# Patient Record
Sex: Male | Born: 1950 | Race: White | Hispanic: No | Marital: Married | Smoking: Never smoker
Health system: Southern US, Community
[De-identification: ages and names within clinical notes are randomized; demographics above are authoritative.]

## PROBLEM LIST (undated history)

## (undated) ENCOUNTER — Emergency Department (HOSPITAL_COMMUNITY): Admission: EM | Payer: Medicare Other | Source: Home / Self Care

## (undated) DIAGNOSIS — I2510 Atherosclerotic heart disease of native coronary artery without angina pectoris: Secondary | ICD-10-CM

## (undated) DIAGNOSIS — C9000 Multiple myeloma not having achieved remission: Secondary | ICD-10-CM

## (undated) DIAGNOSIS — N400 Enlarged prostate without lower urinary tract symptoms: Secondary | ICD-10-CM

## (undated) DIAGNOSIS — I2699 Other pulmonary embolism without acute cor pulmonale: Secondary | ICD-10-CM

## (undated) DIAGNOSIS — E785 Hyperlipidemia, unspecified: Secondary | ICD-10-CM

## (undated) DIAGNOSIS — C439 Malignant melanoma of skin, unspecified: Secondary | ICD-10-CM

## (undated) HISTORY — PX: CARDIAC CATHETERIZATION: SHX172

## (undated) HISTORY — PX: HAND SURGERY: SHX662

## (undated) HISTORY — DX: Atherosclerotic heart disease of native coronary artery without angina pectoris: I25.10

---

## 2003-04-04 ENCOUNTER — Ambulatory Visit (HOSPITAL_COMMUNITY): Admission: RE | Admit: 2003-04-04 | Discharge: 2003-04-04 | Payer: Self-pay | Admitting: Internal Medicine

## 2003-04-04 ENCOUNTER — Encounter: Payer: Self-pay | Admitting: Internal Medicine

## 2006-08-29 ENCOUNTER — Encounter: Admission: RE | Admit: 2006-08-29 | Discharge: 2006-08-29 | Payer: Self-pay | Admitting: Gastroenterology

## 2008-11-15 ENCOUNTER — Ambulatory Visit: Payer: Self-pay | Admitting: Family Medicine

## 2008-11-15 DIAGNOSIS — Z85828 Personal history of other malignant neoplasm of skin: Secondary | ICD-10-CM

## 2008-11-17 ENCOUNTER — Ambulatory Visit: Payer: Self-pay | Admitting: Internal Medicine

## 2008-11-24 ENCOUNTER — Ambulatory Visit: Payer: Self-pay | Admitting: Internal Medicine

## 2008-11-24 LAB — HM COLONOSCOPY: HM Colonoscopy: NORMAL

## 2010-12-30 LAB — CONVERTED CEMR LAB
AST: 42 units/L — ABNORMAL HIGH (ref 0–37)
Alkaline Phosphatase: 78 units/L (ref 39–117)
Basophils Absolute: 0.1 10*3/uL (ref 0.0–0.1)
Bilirubin, Direct: 0.1 mg/dL (ref 0.0–0.3)
Blood in Urine, dipstick: NEGATIVE
CO2: 31 meq/L (ref 19–32)
Chloride: 105 meq/L (ref 96–112)
Eosinophils Absolute: 0.1 10*3/uL (ref 0.0–0.7)
Eosinophils Relative: 1.3 % (ref 0.0–5.0)
GFR calc non Af Amer: 73 mL/min
Glucose, Urine, Semiquant: NEGATIVE
HDL: 37.3 mg/dL — ABNORMAL LOW (ref >39.0)
LDL Cholesterol: 117 mg/dL — ABNORMAL HIGH (ref 0–99)
Lymphocytes Relative: 27.2 % (ref 12.0–46.0)
MCHC: 34.7 g/dL (ref 30.0–36.0)
MCV: 92.6 fL (ref 78.0–100.0)
Neutrophils Relative %: 61.9 % (ref 43.0–77.0)
Platelets: 234 10*3/uL (ref 150–400)
Potassium: 3.7 meq/L (ref 3.5–5.1)
Protein, U semiquant: NEGATIVE
Total Bilirubin: 1 mg/dL (ref 0.3–1.2)
Urobilinogen, UA: 0.2
VLDL: 38 mg/dL (ref 0–40)
WBC Urine, dipstick: NEGATIVE
WBC: 7.5 10*3/uL (ref 4.5–10.5)
pH: 5.5

## 2011-12-03 DIAGNOSIS — K922 Gastrointestinal hemorrhage, unspecified: Secondary | ICD-10-CM

## 2011-12-03 HISTORY — DX: Gastrointestinal hemorrhage, unspecified: K92.2

## 2012-09-23 ENCOUNTER — Emergency Department (HOSPITAL_COMMUNITY): Payer: BC Managed Care – PPO

## 2012-09-23 ENCOUNTER — Encounter (HOSPITAL_COMMUNITY): Payer: Self-pay | Admitting: Emergency Medicine

## 2012-09-23 ENCOUNTER — Inpatient Hospital Stay (HOSPITAL_COMMUNITY)
Admission: EM | Admit: 2012-09-23 | Discharge: 2012-09-26 | DRG: 175 | Disposition: A | Discharge status: Home / Self Care | Payer: BC Managed Care – PPO | Attending: Internal Medicine | Admitting: Internal Medicine

## 2012-09-23 DIAGNOSIS — Z7982 Long term (current) use of aspirin: Secondary | ICD-10-CM

## 2012-09-23 DIAGNOSIS — K449 Diaphragmatic hernia without obstruction or gangrene: Secondary | ICD-10-CM | POA: Diagnosis present

## 2012-09-23 DIAGNOSIS — Z85828 Personal history of other malignant neoplasm of skin: Secondary | ICD-10-CM

## 2012-09-23 DIAGNOSIS — R42 Dizziness and giddiness: Secondary | ICD-10-CM | POA: Diagnosis present

## 2012-09-23 DIAGNOSIS — A048 Other specified bacterial intestinal infections: Secondary | ICD-10-CM | POA: Diagnosis present

## 2012-09-23 DIAGNOSIS — Z720 Tobacco use: Secondary | ICD-10-CM

## 2012-09-23 DIAGNOSIS — K254 Chronic or unspecified gastric ulcer with hemorrhage: Principal | ICD-10-CM | POA: Diagnosis present

## 2012-09-23 DIAGNOSIS — F17200 Nicotine dependence, unspecified, uncomplicated: Secondary | ICD-10-CM | POA: Diagnosis present

## 2012-09-23 DIAGNOSIS — R7309 Other abnormal glucose: Secondary | ICD-10-CM | POA: Diagnosis present

## 2012-09-23 DIAGNOSIS — Z23 Encounter for immunization: Secondary | ICD-10-CM

## 2012-09-23 DIAGNOSIS — K922 Gastrointestinal hemorrhage, unspecified: Secondary | ICD-10-CM

## 2012-09-23 DIAGNOSIS — D62 Acute posthemorrhagic anemia: Secondary | ICD-10-CM | POA: Diagnosis present

## 2012-09-23 DIAGNOSIS — Z85820 Personal history of malignant melanoma of skin: Secondary | ICD-10-CM

## 2012-09-23 DIAGNOSIS — D649 Anemia, unspecified: Secondary | ICD-10-CM | POA: Diagnosis not present

## 2012-09-23 HISTORY — DX: Malignant melanoma of skin, unspecified: C43.9

## 2012-09-23 LAB — CBC WITH DIFFERENTIAL/PLATELET
Eosinophils Absolute: 0 10*3/uL (ref 0.0–0.7)
Eosinophils Relative: 0 % (ref 0–5)
Hemoglobin: 12.5 g/dL — ABNORMAL LOW (ref 13.0–17.0)
Lymphocytes Relative: 13 % (ref 12–46)
Lymphs Abs: 2.3 10*3/uL (ref 0.7–4.0)
MCH: 30 pg (ref 26.0–34.0)
MCV: 88.7 fL (ref 78.0–100.0)
Monocytes Relative: 7 % (ref 3–12)
Neutrophils Relative %: 80 % — ABNORMAL HIGH (ref 43–77)
RBC: 4.17 MIL/uL — ABNORMAL LOW (ref 4.22–5.81)
WBC: 17.8 10*3/uL — ABNORMAL HIGH (ref 4.0–10.5)

## 2012-09-23 LAB — PROTIME-INR: INR: 1.15 (ref 0.00–1.49)

## 2012-09-23 MED ORDER — SODIUM CHLORIDE 0.9 % IV BOLUS (SEPSIS)
1000.0000 mL | Freq: Once | INTRAVENOUS | Status: AC
  Administered 2012-09-23: 1000 mL via INTRAVENOUS

## 2012-09-23 MED ORDER — SODIUM CHLORIDE 0.9 % IV SOLN
80.0000 mg | Freq: Once | INTRAVENOUS | Status: AC
  Administered 2012-09-24: 80 mg via INTRAVENOUS
  Filled 2012-09-23: qty 80

## 2012-09-23 NOTE — ED Notes (Signed)
PT. REPORTS BLOODY EMESIS YESTERDAY AND DARK STOOLS TODAY / PALE , DENIES ABDOMINAL PAIN .

## 2012-09-23 NOTE — ED Notes (Signed)
Dr. Verl Bangs at bedside assessing patient.

## 2012-09-23 NOTE — ED Notes (Signed)
Waiting on Protonix to be delivered from pharmacy.

## 2012-09-23 NOTE — ED Provider Notes (Addendum)
History     CSN: [id number]  Arrival date & time 09/23/12  2303   First MD Initiated Contact with Patient 09/23/12 2314      Chief Complaint  Patient presents with  . Hematemesis    (Consider location/radiation/quality/duration/timing/severity/associated sxs/prior treatment) Patient is a 61 y.o. male presenting with GI illness and vomiting.  GI Problem  This is a new problem. The current episode started yesterday. The problem occurs 2 to 4 times per day. The problem has not changed since onset.There has been no fever. Associated symptoms include abdominal pain and vomiting. He has tried nothing for the symptoms.  Emesis  This is a new problem. The current episode started yesterday. The problem occurs 2 to 4 times per day. Vomiting appearance: black,  coffee grounds. There has been no fever. Associated symptoms include abdominal pain. Associated symptoms comments: Black tarry stool.    History reviewed. No pertinent past medical history.  History reviewed. No pertinent past surgical history.  No family history on file.  History  Substance Use Topics  . Smoking status: Never Smoker   . Smokeless tobacco: Not on file  . Alcohol Use: No      Review of Systems  Gastrointestinal: Positive for vomiting, abdominal pain and blood in stool.  Neurological: Positive for dizziness and light-headedness.  All other systems reviewed and are negative.    Allergies  Review of patient's allergies indicates no known allergies.  Home Medications  No current outpatient prescriptions on file.  BP 100/60  Pulse 91  Resp 18  SpO2 94%  Physical Exam  Constitutional: He is oriented to person, place, and time. He appears well-developed and well-nourished.  HENT:  Head: Normocephalic and atraumatic.       Coffee grounds noted in mouth  Eyes: Pupils are equal, round, and reactive to light.       Conjunctiva pale  Neck: Normal range of motion. Neck supple.  Cardiovascular: Regular  rhythm, normal heart sounds and intact distal pulses.  Tachycardia present.   Pulmonary/Chest: Effort normal and breath sounds normal.  Abdominal: Soft. Bowel sounds are normal.  Neurological: He is alert and oriented to person, place, and time.  Skin: Skin is warm and dry.  Psychiatric: He has a normal mood and affect. His behavior is normal. Judgment and thought content normal.    ED Course  Procedures (including critical care time)   Labs Reviewed  TYPE AND SCREEN  CBC WITH DIFFERENTIAL  COMPREHENSIVE METABOLIC PANEL  TROPONIN I  APTT  PROTIME-INR   No results found.   No diagnosis found.   Date: 09/24/2012  Rate: 87  Rhythm: normal sinus rhythm  QRS Axis: normal  Intervals: normal  ST/T Wave abnormalities: nonspecific ST changes  Conduction Disutrbances:none  Narrative Interpretation:   Old EKG Reviewed: none available    MDM  Pt tachycardic ,  Hypotensive.  Suspect upper gi bleed.  Will lab,  reassess   bp improved.  Some prerenal azotemia.  No abd pain.  No free air on cxr.  Discussed with hospitalist,  Will admit     Rosanne Ashing, MD 09/23/12 2332  Elric Tirado Lytle Michaels, MD 09/24/12 4098

## 2012-09-24 ENCOUNTER — Encounter (HOSPITAL_COMMUNITY): Payer: Self-pay | Admitting: Internal Medicine

## 2012-09-24 ENCOUNTER — Encounter (HOSPITAL_COMMUNITY)
Admission: EM | Disposition: A | Discharge status: Home / Self Care | Payer: Self-pay | Source: Home / Self Care | Attending: Internal Medicine

## 2012-09-24 DIAGNOSIS — K922 Gastrointestinal hemorrhage, unspecified: Secondary | ICD-10-CM

## 2012-09-24 DIAGNOSIS — D649 Anemia, unspecified: Secondary | ICD-10-CM | POA: Diagnosis not present

## 2012-09-24 DIAGNOSIS — Z720 Tobacco use: Secondary | ICD-10-CM | POA: Diagnosis present

## 2012-09-24 DIAGNOSIS — F17200 Nicotine dependence, unspecified, uncomplicated: Secondary | ICD-10-CM

## 2012-09-24 HISTORY — PX: ESOPHAGOGASTRODUODENOSCOPY: SHX5428

## 2012-09-24 LAB — CBC
HCT: 34 % — ABNORMAL LOW (ref 39.0–52.0)
Hemoglobin: 11.4 g/dL — ABNORMAL LOW (ref 13.0–17.0)
MCH: 30 pg (ref 26.0–34.0)
MCH: 29.9 pg (ref 26.0–34.0)
MCH: 30.7 pg (ref 26.0–34.0)
MCHC: 33.5 g/dL (ref 30.0–36.0)
MCHC: 33.5 g/dL (ref 30.0–36.0)
MCHC: 34 g/dL (ref 30.0–36.0)
MCV: 89.5 fL (ref 78.0–100.0)
MCV: 89.3 fL (ref 78.0–100.0)
Platelets: 206 10*3/uL (ref 150–400)
Platelets: 208 10*3/uL (ref 150–400)
Platelets: 201 10*3/uL (ref 150–400)
RBC: 3.8 MIL/uL — ABNORMAL LOW (ref 4.22–5.81)
RBC: 3.62 MIL/uL — ABNORMAL LOW (ref 4.22–5.81)
RDW: 13.3 % (ref 11.5–15.5)
RDW: 13.3 % (ref 11.5–15.5)
RDW: 13.4 % (ref 11.5–15.5)
WBC: 15.2 10*3/uL — ABNORMAL HIGH (ref 4.0–10.5)
WBC: 18 10*3/uL — ABNORMAL HIGH (ref 4.0–10.5)

## 2012-09-24 LAB — COMPREHENSIVE METABOLIC PANEL
ALT: 19 U/L (ref 0–53)
ALT: 17 U/L (ref 0–53)
AST: 15 U/L (ref 0–37)
Albumin: 2.9 g/dL — ABNORMAL LOW (ref 3.5–5.2)
Alkaline Phosphatase: 76 U/L (ref 39–117)
BUN: 32 mg/dL — ABNORMAL HIGH (ref 6–23)
CO2: 25 mEq/L (ref 19–32)
CO2: 24 mEq/L (ref 19–32)
Chloride: 109 mEq/L (ref 96–112)
GFR calc Af Amer: 82 mL/min — ABNORMAL LOW (ref >90)
GFR calc non Af Amer: 70 mL/min — ABNORMAL LOW (ref >90)
GFR calc non Af Amer: 88 mL/min — ABNORMAL LOW (ref >90)
Glucose, Bld: 269 mg/dL — ABNORMAL HIGH (ref 70–99)
Potassium: 4.1 mEq/L (ref 3.5–5.1)
Sodium: 142 mEq/L (ref 135–145)
Total Bilirubin: 0.4 mg/dL (ref 0.3–1.2)
Total Protein: 5.9 g/dL — ABNORMAL LOW (ref 6.0–8.3)

## 2012-09-24 LAB — TROPONIN I: Troponin I: <0.3 ng/mL (ref <0.30)

## 2012-09-24 LAB — TYPE AND SCREEN: Antibody Screen: NEGATIVE

## 2012-09-24 LAB — HEMOGLOBIN A1C: Hgb A1c MFr Bld: 5.3 % (ref <5.7)

## 2012-09-24 LAB — PROTIME-INR: INR: 1.16 (ref 0.00–1.49)

## 2012-09-24 SURGERY — EGD (ESOPHAGOGASTRODUODENOSCOPY)
Anesthesia: Moderate Sedation

## 2012-09-24 MED ORDER — SODIUM CHLORIDE 0.9 % IJ SOLN
3.0000 mL | Freq: Two times a day (BID) | INTRAMUSCULAR | Status: DC
  Administered 2012-09-24 – 2012-09-25 (×3): 3 mL via INTRAVENOUS

## 2012-09-24 MED ORDER — SODIUM CHLORIDE 0.9 % IJ SOLN
INTRAMUSCULAR | Status: DC | PRN
  Administered 2012-09-24: 13:00:00

## 2012-09-24 MED ORDER — FENTANYL CITRATE 0.05 MG/ML IJ SOLN
INTRAMUSCULAR | Status: DC | PRN
  Administered 2012-09-24 (×3): 25 ug via INTRAVENOUS

## 2012-09-24 MED ORDER — SODIUM CHLORIDE 0.9 % IV SOLN
INTRAVENOUS | Status: DC
  Administered 2012-09-24 – 2012-09-25 (×3): via INTRAVENOUS

## 2012-09-24 MED ORDER — MIDAZOLAM HCL 10 MG/2ML IJ SOLN
INTRAMUSCULAR | Status: DC | PRN
  Administered 2012-09-24 (×2): 2 mg via INTRAVENOUS
  Administered 2012-09-24: 1 mg via INTRAVENOUS

## 2012-09-24 MED ORDER — ONDANSETRON HCL 4 MG/2ML IJ SOLN: 4.0000 mg | Freq: Three times a day (TID) | INTRAMUSCULAR | Status: DC | PRN

## 2012-09-24 MED ORDER — INFLUENZA VIRUS VACC SPLIT PF IM SUSP
0.5000 mL | INTRAMUSCULAR | Status: AC
  Administered 2012-09-25: 0.5 mL via INTRAMUSCULAR
  Filled 2012-09-24: qty 0.5

## 2012-09-24 MED ORDER — ONDANSETRON HCL 4 MG/2ML IJ SOLN: 4.0000 mg | Freq: Four times a day (QID) | INTRAMUSCULAR | Status: DC | PRN

## 2012-09-24 MED ORDER — EPINEPHRINE HCL 0.1 MG/ML IJ SOLN
INTRAMUSCULAR | Status: AC
  Filled 2012-09-24: qty 10

## 2012-09-24 MED ORDER — BUTAMBEN-TETRACAINE-BENZOCAINE 2-2-14 % EX AERO
INHALATION_SPRAY | CUTANEOUS | Status: DC | PRN
  Administered 2012-09-24: 1 via TOPICAL

## 2012-09-24 MED ORDER — DIPHENHYDRAMINE HCL 50 MG/ML IJ SOLN
INTRAMUSCULAR | Status: DC | PRN
  Administered 2012-09-24: 12.5 mg via INTRAVENOUS
  Administered 2012-09-24: 25 mg via INTRAVENOUS

## 2012-09-24 MED ORDER — ACETAMINOPHEN 325 MG PO TABS: 650.0000 mg | ORAL_TABLET | Freq: Four times a day (QID) | ORAL | Status: DC | PRN

## 2012-09-24 MED ORDER — ACETAMINOPHEN 650 MG RE SUPP: 650.0000 mg | Freq: Four times a day (QID) | RECTAL | Status: DC | PRN

## 2012-09-24 MED ORDER — MIDAZOLAM HCL 5 MG/ML IJ SOLN
INTRAMUSCULAR | Status: AC
  Filled 2012-09-24: qty 2

## 2012-09-24 MED ORDER — ONDANSETRON HCL 4 MG PO TABS: 4.0000 mg | ORAL_TABLET | Freq: Four times a day (QID) | ORAL | Status: DC | PRN

## 2012-09-24 MED ORDER — FENTANYL CITRATE 0.05 MG/ML IJ SOLN
INTRAMUSCULAR | Status: AC
  Filled 2012-09-24: qty 2

## 2012-09-24 MED ORDER — DIPHENHYDRAMINE HCL 50 MG/ML IJ SOLN
INTRAMUSCULAR | Status: AC
  Filled 2012-09-24: qty 1

## 2012-09-24 MED ORDER — SODIUM CHLORIDE 0.9 % IV SOLN
8.0000 mg/h | INTRAVENOUS | Status: DC
  Administered 2012-09-24 – 2012-09-26 (×4): 8 mg/h via INTRAVENOUS
  Filled 2012-09-24 (×12): qty 80

## 2012-09-24 MED ORDER — SODIUM CHLORIDE 0.9 % IV SOLN: INTRAVENOUS | Status: DC

## 2012-09-24 NOTE — ED Notes (Signed)
Dr. Toniann Fail at bedside assessing patient.

## 2012-09-24 NOTE — Interval H&P Note (Signed)
History and Physical Interval Note:  09/24/2012 12:37 PM  John Mccarthy  has presented today for surgery, with the diagnosis of gi bleed  The various methods of treatment have been discussed with the patient and family. After consideration of risks, benefits and other options for treatment, the patient has consented to  Procedure(s) (LRB) with comments: ESOPHAGOGASTRODUODENOSCOPY (EGD) (N/A) as a surgical intervention .  The patient's history has been reviewed, patient examined, no change in status, stable for surgery.  I have reviewed the patient's chart and labs.  Questions were answered to the patient's satisfaction.     Genell Thede C.

## 2012-09-24 NOTE — Brief Op Note (Addendum)
Large Antral Ulcer. See endopro for details. NG removed and would not reinsert. Continue Protonix infusion. Clear liquids.

## 2012-09-24 NOTE — H&P (Addendum)
John Mccarthy is an 61 y.o. male.  Patient was seen and examined on September 24, 2012. PCP - Dr. Kelle Darting.  Chief Complaint: Vomiting blood. HPI: 61 year-old male with history of melanoma and tobacco abuse presented to the ER because of vomiting of blood. Patient started developing some epigastric discomfort last evening around 8 PM while at home after which he threw up and it was coffee-ground. He came to the ER and in the ER NG tube suction shows frank blood. Patient also had a melanotic bowel movement in the ER. Denies any abdominal pain at this time. Patient has been admitted for further management of his acute GI bleed. Patient denies using any NSAIDs or over-the-counter medications. Denies having GI bleed previously but has had colonoscopy and EGD 3-4 years ago for a routine checkup. Patient otherwise denies any chest pain or shortness of breath.  Patient initially was mildly hypotensive and blood pressure improved with fluids.  Past Medical History  Diagnosis Date  . Melanoma     Past Surgical History  Procedure Date  . Hand surgery     Family History  Problem Relation Age of Onset  . Heart failure Father    Social History:  reports that he has been smoking.  He does not have any smokeless tobacco history on file. He reports that he drinks alcohol. He reports that he does not use illicit drugs.  Allergies: No Known Allergies   (Not in a hospital admission)  Results for orders placed during the hospital encounter of 09/23/12 (from the past 48 hour(s))  CBC WITH DIFFERENTIAL     Status: Abnormal   Collection Time   09/23/12 11:24 PM      Component Value Range Comment   WBC 17.8 (*) 4.0 - 10.5 K/uL    RBC 4.17 (*) 4.22 - 5.81 MIL/uL    Hemoglobin 12.5 (*) 13.0 - 17.0 g/dL    HCT 40.9 (*) 81.1 - 52.0 %    MCV 88.7  78.0 - 100.0 fL    MCH 30.0  26.0 - 34.0 pg    MCHC 33.8  30.0 - 36.0 g/dL    RDW 91.4  78.2 - 95.6 %    Platelets 242  150 - 400 K/uL    Neutrophils  Relative 80 (*) 43 - 77 %    Neutro Abs 14.2 (*) 1.7 - 7.7 K/uL    Lymphocytes Relative 13  12 - 46 %    Lymphs Abs 2.3  0.7 - 4.0 K/uL    Monocytes Relative 7  3 - 12 %    Monocytes Absolute 1.2 (*) 0.1 - 1.0 K/uL    Eosinophils Relative 0  0 - 5 %    Eosinophils Absolute 0.0  0.0 - 0.7 K/uL    Basophils Relative 0  0 - 1 %    Basophils Absolute 0.0  0.0 - 0.1 K/uL   COMPREHENSIVE METABOLIC PANEL     Status: Abnormal   Collection Time   09/23/12 11:24 PM      Component Value Range Comment   Sodium 141  135 - 145 mEq/L    Potassium 4.1  3.5 - 5.1 mEq/L    Chloride 101  96 - 112 mEq/L    CO2 25  19 - 32 mEq/L    Glucose, Bld 269 (*) 70 - 99 mg/dL    BUN 32 (*) 6 - 23 mg/dL    Creatinine, Ser 2.13  0.50 - 1.35 mg/dL  Calcium 8.5  8.4 - 10.5 mg/dL    Total Protein 5.9 (*) 6.0 - 8.3 g/dL    Albumin 3.1 (*) 3.5 - 5.2 g/dL    AST 19  0 - 37 U/L    ALT 19  0 - 53 U/L    Alkaline Phosphatase 76  39 - 117 U/L    Total Bilirubin 0.3  0.3 - 1.2 mg/dL    GFR calc non Af Amer 70 (*) >90 mL/min    GFR calc Af Amer 82 (*) >90 mL/min   TROPONIN I     Status: Normal   Collection Time   09/23/12 11:25 PM      Component Value Range Comment   Troponin I <0.30  <0.30 ng/mL   APTT     Status: Normal   Collection Time   09/23/12 11:25 PM      Component Value Range Comment   aPTT 24  24 - 37 seconds   PROTIME-INR     Status: Normal   Collection Time   09/23/12 11:25 PM      Component Value Range Comment   Prothrombin Time 14.5  11.6 - 15.2 seconds    INR 1.15  0.00 - 1.49   TYPE AND SCREEN     Status: Normal   Collection Time   09/23/12 11:30 PM      Component Value Range Comment   ABO/RH(D) A POS      Antibody Screen NEG      Sample Expiration 09/26/2012     ABO/RH     Status: Normal   Collection Time   09/23/12 11:30 PM      Component Value Range Comment   ABO/RH(D) A POS     CBC     Status: Abnormal   Collection Time   09/24/12  1:08 AM      Component Value Range Comment    WBC 17.7 (*) 4.0 - 10.5 K/uL    RBC 3.80 (*) 4.22 - 5.81 MIL/uL    Hemoglobin 11.4 (*) 13.0 - 17.0 g/dL    HCT 16.1 (*) 09.6 - 52.0 %    MCV 89.5  78.0 - 100.0 fL    MCH 30.0  26.0 - 34.0 pg    MCHC 33.5  30.0 - 36.0 g/dL    RDW 04.5  40.9 - 81.1 %    Platelets 201  150 - 400 K/uL    Dg Chest Port 1 View  09/24/2012  *RADIOLOGY REPORT*  Clinical Data: Hematemesis.  PORTABLE CHEST - 1 VIEW  Comparison: None.  Findings: Single view of the chest demonstrates elevation of the right hemidiaphragm.  There may be right basilar atelectasis. Upper lungs are clear.  Heart and mediastinum are within normal limits.  Trachea is midline.  IMPRESSION: Elevation of the right hemidiaphragm.  No focal airspace disease.   Original Report Authenticated By: Richarda Overlie, M.D.     Review of Systems  Constitutional: Negative.   HENT: Negative.   Eyes: Negative.   Respiratory: Negative.   Cardiovascular: Negative.   Gastrointestinal: Positive for vomiting and abdominal pain.       Throwing up blood.  Genitourinary: Negative.   Musculoskeletal: Negative.   Skin: Negative.   Neurological: Negative.   Endo/Heme/Allergies: Negative.   Psychiatric/Behavioral: Negative.     Blood pressure 111/65, pulse 97, temperature 98.8 F (37.1 C), temperature source Oral, resp. rate 20, SpO2 98.00%. Physical Exam  Constitutional: He is oriented to person, place, and time. He  appears well-developed and well-nourished. No distress.  HENT:  Head: Normocephalic and atraumatic.  Right Ear: External ear normal.  Left Ear: External ear normal.  Nose: Nose normal.  Mouth/Throat: Oropharynx is clear and moist. No oropharyngeal exudate.  Eyes: Conjunctivae normal are normal. Pupils are equal, round, and reactive to light. Right eye exhibits no discharge. Left eye exhibits no discharge. No scleral icterus.  Neck: Normal range of motion. Neck supple.  Cardiovascular: Normal rate and regular rhythm.   Respiratory: Effort normal  and breath sounds normal. No respiratory distress. He has no wheezes. He has no rales.  GI: Soft. Bowel sounds are normal. He exhibits no distension. There is no tenderness. There is no rebound and no guarding.  Musculoskeletal: Normal range of motion. He exhibits no edema and no tenderness.  Neurological: He is alert and oriented to person, place, and time.       Moves all extremities.  Skin: Skin is warm and dry. He is not diaphoretic.     Assessment/Plan #1. Acute GI bleed - given the symptoms it is mostly upper GI. Patient will be kept n.p.o. and continued on Protonix infusion. Repeat hemoglobin only shows 1 g drop. Closely follow CBC and transfuse as needed. Consult GI. #2. Leukocytosis - patient is afebrile. Closely follow CBC. Leukocytosis probably from stress. #3. Hyperglycemia - check hemoglobin A1c. #4. Tobacco abuse - tobacco cessation counseling requested.  CODE STATUS - full code.  Nasiir Monts N. 09/24/2012, 1:28 AM

## 2012-09-24 NOTE — H&P (View-Only) (Signed)
[facility] PROGRESS NOTE  John Mccarthy EXB:[id number] DOB: [birth year] DOA: 09/23/2012 PCP: Evette Georges, MD  Assessment/Plan: Principal Problem:  *Acute GI bleeding    1. Acute GI bleed: Patient presented with hematemesis/melena, a few hours after feeling epigastric discomfort. Clinically, this is consistent wiith upper GI bleed, with peptic ulcer disease being high on the list of etiologies, although other lesions are possible. Patient is NPO, following serial CBCs, on iv Protonix. Dr Charlott Rakes provided GI consultation, and EGD is planned this AM.  2. Anemia: This is due to acute blood loss, although it may be partly dilutional, due to iv fluids. Patient's hemoglobin was 12.5 at presentation, and is 11.2 this AM. Following CBCs as described above, and will transfuse PRN.  3. Leukocytosis: Likely reactive. Patient is afebrile and has no foci of infection.  4. Hyperglycemia: random glucose was 269 at presentation, but has already normalized at 116 this AM. . Patient has no known history of DM Suspect stress reaction. Will follow CBGs. Hemoglobin A1C is pending.  5. Tobacco abuse: Counseled.    Code Status: Full Code.  Family Communication:  Disposition Plan: To be determined.    Brief narrative: 61 year-old male with history of melanoma and tobacco abuse presenting with hematemesis. Patient developed epigastric discomfort at around 8:00 PM on 09/13/12 at home, after which he threw up and it was coffee-grounds. He came to the ED, where NG tube suction revealed frank blood. Patient also had a melenic bowel movement in the ED. Denied abdominal pain or NSAIDs use. Denies having GI bleed previously but has had colonoscopy and EGD 3-4 years ago for a routine checkup.   Consultants:  Dr Charlott Rakes, GI.   Procedures:  EGD.   Antibiotics:  N/A.   HPI/Subjective: Asymptomatic.   Objective: Vital signs in last 24 hours: Temp:  [98.2 F (36.8 C)-98.8  F (37.1 C)] 98.5 F (36.9 C) (10/24 0736) Pulse Rate:  [89-102] 94  (10/24 0800) Resp:  [17-28] 17  (10/24 0800) BP: (83-133)/(41-68) 127/61 mmHg (10/24 0800) SpO2:  [94 %-99 %] 95 % (10/24 0800) Weight:  [95.3 kg (210 lb 1.6 oz)] 95.3 kg (210 lb 1.6 oz) (10/24 0300) Weight change:     Intake/Output from previous day: 10/23 0701 - 10/24 0700 In: 711.3 [I.V.:711.3] Out: 251 [Urine:1; Emesis/NG output:250] Total I/O In: 150 [I.V.:150] Out: -    Physical Exam: General: Comfortable, alert, communicative, fully oriented, not short of breath at rest.  HEENT:  Mild clinical pallor, no jaundice, no conjunctival injection or discharge. Hydration is fair.  NECK:  Supple, JVP not seen, no carotid bruits, no palpable lymphadenopathy, no palpable goiter. CHEST:  Clinically clear to auscultation, no wheezes, no crackles. HEART:  Sounds 1 and 2 heard, normal, regular, no murmurs. ABDOMEN:  Full, soft, non-tender, no palpable organomegaly, no palpable masses, normal bowel sounds. GENITALIA:  Not examined. LOWER EXTREMITIES:  No pitting edema, palpable peripheral pulses. MUSCULOSKELETAL SYSTEM:  Unremarkable.  CENTRAL NERVOUS SYSTEM:  No focal neurologic deficit on gross examination.  Lab Results:  Basename 09/24/12 0600 09/24/12 0108  WBC 15.2* 17.7*  HGB 11.2* 11.4*  HCT 33.4* 34.0*  PLT 206 201    Basename 09/24/12 0600 09/23/12 2324  NA 142 141  K 4.2 4.1  CL 109 101  CO2 24 25  GLUCOSE 116* 269*  BUN 38* 32*  CREATININE 0.93 1.09  CALCIUM 7.9* 8.5   Recent Results (from the past 240 hour(s))  MRSA PCR SCREENING  Status: Normal   Collection Time   09/24/12  2:08 AM      Component Value Range Status Comment   MRSA by PCR NEGATIVE  NEGATIVE Final      Studies/Results: Dg Chest Port 1 View  09/24/2012  *RADIOLOGY REPORT*  Clinical Data: Hematemesis.  PORTABLE CHEST - 1 VIEW  Comparison: None.  Findings: Single view of the chest demonstrates elevation of the right  hemidiaphragm.  There may be right basilar atelectasis. Upper lungs are clear.  Heart and mediastinum are within normal limits.  Trachea is midline.  IMPRESSION: Elevation of the right hemidiaphragm.  No focal airspace disease.   Original Report Authenticated By: Richarda Overlie, M.D.     Medications: Scheduled Meds:   . pantoprazole (PROTONIX) IV  80 mg Intravenous Once  . sodium chloride  1,000 mL Intravenous Once  . sodium chloride  3 mL Intravenous Q12H  . DISCONTD: sodium chloride   Intravenous STAT   Continuous Infusions:   . sodium chloride 125 mL/hr at 09/24/12 0700  . pantoprozole (PROTONIX) infusion 8 mg/hr (09/24/12 0800)   PRN Meds:.acetaminophen, acetaminophen, ondansetron (ZOFRAN) IV, ondansetron, DISCONTD: ondansetron (ZOFRAN) IV    LOS: 1 day   Alishia Lebo,CHRISTOPHER  [facility] Pager [phone]. If 8PM-8AM, please contact night-coverage at [email or web address], password [email or web address] 09/24/2012, 9:07 AM  LOS: 1 day

## 2012-09-24 NOTE — Consult Note (Signed)
Referring Provider: Dr. Toniann Fail Primary Care Physician:  Evette Georges, MD Primary Gastroenterologist:  Dr. Bosie Clos  Reason for Consultation:  Hematemesis  HPI: John Mccarthy is a 61 y.o. male reports 2 days of an upset stomach followed by 3 episodes of dark coffee grounds emesis yesterday and one episode of melena. Felt dizzy and lightheaded at home. Denies bright red blood emesis. Rare aspirin and otherwise denies NSAIDs. Occasional alcohol during special occasions and denies any recently. No history of ulcers. EGD in 2007 unremarkable (by me). Diverticulosis on colonoscopy by Dr. Juanda Chance in 2009. Denies dizziness walking to bathroom after admit. Hgb 11.2. Approximately 250cc from NG tube since admit to room of black fluid. 200cc-300cc out in ER of blood (?appearance).    Past Medical History  Diagnosis Date  . Melanoma     Past Surgical History  Procedure Date  . Hand surgery     Prior to Admission medications   Medication Sig Start Date End Date Taking? Authorizing Provider  aspirin 325 MG tablet Take 325 mg by mouth daily as needed. For pain   Yes Historical Provider, MD    Scheduled Meds:   . pantoprazole (PROTONIX) IV  80 mg Intravenous Once  . sodium chloride  1,000 mL Intravenous Once  . sodium chloride  3 mL Intravenous Q12H  . DISCONTD: sodium chloride   Intravenous STAT   Continuous Infusions:   . sodium chloride 125 mL/hr at 09/24/12 0700  . pantoprozole (PROTONIX) infusion 8 mg/hr (09/24/12 0800)   PRN Meds:.acetaminophen, acetaminophen, ondansetron (ZOFRAN) IV, ondansetron, DISCONTD: ondansetron (ZOFRAN) IV  Allergies as of 09/23/2012  . (No Known Allergies)    Family History  Problem Relation Age of Onset  . Heart failure Father     History   Social History  . Marital Status: Married    Spouse Name: N/A    Number of Children: N/A  . Years of Education: N/A   Occupational History  . Not on file.   Social History Main Topics  .  Smoking status: Current Some Day Smoker  . Smokeless tobacco: Not on file  . Alcohol Use: Yes  . Drug Use: No  . Sexually Active:    Other Topics Concern  . Not on file   Social History Narrative  . No narrative on file    Review of Systems: All negative except as stated above in HPI.  Physical Exam: Vital signs: Filed Vitals:   09/24/12 0800  BP: 127/61  Pulse: 94  Temp: 98.5  Resp: 17     General:   Alert,  Well-developed, well-nourished, pleasant and cooperative in NAD, NG with wall suction HEENT: no icterus Neck: supple, nontender Lungs:  Clear throughout to auscultation.   No wheezes, crackles, or rhonchi. No acute distress. Heart:  Regular rate and rhythm; no murmurs, clicks, rubs,  or gallops. Abdomen: soft, NT, ND  Rectal:  Deferred Ext: no edema  GI:  Lab Results:  Basename 09/24/12 0600 09/24/12 0108 09/23/12 2324  WBC 15.2* 17.7* 17.8*  HGB 11.2* 11.4* 12.5*  HCT 33.4* 34.0* 37.0*  PLT 206 201 242   BMET  Basename 09/24/12 0600 09/23/12 2324  NA 142 141  K 4.2 4.1  CL 109 101  CO2 24 25  GLUCOSE 116* 269*  BUN 38* 32*  CREATININE 0.93 1.09  CALCIUM 7.9* 8.5   LFT  Basename 09/24/12 0600  PROT 5.5*  ALBUMIN 2.9*  AST 15  ALT 17  ALKPHOS 68  BILITOT 0.4  BILIDIR --  IBILI --   PT/INR  Basename 09/24/12 0600 09/23/12 2325  LABPROT 14.6 14.5  INR 1.16 1.15     Studies/Results: Dg Chest Port 1 View  09/24/2012  *RADIOLOGY REPORT*  Clinical Data: Hematemesis.  PORTABLE CHEST - 1 VIEW  Comparison: None.  Findings: Single view of the chest demonstrates elevation of the right hemidiaphragm.  There may be right basilar atelectasis. Upper lungs are clear.  Heart and mediastinum are within normal limits.  Trachea is midline.  IMPRESSION: Elevation of the right hemidiaphragm.  No focal airspace disease.   Original Report Authenticated By: Richarda Overlie, M.D.     Impression/Plan: 61yo with Upper GI bleed with coffee grounds emesis and melena.  Suspect peptic ulcer. Continue Protonix drip. EGD today. Continue NG suction.    LOS: 1 day   Meisha Salone C.  09/24/2012, 8:29 AM

## 2012-09-24 NOTE — Progress Notes (Signed)
[facility] PROGRESS NOTE  Dreyton J Guile MRN:[id number] DOB: 05/03/1951 DOA: 09/23/2012 PCP: TODD,JEFFREY ALLEN, MD  Assessment/Plan: Principal Problem:  *Acute GI bleeding    1. Acute GI bleed: Patient presented with hematemesis/melena, a few hours after feeling epigastric discomfort. Clinically, this is consistent wiith upper GI bleed, with peptic ulcer disease being high on the list of etiologies, although other lesions are possible. Patient is NPO, following serial CBCs, on iv Protonix. Dr Vincent Schooler provided GI consultation, and EGD is planned this AM.  2. Anemia: This is due to acute blood loss, although it may be partly dilutional, due to iv fluids. Patient's hemoglobin was 12.5 at presentation, and is 11.2 this AM. Following CBCs as described above, and will transfuse PRN.  3. Leukocytosis: Likely reactive. Patient is afebrile and has no foci of infection.  4. Hyperglycemia: random glucose was 269 at presentation, but has already normalized at 116 this AM. . Patient has no known history of DM Suspect stress reaction. Will follow CBGs. Hemoglobin A1C is pending.  5. Tobacco abuse: Counseled.    Code Status: Full Code.  Family Communication:  Disposition Plan: To be determined.    Brief narrative: 61 year-old male with history of melanoma and tobacco abuse presenting with hematemesis. Patient developed epigastric discomfort at around 8:00 PM on 09/13/12 at home, after which he threw up and it was coffee-grounds. He came to the ED, where NG tube suction revealed frank blood. Patient also had a melenic bowel movement in the ED. Denied abdominal pain or NSAIDs use. Denies having GI bleed previously but has had colonoscopy and EGD 3-4 years ago for a routine checkup.   Consultants:  Dr Vincent Schooler, GI.   Procedures:  EGD.   Antibiotics:  N/A.   HPI/Subjective: Asymptomatic.   Objective: Vital signs in last 24 hours: Temp:  [98.2 F (36.8 C)-98.8  F (37.1 C)] 98.5 F (36.9 C) (10/24 0736) Pulse Rate:  [89-102] 94  (10/24 0800) Resp:  [17-28] 17  (10/24 0800) BP: (83-133)/(41-68) 127/61 mmHg (10/24 0800) SpO2:  [94 %-99 %] 95 % (10/24 0800) Weight:  [95.3 kg (210 lb 1.6 oz)] 95.3 kg (210 lb 1.6 oz) (10/24 0300) Weight change:     Intake/Output from previous day: 10/23 0701 - 10/24 0700 In: 711.3 [I.V.:711.3] Out: 251 [Urine:1; Emesis/NG output:250] Total I/O In: 150 [I.V.:150] Out: -    Physical Exam: General: Comfortable, alert, communicative, fully oriented, not short of breath at rest.  HEENT:  Mild clinical pallor, no jaundice, no conjunctival injection or discharge. Hydration is fair.  NECK:  Supple, JVP not seen, no carotid bruits, no palpable lymphadenopathy, no palpable goiter. CHEST:  Clinically clear to auscultation, no wheezes, no crackles. HEART:  Sounds 1 and 2 heard, normal, regular, no murmurs. ABDOMEN:  Full, soft, non-tender, no palpable organomegaly, no palpable masses, normal bowel sounds. GENITALIA:  Not examined. LOWER EXTREMITIES:  No pitting edema, palpable peripheral pulses. MUSCULOSKELETAL SYSTEM:  Unremarkable.  CENTRAL NERVOUS SYSTEM:  No focal neurologic deficit on gross examination.  Lab Results:  Basename 09/24/12 0600 09/24/12 0108  WBC 15.2* 17.7*  HGB 11.2* 11.4*  HCT 33.4* 34.0*  PLT 206 201    Basename 09/24/12 0600 09/23/12 2324  NA 142 141  K 4.2 4.1  CL 109 101  CO2 24 25  GLUCOSE 116* 269*  BUN 38* 32*  CREATININE 0.93 1.09  CALCIUM 7.9* 8.5   Recent Results (from the past 240 hour(s))  MRSA PCR SCREENING       Status: Normal   Collection Time   09/24/12  2:08 AM      Component Value Range Status Comment   MRSA by PCR NEGATIVE  NEGATIVE Final      Studies/Results: Dg Chest Port 1 View  09/24/2012  *RADIOLOGY REPORT*  Clinical Data: Hematemesis.  PORTABLE CHEST - 1 VIEW  Comparison: None.  Findings: Single view of the chest demonstrates elevation of the right  hemidiaphragm.  There may be right basilar atelectasis. Upper lungs are clear.  Heart and mediastinum are within normal limits.  Trachea is midline.  IMPRESSION: Elevation of the right hemidiaphragm.  No focal airspace disease.   Original Report Authenticated By: ADAM HENN, M.D.     Medications: Scheduled Meds:   . pantoprazole (PROTONIX) IV  80 mg Intravenous Once  . sodium chloride  1,000 mL Intravenous Once  . sodium chloride  3 mL Intravenous Q12H  . DISCONTD: sodium chloride   Intravenous STAT   Continuous Infusions:   . sodium chloride 125 mL/hr at 09/24/12 0700  . pantoprozole (PROTONIX) infusion 8 mg/hr (09/24/12 0800)   PRN Meds:.acetaminophen, acetaminophen, ondansetron (ZOFRAN) IV, ondansetron, DISCONTD: ondansetron (ZOFRAN) IV    LOS: 1 day   Sarye Kath,CHRISTOPHER  [facility] Pager [phone]. If 8PM-8AM, please contact night-coverage at [email or web address], password [email or web address] 09/24/2012, 9:07 AM  LOS: 1 day     

## 2012-09-24 NOTE — ED Notes (Signed)
EDP notified of gastric contents from NG tube (coffee ground emesis).

## 2012-09-24 NOTE — ED Notes (Signed)
Called pharmacy about delay in Protonix; pharmacy states that they are sending it down now.

## 2012-09-24 NOTE — ED Notes (Signed)
Patient currently resting quietly in bed; no respiratory or acute distress noted.  Patient updated on plan of care; informed patient that EDP has made consult to hospitalist.  Patient has no other questions or concerns at this time; will continue to monitor. 

## 2012-09-24 NOTE — Op Note (Addendum)
[facility] [address] [place], [address]   ENDOSCOPY PROCEDURE REPORT  PATIENT: John, Mccarthy.  MR#: [id number] BIRTHDATE: 11/20/1951 , 61  yrs. old GENDER: Male  ENDOSCOPIST: Charlott Rakes, MD REFERRED BY:  PROCEDURE DATE:  09/24/2012 PROCEDURE:   EGD w/ control of bleeding ASA CLASS:   Class II INDICATIONS:hematemesis.   melena. MEDICATIONS: Fentanyl 75 mcg IV, Versed 5 mg IV, and Diphenhydramine (Benadryl) 37.5 mg IV  TOPICAL ANESTHETIC: Cetacaine spray X 2  DESCRIPTION OF PROCEDURE:   After the risks benefits and alternatives of the procedure were thoroughly explained, informed consent was obtained.  The Pentax Gastroscope [id number]  endoscope was introduced through the mouth and advanced to the second portion of the duodenum , limited by Without limitations.   The instrument was slowly withdrawn as the mucosa was fully examined.     FINDINGS: The endoscope was inserted into the oropharynx and esophagus was intubated.  The gastroesophageal junction was noted to be 40 cm from the incisors. The esophagus was normal in its entirety. Endoscope was advanced into the stomach, which revealed a large antral ulcer (approximately 1.5cm in diameter) with a crater appearance with surrounding erythema and edema. There is a flat red spot noted in the ulcer without any active bleeding. The nasogastric tube was removed on insertion of the endoscope into the stomach.  The endoscope was advanced to the duodenal bulb and second portion of duodenum which were unremarkable.  The endoscope was withdrawn back into the stomach and 6 cc of epinephrine:saline 1:10,000U mixture was injected around the ulcer with good blanching noted. In the greater curvature of the stomach there were 2 small ulcerations and erythema likely due to nasogastric trauma. Retroflexion revealed a small hiatal hernia.  COMPLICATIONS: None  ENDOSCOPIC IMPRESSION:     Large  Antral ulcer with flat red spot - status post epinephrine as stated above Small hiatal hernia Gastric erosions likely due to NG trauma  RECOMMENDATIONS: Protonix infusion; Do not reinsert NG; Clear liquid diet; Check H. pylori serology and treat if positive; Avoid NSAIDs; Repeat EGD in 8-12 weeks to assess healing of ulcer and if not healed will do biopsies at that time   REPEAT EXAM: N/A  _______________________________ Charlott Rakes, MD eSigned:  Charlott Rakes, MD 09/24/2012 1:30 PM    CC:  PATIENT NAME:  John Mccarthy. MR#: [id number]

## 2012-09-24 NOTE — ED Notes (Signed)
Patient being transported upstairs on portable cardiac monitor with RN. 

## 2012-09-25 ENCOUNTER — Encounter (HOSPITAL_COMMUNITY): Payer: Self-pay | Admitting: *Deleted

## 2012-09-25 ENCOUNTER — Encounter (HOSPITAL_COMMUNITY): Payer: Self-pay

## 2012-09-25 LAB — CBC
HCT: 27.2 % — ABNORMAL LOW (ref 39.0–52.0)
Hemoglobin: 9.2 g/dL — ABNORMAL LOW (ref 13.0–17.0)
WBC: 8.3 10*3/uL (ref 4.0–10.5)

## 2012-09-25 LAB — GLUCOSE, CAPILLARY

## 2012-09-25 MED ORDER — SODIUM CHLORIDE 0.9 % IV SOLN
INTRAVENOUS | Status: DC
  Administered 2012-09-25 – 2012-09-26 (×2): via INTRAVENOUS

## 2012-09-25 NOTE — Progress Notes (Addendum)
[facility] PROGRESS NOTE  John Mccarthy ZOX:[id number] DOB: 03-Dec-1950 DOA: 09/23/2012 PCP: Evette Georges, MD  Assessment/Plan: Principal Problem:  *Acute GI bleeding Active Problems:  Anemia  Tobacco abuse    1. Acute GI bleed: Patient presented with hematemesis/melena, a few hours after feeling epigastric discomfort. Clinically, this is consistent wiith upper GI bleed, with peptic ulcer disease being high on the list of etiologies, although other lesions are possible. Managed with bowel rest, iv fluids, ivi PPI . Dr Charlott Rakes provided GI consultation, and EGD on 09/24/12, demonstrated a large antral ulcer (approximately 1.5cm in diameter) with a crater appearance with surrounding erythema, edema and a flat red spot noted in the ulcer without any active bleeding. The lesion was injected, and biopsied. On detailed questioning, it appears that patient does indeed take 2 regular aspirins "about twice a week" before he plays tennis., so this may be the culprit. Today, patient has remained asymptomatic, HB is stable, and diet is being advanced per GI. Will aim discharge on 09/25/12, if diet tolerated, and Dr Bosie Clos has recommended continue Protonix drip until on solid food and then PPI PO BID for 12 weeks. Will need repeat EGD to check for healing in 8 weeks. 2. Anemia: This is due to acute blood loss, although it may be partly dilutional, due to iv fluids. Patient's hemoglobin was 12.5 at presentation, and is 11.2 on 09/24/12. Thereafter, patient has had no further clinical evidence of continuing bleed. He is hemodynamically stable. HB is reasonable at 9.2 today. Following CBS. IV fluids have been discontinued today.  3. Leukocytosis: Likely reactive. Patient is afebrile and has no foci of infection. Resolved. wcc is 8.3 today.  4. Hyperglycemia: random glucose was 269 at presentation, but has already normalized at 116 as of 09/24/12.  Patient has no known history of DM  Suspect stress reaction. Will follow CBGs. HBA1C is 5.3. 5. Tobacco abuse: Counseled.    Code Status: Full Code.  Family Communication: Fully discussed with patient and family at bedside.  Disposition Plan: Stable for transfer to telemetry floor today. Aiming discharge on 09/26/12.    Brief narrative: 61 year-old male with history of melanoma and tobacco abuse presenting with hematemesis. Patient developed epigastric discomfort at around 8:00 PM on 09/13/12 at home, after which he threw up and it was coffee-grounds. He came to the ED, where NG tube suction revealed frank blood. Patient also had a melenic bowel movement in the ED. Denied abdominal pain or NSAIDs use. Denies having GI bleed previously but has had colonoscopy and EGD 3-4 years ago for a routine checkup.   Consultants:  Dr Charlott Rakes, GI.   Procedures:  EGD.   Antibiotics:  N/A.   HPI/Subjective: No new issues.   Objective: Vital signs in last 24 hours: Temp:  [98.1 F (36.7 C)-98.9 F (37.2 C)] 98.2 F (36.8 C) (10/25 0951) Pulse Rate:  [76-93] 80  (10/25 0800) Resp:  [7-85] 16  (10/25 0800) BP: (82-156)/(46-93) 113/65 mmHg (10/25 0900) SpO2:  [91 %-100 %] 95 % (10/25 0800) Weight:  [95.1 kg (209 lb 10.5 oz)] 95.1 kg (209 lb 10.5 oz) (10/25 0500) Weight change: -0.2 kg (-7.1 oz) Last BM Date: 09/23/12  Intake/Output from previous day: 10/24 0701 - 10/25 0700 In: 3112.5 [P.O.:480; I.V.:2632.5] Out: 2600 [Urine:2600] Total I/O In: 453 [I.V.:453] Out: -    Physical Exam: General: Comfortable, alert, communicative, fully oriented, not short of breath at rest.  HEENT:  Mild clinical pallor, no jaundice, no conjunctival  injection or discharge. Hydration is fair.  NECK:  Supple, JVP not seen, no carotid bruits, no palpable lymphadenopathy, no palpable goiter. CHEST:  Clinically clear to auscultation, no wheezes, no crackles. HEART:  Sounds 1 and 2 heard, normal, regular, no murmurs. ABDOMEN:   Full, soft, non-tender, no palpable organomegaly, no palpable masses, normal bowel sounds. GENITALIA:  Not examined. LOWER EXTREMITIES:  No pitting edema, palpable peripheral pulses. MUSCULOSKELETAL SYSTEM:  Unremarkable.  CENTRAL NERVOUS SYSTEM:  No focal neurologic deficit on gross examination.  Lab Results:  Basename 09/25/12 0901 09/24/12 1713  WBC 8.3 18.0*  HGB 9.2* 11.0*  HCT 27.2* 32.5*  PLT 161 201    Basename 09/24/12 0600 09/23/12 2324  NA 142 141  K 4.2 4.1  CL 109 101  CO2 24 25  GLUCOSE 116* 269*  BUN 38* 32*  CREATININE 0.93 1.09  CALCIUM 7.9* 8.5   Recent Results (from the past 240 hour(s))  MRSA PCR SCREENING     Status: Normal   Collection Time   09/24/12  2:08 AM      Component Value Range Status Comment   MRSA by PCR NEGATIVE  NEGATIVE Final      Studies/Results: Dg Chest Port 1 View  09/24/2012  *RADIOLOGY REPORT*  Clinical Data: Hematemesis.  PORTABLE CHEST - 1 VIEW  Comparison: None.  Findings: Single view of the chest demonstrates elevation of the right hemidiaphragm.  There may be right basilar atelectasis. Upper lungs are clear.  Heart and mediastinum are within normal limits.  Trachea is midline.  IMPRESSION: Elevation of the right hemidiaphragm.  No focal airspace disease.   Original Report Authenticated By: Richarda Overlie, M.D.     Medications: Scheduled Meds:    . influenza  inactive virus vaccine  0.5 mL Intramuscular Tomorrow-1000  . sodium chloride  3 mL Intravenous Q12H   Continuous Infusions:    . sodium chloride 125 mL/hr at 09/25/12 1113  . pantoprozole (PROTONIX) infusion 8 mg/hr (09/24/12 1527)   PRN Meds:.acetaminophen, acetaminophen, ondansetron (ZOFRAN) IV, ondansetron, DISCONTD: butamben-tetracaine-benzocaine, DISCONTD: diphenhydrAMINE, DISCONTD: EPINEPHrine 1:10,000, 10 mL syringe/NS, 10 mL vial for sclerotherapy inj mixture, DISCONTD: fentaNYL, DISCONTD: midazolam    LOS: 2 days   Elton Heid,CHRISTOPHER  [facility] Pager [phone]. If 8PM-8AM, please contact night-coverage at [email or web address], password [email or web address] 09/25/2012, 12:13 PM  LOS: 2 days

## 2012-09-25 NOTE — Discharge Summary (Signed)
Physician Discharge Summary  John Mccarthy JXB:[id number] DOB: 06-Aug-1951 DOA: 09/23/2012  PCP: Evette Georges, MD  Admit date: 09/23/2012 Discharge date: 09/26/2012  Time spent: 40 minutes  Recommendations for Outpatient Follow-up:  1. Follow up with Dr Charlott Rakes, gastroenterologist.   Discharge Diagnoses:  Principal Problem:  *Acute GI bleeding Active Problems:  Anemia  Tobacco abuse   Discharge Condition: Satisfactory.   Diet recommendation: Regular.   Filed Weights   09/24/12 0300 09/25/12 0000 09/25/12 0500  Weight: 95.3 kg (210 lb 1.6 oz) 95.1 kg (209 lb 10.5 oz) 95.1 kg (209 lb 10.5 oz)    History of present illness:  61 year-old male with history of melanoma and tobacco abuse presenting with hematemesis. Patient developed epigastric discomfort at around 8:00 PM on 09/13/12 at home, after which he threw up and it was coffee-grounds. He came to the ED, where NG tube suction revealed frank blood. Patient also had a melenic bowel movement in the ED. Denied abdominal pain or NSAIDs use. Denies having GI bleed previously but has had colonoscopy and EGD 3-4 years ago for a routine checkup.   Hospital Course:  1. Acute GI bleed: Patient presented with hematemesis/melena, a few hours after feeling epigastric discomfort. Clinically, this was consistent wiith upper GI bleed, with peptic ulcer disease being high on the list of etiologies, although other lesions were possible. He was managed with bowel rest, iv fluids, ivi PPI. Dr Charlott Rakes provided GI consultation and EGD on 09/24/12, demonstrated a large antral ulcer (approximately 1.5cm in diameter) with a crater appearance with surrounding erythema, edema and a flat red spot noted in the ulcer without any active bleeding. The lesion was injected, and biopsied. On detailed questioning, it appears that patient does indeed take 2 regular aspirins "about twice a week" before he plays tennis, so this may be the  culprit. As of 09/24/12, patient remained asymptomatic, NG-tube was discontinued on that date. HB remained stable, and clears were started. Diet was advanced from 09/25/12, without deleterious effect. H. Pylori IgG was negative. Dr Bosie Clos has recommended continue Protonix PO BID for 12 weeks. He will need repeat EGD to check for healing, in 8 weeks. Patient will follow up with Dr Bosie Clos, on discharge. He has been instructed to avoid all NSAIDS. 2. Anemia: This was due to acute blood loss, although it may have been partly dilutional, due to iv fluids. Patient's hemoglobin was 12.5 at presentation, and 11.2 on 09/24/12. Thereafter, patient had no further clinical evidence of continuing bleed and remained hemodynamically stable. HB was reasonable at 9.9 on 09/26/12. 3. Leukocytosis: Wcc was 17.8 at presentation. Likely reactive. Patient remained afebrile and had no discernible foci of infection. Now resolved. Wcc was 6.9 on 09/25/12.  4. Hyperglycemia: Random glucose was 269 at presentation, but had already normalized at 116 as of 09/24/12. Patient has no known history of DM. Suspect stress reaction. HBA1C is 5.3.  5. Tobacco abuse: Counseled.    Procedures:  See below.   Consultations:  Dr Charlott Rakes, gastroenterologist.   Discharge Exam: Filed Vitals:   09/25/12 1239 09/25/12 1535 09/25/12 2133 09/26/12 0517  BP:  101/84 114/57 100/66  Pulse:  85 87 80  Temp: 98.6 F (37 C) 98.7 F (37.1 C) 97.9 F (36.6 C) 98.2 F (36.8 C)  TempSrc: Oral Oral Oral Oral  Resp:  18 20 20   Height:      Weight:      SpO2:  98% 100% 97%   General: Comfortable, alert,  communicative, fully oriented, not short of breath at rest.  HEENT: Mild clinical pallor, no jaundice, no conjunctival injection or discharge. Hydration is fair.  NECK: Supple, JVP not seen, no carotid bruits, no palpable lymphadenopathy, no palpable goiter.  CHEST: Clinically clear to auscultation, no wheezes, no crackles.    HEART: Sounds 1 and 2 heard, normal, regular, no murmurs.  ABDOMEN: Full, soft, non-tender, no palpable organomegaly, no palpable masses, normal bowel sounds.  GENITALIA: Not examined.  LOWER EXTREMITIES: No pitting edema, palpable peripheral pulses.  MUSCULOSKELETAL SYSTEM: Unremarkable.  CENTRAL NERVOUS SYSTEM: No focal neurologic deficit on gross examination.  Discharge Instructions      Discharge Orders    Future Orders Please Complete By Expires   Diet general      Increase activity slowly          Medication List     As of 09/26/2012 10:41 AM    STOP taking these medications         aspirin 325 MG tablet      TAKE these medications         pantoprazole 40 MG tablet   Commonly known as: PROTONIX   Take 1 tablet (40 mg total) by mouth 2 (two) times daily.         Follow-up Information    Follow up with TODD,JEFFREY Freida Busman, MD. (As needed)    Contact information:   [address] [place]ena Flake [facility] [place] [address] [phone]       Schedule an appointment as soon as possible for a visit with Shirley Friar., MD.   Contact information:   [address] AND Jaynie Crumble [place] [address] [phone]           The results of significant diagnostics from this hospitalization (including imaging, microbiology, ancillary and laboratory) are listed below for reference.    Significant Diagnostic Studies: Dg Chest Port 1 View  09/24/2012  *RADIOLOGY REPORT*  Clinical Data: Hematemesis.  PORTABLE CHEST - 1 VIEW  Comparison: None.  Findings: Single view of the chest demonstrates elevation of the right hemidiaphragm.  There may be right basilar atelectasis. Upper lungs are clear.  Heart and mediastinum are within normal limits.  Trachea is midline.  IMPRESSION: Elevation of the right hemidiaphragm.  No focal airspace disease.   Original Report Authenticated By: Richarda Overlie, M.D.     Microbiology: Recent Results (from the  past 240 hour(s))  MRSA PCR SCREENING     Status: Normal   Collection Time   09/24/12  2:08 AM      Component Value Range Status Comment   MRSA by PCR NEGATIVE  NEGATIVE Final      Labs: Basic Metabolic Panel:  Lab 09/26/12 4401 09/24/12 0600 09/23/12 2324  NA 140 142 141  K 3.5 4.2 4.1  CL 108 109 101  CO2 27 24 25   GLUCOSE 102* 116* 269*  BUN 9 38* 32*  CREATININE 0.92 0.93 1.09  CALCIUM 8.3* 7.9* 8.5  MG -- -- --  PHOS -- -- --   Liver Function Tests:  Lab 09/24/12 0600 09/23/12 2324  AST 15 19  ALT 17 19  ALKPHOS 68 76  BILITOT 0.4 0.3  PROT 5.5* 5.9*  ALBUMIN 2.9* 3.1*   No results found for this basename: LIPASE:5,AMYLASE:5 in the last 168 hours No results found for this basename: AMMONIA:5 in the last 168 hours CBC:  Lab 09/26/12 0630 09/25/12 0901 09/24/12 1713 09/24/12 0930 09/24/12 0600 09/23/12  2324  WBC 6.9 8.3 18.0* 12.8* 15.2* --  NEUTROABS -- -- -- -- -- 14.2*  HGB 9.9* 9.2* 11.0* 10.9* 11.2* --  HCT 28.6* 27.2* 32.5* 32.1* 33.4* --  MCV 89.4 90.7 89.8 90.4 89.3 --  PLT 172 161 201 208 206 --   Cardiac Enzymes:  Lab 09/23/12 2325  CKTOTAL --  CKMB --  CKMBINDEX --  TROPONINI <0.30   BNP: BNP (last 3 results) No results found for this basename: PROBNP:3 in the last 8760 hours CBG:  Lab 09/25/12 0737  GLUCAP 100*       Signed:  Cutler Sunday,CHRISTOPHER  [facility] 09/26/2012, 10:41 AM

## 2012-09-25 NOTE — Progress Notes (Addendum)
Patient ID: John Mccarthy, male   DOB: 1951-04-21, 61 y.o.   MRN: [id number] [facility] Gastroenterology Progress Note  John Mccarthy 61 y.o. 1951/08/20   Subjective: Feels better. Son at bedside. Denies abdominal pain. Gastric ulcer noted on EGD. He now admits to taking 2 regular aspirins "about twice a week" before he plays tennis, which his wife told me yesterday in endoscopy. No further bleeding. Tolerating clears.  Objective: Vital signs in last 24 hours: Filed Vitals:   09/25/12 0700  BP: 95/55  Pulse: 78  Temp: 98.5  Resp: 16    Physical Exam: Gen: alert, no acute distress Abd: soft, nontender, nondistended, +BS  Lab Results:  Basename 09/24/12 0600 09/23/12 2324  NA 142 141  K 4.2 4.1  CL 109 101  CO2 24 25  GLUCOSE 116* 269*  BUN 38* 32*  CREATININE 0.93 1.09  CALCIUM 7.9* 8.5  MG -- --  PHOS -- --    Basename 09/24/12 0600 09/23/12 2324  AST 15 19  ALT 17 19  ALKPHOS 68 76  BILITOT 0.4 0.3  PROT 5.5* 5.9*  ALBUMIN 2.9* 3.1*    Basename 09/24/12 1713 09/24/12 0930 09/23/12 2324  WBC 18.0* 12.8* --  NEUTROABS -- -- 14.2*  HGB 11.0* 10.9* --  HCT 32.5* 32.1* --  MCV 89.8 90.4 --  PLT 201 208 --    Basename 09/24/12 0600 09/23/12 2325  LABPROT 14.6 14.5  INR 1.16 1.15      Assessment/Plan: 61yo s/p upper GI bleed due to a gastric ulcer probably from NSAIDs. H. Pylori serology pending. Will check CBC. Advance diet. BPs low but no evidence of rebleeding. He reports that his baseline is on the low side. Ok to go to telemetry if medicine agrees. Continue protonix drip until on solid food and then PPI PO BID for 12 weeks. Will need repeat EGD to check for healing in 8 weeks.   Londyn Wotton C. 09/25/2012, 8:38 AM

## 2012-09-26 LAB — CBC
Hemoglobin: 9.9 g/dL — ABNORMAL LOW (ref 13.0–17.0)
MCH: 30.9 pg (ref 26.0–34.0)
MCV: 89.4 fL (ref 78.0–100.0)
RBC: 3.2 MIL/uL — ABNORMAL LOW (ref 4.22–5.81)

## 2012-09-26 LAB — BASIC METABOLIC PANEL
CO2: 27 mEq/L (ref 19–32)
Calcium: 8.3 mg/dL — ABNORMAL LOW (ref 8.4–10.5)
Creatinine, Ser: 0.92 mg/dL (ref 0.50–1.35)
Glucose, Bld: 102 mg/dL — ABNORMAL HIGH (ref 70–99)
Sodium: 140 mEq/L (ref 135–145)

## 2012-09-26 MED ORDER — PANTOPRAZOLE SODIUM 40 MG PO TBEC: 40.0000 mg | DELAYED_RELEASE_TABLET | Freq: Two times a day (BID) | ORAL | Status: DC

## 2012-09-26 NOTE — Progress Notes (Signed)
Patient ID: John Mccarthy, male   DOB: 09/04/1951, 61 y.o.   MRN: [id number]  Doing well. No further bleeding. Hgb 9.9. H. Pylori serology negative. Ulcer likely NSAID-induced. Avoid NSAIDs. Needs to stay on PPI BID until further notice. I will have him f/u with me in November. Will then arrange EGD for late December or early January. Agree with discharge home.

## 2012-09-26 NOTE — Progress Notes (Signed)
09/26/12 Patient to be discharged today home with wife. Discharge instructions reviewed with patient and wife. IV sites removed with problem.

## 2012-09-28 ENCOUNTER — Encounter (HOSPITAL_COMMUNITY): Payer: Self-pay | Admitting: Gastroenterology

## 2013-05-18 HISTORY — PX: ACHILLES TENDON SURGERY: SHX542

## 2013-06-15 ENCOUNTER — Emergency Department (HOSPITAL_COMMUNITY): Payer: BC Managed Care – PPO

## 2013-06-15 ENCOUNTER — Observation Stay (HOSPITAL_COMMUNITY)
Admission: EM | Admit: 2013-06-15 | Discharge: 2013-06-16 | Disposition: A | Discharge status: Home / Self Care | Payer: BC Managed Care – PPO | Attending: Internal Medicine | Admitting: Internal Medicine

## 2013-06-15 ENCOUNTER — Encounter (HOSPITAL_COMMUNITY): Payer: Self-pay | Admitting: Family Medicine

## 2013-06-15 DIAGNOSIS — Z9889 Other specified postprocedural states: Secondary | ICD-10-CM

## 2013-06-15 DIAGNOSIS — K279 Peptic ulcer, site unspecified, unspecified as acute or chronic, without hemorrhage or perforation: Secondary | ICD-10-CM

## 2013-06-15 DIAGNOSIS — Z85828 Personal history of other malignant neoplasm of skin: Secondary | ICD-10-CM

## 2013-06-15 DIAGNOSIS — Z8711 Personal history of peptic ulcer disease: Secondary | ICD-10-CM | POA: Insufficient documentation

## 2013-06-15 DIAGNOSIS — I2699 Other pulmonary embolism without acute cor pulmonale: Principal | ICD-10-CM | POA: Insufficient documentation

## 2013-06-15 DIAGNOSIS — Z8719 Personal history of other diseases of the digestive system: Secondary | ICD-10-CM

## 2013-06-15 DIAGNOSIS — R0602 Shortness of breath: Secondary | ICD-10-CM | POA: Insufficient documentation

## 2013-06-15 DIAGNOSIS — D649 Anemia, unspecified: Secondary | ICD-10-CM

## 2013-06-15 DIAGNOSIS — K922 Gastrointestinal hemorrhage, unspecified: Secondary | ICD-10-CM

## 2013-06-15 DIAGNOSIS — Z720 Tobacco use: Secondary | ICD-10-CM

## 2013-06-15 HISTORY — DX: Malignant melanoma of skin, unspecified: C43.9

## 2013-06-15 HISTORY — DX: Other pulmonary embolism without acute cor pulmonale: I26.99

## 2013-06-15 LAB — PRO B NATRIURETIC PEPTIDE: Pro B Natriuretic peptide (BNP): 58.2 pg/mL (ref 0–125)

## 2013-06-15 LAB — BASIC METABOLIC PANEL WITH GFR
BUN: 15 mg/dL (ref 6–23)
CO2: 24 meq/L (ref 19–32)
Calcium: 9.1 mg/dL (ref 8.4–10.5)
Chloride: 102 meq/L (ref 96–112)
Creatinine, Ser: 1.02 mg/dL (ref 0.50–1.35)
GFR calc Af Amer: 88 mL/min — ABNORMAL LOW
GFR calc non Af Amer: 76 mL/min — ABNORMAL LOW
Glucose, Bld: 95 mg/dL (ref 70–99)
Potassium: 4 meq/L (ref 3.5–5.1)
Sodium: 138 meq/L (ref 135–145)

## 2013-06-15 LAB — CBC
HCT: 45.3 % (ref 39.0–52.0)
Hemoglobin: 15.3 g/dL (ref 13.0–17.0)
MCH: 28.8 pg (ref 26.0–34.0)
MCHC: 33.8 g/dL (ref 30.0–36.0)
MCV: 85.3 fL (ref 78.0–100.0)
Platelets: 212 10*3/uL (ref 150–400)
RBC: 5.31 MIL/uL (ref 4.22–5.81)
RDW: 13.6 % (ref 11.5–15.5)
WBC: 8.3 10*3/uL (ref 4.0–10.5)

## 2013-06-15 LAB — POCT I-STAT TROPONIN I: Troponin i, poc: 0 ng/mL (ref 0.00–0.08)

## 2013-06-15 LAB — TROPONIN I: Troponin I: <0.3 ng/mL (ref <0.30)

## 2013-06-15 MED ORDER — ONDANSETRON HCL 4 MG/2ML IJ SOLN: 4.0000 mg | Freq: Four times a day (QID) | INTRAMUSCULAR | Status: DC | PRN

## 2013-06-15 MED ORDER — IOHEXOL 350 MG/ML SOLN
100.0000 mL | Freq: Once | INTRAVENOUS | Status: AC | PRN
  Administered 2013-06-15: 100 mL via INTRAVENOUS

## 2013-06-15 MED ORDER — ALUM & MAG HYDROXIDE-SIMETH 200-200-20 MG/5ML PO SUSP: 30.0000 mL | Freq: Four times a day (QID) | ORAL | Status: DC | PRN

## 2013-06-15 MED ORDER — SODIUM CHLORIDE 0.9 % IJ SOLN
3.0000 mL | Freq: Two times a day (BID) | INTRAMUSCULAR | Status: DC
  Administered 2013-06-15: 3 mL via INTRAVENOUS

## 2013-06-15 MED ORDER — RIVAROXABAN 15 MG PO TABS
15.0000 mg | ORAL_TABLET | Freq: Two times a day (BID) | ORAL | Status: DC
  Administered 2013-06-16: 15 mg via ORAL
  Filled 2013-06-15 (×3): qty 1

## 2013-06-15 MED ORDER — ENOXAPARIN SODIUM 100 MG/ML ~~LOC~~ SOLN
95.0000 mg | Freq: Once | SUBCUTANEOUS | Status: AC
  Administered 2013-06-15: 95 mg via SUBCUTANEOUS
  Filled 2013-06-15: qty 1

## 2013-06-15 MED ORDER — ONDANSETRON HCL 4 MG PO TABS: 4.0000 mg | ORAL_TABLET | Freq: Four times a day (QID) | ORAL | Status: DC | PRN

## 2013-06-15 MED ORDER — OXYCODONE HCL 5 MG PO TABS: 5.0000 mg | ORAL_TABLET | ORAL | Status: DC | PRN

## 2013-06-15 MED ORDER — PANTOPRAZOLE SODIUM 40 MG PO TBEC
40.0000 mg | DELAYED_RELEASE_TABLET | Freq: Two times a day (BID) | ORAL | Status: DC
  Administered 2013-06-15 – 2013-06-16 (×2): 40 mg via ORAL
  Filled 2013-06-15 (×2): qty 1

## 2013-06-15 MED ORDER — ACETAMINOPHEN 325 MG PO TABS: 650.0000 mg | ORAL_TABLET | Freq: Four times a day (QID) | ORAL | Status: DC | PRN

## 2013-06-15 MED ORDER — ACETAMINOPHEN 650 MG RE SUPP: 650.0000 mg | Freq: Four times a day (QID) | RECTAL | Status: DC | PRN

## 2013-06-15 MED ORDER — TRAZODONE 25 MG HALF TABLET
25.0000 mg | ORAL_TABLET | Freq: Every evening | ORAL | Status: DC | PRN
  Administered 2013-06-15: 25 mg via ORAL
  Filled 2013-06-15: qty 1

## 2013-06-15 NOTE — ED Notes (Signed)
Pt c/o SOB, lightheadedness, nausea and right sided CP since Saturday night. sts it has all gotten worse today. Pt had recent sx on right leg. Pt sts he just feels like he can't catch his breath. Denies swelling/new pain in right leg. Pt wearing boot on right leg and uses crutches. Pt in nad, skin warm and dry, resp e/u.

## 2013-06-15 NOTE — H&P (Signed)
[facility] History and Physical  TALBERT TREMBATH ZOX:[id number] DOB: January 10, 1951 DOA: 06/15/2013  PCP: Evette Georges, MD  Specialists:Dr. Victorino Dike, Orthopedic Surgeon  Dr. Charlott Rakes, Gastroenterology   Chief Complaint: SOB on exertion and right sided chest pain  HPI: John Mccarthy is a 62 y.o. male with a history of recent right achilles tendon repair on 6/17 by Dr. Victorino Dike, who presented to the ED complaining of SOB, nausea x 3 days and right sided chest pain that began yesterday and a near syncopal episode today. He complains of exertional dyspnea if he walks across the parking lot on his crutches.  He denies any pain or swelling in his lower extremities.  He has been less active than usual due to surgery and states his only recent travel was a 3 hour car trip to [place], [place].  His other medical history includes a bleeding gastric ulcer (09/2012) in which resolution was confirmed by endoscopy and he denies any recent hematochezia or melena.  He also has a history of melanoma.    CTA in ED reveals multiple PEs.  He denies hemoptysis.  His labs and vital signs are stable.  Of note, his daughter-in-law is a Teacher, adult education. The patient turns to her for medical guidance.   Review of Systems: The patient denies anorexia,syncope, hemoptysis, abdominal pain, melena, hematochezia, severe indigestion/heartburn,  Incontinence, muscle weakness  Past Medical History  Diagnosis Date  . Melanoma   . Skin cancer (melanoma)    Past Surgical History  Procedure Laterality Date  . Hand surgery    . Esophagogastroduodenoscopy  09/24/2012    Procedure: ESOPHAGOGASTRODUODENOSCOPY (EGD);  Surgeon: Shirley Friar, MD;  Location: [facility] ENDOSCOPY;  Service: Endoscopy;  Laterality: N/A;   Social History: He reports occasionally smoking cigars as well as occasional alcohol use. Lives at home with his wife and is independent with his ADLs.   Allergies  Allergen Reactions  .  Lactose Intolerance (Gi) Diarrhea    Family History  Problem Relation Age of Onset  . Heart failure Father   Mother died at 36 of dementia.  No history of blood clots in the family.  Prior to Admission medications   Medication Sig Start Date End Date Taking? Authorizing Provider  aspirin 81 MG tablet Take 81 mg by mouth daily.   Yes Historical Provider, MD  pantoprazole (PROTONIX) 40 MG tablet Take 1 tablet (40 mg total) by mouth 2 (two) times daily. 09/26/12  Yes Laveda Norman, MD   Physical Exam: Filed Vitals:   06/15/13 1421 06/15/13 1422 06/15/13 1430 06/15/13 1515  BP: 120/75  125/72 135/86  Pulse:  78 73 76  Temp:      Resp:  21 20 16   SpO2:  95% 93% 95%     General:  WDWN.  NAD.  Appears well.  Neck: Supple  Cardiovascular: RRR. No m/g/r.  No JVD. Chest non-tender to palpation.  Respiratory: Clear to auscultation bilaterally.  No accessory muscle movement.   Abdomen: Soft, non-tender.  No masses or organomegaly.  NBS.  Skin: No erythema on lower extremities.  Musculoskeletal: Homans negative on LLE.  Unable to test RLE.  Psychiatric: Positive affect, Cooperative  Neurologic: Alert and oriented x 3, CN 2-12 grossly in tact  Labs on Admission:  Basic Metabolic Panel:  Recent Labs Lab 06/15/13 1140  NA 138  K 4.0  CL 102  CO2 24  GLUCOSE 95  BUN 15  CREATININE 1.02  CALCIUM 9.1   CBC:  Recent Labs  Lab 06/15/13 1140  WBC 8.3  HGB 15.3  HCT 45.3  MCV 85.3  PLT 212    BNP (last 3 results)  Recent Labs  06/15/13 1140  PROBNP 58.2    Radiological Exams on Admission: Dg Chest 2 View  06/15/2013   *RADIOLOGY REPORT*  Clinical Data: Shortness of breath  CHEST - 2 VIEW  Comparison: 09/24/2012  Findings: Cardiomediastinal silhouette is stable.  Elevation of the right hemidiaphragm again noted.  No acute infiltrate or pulmonary edema.  Bony thorax is stable.  IMPRESSION: No active disease.  Elevation of the right hemidiaphragm again noted.    Original Report Authenticated By: Natasha Mead, M.D.   Ct Angio Chest Pe W/cm &/or Wo Cm  06/15/2013   *RADIOLOGY REPORT*  Clinical Data: Shortness of breath and stabbing chest pain.  CT ANGIOGRAPHY CHEST  Technique:  Multidetector CT imaging of the chest using the standard protocol during bolus administration of intravenous contrast. Multiplanar reconstructed images including MIPs were obtained and reviewed to evaluate the vascular anatomy.  Contrast: OMNIPAQUE IOHEXOL 350 MG/ML SOLN  Comparison: No priors.  Findings:  Mediastinum: There are multiple filling defects within the right upper lobe pulmonary arterial tree involving the distal aspect of the right upper lobe pulmonary artery, as well as segmental and subsegmental sized branches.  These appear to be nonocclusive at this time.  Some of these are centrally located within the vessel lumen, while others are more eccentric suggesting some degree of chronicity.  No larger central pulmonary embolus is otherwise noted. Heart size is borderline enlarged. There is no significant pericardial fluid, thickening or pericardial calcification. No pathologically enlarged mediastinal or hilar lymph nodes. Esophagus is unremarkable in appearance.  Lungs/Pleura: Elevation of the right hemidiaphragm with some associated passive atelectasis in the right base.  There is a focal area of air trapping involving the lateral segment of the right middle lobe.  No acute consolidative airspace disease.  No pleural effusions.  No definite suspicious appearing pulmonary nodules or masses are identified at this time.  Upper Abdomen: Unremarkable.  Musculoskeletal: There are no aggressive appearing lytic or blastic lesions noted in the visualized portions of the skeleton.  IMPRESSION: 1.  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  All filling defects  appear to be nonocclusive, and there is no evidence of frank pulmonary infarction at this time. 2.  Additional incidental findings, as above.  Critical Value/emergent results were called by telephone at the time of interpretation on 06/15/2013 at 02:24 p.m. to Dr. Judd Lien, who verbally acknowledged these results.   Original Report Authenticated By: Trudie Reed, M.D.    EKG: Independently reviewed. NSR.  Assessment/Plan Active Problems:   Pulmonary embolus   History of GI bleed   H/O Achilles tendon repair  Pulmonary embolus -Multiple clots seen on CTA -Patient hemodynamically stable -Given lovenox in the ED, plan to start xarelto tomorrow -Cycle troponins -Admit for observation  History of GI bleed -On protonix -No recent bleeding  Recent achilles tendon repair (6/17 by Dr. Victorino Dike) -Follow up outpatient   Code Status: Full Family Communication: Wife at bedside Disposition Plan: Observation Time spent: 60 minutes  Rema Fendt, PA-S Algis Downs, [place] [facility] Pager [phone]  If 7PM-7AM, please contact night-coverage [email or web address] Password [email or web address] 06/15/2013, 4:21 PM

## 2013-06-15 NOTE — ED Notes (Signed)
Per pt sts since Saturday he has been having SOB, nausea, and feels lke he is going to pass out. sts worse this am. Pt recent surgery on June 17th.

## 2013-06-15 NOTE — ED Provider Notes (Signed)
History    CSN: [id number] Arrival date & time 06/15/13  1026  First MD Initiated Contact with Patient 06/15/13 1108     Chief Complaint  Patient presents with  . Shortness of Breath  . Nausea   (Consider location/radiation/quality/duration/timing/severity/associated sxs/prior Treatment) HPI Comments: Pt w/ PMHx of achilles tendon repair 1 month ago now w/ dyspnea x4 days, worse this am, pre syncope, nausea, transient right sided intermittent chest pain - pleuritic. No hx of dvt/pe, no hx of CAD. Hx of gastric ulcer. No hx of melena, diarrhea or vomiting. No unilateral leg swelling. Has been immobilized in cast x 4 wks. No fever or cough. Chest pain is not exertional.  Patient is a 62 y.o. male presenting with general illness. The history is provided by the patient. No language interpreter was used.  Illness Location:  Cardio/pulm Quality:  Dyspnea Severity:  Moderate Timing:  Constant Progression:  Worsening Chronicity:  New Associated symptoms: chest pain, nausea and shortness of breath   Associated symptoms: no abdominal pain, no congestion, no cough, no diarrhea, no fever, no headaches, no rash, no sore throat and no vomiting    Past Medical History  Diagnosis Date  . Melanoma    Past Surgical History  Procedure Laterality Date  . Hand surgery    . Esophagogastroduodenoscopy  09/24/2012    Procedure: ESOPHAGOGASTRODUODENOSCOPY (EGD);  Surgeon: Shirley Friar, MD;  Location: [facility] ENDOSCOPY;  Service: Endoscopy;  Laterality: N/A;   Family History  Problem Relation Age of Onset  . Heart failure Father    History  Substance Use Topics  . Smoking status: Never Smoker   . Smokeless tobacco: Never Used  . Alcohol Use: Yes    Review of Systems  Constitutional: Negative for fever and chills.  HENT: Negative for congestion and sore throat.   Respiratory: Positive for shortness of breath. Negative for cough.   Cardiovascular: Positive for chest pain. Negative for leg  swelling.  Gastrointestinal: Positive for nausea. Negative for vomiting, abdominal pain, diarrhea and constipation.  Genitourinary: Negative for dysuria and frequency.  Skin: Negative for color change and rash.  Neurological: Negative for dizziness and headaches.  Psychiatric/Behavioral: Negative for confusion and agitation.  All other systems reviewed and are negative.    Allergies  Lactose intolerance (gi)  Home Medications   Current Outpatient Rx  Name  Route  Sig  Dispense  Refill  . aspirin 81 MG tablet   Oral   Take 81 mg by mouth daily.         . pantoprazole (PROTONIX) 40 MG tablet   Oral   Take 1 tablet (40 mg total) by mouth 2 (two) times daily.   60 tablet   2    BP 148/77  Pulse 100  Temp(Src) 98.9 F (37.2 C)  Resp 18  SpO2 95% Physical Exam  Constitutional: He is oriented to person, place, and time. He appears well-developed and well-nourished. No distress.  HENT:  Head: Normocephalic and atraumatic.  Eyes: EOM are normal. Pupils are equal, round, and reactive to light.  Neck: Normal range of motion. Neck supple.  Cardiovascular: Normal rate and regular rhythm.   Pulmonary/Chest: Effort normal and breath sounds normal. No respiratory distress.  Abdominal: Soft. He exhibits no distension.  Musculoskeletal: Normal range of motion. He exhibits no edema.       Right lower leg: He exhibits no edema.       Left lower leg: He exhibits no swelling and no edema.  Legs: Neurological: He is alert and oriented to person, place, and time.  Skin: Skin is warm and dry.  Psychiatric: He has a normal mood and affect. His behavior is normal.    ED Course  Procedures (including critical care time) Labs Reviewed  CBC  BASIC METABOLIC PANEL  PRO B NATRIURETIC PEPTIDE  D-DIMER, QUANTITATIVE   Dg Chest 2 View  06/15/2013   *RADIOLOGY REPORT*  Clinical Data: Shortness of breath  CHEST - 2 VIEW  Comparison: 09/24/2012  Findings: Cardiomediastinal silhouette is  stable.  Elevation of the right hemidiaphragm again noted.  No acute infiltrate or pulmonary edema.  Bony thorax is stable.  IMPRESSION: No active disease.  Elevation of the right hemidiaphragm again noted.   Original Report Authenticated By: Natasha Mead, M.D.   Date: 06/15/2013  Rate: 97  Rhythm: normal sinus rhythm  QRS Axis: normal  Intervals: normal  ST/T Wave abnormalities: normal  Conduction Disutrbances:none  Narrative Interpretation:   Old EKG Reviewed: unchanged  Results for orders placed during the hospital encounter of 06/15/13  CBC      Result Value Range   WBC 8.3  4.0 - 10.5 K/uL   RBC 5.31  4.22 - 5.81 MIL/uL   Hemoglobin 15.3  13.0 - 17.0 g/dL   HCT 21.3  08.6 - 57.8 %   MCV 85.3  78.0 - 100.0 fL   MCH 28.8  26.0 - 34.0 pg   MCHC 33.8  30.0 - 36.0 g/dL   RDW 46.9  62.9 - 52.8 %   Platelets 212  150 - 400 K/uL  BASIC METABOLIC PANEL      Result Value Range   Sodium 138  135 - 145 mEq/L   Potassium 4.0  3.5 - 5.1 mEq/L   Chloride 102  96 - 112 mEq/L   CO2 24  19 - 32 mEq/L   Glucose, Bld 95  70 - 99 mg/dL   BUN 15  6 - 23 mg/dL   Creatinine, Ser 4.13  0.50 - 1.35 mg/dL   Calcium 9.1  8.4 - 24.4 mg/dL   GFR calc non Af Amer 76 (*) >90 mL/min   GFR calc Af Amer 88 (*) >90 mL/min  PRO B NATRIURETIC PEPTIDE      Result Value Range   Pro B Natriuretic peptide (BNP) 58.2  0 - 125 pg/mL  D-DIMER, QUANTITATIVE      Result Value Range   D-Dimer, Quant 0.82 (*) 0.00 - 0.48 ug/mL-FEU  POCT I-STAT TROPONIN I      Result Value Range   Troponin i, poc 0.00  0.00 - 0.08 ng/mL   Comment 3                CT Angio Chest PE W/Cm &/Or Wo Cm (Final result)  Result time: 06/15/13 14:27:52    Final result by Rad Results In Interface (06/15/13 14:27:52)    Narrative:   *RADIOLOGY REPORT*  Clinical Data: Shortness of breath and stabbing chest pain.  CT ANGIOGRAPHY CHEST  Technique: Multidetector CT imaging of the chest using the standard protocol during bolus  administration of intravenous contrast. Multiplanar reconstructed images including MIPs were obtained and reviewed to evaluate the vascular anatomy.  Contrast: OMNIPAQUE IOHEXOL 350 MG/ML SOLN  Comparison: No priors.  Findings:  Mediastinum: There are multiple filling defects within the right upper lobe pulmonary arterial tree involving the distal aspect of the right upper lobe pulmonary artery, as well as segmental and subsegmental sized branches. These  appear to be nonocclusive at this time. Some of these are centrally located within the vessel lumen, while others are more eccentric suggesting some degree of chronicity. No larger central pulmonary embolus is otherwise noted. Heart size is borderline enlarged. There is no significant pericardial fluid, thickening or pericardial calcification. No pathologically enlarged mediastinal or hilar lymph nodes. Esophagus is unremarkable in appearance.  Lungs/Pleura: Elevation of the right hemidiaphragm with some associated passive atelectasis in the right base. There is a focal area of air trapping involving the lateral segment of the right middle lobe. No acute consolidative airspace disease. No pleural effusions. No definite suspicious appearing pulmonary nodules or masses are identified at this time.  Upper Abdomen: Unremarkable.  Musculoskeletal: There are no aggressive appearing lytic or blastic lesions noted in the visualized portions of the skeleton.  IMPRESSION: 1. 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 All filling defects appear to be nonocclusive, and there is no evidence of frank pulmonary infarction at this time. 2. Additional incidental findings, as above.  Critical Value/emergent results were called by telephone at the time of interpretation on 06/15/2013 at 02:24 p.m. to Dr.  Judd Lien, who verbally acknowledged these results.   Original Report Authenticated By: Trudie Reed, M.D.             DG Chest 2 View (Final result)  Result time: 06/15/13 11:09:31    Final result by Rad Results In Interface (06/15/13 11:09:31)    Narrative:   *RADIOLOGY REPORT*  Clinical Data: Shortness of breath  CHEST - 2 VIEW  Comparison: 09/24/2012  Findings: Cardiomediastinal silhouette is stable. Elevation of the right hemidiaphragm again noted. No acute infiltrate or pulmonary edema. Bony thorax is stable.  IMPRESSION: No active disease. Elevation of the right hemidiaphragm again noted.   Original Report Authenticated By: Natasha Mead, M.D.             No diagnosis found.  MDM  Exam as above, moderate risk wells for tachycardia and recent surgery/immobilization. D. Dimer +, CTA obtained - reveals PE RUL, no hypoxia, normotensive, Cr normal - given 1mg /kg lovenox. Labs and w/u otherwise unremarkable.  Doubt ACS - low risk, atypical chest pain. ECG w/out acute ischemic changes. D/w medicine and pt admitted in stable condition.   I have personally reviewed labs and imaging and considered in my MDM. Case d/w Dr Judd Lien  1. Pulmonary emboli         Audelia Hives, MD 06/15/13 [phone]

## 2013-06-15 NOTE — H&P (Signed)
Addendum  Patient seen and examined, chart and data base reviewed.  I agree with the above assessment and plan.  For full details please see Mrs. Algis Downs PA note.  Acute PE, provoked started on Lovenox, will be started on Xarelto in a.m.  If no SOB, palpitations or local pressure can be discharged safely in a.m. on Xarelto.   Clint Lipps, MD [facility] Pager: [phone] 06/15/2013, 5:03 PM

## 2013-06-16 MED ORDER — OXYCODONE HCL 5 MG PO TABS: 5.0000 mg | ORAL_TABLET | Freq: Three times a day (TID) | ORAL | Status: DC | PRN

## 2013-06-16 MED ORDER — RIVAROXABAN 15 MG PO TABS: 15.0000 mg | ORAL_TABLET | Freq: Two times a day (BID) | ORAL | Status: DC

## 2013-06-16 NOTE — Discharge Summary (Signed)
[facility]                                                                                   John Mccarthy, is a 62 y.o. male  DOB 09-13-1951  MRN [id number].  Admission date:  06/15/2013  Discharge Date:  06/16/2013  Primary MD  TODD,JEFFREY Freida Busman, MD  Admitting Physician  Clydia Llano, MD  Admission Diagnosis  Pulmonary emboli [415.19]  Discharge Diagnosis     Active Problems:   Pulmonary embolus   History of GI bleed   H/O Achilles tendon repair    Past Medical History  Diagnosis Date  . Melanoma   . Skin cancer (melanoma)   . Pulmonary embolism 06/15/2013    Past Surgical History  Procedure Laterality Date  . Hand surgery    . Esophagogastroduodenoscopy  09/24/2012    Procedure: ESOPHAGOGASTRODUODENOSCOPY (EGD);  Surgeon: Shirley Friar, MD;  Location: [facility] ENDOSCOPY;  Service: Endoscopy;  Laterality: N/A;  . Achilles tendon surgery Right 05/18/2013     Recommendations for primary care physician for things to follow:   Switch xaralto dose after 21 days   Discharge Diagnoses:   Active Problems:   Pulmonary embolus   History of GI bleed   H/O Achilles tendon repair    Discharge Condition: Stable   Diet recommendation: See Discharge Instructions below   Consults none    History of present illness and  Hospital Course:     Kindly see H&P for history of present illness and admission details, please review complete Labs, Consult reports and Test reports for all details in brief John Mccarthy, is a 62 y.o. male, patient with history of recent right at least tendon repair by Dr. Victorino Dike few weeks ago, melanoma in the past, because her disease/GERD, presented to the ER with chief complaints of shortness of breath and his CT angiogram revealed multiple PEs, he most likely had provoked pulmonary embolism secondary to recent right foot surgery with splint placement and immobility, he has been placed on xaralto, he is symptom free with no  chest pain no shortness of breath and no oxygen requirements, he will be discharged home on xaralto along with PPI and he has history of peptic ulcer disease/GERD, will request primary care physician to kindly switch xaralto dose after 21 days, he likely needs to be on anticoagulation for at least 6 months.   Patient is otherwise healthy post discharge he will follow with PCP and his orthopedic surgeon Dr. Victorino Dike.    Today   Subjective:   Greig Castilla today has no headache,no chest abdominal pain,no new weakness tingling or numbness, feels much better wants to go home today.    Objective:   Blood pressure 122/74, pulse 69, temperature 97.9 F (36.6 C), temperature source Oral, resp. rate 18, height 6' (1.829 m), SpO2 96.00%.  No intake or output data in the 24 hours ending 06/16/13 0909  Exam Awake Alert, Oriented *3, No new F.N deficits, Normal affect [place].AT,PERRAL Supple Neck,No JVD, No cervical lymphadenopathy appriciated.  Symmetrical Chest wall movement, Good air movement bilaterally, CTAB RRR,No Gallops,Rubs or new Murmurs, No Parasternal Heave +ve B.Sounds, Abd Soft, Non tender, No  organomegaly appriciated, No rebound -guarding or rigidity. No Cyanosis, Clubbing or edema, No new Rash or bruise, wearing splint in his right foot.  Data Review   Major procedures and Radiology Reports - PLEASE review detailed and final reports for all details in brief -       Dg Chest 2 View  06/15/2013   *RADIOLOGY REPORT*  Clinical Data: Shortness of breath  CHEST - 2 VIEW  Comparison: 09/24/2012  Findings: Cardiomediastinal silhouette is stable.  Elevation of the right hemidiaphragm again noted.  No acute infiltrate or pulmonary edema.  Bony thorax is stable.  IMPRESSION: No active disease.  Elevation of the right hemidiaphragm again noted.   Original Report Authenticated By: Natasha Mead, M.D.   Ct Angio Chest Pe W/cm &/or Wo Cm  06/15/2013   *RADIOLOGY REPORT*  Clinical Data:  Shortness of breath and stabbing chest pain.  CT ANGIOGRAPHY CHEST  Technique:  Multidetector CT imaging of the chest using the standard protocol during bolus administration of intravenous contrast. Multiplanar reconstructed images including MIPs were obtained and reviewed to evaluate the vascular anatomy.  Contrast: OMNIPAQUE IOHEXOL 350 MG/ML SOLN  Comparison: No priors.  Findings:  Mediastinum: There are multiple filling defects within the right upper lobe pulmonary arterial tree involving the distal aspect of the right upper lobe pulmonary artery, as well as segmental and subsegmental sized branches.  These appear to be nonocclusive at this time.  Some of these are centrally located within the vessel lumen, while others are more eccentric suggesting some degree of chronicity.  No larger central pulmonary embolus is otherwise noted. Heart size is borderline enlarged. There is no significant pericardial fluid, thickening or pericardial calcification. No pathologically enlarged mediastinal or hilar lymph nodes. Esophagus is unremarkable in appearance.  Lungs/Pleura: Elevation of the right hemidiaphragm with some associated passive atelectasis in the right base.  There is a focal area of air trapping involving the lateral segment of the right middle lobe.  No acute consolidative airspace disease.  No pleural effusions.  No definite suspicious appearing pulmonary nodules or masses are identified at this time.  Upper Abdomen: Unremarkable.  Musculoskeletal: There are no aggressive appearing lytic or blastic lesions noted in the visualized portions of the skeleton.  IMPRESSION: 1.  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  All filling defects appear to be nonocclusive, and there is no evidence of frank pulmonary infarction at this time. 2.  Additional incidental findings, as  above.  Critical Value/emergent results were called by telephone at the time of interpretation on 06/15/2013 at 02:24 p.m. to Dr. Judd Lien, who verbally acknowledged these results.   Original Report Authenticated By: Trudie Reed, M.D.     CBC w Diff:  Lab Results  Component Value Date   WBC 8.3 06/15/2013   HGB 15.3 06/15/2013   HCT 45.3 06/15/2013   PLT 212 06/15/2013   LYMPHOPCT 13 09/23/2012   MONOPCT 7 09/23/2012   EOSPCT 0 09/23/2012   BASOPCT 0 09/23/2012    CMP:  Lab Results  Component Value Date   NA 138 06/15/2013   K 4.0 06/15/2013   CL 102 06/15/2013   CO2 24 06/15/2013   BUN 15 06/15/2013   CREATININE 1.02 06/15/2013   PROT 5.5* 09/24/2012   ALBUMIN 2.9* 09/24/2012   BILITOT 0.4 09/24/2012   ALKPHOS 68 09/24/2012   AST 15 09/24/2012   ALT  17 09/24/2012  .   Discharge Instructions     Follow with Primary MD TODD,JEFFREY ALLEN, MD in 3 days   Get CBC, CMP, checked 3 days by Primary MD and again as instructed by your Primary MD. Get a 2 view Chest X ray done next visit if you had Pneumonia of Lung problems at the Hospital.  Get Medicines reviewed and adjusted.  Please request your Prim.MD to go over all Hospital Tests and Procedure/Radiological results at the follow up, please get all Hospital records sent to your Prim MD by signing hospital release before you go home.  Activity: As tolerated with Full fall precautions use walker/cane & assistance as needed   Diet:  Heart healthy  For Heart failure patients - Check your Weight same time everyday, if you gain over 2 pounds, or you develop in leg swelling, experience more shortness of breath or chest pain, call your Primary MD immediately. Follow Cardiac Low Salt Diet and 1.8 lit/day fluid restriction.  Disposition Home    If you experience worsening of your admission symptoms, develop shortness of breath, life threatening emergency, suicidal or homicidal thoughts you must seek medical attention immediately by  calling 911 or calling your MD immediately  if symptoms less severe.  You Must read complete instructions/literature along with all the possible adverse reactions/side effects for all the Medicines you take and that have been prescribed to you. Take any new Medicines after you have completely understood and accpet all the possible adverse reactions/side effects.   Do not drive and provide baby sitting services if your were admitted for syncope or siezures until you have seen by Primary MD or a Neurologist and advised to do so again.  Do not drive when taking Pain medications.    Do not take more than prescribed Pain, Sleep and Anxiety Medications  Special Instructions: If you have smoked or chewed Tobacco  in the last 2 yrs please stop smoking, stop any regular Alcohol  and or any Recreational drug use.  Wear Seat belts while driving.   Please note  You were cared for by a hospitalist during your hospital stay. If you have any questions about your discharge medications or the care you received while you were in the hospital after you are discharged, you can call the unit and asked to speak with the hospitalist on call if the hospitalist that took care of you is not available. Once you are discharged, your primary care physician will handle any further medical issues. Please note that NO REFILLS for any discharge medications will be authorized once you are discharged, as it is imperative that you return to your primary care physician (or establish a relationship with a primary care physician if you do not have one) for your aftercare needs so that they can reassess your need for medications and monitor your lab values.    Follow-up Information   Follow up with TODD,JEFFREY ALLEN, MD. Schedule an appointment as soon as possible for a visit in 3 days.   Contact information:   [address] [place] [address] [phone]       Follow up with Toni Arthurs, MD. Schedule an  appointment as soon as possible for a visit in 3 days.   Contact information:   [address] [place] [address] [phone]         Discharge Medications     Medication List    STOP taking these medications  aspirin 81 MG tablet      TAKE these medications       oxyCODONE 5 MG immediate release tablet  Commonly known as:  Oxy IR/ROXICODONE  Take 1 tablet (5 mg total) by mouth every 8 (eight) hours as needed for pain.     pantoprazole 40 MG tablet  Commonly known as:  PROTONIX  Take 1 tablet (40 mg total) by mouth 2 (two) times daily.     Rivaroxaban 15 MG Tabs tablet  Commonly known as:  XARELTO  Take 1 tablet (15 mg total) by mouth 2 (two) times daily with a meal.           Total Time in preparing paper work, data evaluation and todays exam - 35 minutes  Leroy Sea M.D on 06/16/2013 at 9:09 AM  [facility] Office  [phone]

## 2013-06-16 NOTE — ED Provider Notes (Signed)
I saw and evaluated the patient, reviewed the resident's note and I agree with the findings and plan.  The patient presents with complaints of shortness of breath, pain in the back of his chest that started several days ago.  He underwent Achille's tendon repair one month ago and has been in a cam walker since.  He denies calf pain or increased leg swelling.    On exam, the heart rate is borderline tachycardic at 100bpm without murmurs. The vitals are otherwise unremarkable.  The lungs are clear and equal.  The abdomen is benign.  The ble are without edema.  The workup reveals an elevated d-dimer, but cardiac workup is otherwise unremarkable.  This was followed with a ct angio which was positive for pulmonary embolus.  He was given lovenox and will be admitted to the medicine service.    Geoffery Lyons, MD 06/16/13 [phone]

## 2013-06-16 NOTE — Progress Notes (Signed)
Discharge instructions along with medlist and scripts provided. Xarelto co-pay card provided via CM. IVs d/c'd withcatheter intact. Patient transported via wheelchair to lobby for d/c home.John Mccarthy

## 2013-06-16 NOTE — Care Management Note (Signed)
    Page 1 of 1   06/16/2013     2:35:47 PM   CARE MANAGEMENT NOTE 06/16/2013  Patient:  John Mccarthy, John Mccarthy   Account Number:  [id number]  Date Initiated:  06/16/2013  Documentation initiated by:  Kylyn Sookram  Subjective/Objective Assessment:   PT ADM ON 7/15 WITH PE.  PTA, PT INDEPENDENT, LIVES WITH SPOUSE.  AMBULATES WITH CRUTCHES.     Action/Plan:   CM REFERRAL FOR XARELTO.  PT HAS BCBS; ELIGIBLE FOR XARELTO CAREPATH CARD.  THIS WILL REDUCE COPAY TO $10.   Anticipated DC Date:  06/16/2013   Anticipated DC Plan:  HOME/SELF CARE      DC Planning Services  CM consult  Medication Assistance  PCP issues      Choice offered to / List presented to:             Status of service:  Completed, signed off Medicare Important Message given?   (If response is "NO", the following Medicare IM given date fields will be blank) Date Medicare IM given:   Date Additional Medicare IM given:    Discharge Disposition:  HOME/SELF CARE  Per UR Regulation:  Reviewed for med. necessity/level of care/duration of stay  If discussed at Long Length of Stay Meetings, dates discussed:    Comments:  Jilliam Bellmore,RN,BSN [phone] PT STATES HE HAS PCP PRESENTLY.  PT GIVEN PHONE # FOR [facility] REFERRAL SERVICE.  HE STATES HE WILL CALL ASAP FOR FOLLOW UP.

## 2013-06-18 ENCOUNTER — Encounter: Payer: Self-pay | Admitting: Internal Medicine

## 2013-06-18 ENCOUNTER — Other Ambulatory Visit (INDEPENDENT_AMBULATORY_CARE_PROVIDER_SITE_OTHER): Payer: BC Managed Care – PPO

## 2013-06-18 ENCOUNTER — Ambulatory Visit (INDEPENDENT_AMBULATORY_CARE_PROVIDER_SITE_OTHER): Payer: BC Managed Care – PPO | Admitting: Internal Medicine

## 2013-06-18 ENCOUNTER — Ambulatory Visit (INDEPENDENT_AMBULATORY_CARE_PROVIDER_SITE_OTHER)
Admission: RE | Admit: 2013-06-18 | Discharge: 2013-06-18 | Disposition: A | Discharge status: Home / Self Care | Payer: BC Managed Care – PPO | Source: Ambulatory Visit | Attending: Internal Medicine | Admitting: Internal Medicine

## 2013-06-18 VITALS — BP 118/78 | HR 85 | Temp 99.8°F | Resp 16 | Ht 72.0 in | Wt 206.2 lb

## 2013-06-18 DIAGNOSIS — Z0000 Encounter for general adult medical examination without abnormal findings: Secondary | ICD-10-CM

## 2013-06-18 DIAGNOSIS — I2699 Other pulmonary embolism without acute cor pulmonale: Secondary | ICD-10-CM

## 2013-06-18 DIAGNOSIS — R7989 Other specified abnormal findings of blood chemistry: Secondary | ICD-10-CM

## 2013-06-18 DIAGNOSIS — K279 Peptic ulcer, site unspecified, unspecified as acute or chronic, without hemorrhage or perforation: Secondary | ICD-10-CM

## 2013-06-18 LAB — COMPREHENSIVE METABOLIC PANEL
ALT: 36 U/L (ref 0–53)
AST: 28 U/L (ref 0–37)
BUN: 13 mg/dL (ref 6–23)
CO2: 25 mEq/L (ref 19–32)
Calcium: 9.3 mg/dL (ref 8.4–10.5)
Creatinine, Ser: 1 mg/dL (ref 0.4–1.5)
GFR: 77.25 mL/min (ref >60.00)
Total Bilirubin: 0.8 mg/dL (ref 0.3–1.2)

## 2013-06-18 LAB — URINALYSIS, ROUTINE W REFLEX MICROSCOPIC
Specific Gravity, Urine: >=1.03 (ref 1.000–1.030)
Total Protein, Urine: NEGATIVE
Urine Glucose: NEGATIVE
WBC, UA: NONE SEEN (ref 0–?)
pH: 5.5 (ref 5.0–8.0)

## 2013-06-18 LAB — CBC WITH DIFFERENTIAL/PLATELET
Basophils Absolute: 0.1 10*3/uL (ref 0.0–0.1)
Basophils Relative: 1.1 % (ref 0.0–3.0)
Eosinophils Absolute: 0.1 10*3/uL (ref 0.0–0.7)
HCT: 47.8 % (ref 39.0–52.0)
Hemoglobin: 16.3 g/dL (ref 13.0–17.0)
Lymphs Abs: 1.6 10*3/uL (ref 0.7–4.0)
MCHC: 34.1 g/dL (ref 30.0–36.0)
MCV: 87.6 fl (ref 78.0–100.0)
Neutro Abs: 6.9 10*3/uL (ref 1.4–7.7)
RBC: 5.46 Mil/uL (ref 4.22–5.81)
RDW: 14.2 % (ref 11.5–14.6)

## 2013-06-18 LAB — PSA: PSA: 0.68 ng/mL (ref 0.10–4.00)

## 2013-06-18 LAB — LIPID PANEL: Cholesterol: 211 mg/dL — ABNORMAL HIGH (ref 0–200)

## 2013-06-18 MED ORDER — PNEUMOCOCCAL VAC POLYVALENT 25 MCG/0.5ML IJ INJ: 0.5000 mL | INJECTION | INTRAMUSCULAR | Status: AC

## 2013-06-18 NOTE — Assessment & Plan Note (Addendum)
At this point will continue xarelto No work up for hypercoaguable state needed at this time Will recheck his CXR today to see if there is bleeding, effusion, etc

## 2013-06-18 NOTE — Assessment & Plan Note (Signed)
Exam done Pneumovax was given Labs ordered Pt ed material was given

## 2013-06-18 NOTE — Progress Notes (Signed)
Subjective:    Patient ID: John Mccarthy, male    DOB: [birth year], 62 y.o.   MRN: [id number]  HPI  New to me he needs a f/up after recently being diagnosed with PE that occurred after he had right achilles surgery. He feels well today with no bleeding, pain, cough, hemoptysis, or SOB.   Review of Systems  Constitutional: Negative.  Negative for fever, chills, diaphoresis, activity change, appetite change, fatigue and unexpected weight change.  HENT: Negative for nosebleeds.   Eyes: Negative.   Respiratory: Negative.  Negative for apnea, cough, choking, chest tightness, shortness of breath, wheezing and stridor.   Cardiovascular: Negative.  Negative for chest pain, palpitations and leg swelling.  Gastrointestinal: Negative.  Negative for nausea, vomiting, abdominal pain, diarrhea, constipation and anal bleeding.  Endocrine: Negative.   Genitourinary: Negative.  Negative for penile swelling, scrotal swelling, difficulty urinating and testicular pain.  Musculoskeletal: Negative.   Skin: Negative.   Allergic/Immunologic: Negative.   Neurological: Negative.   Hematological: Negative.  Negative for adenopathy. Does not bruise/bleed easily.  Psychiatric/Behavioral: Negative.        Objective:   Physical Exam  Vitals reviewed. Constitutional: He appears well-developed and well-nourished.  Non-toxic appearance. He does not have a sickly appearance. He does not appear ill. No distress.  HENT:  Head: Normocephalic and atraumatic.  Mouth/Throat: Oropharynx is clear and moist. No oropharyngeal exudate.  Eyes: Conjunctivae are normal. Right eye exhibits no discharge. Left eye exhibits no discharge. No scleral icterus.  Neck: Normal range of motion. Neck supple. No JVD present. No tracheal deviation present. No thyromegaly present.  Cardiovascular: Normal rate, regular rhythm, normal heart sounds and intact distal pulses.  Exam reveals no gallop and no friction rub.   No murmur  heard. Pulmonary/Chest: Effort normal and breath sounds normal. No stridor. No respiratory distress. He has no wheezes. He has no rales. He exhibits no tenderness.  Abdominal: Soft. Bowel sounds are normal. He exhibits no distension and no mass. There is no tenderness. There is no rebound and no guarding. Hernia confirmed negative in the right inguinal area and confirmed negative in the left inguinal area.  Genitourinary: Rectum normal, prostate normal, testes normal and penis normal. Rectal exam shows no external hemorrhoid, no internal hemorrhoid, no fissure, no mass, no tenderness and anal tone normal. Guaiac negative stool. Prostate is not enlarged and not tender. Right testis shows no mass, no swelling and no tenderness. Right testis is descended. Left testis shows no mass, no swelling and no tenderness. Left testis is descended. Circumcised. No penile erythema or penile tenderness. No discharge found.  Lymphadenopathy:    He has no cervical adenopathy.       Right: No inguinal adenopathy present.       Left: No inguinal adenopathy present.  Skin: He is not diaphoretic.     Lab Results  Component Value Date   WBC 8.3 06/15/2013   HGB 15.3 06/15/2013   HCT 45.3 06/15/2013   PLT 212 06/15/2013   GLUCOSE 95 06/15/2013   CHOL 192 11/15/2008   TRIG 190* 11/15/2008   HDL 37.3* 11/15/2008   LDLCALC 117* 11/15/2008   ALT 17 09/24/2012   AST 15 09/24/2012   NA 138 06/15/2013   K 4.0 06/15/2013   CL 102 06/15/2013   CREATININE 1.02 06/15/2013   BUN 15 06/15/2013   CO2 24 06/15/2013   TSH 2.85 11/15/2008   PSA 0.57 11/15/2008   INR 1.16 09/24/2012   HGBA1C 5.3 09/24/2012  Assessment & Plan:

## 2013-06-18 NOTE — Assessment & Plan Note (Signed)
He knows to avoid nsaids He will continue PPI therapy

## 2013-06-18 NOTE — Patient Instructions (Signed)
Health Maintenance, Males A healthy lifestyle and preventative care can promote health and wellness.  Maintain regular health, dental, and eye exams.  Eat a healthy diet. Foods like vegetables, fruits, whole grains, low-fat dairy products, and lean protein foods contain the nutrients you need without too many calories. Decrease your intake of foods high in solid fats, added sugars, and salt. Get information about a proper diet from your caregiver, if necessary.  Regular physical exercise is one of the most important things you can do for your health. Most adults should get at least 150 minutes of moderate-intensity exercise (any activity that increases your heart rate and causes you to sweat) each week. In addition, most adults need muscle-strengthening exercises on 2 or more days a week.   Maintain a healthy weight. The body mass index (BMI) is a screening tool to identify possible weight problems. It provides an estimate of body fat based on height and weight. Your caregiver can help determine your BMI, and can help you achieve or maintain a healthy weight. For adults 20 years and older:  A BMI below 18.5 is considered underweight.  A BMI of 18.5 to 24.9 is normal.  A BMI of 25 to 29.9 is considered overweight.  A BMI of 30 and above is considered obese.  Maintain normal blood lipids and cholesterol by exercising and minimizing your intake of saturated fat. Eat a balanced diet with plenty of fruits and vegetables. Blood tests for lipids and cholesterol should begin at age 20 and be repeated every 5 years. If your lipid or cholesterol levels are high, you are over 50, or you are a high risk for heart disease, you may need your cholesterol levels checked more frequently.Ongoing high lipid and cholesterol levels should be treated with medicines, if diet and exercise are not effective.  If you smoke, find out from your caregiver how to quit. If you do not use tobacco, do not start.  If you  choose to drink alcohol, do not exceed 2 drinks per day. One drink is considered to be 12 ounces (355 mL) of beer, 5 ounces (148 mL) of wine, or 1.5 ounces (44 mL) of liquor.  Avoid use of street drugs. Do not share needles with anyone. Ask for help if you need support or instructions about stopping the use of drugs.  High blood pressure causes heart disease and increases the risk of stroke. Blood pressure should be checked at least every 1 to 2 years. Ongoing high blood pressure should be treated with medicines if weight loss and exercise are not effective.  If you are 45 to 64 years old, ask your caregiver if you should take aspirin to prevent heart disease.  Diabetes screening involves taking a blood sample to check your fasting blood sugar level. This should be done once every 3 years, after age 45, if you are within normal weight and without risk factors for diabetes. Testing should be considered at a younger age or be carried out more frequently if you are overweight and have at least 1 risk factor for diabetes.  Colorectal cancer can be detected and often prevented. Most routine colorectal cancer screening begins at the age of 50 and continues through age 75. However, your caregiver may recommend screening at an earlier age if you have risk factors for colon cancer. On a yearly basis, your caregiver may provide home test kits to check for hidden blood in the stool. Use of a small camera at the end of a tube,   to directly examine the colon (sigmoidoscopy or colonoscopy), can detect the earliest forms of colorectal cancer. Talk to your caregiver about this at age 50, when routine screening begins. Direct examination of the colon should be repeated every 5 to 10 years through age 75, unless early forms of pre-cancerous polyps or small growths are found.  Hepatitis C blood testing is recommended for all people born from 1945 through 1965 and any individual with known risks for hepatitis C.  Healthy  men should no longer receive prostate-specific antigen (PSA) blood tests as part of routine cancer screening. Consult with your caregiver about prostate cancer screening.  Testicular cancer screening is not recommended for adolescents or adult males who have no symptoms. Screening includes self-exam, caregiver exam, and other screening tests. Consult with your caregiver about any symptoms you have or any concerns you have about testicular cancer.  Practice safe sex. Use condoms and avoid high-risk sexual practices to reduce the spread of sexually transmitted infections (STIs).  Use sunscreen with a sun protection factor (SPF) of 30 or greater. Apply sunscreen liberally and repeatedly throughout the day. You should seek shade when your shadow is shorter than you. Protect yourself by wearing long sleeves, pants, a wide-brimmed hat, and sunglasses year round, whenever you are outdoors.  Notify your caregiver of new moles or changes in moles, especially if there is a change in shape or color. Also notify your caregiver if a mole is larger than the size of a pencil eraser.  A one-time screening for abdominal aortic aneurysm (AAA) and surgical repair of large AAAs by sound wave imaging (ultrasonography) is recommended for ages 65 to 75 years who are current or former smokers.  Stay current with your immunizations. Document Released: 05/16/2008 Document Revised: 02/10/2012 Document Reviewed: 04/15/2011 [employer] Patient Information 2014 [employer].  

## 2013-07-05 ENCOUNTER — Telehealth: Payer: Self-pay | Admitting: *Deleted

## 2013-07-05 DIAGNOSIS — I2699 Other pulmonary embolism without acute cor pulmonale: Secondary | ICD-10-CM

## 2013-07-05 MED ORDER — XARELTO 20 MG PO TABS
20.0000 mg | ORAL_TABLET | Freq: Every day | ORAL | Status: DC
Start: 2013-07-05 — End: 2015-02-13

## 2013-07-05 NOTE — Telephone Encounter (Signed)
done

## 2013-07-05 NOTE — Telephone Encounter (Signed)
Pt called requesting refill on Xarelto.  please advise

## 2013-07-16 ENCOUNTER — Encounter: Payer: Self-pay | Admitting: Internal Medicine

## 2013-07-16 ENCOUNTER — Ambulatory Visit (INDEPENDENT_AMBULATORY_CARE_PROVIDER_SITE_OTHER): Payer: BC Managed Care – PPO | Admitting: Internal Medicine

## 2013-07-16 VITALS — BP 126/82 | HR 70 | Temp 98.6°F | Resp 16 | Ht 72.0 in | Wt 210.0 lb

## 2013-07-16 DIAGNOSIS — Z23 Encounter for immunization: Secondary | ICD-10-CM

## 2013-07-16 DIAGNOSIS — Z2911 Encounter for prophylactic immunotherapy for respiratory syncytial virus (RSV): Secondary | ICD-10-CM

## 2013-07-16 DIAGNOSIS — E785 Hyperlipidemia, unspecified: Secondary | ICD-10-CM

## 2013-07-16 DIAGNOSIS — I82409 Acute embolism and thrombosis of unspecified deep veins of unspecified lower extremity: Secondary | ICD-10-CM

## 2013-07-16 DIAGNOSIS — I82401 Acute embolism and thrombosis of unspecified deep veins of right lower extremity: Secondary | ICD-10-CM

## 2013-07-16 DIAGNOSIS — Z9889 Other specified postprocedural states: Secondary | ICD-10-CM

## 2013-07-16 MED ORDER — OXYCODONE HCL 5 MG PO TABS: 5.0000 mg | ORAL_TABLET | Freq: Three times a day (TID) | ORAL | Status: DC | PRN

## 2013-07-16 NOTE — Assessment & Plan Note (Signed)
I have ordered an u/s to see how much clot burden he has in his right calf area He is doing well on xarelto There is pain with this so he will continue oxycodone as needed

## 2013-07-16 NOTE — Patient Instructions (Signed)

## 2013-07-16 NOTE — Progress Notes (Signed)
Subjective:    Patient ID: John Mccarthy, male    DOB: May 22, 1951, 62 y.o.   MRN: [id number]  Arthritis Presents for follow-up visit. The disease course has been improving. The condition has lasted for 2 months. He complains of pain, stiffness and joint swelling. He reports no joint warmth. Affected location: right ankle. His pain is at a severity of 4/10. Associated symptoms include pain at night and pain while resting. Pertinent negatives include no diarrhea, fatigue, fever or rash. His past medical history is significant for osteoarthritis. Compliance with total regimen is 76-100%.      Review of Systems  Constitutional: Negative.  Negative for fever, chills, diaphoresis, activity change, appetite change, fatigue and unexpected weight change.  HENT: Negative.  Negative for nosebleeds.   Eyes: Negative.   Respiratory: Negative for cough, chest tightness, shortness of breath and stridor.   Cardiovascular: Positive for leg swelling (in right lower leg and ankle). Negative for chest pain and palpitations.  Gastrointestinal: Negative.  Negative for nausea, vomiting, abdominal pain, diarrhea, constipation and anal bleeding.  Endocrine: Negative.   Genitourinary: Negative.  Negative for hematuria.  Musculoskeletal: Positive for joint swelling, arthralgias, arthritis and stiffness. Negative for myalgias, back pain and gait problem.  Skin: Negative.  Negative for rash.  Allergic/Immunologic: Negative.   Neurological: Negative.  Negative for light-headedness.  Hematological: Negative.  Negative for adenopathy. Does not bruise/bleed easily.  Psychiatric/Behavioral: Negative.        Objective:   Physical Exam  Vitals reviewed. Constitutional: He is oriented to person, place, and time. He appears well-developed and well-nourished. No distress.  HENT:  Head: Normocephalic and atraumatic.  Mouth/Throat: Oropharynx is clear and moist. No oropharyngeal exudate.  Eyes: Conjunctivae are  normal. Right eye exhibits no discharge. Left eye exhibits no discharge. No scleral icterus.  Neck: Normal range of motion. Neck supple. No JVD present. No tracheal deviation present. No thyromegaly present.  Cardiovascular: Normal rate, regular rhythm, normal heart sounds and intact distal pulses.  Exam reveals no gallop and no friction rub.   No murmur heard. Pulmonary/Chest: Effort normal and breath sounds normal. No stridor. No respiratory distress. He has no wheezes. He has no rales. He exhibits no tenderness.  Abdominal: Soft. Bowel sounds are normal. He exhibits no distension and no mass. There is no tenderness. There is no rebound and no guarding.  Musculoskeletal: He exhibits edema (2+ pitting edema in his RLE). He exhibits no tenderness.       Right ankle: He exhibits swelling. He exhibits normal range of motion, no ecchymosis, no deformity and no laceration. No tenderness. Achilles tendon normal. Achilles tendon exhibits no pain, no defect and normal Thompson's test results.  Lymphadenopathy:    He has no cervical adenopathy.  Neurological: He is oriented to person, place, and time.  Skin: Skin is warm and dry. No rash noted. He is not diaphoretic. No erythema. No pallor.  Psychiatric: He has a normal mood and affect. His behavior is normal. Judgment and thought content normal.     Lab Results  Component Value Date   WBC 9.4 06/18/2013   HGB 16.3 06/18/2013   HCT 47.8 06/18/2013   PLT 226.0 06/18/2013   GLUCOSE 97 06/18/2013   CHOL 211* 06/18/2013   TRIG 232.0* 06/18/2013   HDL 41.70 06/18/2013   LDLDIRECT 151.1 06/18/2013   LDLCALC 117* 11/15/2008   ALT 36 06/18/2013   AST 28 06/18/2013   NA 139 06/18/2013   K 4.1 06/18/2013   CL 106  06/18/2013   CREATININE 1.0 06/18/2013   BUN 13 06/18/2013   CO2 25 06/18/2013   TSH 2.93 06/18/2013   PSA 0.68 06/18/2013   INR 1.16 09/24/2012   HGBA1C 5.3 09/24/2012       Assessment & Plan:

## 2013-07-16 NOTE — Assessment & Plan Note (Signed)
Will continue oxycodone as needed for pain

## 2013-07-16 NOTE — Assessment & Plan Note (Signed)
He is not willing to start a statin He will work on his lifestyle modifications

## 2013-07-22 ENCOUNTER — Encounter (INDEPENDENT_AMBULATORY_CARE_PROVIDER_SITE_OTHER): Payer: BC Managed Care – PPO

## 2013-07-22 DIAGNOSIS — I82401 Acute embolism and thrombosis of unspecified deep veins of right lower extremity: Secondary | ICD-10-CM

## 2013-07-22 DIAGNOSIS — M7989 Other specified soft tissue disorders: Secondary | ICD-10-CM

## 2013-07-27 ENCOUNTER — Encounter: Payer: Self-pay | Admitting: Internal Medicine

## 2013-10-07 ENCOUNTER — Other Ambulatory Visit: Payer: Self-pay

## 2013-10-18 ENCOUNTER — Other Ambulatory Visit (INDEPENDENT_AMBULATORY_CARE_PROVIDER_SITE_OTHER): Payer: BC Managed Care – PPO

## 2013-10-18 ENCOUNTER — Encounter: Payer: Self-pay | Admitting: Internal Medicine

## 2013-10-18 ENCOUNTER — Ambulatory Visit (INDEPENDENT_AMBULATORY_CARE_PROVIDER_SITE_OTHER): Payer: BC Managed Care – PPO | Admitting: Internal Medicine

## 2013-10-18 VITALS — BP 128/86 | HR 73 | Temp 98.0°F | Resp 16 | Ht 72.0 in | Wt 213.0 lb

## 2013-10-18 DIAGNOSIS — E785 Hyperlipidemia, unspecified: Secondary | ICD-10-CM

## 2013-10-18 DIAGNOSIS — Z23 Encounter for immunization: Secondary | ICD-10-CM

## 2013-10-18 LAB — LIPID PANEL
HDL: 36.6 mg/dL — ABNORMAL LOW (ref >39.00)
Total CHOL/HDL Ratio: 6
VLDL: 52.4 mg/dL — ABNORMAL HIGH (ref 0.0–40.0)

## 2013-10-18 LAB — LDL CHOLESTEROL, DIRECT: Direct LDL: 146.5 mg/dL

## 2013-10-18 MED ORDER — ATORVASTATIN CALCIUM 40 MG PO TABS: 40.0000 mg | ORAL_TABLET | Freq: Every day | ORAL | Status: DC

## 2013-10-18 NOTE — Patient Instructions (Signed)

## 2013-10-18 NOTE — Progress Notes (Signed)
Pre visit review using our clinic review tool, if applicable. No additional management support is needed unless otherwise documented below in the visit note. 

## 2013-10-18 NOTE — Assessment & Plan Note (Signed)
LDL is still >130 so I have asked him to start atorvastatin

## 2013-10-18 NOTE — Progress Notes (Signed)
Subjective:    Patient ID: John Mccarthy, male    DOB: 07/23/1951, 62 y.o.   MRN: [id number]  Hyperlipidemia This is a chronic problem. The current episode started more than 1 year ago. The problem is uncontrolled. Recent lipid tests were reviewed and are variable. Exacerbating diseases include obesity. He has no history of chronic renal disease, diabetes, hypothyroidism, liver disease or nephrotic syndrome. Factors aggravating his hyperlipidemia include fatty foods. Pertinent negatives include no chest pain, focal sensory loss, focal weakness, leg pain, myalgias or shortness of breath. He is currently on no antihyperlipidemic treatment. The current treatment provides no improvement of lipids. Compliance problems include adherence to diet and adherence to exercise.  Risk factors for coronary artery disease include male sex.      Review of Systems  Constitutional: Negative.  Negative for fever, chills, diaphoresis, appetite change and fatigue.  HENT: Negative.   Eyes: Negative.   Respiratory: Negative.  Negative for apnea, cough, choking, chest tightness, shortness of breath, wheezing and stridor.   Cardiovascular: Negative.  Negative for chest pain, palpitations and leg swelling.  Gastrointestinal: Negative.  Negative for nausea, vomiting, abdominal pain, diarrhea, constipation and blood in stool.  Endocrine: Negative.   Genitourinary: Negative.   Musculoskeletal: Negative.  Negative for arthralgias, back pain, gait problem, joint swelling, myalgias, neck pain and neck stiffness.  Skin: Negative.   Allergic/Immunologic: Negative.   Neurological: Negative.  Negative for dizziness, tremors, focal weakness, weakness, light-headedness and numbness.  Hematological: Negative.  Negative for adenopathy. Does not bruise/bleed easily.  Psychiatric/Behavioral: Negative.        Objective:   Physical Exam  Vitals reviewed. Constitutional: He is oriented to person, place, and time. He appears  well-developed and well-nourished. No distress.  HENT:  Head: Normocephalic and atraumatic.  Mouth/Throat: Oropharynx is clear and moist. No oropharyngeal exudate.  Eyes: Conjunctivae are normal. Right eye exhibits no discharge. Left eye exhibits no discharge. No scleral icterus.  Neck: Normal range of motion. Neck supple. No JVD present. No tracheal deviation present. No thyromegaly present.  Cardiovascular: Normal rate, regular rhythm, normal heart sounds and intact distal pulses.  Exam reveals no gallop and no friction rub.   No murmur heard. Pulmonary/Chest: Effort normal and breath sounds normal. No stridor. No respiratory distress. He has no wheezes. He has no rales. He exhibits no tenderness.  Abdominal: Soft. Bowel sounds are normal. He exhibits no distension and no mass. There is no tenderness. There is no rebound and no guarding.  Musculoskeletal: Normal range of motion. He exhibits edema (trace edema in BLE). He exhibits no tenderness.  Lymphadenopathy:    He has no cervical adenopathy.  Neurological: He is oriented to person, place, and time.  Skin: Skin is warm and dry. No rash noted. He is not diaphoretic. No erythema. No pallor.  Psychiatric: He has a normal mood and affect. His behavior is normal. Judgment and thought content normal.     Lab Results  Component Value Date   WBC 9.4 06/18/2013   HGB 16.3 06/18/2013   HCT 47.8 06/18/2013   PLT 226.0 06/18/2013   GLUCOSE 97 06/18/2013   CHOL 211* 06/18/2013   TRIG 232.0* 06/18/2013   HDL 41.70 06/18/2013   LDLDIRECT 151.1 06/18/2013   LDLCALC 117* 11/15/2008   ALT 36 06/18/2013   AST 28 06/18/2013   NA 139 06/18/2013   K 4.1 06/18/2013   CL 106 06/18/2013   CREATININE 1.0 06/18/2013   BUN 13 06/18/2013   CO2 25 06/18/2013  TSH 2.93 06/18/2013   PSA 0.68 06/18/2013   INR 1.16 09/24/2012   HGBA1C 5.3 09/24/2012       Assessment & Plan:

## 2013-10-21 ENCOUNTER — Ambulatory Visit: Payer: BC Managed Care – PPO | Admitting: Internal Medicine

## 2013-12-30 ENCOUNTER — Telehealth: Payer: Self-pay

## 2013-12-30 NOTE — Telephone Encounter (Signed)
It is time for him to stop taking it

## 2013-12-30 NOTE — Telephone Encounter (Signed)
Either way is fine - this is not that precise

## 2013-12-30 NOTE — Telephone Encounter (Signed)
Phone call from patient stating he has 2 weeks worth left of Xarelto. He is asking will he need to continue after this? Do labs need to be done?

## 2013-12-30 NOTE — Telephone Encounter (Signed)
Patient has been notified

## 2013-12-30 NOTE — Telephone Encounter (Signed)
Just so I am clear when I call him back does he is to stop immediately today or just be done after the 2 weeks.

## 2014-05-03 DIAGNOSIS — C44319 Basal cell carcinoma of skin of other parts of face: Secondary | ICD-10-CM | POA: Diagnosis not present

## 2014-05-03 DIAGNOSIS — L089 Local infection of the skin and subcutaneous tissue, unspecified: Secondary | ICD-10-CM | POA: Diagnosis not present

## 2014-05-03 DIAGNOSIS — Z85828 Personal history of other malignant neoplasm of skin: Secondary | ICD-10-CM | POA: Diagnosis not present

## 2014-08-23 DIAGNOSIS — Z8711 Personal history of peptic ulcer disease: Secondary | ICD-10-CM | POA: Diagnosis not present

## 2014-08-23 DIAGNOSIS — R1013 Epigastric pain: Secondary | ICD-10-CM | POA: Diagnosis not present

## 2014-08-23 DIAGNOSIS — K3189 Other diseases of stomach and duodenum: Secondary | ICD-10-CM | POA: Diagnosis not present

## 2014-08-25 DIAGNOSIS — Z85828 Personal history of other malignant neoplasm of skin: Secondary | ICD-10-CM | POA: Diagnosis not present

## 2014-08-25 DIAGNOSIS — L910 Hypertrophic scar: Secondary | ICD-10-CM | POA: Diagnosis not present

## 2014-08-25 DIAGNOSIS — D235 Other benign neoplasm of skin of trunk: Secondary | ICD-10-CM | POA: Diagnosis not present

## 2014-08-25 DIAGNOSIS — L821 Other seborrheic keratosis: Secondary | ICD-10-CM | POA: Diagnosis not present

## 2014-08-25 DIAGNOSIS — Z85820 Personal history of malignant melanoma of skin: Secondary | ICD-10-CM | POA: Diagnosis not present

## 2014-08-25 DIAGNOSIS — L570 Actinic keratosis: Secondary | ICD-10-CM | POA: Diagnosis not present

## 2014-12-06 DIAGNOSIS — M7662 Achilles tendinitis, left leg: Secondary | ICD-10-CM | POA: Diagnosis not present

## 2014-12-17 ENCOUNTER — Other Ambulatory Visit: Payer: Self-pay | Admitting: Internal Medicine

## 2014-12-27 DIAGNOSIS — M7662 Achilles tendinitis, left leg: Secondary | ICD-10-CM | POA: Diagnosis not present

## 2014-12-27 DIAGNOSIS — S86812D Strain of other muscle(s) and tendon(s) at lower leg level, left leg, subsequent encounter: Secondary | ICD-10-CM | POA: Diagnosis not present

## 2015-01-17 DIAGNOSIS — M7662 Achilles tendinitis, left leg: Secondary | ICD-10-CM | POA: Diagnosis not present

## 2015-01-17 DIAGNOSIS — S86812D Strain of other muscle(s) and tendon(s) at lower leg level, left leg, subsequent encounter: Secondary | ICD-10-CM | POA: Diagnosis not present

## 2015-02-10 ENCOUNTER — Telehealth: Payer: Self-pay | Admitting: Internal Medicine

## 2015-02-10 MED ORDER — ATORVASTATIN CALCIUM 40 MG PO TABS: 40.0000 mg | ORAL_TABLET | Freq: Every day | ORAL | Status: DC

## 2015-02-10 NOTE — Telephone Encounter (Signed)
Patient has scheduled a cpe for Monday.  He is out of atorvastatin.  He is requesting a refill to be sent to [facility] on [address] to get him through.

## 2015-02-10 NOTE — Telephone Encounter (Signed)
Done

## 2015-02-13 ENCOUNTER — Encounter: Payer: Self-pay | Admitting: Internal Medicine

## 2015-02-13 ENCOUNTER — Other Ambulatory Visit (INDEPENDENT_AMBULATORY_CARE_PROVIDER_SITE_OTHER): Payer: Medicare Other

## 2015-02-13 ENCOUNTER — Ambulatory Visit (INDEPENDENT_AMBULATORY_CARE_PROVIDER_SITE_OTHER): Payer: Medicare Other | Admitting: Internal Medicine

## 2015-02-13 VITALS — BP 128/80 | HR 75 | Temp 97.8°F | Resp 16 | Ht 72.0 in | Wt 216.0 lb

## 2015-02-13 DIAGNOSIS — N400 Enlarged prostate without lower urinary tract symptoms: Secondary | ICD-10-CM

## 2015-02-13 DIAGNOSIS — E785 Hyperlipidemia, unspecified: Secondary | ICD-10-CM | POA: Diagnosis not present

## 2015-02-13 DIAGNOSIS — R609 Edema, unspecified: Secondary | ICD-10-CM | POA: Diagnosis not present

## 2015-02-13 DIAGNOSIS — Z0000 Encounter for general adult medical examination without abnormal findings: Secondary | ICD-10-CM

## 2015-02-13 LAB — CBC WITH DIFFERENTIAL/PLATELET
Basophils Absolute: 0 10*3/uL (ref 0.0–0.1)
Basophils Relative: 0.4 % (ref 0.0–3.0)
EOS ABS: 0.1 10*3/uL (ref 0.0–0.7)
Eosinophils Relative: 1.9 % (ref 0.0–5.0)
HCT: 46.5 % (ref 39.0–52.0)
Hemoglobin: 16 g/dL (ref 13.0–17.0)
Lymphocytes Relative: 20.5 % (ref 12.0–46.0)
Lymphs Abs: 1.5 10*3/uL (ref 0.7–4.0)
MCHC: 34.3 g/dL (ref 30.0–36.0)
MCV: 87.6 fl (ref 78.0–100.0)
MONO ABS: 0.5 10*3/uL (ref 0.1–1.0)
Monocytes Relative: 7.2 % (ref 3.0–12.0)
Neutro Abs: 5.2 10*3/uL (ref 1.4–7.7)
Neutrophils Relative %: 70 % (ref 43.0–77.0)
PLATELETS: 220 10*3/uL (ref 150.0–400.0)
RBC: 5.31 Mil/uL (ref 4.22–5.81)
RDW: 14.1 % (ref 11.5–15.5)
WBC: 7.4 10*3/uL (ref 4.0–10.5)

## 2015-02-13 LAB — COMPREHENSIVE METABOLIC PANEL
ALK PHOS: 98 U/L (ref 39–117)
ALT: 28 U/L (ref 0–53)
AST: 24 U/L (ref 0–37)
Albumin: 4.1 g/dL (ref 3.5–5.2)
BUN: 10 mg/dL (ref 6–23)
CALCIUM: 9.2 mg/dL (ref 8.4–10.5)
CO2: 27 mEq/L (ref 19–32)
CREATININE: 0.99 mg/dL (ref 0.40–1.50)
Chloride: 106 mEq/L (ref 96–112)
GFR: 80.45 mL/min (ref >60.00)
Glucose, Bld: 107 mg/dL — ABNORMAL HIGH (ref 70–99)
POTASSIUM: 4 meq/L (ref 3.5–5.1)
Sodium: 139 mEq/L (ref 135–145)
Total Bilirubin: 0.9 mg/dL (ref 0.2–1.2)
Total Protein: 6.7 g/dL (ref 6.0–8.3)

## 2015-02-13 LAB — LIPID PANEL
CHOL/HDL RATIO: 3
Cholesterol: 126 mg/dL (ref 0–200)
HDL: 38.4 mg/dL — AB (ref >39.00)
LDL CALC: 60 mg/dL (ref 0–99)
NONHDL: 87.6
Triglycerides: 136 mg/dL (ref 0.0–149.0)
VLDL: 27.2 mg/dL (ref 0.0–40.0)

## 2015-02-13 LAB — PSA: PSA: 0.63 ng/mL (ref 0.10–4.00)

## 2015-02-13 LAB — FECAL OCCULT BLOOD, GUAIAC: FECAL OCCULT BLD: NEGATIVE

## 2015-02-13 LAB — D-DIMER, QUANTITATIVE: D-Dimer, Quant: 0.28 ug/mL-FEU (ref 0.00–0.48)

## 2015-02-13 NOTE — Assessment & Plan Note (Signed)
Will check his PSA to screen for cancer He has no s.s that need to be treated

## 2015-02-13 NOTE — Assessment & Plan Note (Signed)
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

## 2015-02-13 NOTE — Assessment & Plan Note (Signed)

## 2015-02-13 NOTE — Progress Notes (Signed)
Subjective:    Patient ID: John Mccarthy, male    DOB: 1951-08-25, 64 y.o.   MRN: [id number]  HPI Comments: He comes in for a physical but he also complains that his legs feel a little swollen after he eats a heavy load of sodium (ie. a hamburger and margarita)  Hyperlipidemia This is a chronic problem. The current episode started more than 1 year ago. The problem is controlled. Recent lipid tests were reviewed and are variable. He has no history of chronic renal disease, diabetes, hypothyroidism, liver disease, obesity or nephrotic syndrome. There are no known factors aggravating his hyperlipidemia. Pertinent negatives include no chest pain, focal sensory loss, focal weakness, leg pain, myalgias or shortness of breath. Current antihyperlipidemic treatment includes statins. The current treatment provides significant improvement of lipids. Compliance problems include medication side effects and adherence to diet (he thinks lipitor has caused hair loss).       Review of Systems  Constitutional: Negative.  Negative for fever, chills, diaphoresis, appetite change and fatigue.  HENT: Negative.   Eyes: Negative.   Respiratory: Negative.  Negative for apnea, cough, choking, chest tightness, shortness of breath, wheezing and stridor.   Cardiovascular: Positive for leg swelling. Negative for chest pain and palpitations.  Gastrointestinal: Negative.  Negative for nausea, vomiting, abdominal pain, diarrhea, constipation and blood in stool.  Endocrine: Negative.   Genitourinary: Negative.   Musculoskeletal: Negative.  Negative for myalgias.  Skin: Negative.   Allergic/Immunologic: Negative.   Neurological: Negative.  Negative for dizziness, focal weakness, syncope, speech difficulty, light-headedness, numbness and headaches.  Hematological: Negative.  Negative for adenopathy. Does not bruise/bleed easily.       Objective:   Physical Exam  Constitutional: He is oriented to person, place,  and time. He appears well-developed and well-nourished. No distress.  HENT:  Head: Normocephalic and atraumatic.  Mouth/Throat: Oropharynx is clear and moist. No oropharyngeal exudate.  Eyes: Conjunctivae are normal. Right eye exhibits no discharge. Left eye exhibits no discharge. No scleral icterus.  Neck: Normal range of motion. Neck supple. No JVD present. No tracheal deviation present. No thyromegaly present.  Cardiovascular: Normal rate, regular rhythm, normal heart sounds and intact distal pulses.  Exam reveals no gallop and no friction rub.   No murmur heard. Pulmonary/Chest: Effort normal and breath sounds normal. No stridor. No respiratory distress. He has no wheezes. He has no rales. He exhibits no tenderness.  Abdominal: Soft. Bowel sounds are normal. He exhibits no distension and no mass. There is no tenderness. There is no rebound and no guarding. Hernia confirmed negative in the right inguinal area and confirmed negative in the left inguinal area.  Genitourinary: Rectum normal, testes normal and penis normal. Rectal exam shows no external hemorrhoid, no internal hemorrhoid, no fissure, no mass, no tenderness and anal tone normal. Guaiac negative stool. Prostate is enlarged (1+ smooth symm BPH). Prostate is not tender. Right testis shows no mass, no swelling and no tenderness. Right testis is descended. Left testis shows no mass, no swelling and no tenderness. Left testis is descended. Circumcised. No penile erythema or penile tenderness. No discharge found.  Musculoskeletal: Normal range of motion. He exhibits edema (trace edema in BLE). He exhibits no tenderness.  Lymphadenopathy:    He has no cervical adenopathy.       Right: No inguinal adenopathy present.       Left: No inguinal adenopathy present.  Neurological: He is oriented to person, place, and time.  Skin: Skin is warm and  dry. No rash noted. He is not diaphoretic. No erythema. No pallor.  Psychiatric: He has a normal mood  and affect. His behavior is normal. Judgment and thought content normal.  Vitals reviewed.    Lab Results  Component Value Date   WBC 9.4 06/18/2013   HGB 16.3 06/18/2013   HCT 47.8 06/18/2013   PLT 226.0 06/18/2013   GLUCOSE 97 06/18/2013   CHOL 206* 10/18/2013   TRIG 262.0* 10/18/2013   HDL 36.60* 10/18/2013   LDLDIRECT 146.5 10/18/2013   LDLCALC 117* 11/15/2008   ALT 36 06/18/2013   AST 28 06/18/2013   NA 139 06/18/2013   K 4.1 06/18/2013   CL 106 06/18/2013   CREATININE 1.0 06/18/2013   BUN 13 06/18/2013   CO2 25 06/18/2013   TSH 2.93 06/18/2013   PSA 0.68 06/18/2013   INR 1.16 09/24/2012   HGBA1C 5.3 09/24/2012       Assessment & Plan:

## 2015-02-13 NOTE — Assessment & Plan Note (Signed)
He has decided not to take the statin, will hold for now Will recheck his FLP today

## 2015-02-13 NOTE — Patient Instructions (Signed)
Edema Edema is an abnormal buildup of fluids in your bodytissues. Edema is somewhatdependent on gravity to pull the fluid to the lowest place in your body. That makes the condition more common in the legs and thighs (lower extremities). Painless swelling of the feet and ankles is common and becomes more likely as you get older. It is also common in looser tissues, like around your eyes.  When the affected area is squeezed, the fluid may move out of that spot and leave a dent for a few moments. This dent is called pitting.  CAUSES  There are many possible causes of edema. Eating too much salt and being on your feet or sitting for a long time can cause edema in your legs and ankles. Hot weather may make edema worse. Common medical causes of edema include:  Heart failure.  Liver disease.  Kidney disease.  Weak blood vessels in your legs.  Cancer.  An injury.  Pregnancy.  Some medications.  Obesity. SYMPTOMS  Edema is usually painless.Your skin may look swollen or shiny.  DIAGNOSIS  Your health care provider may be able to diagnose edema by asking about your medical history and doing a physical exam. You may need to have tests such as X-rays, an electrocardiogram, or blood tests to check for medical conditions that may cause edema.  TREATMENT  Edema treatment depends on the cause. If you have heart, liver, or kidney disease, you need the treatment appropriate for these conditions. General treatment may include:  Elevation of the affected body part above the level of your heart.  Compression of the affected body part. Pressure from elastic bandages or support stockings squeezes the tissues and forces fluid back into the blood vessels. This keeps fluid from entering the tissues.  Restriction of fluid and salt intake.  Use of a water pill (diuretic). These medications are appropriate only for some types of edema. They pull fluid out of your body and make you urinate more often. This  gets rid of fluid and reduces swelling, but diuretics can have side effects. Only use diuretics as directed by your health care provider. HOME CARE INSTRUCTIONS   Keep the affected body part above the level of your heart when you are lying down.   Do not sit still or stand for prolonged periods.   Do not put anything directly under your knees when lying down.  Do not wear constricting clothing or garters on your upper legs.   Exercise your legs to work the fluid back into your blood vessels. This may help the swelling go down.   Wear elastic bandages or support stockings to reduce ankle swelling as directed by your health care provider.   Eat a low-salt diet to reduce fluid if your health care provider recommends it.   Only take medicines as directed by your health care provider. SEEK MEDICAL CARE IF:   Your edema is not responding to treatment.  You have heart, liver, or kidney disease and notice symptoms of edema.  You have edema in your legs that does not improve after elevating them.   You have sudden and unexplained weight gain. SEEK IMMEDIATE MEDICAL CARE IF:   You develop shortness of breath or chest pain.   You cannot breathe when you lie down.  You develop pain, redness, or warmth in the swollen areas.   You have heart, liver, or kidney disease and suddenly get edema.  You have a fever and your symptoms suddenly get worse. MAKE SURE YOU:     Understand these instructions.  Will watch your condition.  Will get help right away if you are not doing well or get worse. Document Released: 11/18/2005 Document Revised: 04/04/2014 Document Reviewed: 09/10/2013 [facility] Patient Information 2015 [place], [place]. This information is not intended to replace advice given to you by your health care provider. Make sure you discuss any questions you have with your health care provider. Health Maintenance A healthy lifestyle and preventative care can promote health and  wellness.  Maintain regular health, dental, and eye exams.  Eat a healthy diet. Foods like vegetables, fruits, whole grains, low-fat dairy products, and lean protein foods contain the nutrients you need and are low in calories. Decrease your intake of foods high in solid fats, added sugars, and salt. Get information about a proper diet from your health care provider, if necessary.  Regular physical exercise is one of the most important things you can do for your health. Most adults should get at least 150 minutes of moderate-intensity exercise (any activity that increases your heart rate and causes you to sweat) each week. In addition, most adults need muscle-strengthening exercises on 2 or more days a week.   Maintain a healthy weight. The body mass index (BMI) is a screening tool to identify possible weight problems. It provides an estimate of body fat based on height and weight. Your health care provider can find your BMI and can help you achieve or maintain a healthy weight. For males 20 years and older:  A BMI below 18.5 is considered underweight.  A BMI of 18.5 to 24.9 is normal.  A BMI of 25 to 29.9 is considered overweight.  A BMI of 30 and above is considered obese.  Maintain normal blood lipids and cholesterol by exercising and minimizing your intake of saturated fat. Eat a balanced diet with plenty of fruits and vegetables. Blood tests for lipids and cholesterol should begin at age 5 and be repeated every 5 years. If your lipid or cholesterol levels are high, you are over age 57, or you are at high risk for heart disease, you may need your cholesterol levels checked more frequently.Ongoing high lipid and cholesterol levels should be treated with medicines if diet and exercise are not working.  If you smoke, find out from your health care provider how to quit. If you do not use tobacco, do not start.  Lung cancer screening is recommended for adults aged 71-80 years who are at high  risk for developing lung cancer because of a history of smoking. A yearly low-dose CT scan of the lungs is recommended for people who have at least a 30-pack-year history of smoking and are current smokers or have quit within the past 15 years. A pack year of smoking is smoking an average of 1 pack of cigarettes a day for 1 year (for example, a 30-pack-year history of smoking could mean smoking 1 pack a day for 30 years or 2 packs a day for 15 years). Yearly screening should continue until the smoker has stopped smoking for at least 15 years. Yearly screening should be stopped for people who develop a health problem that would prevent them from having lung cancer treatment.  If you choose to drink alcohol, do not have more than 2 drinks per day. One drink is considered to be 12 oz (360 mL) of beer, 5 oz (150 mL) of wine, or 1.5 oz (45 mL) of liquor.  Avoid the use of street drugs. Do not share needles with anyone.  Ask for help if you need support or instructions about stopping the use of drugs.  High blood pressure causes heart disease and increases the risk of stroke. Blood pressure should be checked at least every 1-2 years. Ongoing high blood pressure should be treated with medicines if weight loss and exercise are not effective.  If you are 53-68 years old, ask your health care provider if you should take aspirin to prevent heart disease.  Diabetes screening involves taking a blood sample to check your fasting blood sugar level. This should be done once every 3 years after age 40 if you are at a normal weight and without risk factors for diabetes. Testing should be considered at a younger age or be carried out more frequently if you are overweight and have at least 1 risk factor for diabetes.  Colorectal cancer can be detected and often prevented. Most routine colorectal cancer screening begins at the age of 64 and continues through age 58. However, your health care provider may recommend screening  at an earlier age if you have risk factors for colon cancer. On a yearly basis, your health care provider may provide home test kits to check for hidden blood in the stool. A small camera at the end of a tube may be used to directly examine the colon (sigmoidoscopy or colonoscopy) to detect the earliest forms of colorectal cancer. Talk to your health care provider about this at age 86 when routine screening begins. A direct exam of the colon should be repeated every 5-10 years through age 82, unless early forms of precancerous polyps or small growths are found.  People who are at an increased risk for hepatitis B should be screened for this virus. You are considered at high risk for hepatitis B if:  You were born in a country where hepatitis B occurs often. Talk with your health care provider about which countries are considered high risk.  Your parents were born in a high-risk country and you have not received a shot to protect against hepatitis B (hepatitis B vaccine).  You have HIV or AIDS.  You use needles to inject street drugs.  You live with, or have sex with, someone who has hepatitis B.  You are a man who has sex with other men (MSM).  You get hemodialysis treatment.  You take certain medicines for conditions like cancer, organ transplantation, and autoimmune conditions.  Hepatitis C blood testing is recommended for all people born from [birth year] through 1967 and any individual with known risk factors for hepatitis C.  Healthy men should no longer receive prostate-specific antigen (PSA) blood tests as part of routine cancer screening. Talk to your health care provider about prostate cancer screening.  Testicular cancer screening is not recommended for adolescents or adult males who have no symptoms. Screening includes self-exam, a health care provider exam, and other screening tests. Consult with your health care provider about any symptoms you have or any concerns you have about  testicular cancer.  Practice safe sex. Use condoms and avoid high-risk sexual practices to reduce the spread of sexually transmitted infections (STIs).  You should be screened for STIs, including gonorrhea and chlamydia if:  You are sexually active and are younger than 24 years.  You are older than 24 years, and your health care provider tells you that you are at risk for this type of infection.  Your sexual activity has changed since you were last screened, and you are at an increased risk for chlamydia  or gonorrhea. Ask your health care provider if you are at risk.  If you are at risk of being infected with HIV, it is recommended that you take a prescription medicine daily to prevent HIV infection. This is called pre-exposure prophylaxis (PrEP). You are considered at risk if:  You are a man who has sex with other men (MSM).  You are a heterosexual man who is sexually active with multiple partners.  You take drugs by injection.  You are sexually active with a partner who has HIV.  Talk with your health care provider about whether you are at high risk of being infected with HIV. If you choose to begin PrEP, you should first be tested for HIV. You should then be tested every 3 months for as long as you are taking PrEP.  Use sunscreen. Apply sunscreen liberally and repeatedly throughout the day. You should seek shade when your shadow is shorter than you. Protect yourself by wearing long sleeves, pants, a wide-brimmed hat, and sunglasses year round whenever you are outdoors.  Tell your health care provider of new moles or changes in moles, especially if there is a change in shape or color. Also, tell your health care provider if a mole is larger than the size of a pencil eraser.  A one-time screening for abdominal aortic aneurysm (AAA) and surgical repair of large AAAs by ultrasound is recommended for men aged 41-75 years who are current or former smokers.  Stay current with your vaccines  (immunizations). Document Released: 05/16/2008 Document Revised: 11/23/2013 Document Reviewed: 04/15/2011 [facility] Patient Information 2015 [place], [place]. This information is not intended to replace advice given to you by your health care provider. Make sure you discuss any questions you have with your health care provider.

## 2015-02-13 NOTE — Progress Notes (Signed)
Pre visit review using our clinic review tool, if applicable. No additional management support is needed unless otherwise documented below in the visit note. 

## 2015-02-14 ENCOUNTER — Encounter: Payer: Self-pay | Admitting: Internal Medicine

## 2015-02-23 DIAGNOSIS — D2272 Melanocytic nevi of left lower limb, including hip: Secondary | ICD-10-CM | POA: Diagnosis not present

## 2015-02-23 DIAGNOSIS — L819 Disorder of pigmentation, unspecified: Secondary | ICD-10-CM | POA: Diagnosis not present

## 2015-02-23 DIAGNOSIS — D2261 Melanocytic nevi of right upper limb, including shoulder: Secondary | ICD-10-CM | POA: Diagnosis not present

## 2015-02-23 DIAGNOSIS — Z85820 Personal history of malignant melanoma of skin: Secondary | ICD-10-CM | POA: Diagnosis not present

## 2015-02-23 DIAGNOSIS — Z85828 Personal history of other malignant neoplasm of skin: Secondary | ICD-10-CM | POA: Diagnosis not present

## 2015-02-23 DIAGNOSIS — C44519 Basal cell carcinoma of skin of other part of trunk: Secondary | ICD-10-CM | POA: Diagnosis not present

## 2015-02-23 DIAGNOSIS — D225 Melanocytic nevi of trunk: Secondary | ICD-10-CM | POA: Diagnosis not present

## 2015-02-23 DIAGNOSIS — D2271 Melanocytic nevi of right lower limb, including hip: Secondary | ICD-10-CM | POA: Diagnosis not present

## 2015-02-23 DIAGNOSIS — L814 Other melanin hyperpigmentation: Secondary | ICD-10-CM | POA: Diagnosis not present

## 2015-02-23 DIAGNOSIS — D1801 Hemangioma of skin and subcutaneous tissue: Secondary | ICD-10-CM | POA: Diagnosis not present

## 2015-02-23 DIAGNOSIS — D2262 Melanocytic nevi of left upper limb, including shoulder: Secondary | ICD-10-CM | POA: Diagnosis not present

## 2015-02-23 DIAGNOSIS — D485 Neoplasm of uncertain behavior of skin: Secondary | ICD-10-CM | POA: Diagnosis not present

## 2015-03-17 DIAGNOSIS — Z85828 Personal history of other malignant neoplasm of skin: Secondary | ICD-10-CM | POA: Diagnosis not present

## 2015-03-17 DIAGNOSIS — C44519 Basal cell carcinoma of skin of other part of trunk: Secondary | ICD-10-CM | POA: Diagnosis not present

## 2015-04-04 ENCOUNTER — Telehealth: Payer: Self-pay | Admitting: Internal Medicine

## 2015-04-04 NOTE — Telephone Encounter (Signed)
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

## 2015-04-04 NOTE — Telephone Encounter (Signed)
He needs to remind me of this about one month prior to the surgery

## 2015-04-05 NOTE — Telephone Encounter (Signed)
Notified pt with md response he stated it not for him its for his wife but will call back 1 month prior to surgery date...John Mccarthy

## 2015-04-15 IMAGING — CR DG CHEST 2V
1 series · 1 of 1 positions shown · non-contrast
Comparison: 09/24/2012

CLINICAL DATA: Shortness of breath

CHEST - 2 VIEW

[view not recorded]
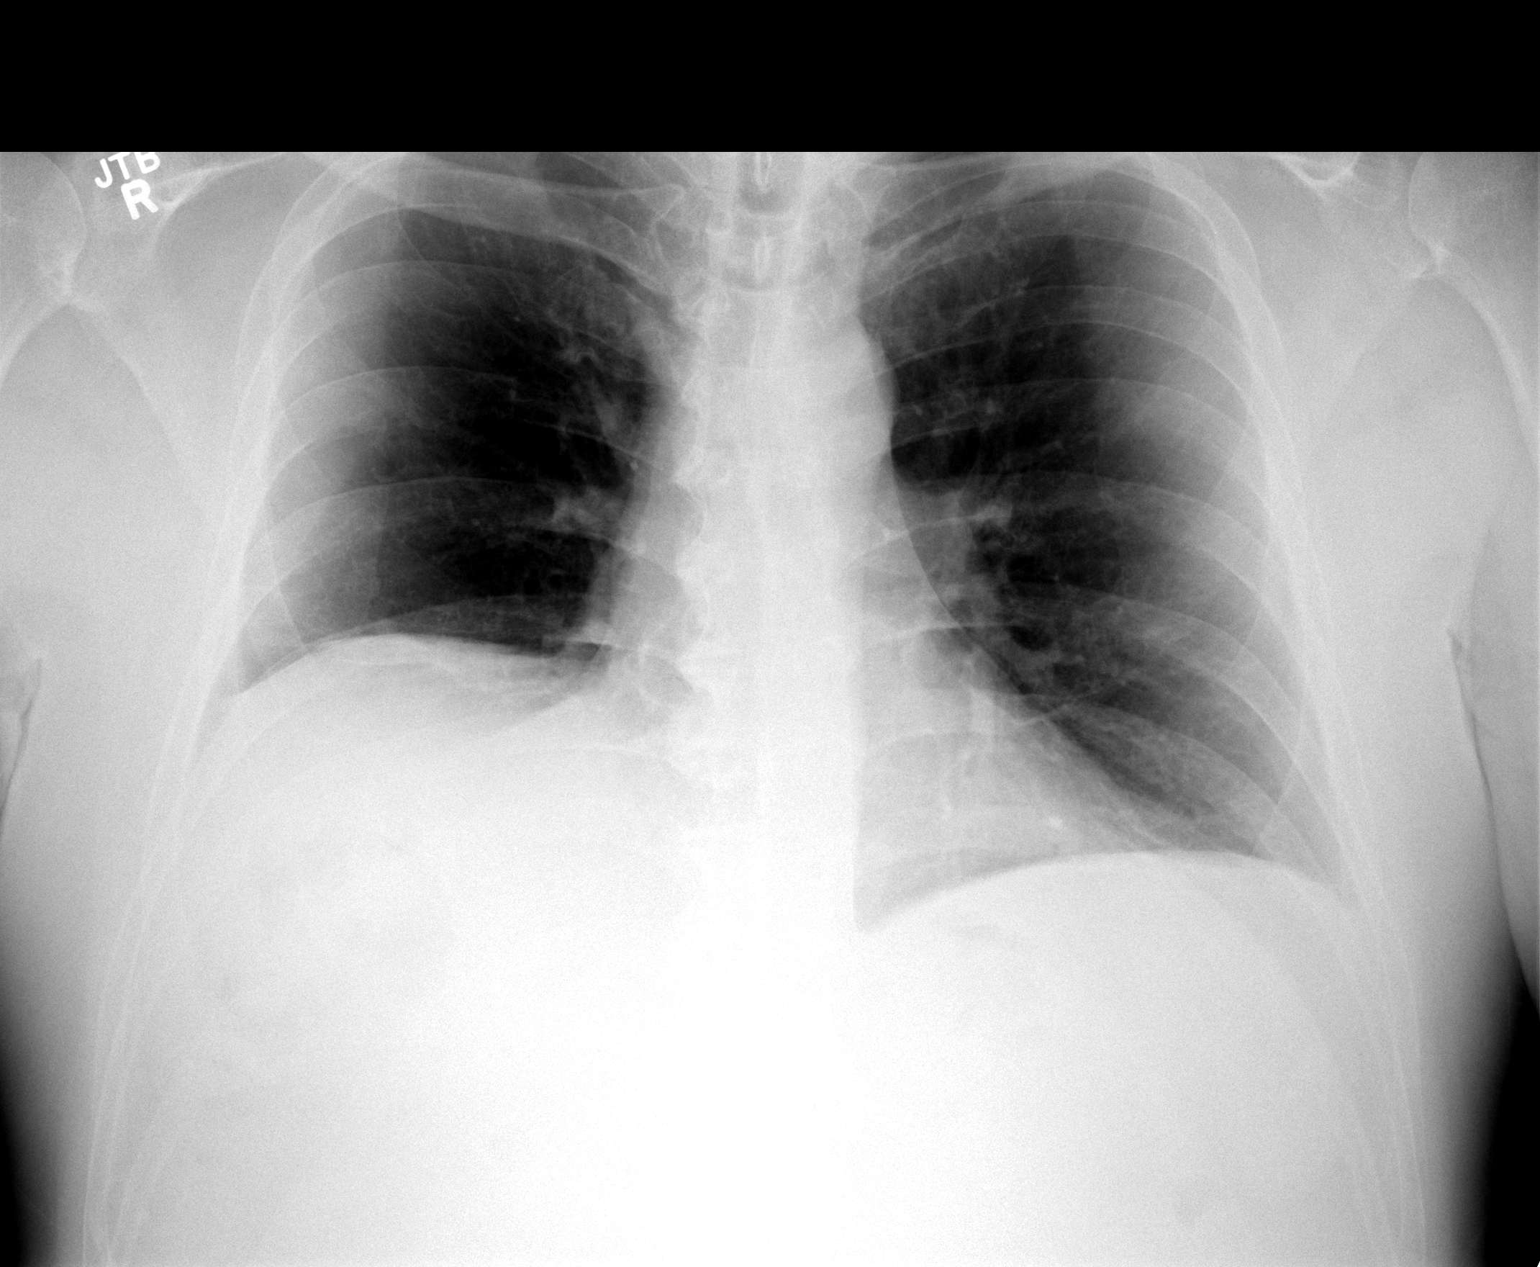

[1 of 1 positions shown; findings below may reference images not displayed]

FINDINGS: Cardiomediastinal silhouette is stable.  Elevation of the
right hemidiaphragm again noted.  No acute infiltrate or pulmonary
edema.  Bony thorax is stable.
IMPRESSION: No active disease.  Elevation of the right hemidiaphragm again
noted.

## 2015-04-15 IMAGING — CT CT ANGIO CHEST
2 of 9 series · 18 of 46 positions shown · IV contrast (APPLIED)
Comparison: No priors.

CLINICAL DATA: Shortness of breath and stabbing chest pain.

CT ANGIOGRAPHY CHEST
TECHNIQUE: Multidetector CT imaging of the chest using the
standard protocol during bolus administration of intravenous
contrast. Multiplanar reconstructed images including MIPs were
obtained and reviewed to evaluate the vascular anatomy.
Contrast: 100mL OMNIPAQUE IOHEXOL 350 MG/ML SOLN

[Series 5: thins · axial · 0.75mm/px · z∈[+1451,+1671]mm · 15 of 484 slices shown]
[im 22/484  lung]
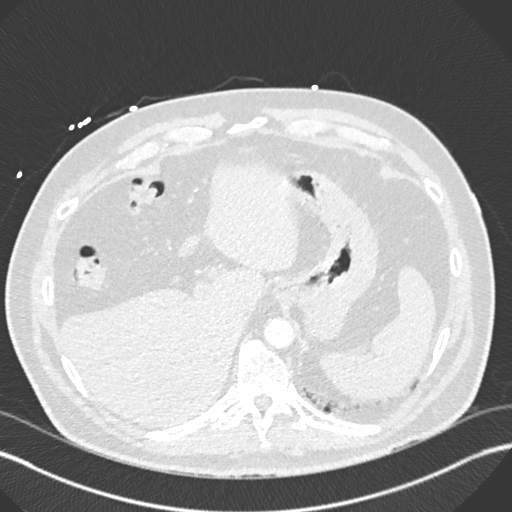
[im 66/484  soft-tissue]
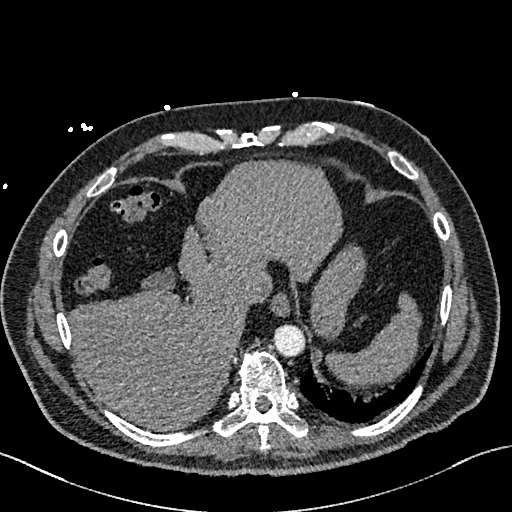
[im 88/484  lung]
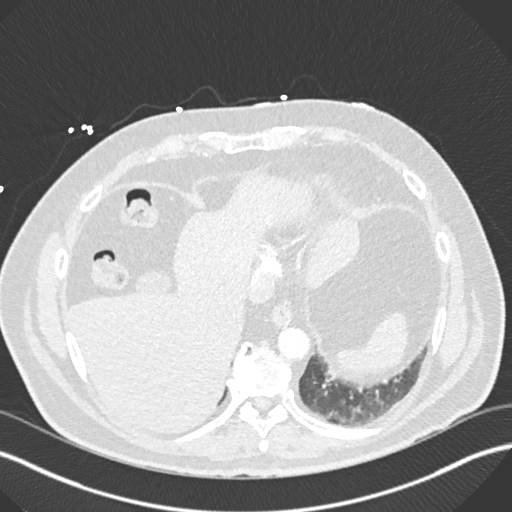
[im 110/484  soft-tissue]
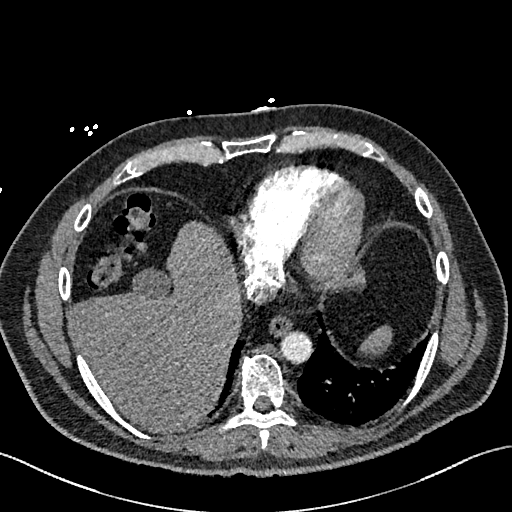
[im 154/484  lung]
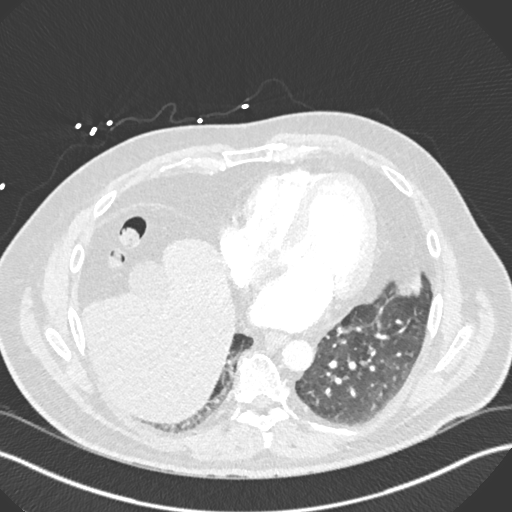
[im 176/484  soft-tissue]
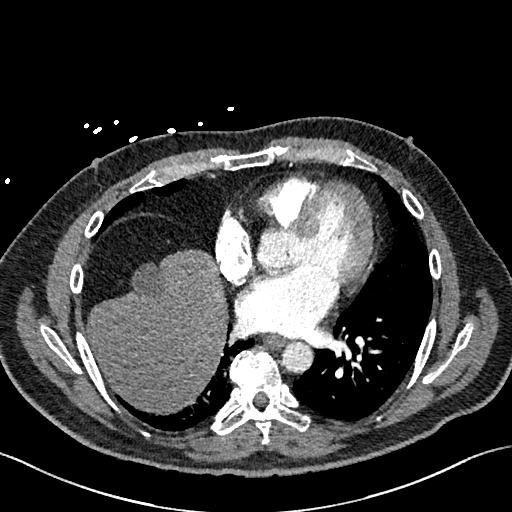
[im 220/484  lung]
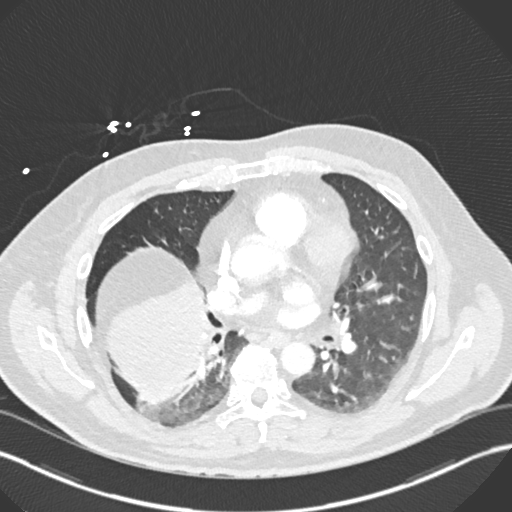
[im 242/484  soft-tissue]
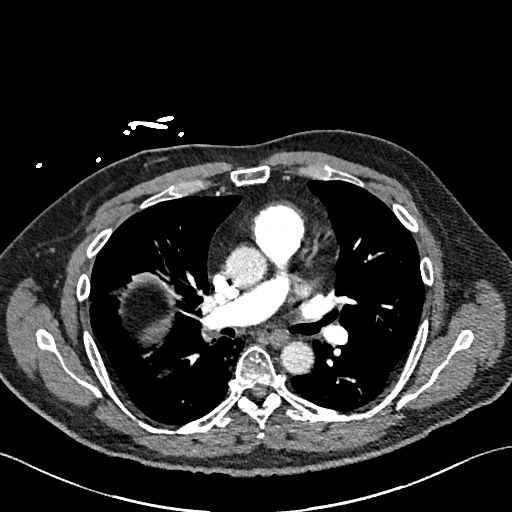
[im 264/484  lung]
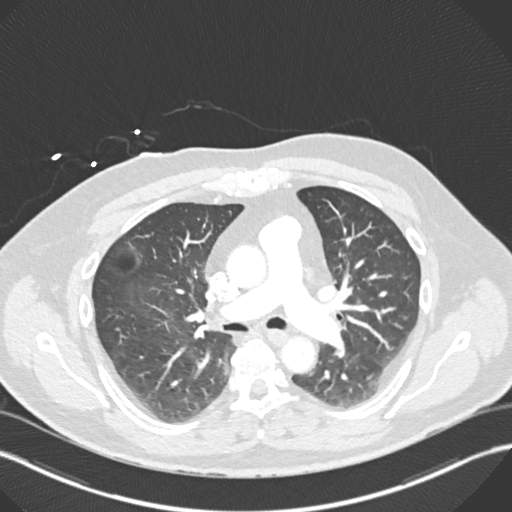
[im 308/484  soft-tissue]
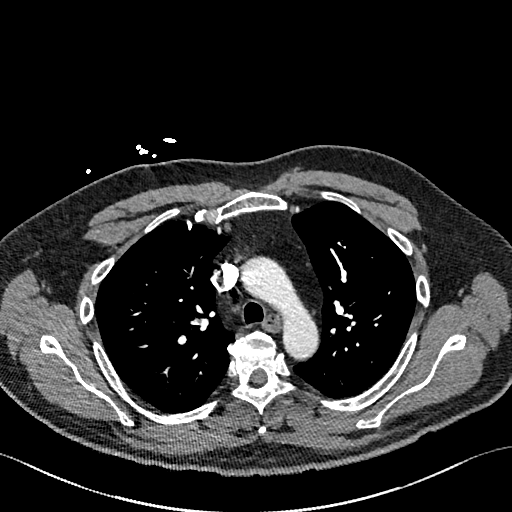
[im 330/484  lung]
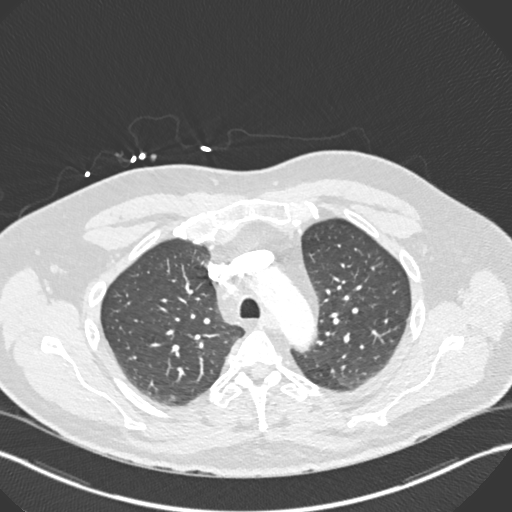
[im 374/484  soft-tissue]
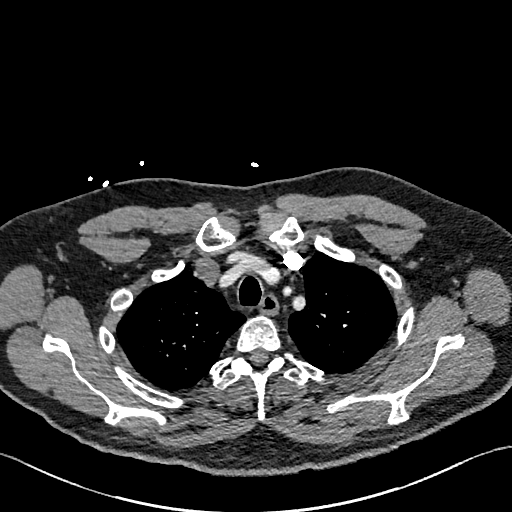
[im 396/484  lung]
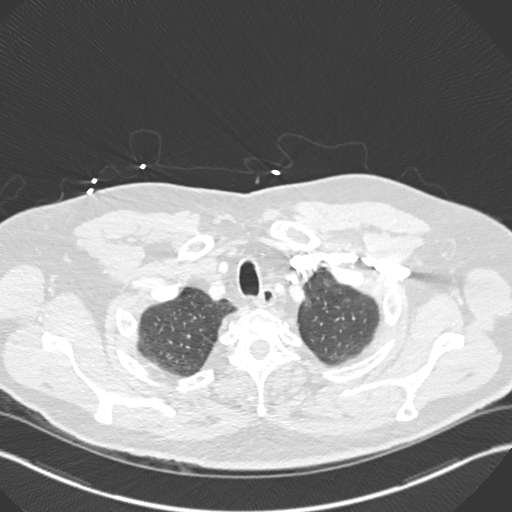
[im 418/484  soft-tissue]
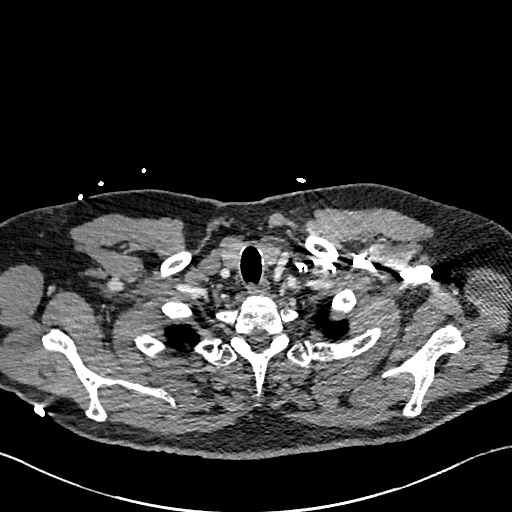
[im 462/484  lung]
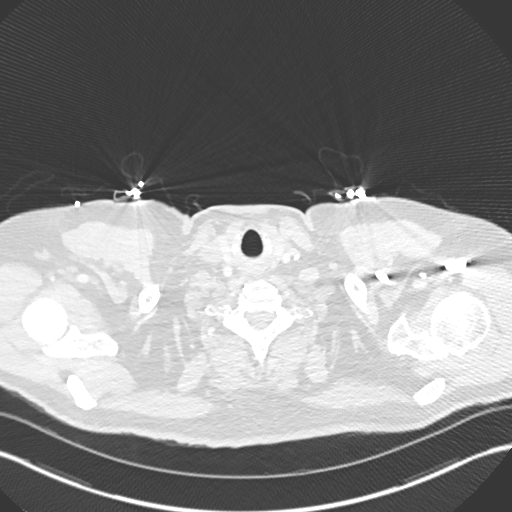

[Series 7: coronal mpr · coronal · 0.59mm/px · 3 of 114 slices shown]
[im 29/114  soft-tissue]
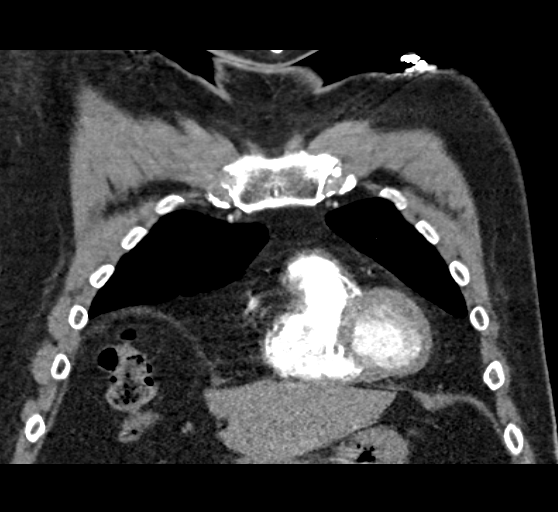
[im 57/114  soft-tissue]
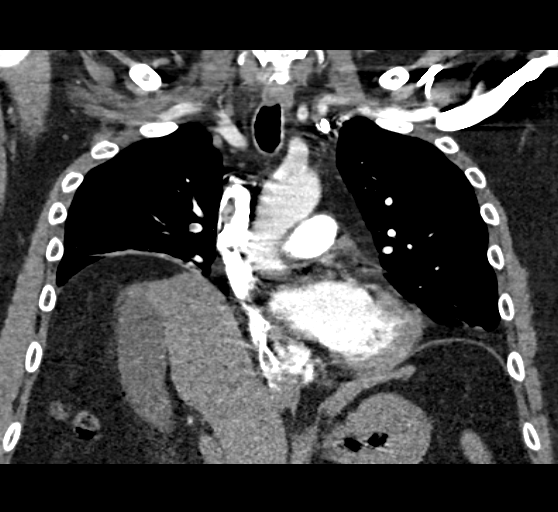
[im 85/114  soft-tissue]
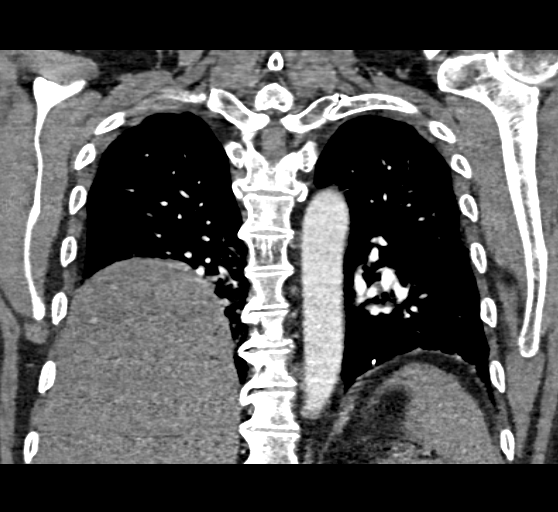

[18 of 46 positions shown; findings below may reference images not displayed]

FINDINGS: Mediastinum: There are multiple filling defects within the right
upper lobe pulmonary arterial tree involving the distal aspect of
the right upper lobe pulmonary artery, as well as segmental and
subsegmental sized branches.  These appear to be nonocclusive at
this time.  Some of these are centrally located within the vessel
lumen, while others are more eccentric suggesting some degree of
chronicity.  No larger central pulmonary embolus is otherwise
noted. Heart size is borderline enlarged. There is no significant
pericardial fluid, thickening or pericardial calcification. No
pathologically enlarged mediastinal or hilar lymph nodes. Esophagus
is unremarkable in appearance.

Lungs/Pleura: Elevation of the right hemidiaphragm with some
associated passive atelectasis in the right base.  There is a focal
area of air trapping involving the lateral segment of the right
middle lobe.  No acute consolidative airspace disease.  No pleural
effusions.  No definite suspicious appearing pulmonary nodules or
masses are identified at this time.

Upper Abdomen: Unremarkable.

Musculoskeletal: There are no aggressive appearing lytic or blastic
lesions noted in the visualized portions of the skeleton.
IMPRESSION: 1.  Study is positive for lobar, segmental and subsegmental sized
pulmonary embolism to the right upper lobe, likely with a
combination of acute and chronic thromboembolic disease.  All
filling defects appear to be nonocclusive, and there is no evidence
of frank pulmonary infarction at this time.
2.  Additional incidental findings, as above.

Critical Value/emergent results were called by telephone at the
time of interpretation on 06/15/2013 at [DATE] p.m. to Dr. Yrn, who
verbally acknowledged these results.

## 2015-05-23 ENCOUNTER — Observation Stay (HOSPITAL_COMMUNITY): Payer: Medicare Other

## 2015-05-23 ENCOUNTER — Emergency Department (HOSPITAL_COMMUNITY): Payer: Medicare Other

## 2015-05-23 ENCOUNTER — Observation Stay (HOSPITAL_COMMUNITY)
Admission: EM | Admit: 2015-05-23 | Discharge: 2015-05-24 | Disposition: A | Discharge status: Home / Self Care | Payer: Medicare Other | Attending: Internal Medicine | Admitting: Internal Medicine

## 2015-05-23 ENCOUNTER — Encounter (HOSPITAL_COMMUNITY): Payer: Self-pay | Admitting: Emergency Medicine

## 2015-05-23 DIAGNOSIS — I2510 Atherosclerotic heart disease of native coronary artery without angina pectoris: Secondary | ICD-10-CM | POA: Insufficient documentation

## 2015-05-23 DIAGNOSIS — H532 Diplopia: Secondary | ICD-10-CM | POA: Diagnosis not present

## 2015-05-23 DIAGNOSIS — G319 Degenerative disease of nervous system, unspecified: Secondary | ICD-10-CM | POA: Diagnosis not present

## 2015-05-23 DIAGNOSIS — R42 Dizziness and giddiness: Secondary | ICD-10-CM | POA: Insufficient documentation

## 2015-05-23 DIAGNOSIS — R0789 Other chest pain: Secondary | ICD-10-CM | POA: Insufficient documentation

## 2015-05-23 DIAGNOSIS — E785 Hyperlipidemia, unspecified: Secondary | ICD-10-CM | POA: Insufficient documentation

## 2015-05-23 DIAGNOSIS — Z85820 Personal history of malignant melanoma of skin: Secondary | ICD-10-CM | POA: Insufficient documentation

## 2015-05-23 DIAGNOSIS — R0602 Shortness of breath: Secondary | ICD-10-CM | POA: Diagnosis not present

## 2015-05-23 DIAGNOSIS — G9389 Other specified disorders of brain: Secondary | ICD-10-CM | POA: Insufficient documentation

## 2015-05-23 DIAGNOSIS — J9811 Atelectasis: Secondary | ICD-10-CM | POA: Diagnosis not present

## 2015-05-23 DIAGNOSIS — G3189 Other specified degenerative diseases of nervous system: Secondary | ICD-10-CM | POA: Insufficient documentation

## 2015-05-23 DIAGNOSIS — R918 Other nonspecific abnormal finding of lung field: Secondary | ICD-10-CM | POA: Insufficient documentation

## 2015-05-23 DIAGNOSIS — F17290 Nicotine dependence, other tobacco product, uncomplicated: Secondary | ICD-10-CM | POA: Diagnosis not present

## 2015-05-23 DIAGNOSIS — I739 Peripheral vascular disease, unspecified: Secondary | ICD-10-CM | POA: Diagnosis not present

## 2015-05-23 DIAGNOSIS — R29818 Other symptoms and signs involving the nervous system: Secondary | ICD-10-CM | POA: Diagnosis not present

## 2015-05-23 DIAGNOSIS — F17200 Nicotine dependence, unspecified, uncomplicated: Secondary | ICD-10-CM | POA: Diagnosis not present

## 2015-05-23 DIAGNOSIS — E042 Nontoxic multinodular goiter: Secondary | ICD-10-CM | POA: Insufficient documentation

## 2015-05-23 DIAGNOSIS — C9000 Multiple myeloma not having achieved remission: Secondary | ICD-10-CM | POA: Insufficient documentation

## 2015-05-23 DIAGNOSIS — H538 Other visual disturbances: Secondary | ICD-10-CM | POA: Diagnosis not present

## 2015-05-23 DIAGNOSIS — J189 Pneumonia, unspecified organism: Secondary | ICD-10-CM | POA: Insufficient documentation

## 2015-05-23 DIAGNOSIS — G459 Transient cerebral ischemic attack, unspecified: Secondary | ICD-10-CM | POA: Diagnosis not present

## 2015-05-23 DIAGNOSIS — H539 Unspecified visual disturbance: Secondary | ICD-10-CM

## 2015-05-23 DIAGNOSIS — E739 Lactose intolerance, unspecified: Secondary | ICD-10-CM | POA: Insufficient documentation

## 2015-05-23 DIAGNOSIS — Z86711 Personal history of pulmonary embolism: Secondary | ICD-10-CM | POA: Insufficient documentation

## 2015-05-23 DIAGNOSIS — G458 Other transient cerebral ischemic attacks and related syndromes: Secondary | ICD-10-CM | POA: Diagnosis not present

## 2015-05-23 DIAGNOSIS — G453 Amaurosis fugax: Secondary | ICD-10-CM

## 2015-05-23 HISTORY — DX: Multiple myeloma not having achieved remission: C90.00

## 2015-05-23 LAB — COMPREHENSIVE METABOLIC PANEL
ALK PHOS: 78 U/L (ref 38–126)
ALT: 24 U/L (ref 17–63)
AST: 25 U/L (ref 15–41)
Albumin: 3.9 g/dL (ref 3.5–5.0)
Anion gap: 10 (ref 5–15)
BUN: 14 mg/dL (ref 6–20)
CO2: 25 mmol/L (ref 22–32)
Calcium: 8.6 mg/dL — ABNORMAL LOW (ref 8.9–10.3)
Chloride: 106 mmol/L (ref 101–111)
Creatinine, Ser: 1.05 mg/dL (ref 0.61–1.24)
GFR calc Af Amer: >60 mL/min (ref >60)
Glucose, Bld: 96 mg/dL (ref 65–99)
Potassium: 4.2 mmol/L (ref 3.5–5.1)
SODIUM: 141 mmol/L (ref 135–145)
Total Bilirubin: 0.6 mg/dL (ref 0.3–1.2)
Total Protein: 6.6 g/dL (ref 6.5–8.1)

## 2015-05-23 LAB — TROPONIN I: Troponin I: <0.03 ng/mL (ref <0.031)

## 2015-05-23 LAB — PROTIME-INR
INR: 1.05 (ref 0.00–1.49)
Prothrombin Time: 13.9 seconds (ref 11.6–15.2)

## 2015-05-23 LAB — CBC
HCT: 45.3 % (ref 39.0–52.0)
Hemoglobin: 15.4 g/dL (ref 13.0–17.0)
MCH: 30.1 pg (ref 26.0–34.0)
MCHC: 34 g/dL (ref 30.0–36.0)
MCV: 88.6 fL (ref 78.0–100.0)
Platelets: 185 10*3/uL (ref 150–400)
RBC: 5.11 MIL/uL (ref 4.22–5.81)
RDW: 13.6 % (ref 11.5–15.5)
WBC: 8.3 10*3/uL (ref 4.0–10.5)

## 2015-05-23 LAB — DIFFERENTIAL
Basophils Absolute: 0.1 10*3/uL (ref 0.0–0.1)
Basophils Relative: 1 % (ref 0–1)
EOS PCT: 5 % (ref 0–5)
Eosinophils Absolute: 0.4 10*3/uL (ref 0.0–0.7)
LYMPHS ABS: 1.8 10*3/uL (ref 0.7–4.0)
Lymphocytes Relative: 21 % (ref 12–46)
MONOS PCT: 11 % (ref 3–12)
Monocytes Absolute: 0.9 10*3/uL (ref 0.1–1.0)
Neutro Abs: 5.1 10*3/uL (ref 1.7–7.7)
Neutrophils Relative %: 62 % (ref 43–77)

## 2015-05-23 LAB — APTT: aPTT: 29 seconds (ref 24–37)

## 2015-05-23 LAB — I-STAT TROPONIN, ED: TROPONIN I, POC: 0 ng/mL (ref 0.00–0.08)

## 2015-05-23 LAB — BRAIN NATRIURETIC PEPTIDE: B Natriuretic Peptide: 42.6 pg/mL (ref 0.0–100.0)

## 2015-05-23 LAB — CBG MONITORING, ED: Glucose-Capillary: 94 mg/dL (ref 65–99)

## 2015-05-23 MED ORDER — IOHEXOL 300 MG/ML  SOLN
80.0000 mL | Freq: Once | INTRAMUSCULAR | Status: AC | PRN
  Administered 2015-05-23: 80 mL via INTRAVENOUS

## 2015-05-23 MED ORDER — ASPIRIN 325 MG PO TABS
325.0000 mg | ORAL_TABLET | Freq: Every day | ORAL | Status: DC
Start: 2015-05-23 — End: 2015-05-24
  Administered 2015-05-23 – 2015-05-24 (×2): 325 mg via ORAL
  Filled 2015-05-23 (×2): qty 1

## 2015-05-23 MED ORDER — STROKE: EARLY STAGES OF RECOVERY BOOK
Freq: Once | Status: AC
Start: 2015-05-23 — End: 2015-05-23
  Administered 2015-05-23: 17:00:00

## 2015-05-23 MED ORDER — ASPIRIN 300 MG RE SUPP: 300.0000 mg | Freq: Every day | RECTAL | Status: DC

## 2015-05-23 MED ORDER — AZITHROMYCIN 250 MG PO TABS: 250.0000 mg | ORAL_TABLET | Freq: Every day | ORAL | Status: DC

## 2015-05-23 MED ORDER — AZITHROMYCIN 250 MG PO TABS
500.0000 mg | ORAL_TABLET | Freq: Every day | ORAL | Status: AC
  Administered 2015-05-23: 500 mg via ORAL
  Filled 2015-05-23: qty 2

## 2015-05-23 MED ORDER — SENNOSIDES-DOCUSATE SODIUM 8.6-50 MG PO TABS: 1.0000 | ORAL_TABLET | Freq: Every evening | ORAL | Status: DC | PRN

## 2015-05-23 MED ORDER — ENOXAPARIN SODIUM 40 MG/0.4ML ~~LOC~~ SOLN
40.0000 mg | SUBCUTANEOUS | Status: DC
  Administered 2015-05-23: 40 mg via SUBCUTANEOUS
  Filled 2015-05-23: qty 0.4

## 2015-05-23 NOTE — ED Provider Notes (Signed)
CSN: [id number]     Arrival date & time 05/23/15  1030 History   First MD Initiated Contact with Patient 05/23/15 1122     Chief Complaint  Patient presents with  . Diplopia  . Shortness of Breath     (Consider location/radiation/quality/duration/timing/severity/associated sxs/prior Treatment) HPI Comments: Pt had sudden onset blurry vision R eye & SOB while playing tennis this morning. Syptoms have since resolved after about 30 mins. Visual change described as painless blurriness. Denies CP, SON, cough, fever, chills, n/v, diaphoresis.   Patient is a 64 y.o. male presenting with neurologic complaint. The history is provided by the patient, a relative and the spouse. No language interpreter was used.  Neurologic Problem This is a new problem. The current episode started 1 to 2 hours ago. The problem occurs rarely. The problem has been resolved. Associated symptoms include shortness of breath. Pertinent negatives include no chest pain, no abdominal pain and no headaches. Associated symptoms comments: Sudden onset blurry vision R eye & SOB. Nothing aggravates the symptoms. Nothing relieves the symptoms. He has tried nothing for the symptoms. The treatment provided significant relief.    Past Medical History  Diagnosis Date  . Melanoma   . Skin cancer (melanoma)   . Pulmonary embolism 06/15/2013  . Multiple myeloma    Past Surgical History  Procedure Laterality Date  . Hand surgery    . Esophagogastroduodenoscopy  09/24/2012    Procedure: ESOPHAGOGASTRODUODENOSCOPY (EGD);  Surgeon: Lear Ng, MD;  Location: [facility] ENDOSCOPY;  Service: Endoscopy;  Laterality: N/A;  . Achilles tendon surgery Right 05/18/2013   Family History  Problem Relation Age of Onset  . Heart failure Father   . Cancer Neg Hx   . COPD Neg Hx   . Stroke Neg Hx   . Kidney disease Neg Hx   . Hypertension Neg Hx   . Hyperlipidemia Neg Hx   . Heart disease Neg Hx   . Hearing loss Neg Hx   . Early death Neg  Hx   . Drug abuse Neg Hx   . Diabetes Neg Hx   . Depression Neg Hx    History  Substance Use Topics  . Smoking status: Current Every Day Smoker    Types: Cigars  . Smokeless tobacco: Never Used  . Alcohol Use: 3.0 oz/week    5 Shots of liquor per week     Comment: occassional    Review of Systems  Constitutional: Negative for fever, activity change, appetite change and fatigue.  HENT: Negative for congestion, facial swelling, rhinorrhea and trouble swallowing.   Eyes: Positive for visual disturbance. Negative for photophobia and pain.  Respiratory: Positive for shortness of breath. Negative for cough and chest tightness.   Cardiovascular: Negative for chest pain and leg swelling.  Gastrointestinal: Negative for nausea, vomiting, abdominal pain, diarrhea and constipation.  Endocrine: Negative for polydipsia and polyuria.  Genitourinary: Negative for dysuria, urgency, decreased urine volume and difficulty urinating.  Musculoskeletal: Negative for back pain and gait problem.  Skin: Negative for color change, rash and wound.  Allergic/Immunologic: Negative for immunocompromised state.  Neurological: Negative for dizziness, facial asymmetry, speech difficulty, weakness, numbness and headaches.  Psychiatric/Behavioral: Negative for confusion, decreased concentration and agitation.      Allergies  Lactose intolerance (gi)  Home Medications   Prior to Admission medications   Medication Sig Start Date End Date Taking? Authorizing Provider  oxycodone (OXY-IR) 5 MG capsule Take 5 mg by mouth every 4 (four) hours as needed for pain.  Yes Historical Provider, MD   BP 112/63 mmHg  Pulse 63  Temp(Src) 97.5 F (36.4 C) (Oral)  Resp 18  Ht 6' (1.829 m)  Wt 210 lb (95.255 kg)  BMI 28.47 kg/m2  SpO2 96% Physical Exam  Constitutional: He is oriented to person, place, and time. He appears well-developed and well-nourished. No distress.  HENT:  Head: Normocephalic and atraumatic.   Mouth/Throat: No oropharyngeal exudate.  Eyes: Pupils are equal, round, and reactive to light.  Neck: Normal range of motion. Neck supple.  Cardiovascular: Normal rate, regular rhythm and normal heart sounds.  Exam reveals no gallop and no friction rub.   No murmur heard. Pulmonary/Chest: Effort normal and breath sounds normal. No respiratory distress. He has no wheezes. He has no rales.  Abdominal: Soft. Bowel sounds are normal. He exhibits no distension and no mass. There is no tenderness. There is no rebound and no guarding.  Musculoskeletal: Normal range of motion. He exhibits edema (1+ LLE, chronic). He exhibits no tenderness.  Neurological: He is alert and oriented to person, place, and time. He has normal strength. He displays no atrophy and no tremor. No cranial nerve deficit or sensory deficit. He exhibits normal muscle tone. Coordination normal. GCS eye subscore is 4. GCS verbal subscore is 5. GCS motor subscore is 6.  Skin: Skin is warm and dry.  Psychiatric: He has a normal mood and affect.    ED Course  Procedures (including critical care time) Labs Review Labs Reviewed  COMPREHENSIVE METABOLIC PANEL - Abnormal; Notable for the following:    Calcium 8.6 (*)    All other components within normal limits  HEMOGLOBIN A1C - Abnormal; Notable for the following:    Hgb A1c MFr Bld 5.8 (*)    All other components within normal limits  COMPREHENSIVE METABOLIC PANEL - Abnormal; Notable for the following:    Calcium 8.7 (*)    All other components within normal limits  LIPID PANEL - Abnormal; Notable for the following:    Triglycerides 207 (*)    HDL 32 (*)    VLDL 41 (*)    LDL Cholesterol 112 (*)    All other components within normal limits  PROTIME-INR  APTT  CBC  DIFFERENTIAL  BRAIN NATRIURETIC PEPTIDE  TROPONIN I  TROPONIN I  CBC  URINE RAPID DRUG SCREEN, HOSP PERFORMED  HEMOGLOBIN A1C  I-STAT TROPOININ, ED  CBG MONITORING, ED    Imaging Review Dg Chest 2  View  05/23/2015   CLINICAL DATA:  Shortness of breath.  EXAM: CHEST  2 VIEW  COMPARISON:  June 18, 2013  FINDINGS: There is subtle airspace opacity in the periphery of the left upper lobe. There is scarring in the lung bases which is stable. The heart size and pulmonary vascularity are normal. No adenopathy. No bone lesions.  IMPRESSION: Subtle area of opacity in the left upper lobe suspicious for pneumonia. Followup PA and lateral chest radiographs recommended in 3-4 weeks following trial of antibiotic therapy to ensure resolution and exclude underlying malignancy. Stable mild scarring in the bases. No change in cardiac silhouette.   Electronically Signed   By: Lowella Grip III M.D.   On: 05/23/2015 12:36   Ct Head (brain) Wo Contrast  05/23/2015   CLINICAL DATA:  Stroke symptoms with shortness of breath and diploplia. Symptoms now resolved.  EXAM: CT HEAD WITHOUT CONTRAST  TECHNIQUE: Contiguous axial images were obtained from the base of the skull through the vertex without intravenous contrast.  COMPARISON:  None.  FINDINGS: Skull and Sinuses:Negative for fracture or destructive process.  Mild inflammatory mucosal thickening in the posterior left maxillary antrum.  Orbits: No acute abnormality.  Brain: No evidence of acute infarction, hemorrhage, hydrocephalus, or mass lesion/mass effect. There is confluent low-density in the deep cerebral white matter consistent with chronic small vessel disease in this patient with history of smoking and hyperlipidemia. Mild for age generalized cerebral volume loss.  IMPRESSION: 1. No acute findings. 2. Mild chronic small vessel disease.   Electronically Signed   By: Monte Fantasia M.D.   On: 05/23/2015 12:40   Ct Chest W Contrast  05/23/2015   CLINICAL DATA:  Possible pneumonia on chest radiograph  EXAM: CT CHEST WITH CONTRAST  TECHNIQUE: Multidetector CT imaging of the chest was performed during intravenous contrast administration.  CONTRAST:  65m OMNIPAQUE  IOHEXOL 300 MG/ML  SOLN  COMPARISON:  Chest radiographs dated 05/23/2015  FINDINGS: Mediastinum/Nodes: The heart is normal in size. No pericardial effusion.  Mild coronary atherosclerosis in the LAD (series 2/image 30).  Mild atherosclerotic calcifications of the aortic arch.  Small mediastinal lymph nodes which do not meet pathologic CT size criteria.  Small bilateral thyroid nodules.  Lungs/Pleura: Visualized lungs are essentially clear. Specifically, no findings suggestive of pneumonia.  Eventration of the right hemidiaphragm with associated compressive atelectasis in the posterior right lung base.  No suspicious pulmonary nodules.  No pleural effusion or pneumothorax.  Upper abdomen: Visualized upper abdomen is notable for a 5.8 cm left renal cyst.  Musculoskeletal: Degenerative changes of the visualized thoracolumbar spine.  IMPRESSION: No evidence of acute cardiopulmonary disease.  Eventration of the right hemidiaphragm with associated mild right basilar atelectasis.   Electronically Signed   By: SJulian HyM.D.   On: 05/23/2015 18:21   Mr Brain Wo Contrast  05/23/2015   CLINICAL DATA:  64y.o. 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 Subsequently drove home and then came to the ER with his wife.He feels normal and back to baseline now.He denies any associated weakness, numbness, slurring of speech etc.He denies any changes in his medications, he denies excessive alcohol use did drink half a beer  last night.  EXAM: MRI HEAD WITHOUT CONTRAST  MRA HEAD WITHOUT CONTRAST  TECHNIQUE: Multiplanar, multiecho pulse sequences of the brain and surrounding structures were obtained without intravenous contrast. Angiographic images of the head were obtained using MRA technique without contrast.  COMPARISON:  CT head earlier today.  FINDINGS: MRI HEAD FINDINGS  No evidence for acute infarction, hemorrhage, mass lesion, hydrocephalus, or extra-axial fluid. Moderate cerebral and cerebellar atrophy. Moderate T2 and FLAIR hyperintensities throughout the periventricular and subcortical white matter representing small vessel disease most likely. Pineal and cerebellar tonsils unremarkable. No upper cervical lesions. Partial empty sella. Prominent perivascular spaces suggesting chronic hypertension. Tiny focus of chronic hemorrhage RIGHT parietal periventricular white matter, nonspecific, but could represent sequelae of hypertension also. Proximal flow voids are maintained. Extracranial soft tissues unremarkable.  MRA HEAD FINDINGS  Dolichoectatic but widely patent internal carotid arteries and basilar artery. Vertebrals codominant. No intracranial stenosis or aneurysm.  IMPRESSION: No acute intracranial findings.  Atrophy and small vessel disease, see discussion above.  Unremarkable MRA of the intracranial vasculature.   Electronically Signed   By: Staci Righter M.D.   On: 05/23/2015 18:18   Mr Mra Head/brain Wo Cm  05/23/2015   CLINICAL DATA:  64 y.o. male with PMH of PE 2 years ago following surgery on his Achilles tendon, history of malignant melanoma of scalp N bridge of nose status post resection.Was in his usual state of health this morning, he was apparently playing tennis when he noticed some blurring of vision in his right eye and was unable to track the tennis ball, this persisted for a little while and then suddenly everything felt a little blurry and he had some transient dizziness.After this sees he also  experienced vague twinge in the middle of his chest which radiated up his neck. Subsequently drove home and then came to the ER with his wife.He feels normal and back to baseline now.He denies any associated weakness, numbness, slurring of speech etc.He denies any changes in his medications, he denies excessive alcohol use did drink half a beer last night.  EXAM: MRI HEAD WITHOUT CONTRAST  MRA HEAD WITHOUT CONTRAST  TECHNIQUE: Multiplanar, multiecho pulse sequences of the brain and surrounding structures were obtained without intravenous contrast. Angiographic images of the head were obtained using MRA technique without contrast.  COMPARISON:  CT head earlier today.  FINDINGS: MRI HEAD FINDINGS  No evidence for acute infarction, hemorrhage, mass lesion, hydrocephalus, or extra-axial fluid. Moderate cerebral and cerebellar atrophy. Moderate T2 and FLAIR hyperintensities throughout the periventricular and subcortical white matter representing small vessel disease most likely. Pineal and cerebellar tonsils unremarkable. No upper cervical lesions. Partial empty sella. Prominent perivascular spaces suggesting chronic hypertension. Tiny focus of chronic hemorrhage RIGHT parietal periventricular white matter, nonspecific, but could represent sequelae of hypertension also. Proximal flow voids are maintained. Extracranial soft tissues unremarkable.  MRA HEAD FINDINGS  Dolichoectatic but widely patent internal carotid arteries and basilar artery. Vertebrals codominant. No intracranial stenosis or aneurysm.  IMPRESSION: No acute intracranial findings.  Atrophy and small vessel disease, see discussion above.  Unremarkable MRA of the intracranial vasculature.   Electronically Signed   By: Staci Righter M.D.   On: 05/23/2015 18:18     EKG Interpretation   Date/Time:  Tuesday May 23 2015 10:36:28 EDT Ventricular Rate:  76 PR Interval:  128 QRS Duration: 92 QT Interval:  368 QTC Calculation: 414 R Axis:   3 Text  Interpretation:  Normal sinus rhythm Cannot rule out Inferior infarct  , age undetermined Abnormal ECG No significant change since last tracing  Confirmed by DOCHERTY  MD, [place] [phone]) on 05/23/2015 11:24:14 AM      MDM   Final diagnoses:  Transient cerebral ischemia, unspecified transient cerebral ischemia type  Community acquired pneumonia    Pt is a 64 y.o. male with Pmhx as above who presents with an episode of sudden onset right-sided blurry vision and shortness of breath while playing tennis that lasted approximately 30 minutes. Neuro exam now nml. Hx of provoked PE, but denies CP, new leg pain/swelling, no recent immobilization, HR, O2 sats nml. CT head nml. CXR with LUL opacity c/w CAP. Spoke w/ neuro who rec TIA w/u for possible retinal TIA. [facility] will admit.     Ernestina Patches, MD 05/24/15 1036

## 2015-05-23 NOTE — Progress Notes (Signed)
Pt received at 16:10pm alert, verbal with no noted distress. Pt stable, neuro intact. Denies pain or discomfort. Able to move all extremities and follow commands. Pt oriented to room. Safety measures in place. Will continue to monitor. Report received prior to arriving to unit.

## 2015-05-23 NOTE — ED Notes (Signed)
Pt transported to xray and CT

## 2015-05-23 NOTE — H&P (Signed)
[facility] History and Physical  John Mccarthy UJW:[id number] DOB: 31-Mar-1951 DOA: 05/23/2015  Referring physician: EDP PCP: Scarlette Calico, MD   Chief Complaint: blurry vision R eye  HPI: John Mccarthy is a 64 y.o. 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 Was in his usual state of health this morning, he was apparently playing tennis when he noticed some blurring of vision in his right eye and was unable to track the tennis ball, this persisted for a little while and then suddenly everything felt a little blurry and he had some transient dizziness. After this sees he also experienced vague twinge in the middle of his chest which radiated up his neck. Subsequently drove home and then came to the ER with his wife. He feels normal and back to baseline now. He denies any associated weakness, numbness, slurring of speech etc. He denies any changes in his medications, he denies excessive alcohol use did drink half a beer last night.  Review of Systems: Positives bolded  Constitutional:  No weight loss, night sweats, Fevers, chills, fatigue.  HEENT:  No headaches, Difficulty swallowing,Tooth/dental problems,Sore throat,  No sneezing, itching, ear ache, nasal congestion, post nasal drip,  Cardio-vascular:  No chest pain, Orthopnea, PND, swelling in lower extremities, anasarca, dizziness, palpitations  GI:  No heartburn, indigestion, abdominal pain, nausea, vomiting, diarrhea, change in bowel habits, loss of appetite  Resp:  No shortness of breath with exertion or at rest. No excess mucus, no productive cough, No non-productive cough, No coughing up of blood.No change in color of mucus.No wheezing.No chest wall deformity  Skin:  no rash or lesions.  GU:  no dysuria, change in color of urine, no urgency or frequency. No flank pain.  Musculoskeletal:  No joint pain  or swelling. No decreased range of motion. No back pain.  Psych:  No change in mood or affect. No depression or anxiety. No memory loss.   Past Medical History  Diagnosis Date  . Melanoma   . Skin cancer (melanoma)   . Pulmonary embolism 06/15/2013  . Multiple myeloma    Past Surgical History  Procedure Laterality Date  . Hand surgery    . Esophagogastroduodenoscopy  09/24/2012    Procedure: ESOPHAGOGASTRODUODENOSCOPY (EGD);  Surgeon: Lear Ng, MD;  Location: [facility] ENDOSCOPY;  Service: Endoscopy;  Laterality: N/A;  . Achilles tendon surgery Right 05/18/2013   Social History:  reports that he has been smoking Cigars.  He has never used smokeless tobacco. He reports that he drinks about 3.0 oz of alcohol per week. He reports that he does not use illicit drugs.  Allergies  Allergen Reactions  . Lactose Intolerance (Gi) Diarrhea    Family History  Problem Relation Age of Onset  . Heart failure Father   . Cancer Neg Hx   . COPD Neg Hx   . Stroke Neg Hx   . Kidney disease Neg Hx   . Hypertension Neg Hx   . Hyperlipidemia Neg Hx   . Heart disease Neg Hx   . Hearing loss Neg Hx   . Early death Neg Hx   . Drug abuse Neg Hx   . Diabetes Neg Hx   . Depression Neg Hx     Prior to Admission medications   Medication Sig Start Date End Date Taking? Authorizing Provider  oxycodone (OXY-IR) 5 MG capsule Take 5 mg by mouth  every 4 (four) hours as needed for pain.   Yes Historical Provider, MD   Physical Exam: Filed Vitals:   05/23/15 1300 05/23/15 1345 05/23/15 1430 05/23/15 1522  BP: 130/70 133/74 123/77 123/77  Pulse: 81 65 66 64  Temp:      TempSrc:      Resp: 17 25 22 20   Height:      Weight:      SpO2: 97% 96% 97% 95%    Wt Readings from Last 3 Encounters:  05/23/15 95.255 kg (210 lb)  02/13/15 97.977 kg (216 lb)  10/18/13 96.616 kg (213 lb)    General:  AAOx 3, no distress Eyes: PERRL, normal lids, irises & conjunctiva ENT: grossly normal hearing, lips &  tongue Neck: no LAD, masses or thyromegaly  Cardiovascular: RRR, no m/r/g. No LE edema. Telemetry: SR, no arrhythmias  Respiratory: CTA bilaterally, no w/r/r. Normal respiratory effort. Abdomen: soft, ntnd Skin: no rash or induration seen on limited exam Musculoskeletal: grossly normal tone BUE/BLE Psychiatric: grossly normal mood and affect, speech fluent and appropriate Neurologic: grossly non-focal.          Labs on Admission:  Basic Metabolic Panel:  Recent Labs Lab 05/23/15 1141  NA 141  K 4.2  CL 106  CO2 25  GLUCOSE 96  BUN 14  CREATININE 1.05  CALCIUM 8.6*   Liver Function Tests:  Recent Labs Lab 05/23/15 1141  AST 25  ALT 24  ALKPHOS 78  BILITOT 0.6  PROT 6.6  ALBUMIN 3.9   No results for input(s): LIPASE, AMYLASE in the last 168 hours. No results for input(s): AMMONIA in the last 168 hours. CBC:  Recent Labs Lab 05/23/15 1141  WBC 8.3  NEUTROABS 5.1  HGB 15.4  HCT 45.3  MCV 88.6  PLT 185   Cardiac Enzymes: No results for input(s): CKTOTAL, CKMB, CKMBINDEX, TROPONINI in the last 168 hours.  BNP (last 3 results)  Recent Labs  05/23/15 1131  BNP 42.6    ProBNP (last 3 results) No results for input(s): PROBNP in the last 8760 hours.  CBG:  Recent Labs Lab 05/23/15 1103  GLUCAP 94    Radiological Exams on Admission: Dg Chest 2 View  05/23/2015   CLINICAL DATA:  Shortness of breath.  EXAM: CHEST  2 VIEW  COMPARISON:  June 18, 2013  FINDINGS: There is subtle airspace opacity in the periphery of the left upper lobe. There is scarring in the lung bases which is stable. The heart size and pulmonary vascularity are normal. No adenopathy. No bone lesions.  IMPRESSION: Subtle area of opacity in the left upper lobe suspicious for pneumonia. Followup PA and lateral chest radiographs recommended in 3-4 weeks following trial of antibiotic therapy to ensure resolution and exclude underlying malignancy. Stable mild scarring in the bases. No change  in cardiac silhouette.   Electronically Signed   By: Lowella Grip III M.D.   On: 05/23/2015 12:36   Ct Head (brain) Wo Contrast  05/23/2015   CLINICAL DATA:  Stroke symptoms with shortness of breath and diploplia. Symptoms now resolved.  EXAM: CT HEAD WITHOUT CONTRAST  TECHNIQUE: Contiguous axial images were obtained from the base of the skull through the vertex without intravenous contrast.  COMPARISON:  None.  FINDINGS: Skull and Sinuses:Negative for fracture or destructive process.  Mild inflammatory mucosal thickening in the posterior left maxillary antrum.  Orbits: No acute abnormality.  Brain: No evidence of acute infarction, hemorrhage, hydrocephalus, or mass lesion/mass effect. There is confluent  low-density in the deep cerebral white matter consistent with chronic small vessel disease in this patient with history of smoking and hyperlipidemia. Mild for age generalized cerebral volume loss.  IMPRESSION: 1. No acute findings. 2. Mild chronic small vessel disease.   Electronically Signed   By: Monte Fantasia M.D.   On: 05/23/2015 12:40    EKG: Independently reviewed. NSR, no acute ST T wave changes  Assessment/Plan  1. Transient painless blurring of vision right eye -Suspect TIA versus CRAO -Will admit, CVA workup -Check MRI/MRA 2-D echo, carotid duplex -Aspirin 325 mg -Neurology consulting - ophthalmology follow-up if above workup unremarkable -  2. Left upper lobe lung opacity -Suspicious for pneumonia on chest x-ray however no symptoms of same, will check CT chest  3. Transient chest pain/twinge -Resolved, EKG completely normal -Will check cardiac enzymes and check 2-D echocardiogram-  Code Status:Full Code DVT Prophylaxis:lovenox Family Communication: none at bedside Disposition Plan: inpatient  Time spent: 11mn  Yamir Carignan [facility] Pager [phone]

## 2015-05-23 NOTE — ED Notes (Addendum)
Pt sts while playing tennis today approx 30 min ago he had double vision in his right eye that is now almost completely resolved; pt sts some SOB that is improved and pain in left shoulder blade area; pt sts "funny feeling" in right side of neck; no obvious neuro deficits noted; EDP FH to see pt for eval

## 2015-05-23 NOTE — ED Notes (Signed)
Transporting patient to new room assignment. 

## 2015-05-23 NOTE — ED Notes (Signed)
Xray called to come get pt

## 2015-05-23 NOTE — ED Notes (Signed)
Phlebotomy at bedside.

## 2015-05-23 NOTE — Consult Note (Addendum)
Referring Physician: Dr Tawnya Crook    Chief Complaint: transient visual loss right eye  HPI:                                                                                                                                         John Mccarthy is an 64 y.o. male with a past medical history significant for melanoma, PE, comes in for evaluation of acute visual loss right eye. He is accompanied by his wife. Patient said that he never had similar symptom before. Today, he was playing tennis when suddenly notice some blurriness in the right eye that did not allow him to keep track of the tennis ball. It progressed to the point that " everything was very blurry" but he stresses the fact that he did not have complete visual loss. Further, he can not provide details about the pattern of impairment but emphasizes the fact that " it was not like a curtain coming down". No associated eye pain, HA, vertigo, double vision, focal weakness or numbness, slurred speech, confusion, unsteadiness, jaw claudication, difficulty chewing, or language impairment.  No neck or head trauma. He tells me that he stopped playing tennis, drove home, and when he arrived home his wife noted that " the right eye was red". Stated that he felt nauseated. The episode lasted for approximately 20 minutes and gradually resolved.  CT head was personally reviewed and showed no acute abnormality.  Date last known well: 05/23/15 Time last known well: unclear tPA Given: no, symptoms resolved   Past Medical History  Diagnosis Date  . Melanoma   . Skin cancer (melanoma)   . Pulmonary embolism 06/15/2013  . Multiple myeloma     Past Surgical History  Procedure Laterality Date  . Hand surgery    . Esophagogastroduodenoscopy  09/24/2012    Procedure: ESOPHAGOGASTRODUODENOSCOPY (EGD);  Surgeon: Lear Ng, MD;  Location: [facility] ENDOSCOPY;  Service: Endoscopy;  Laterality: N/A;  . Achilles tendon surgery Right 05/18/2013    Family  History  Problem Relation Age of Onset  . Heart failure Father   . Cancer Neg Hx   . COPD Neg Hx   . Stroke Neg Hx   . Kidney disease Neg Hx   . Hypertension Neg Hx   . Hyperlipidemia Neg Hx   . Heart disease Neg Hx   . Hearing loss Neg Hx   . Early death Neg Hx   . Drug abuse Neg Hx   . Diabetes Neg Hx   . Depression Neg Hx    Social History:  reports that he has been smoking Cigars.  He has never used smokeless tobacco. He reports that he drinks about 3.0 oz of alcohol per week. He reports that he does not use illicit drugs.  Allergies:  Allergies  Allergen Reactions  . Lactose Intolerance (Gi) Diarrhea    Medications:  I have reviewed the patient's current medications.  ROS:                                                                                                                                       History obtained from chart review and the patient, and wife.  General ROS: negative for - chills, fatigue, fever, night sweats, weight gain or weight loss Psychological ROS: negative for - behavioral disorder, hallucinations, memory difficulties, mood swings or suicidal ideation Ophthalmic ROS: negative for - double vision or eye pain ENT ROS: negative for - epistaxis, nasal discharge, oral lesions, sore throat, tinnitus or vertigo Allergy and Immunology ROS: negative for - hives or itchy/watery eyes Hematological and Lymphatic ROS: negative for - bleeding problems, bruising or swollen lymph nodes Endocrine ROS: negative for - galactorrhea, hair pattern changes, polydipsia/polyuria or temperature intolerance Respiratory ROS: negative for - cough, hemoptysis, shortness of breath or wheezing Cardiovascular ROS: negative for - chest pain, dyspnea on exertion, edema or irregular heartbeat Gastrointestinal ROS: negative for - abdominal pain, diarrhea,  hematemesis, vomiting or stool incontinence Genito-Urinary ROS: negative for - dysuria, hematuria, incontinence or urinary frequency/urgency Musculoskeletal ROS: negative for - joint swelling or muscular weakness Neurological ROS: as noted in HPI Dermatological ROS: negative for rash and skin lesion changes    Physical exam: pleasant male in no apparent distress. Blood pressure 130/70, pulse 81, temperature 98.8 F (37.1 C), temperature source Oral, resp. rate 17, height 6' (1.829 m), weight 95.255 kg (210 lb), SpO2 97 %. Head: normocephalic. Neck: supple, no bruits, no JVD. Cardiac: no murmurs. Lungs: clear. Abdomen: soft, no tender, no mass. Extremities: no edema. Skin: no rash Neurologic Examination:                                                                                                      General: Mental Status: Alert, oriented, thought content appropriate.  Speech fluent without evidence of aphasia.  Able to follow 3 step commands without difficulty. Cranial Nerves: II: Discs flat bilaterally; Visual fields grossly normal, pupils equal, round, reactive to light and accommodation III,IV, VI: ptosis not present, extra-ocular motions intact bilaterally V,VII: smile symmetric, facial light touch sensation normal bilaterally VIII: hearing normal bilaterally IX,X: uvula rises symmetrically XI: bilateral shoulder shrug XII: midline tongue extension without atrophy or fasciculations Motor: Right : Upper extremity   5/5    Left:     Upper extremity   5/5  Lower extremity   5/5  Lower extremity   5/5 Tone and bulk:normal tone throughout; no atrophy noted Sensory: Pinprick and light touch intact throughout, bilaterally Deep Tendon Reflexes:  Right: Upper Extremity   Left: Upper extremity   biceps (C-5 to C-6) 2/4   biceps (C-5 to C-6) 2/4 tricep (C7) 2/4    triceps (C7) 2/4 Brachioradialis (C6) 2/4  Brachioradialis (C6) 2/4  Lower Extremity Lower Extremity  quadriceps  (L-2 to L-4) 2/4   quadriceps (L-2 to L-4) 2/4 Achilles (S1) 2/4   Achilles (S1) 2/4  Plantars: Right: downgoing   Left: downgoing Cerebellar: normal finger-to-nose,  normal heel-to-shin test Gait:  No tested due to multiple leads     Results for orders placed or performed during the hospital encounter of 05/23/15 (from the past 48 hour(s))  CBG monitoring, ED     Status: None   Collection Time: 05/23/15 11:03 AM  Result Value Ref Range   Glucose-Capillary 94 65 - 99 mg/dL  Brain natriuretic peptide     Status: None   Collection Time: 05/23/15 11:31 AM  Result Value Ref Range   B Natriuretic Peptide 42.6 0.0 - 100.0 pg/mL  I-stat troponin, ED (not at [facility], [facility])     Status: None   Collection Time: 05/23/15 11:36 AM  Result Value Ref Range   Troponin i, poc 0.00 0.00 - 0.08 ng/mL   Comment 3            Comment: Due to the release kinetics of cTnI, a negative result within the first hours of the onset of symptoms does not rule out myocardial infarction with certainty. If myocardial infarction is still suspected, repeat the test at appropriate intervals.   Protime-INR     Status: None   Collection Time: 05/23/15 11:41 AM  Result Value Ref Range   Prothrombin Time 13.9 11.6 - 15.2 seconds   INR 1.05 0.00 - 1.49  APTT     Status: None   Collection Time: 05/23/15 11:41 AM  Result Value Ref Range   aPTT 29 24 - 37 seconds  CBC     Status: None   Collection Time: 05/23/15 11:41 AM  Result Value Ref Range   WBC 8.3 4.0 - 10.5 K/uL   RBC 5.11 4.22 - 5.81 MIL/uL   Hemoglobin 15.4 13.0 - 17.0 g/dL   HCT 45.3 39.0 - 52.0 %   MCV 88.6 78.0 - 100.0 fL   MCH 30.1 26.0 - 34.0 pg   MCHC 34.0 30.0 - 36.0 g/dL   RDW 13.6 11.5 - 15.5 %   Platelets 185 150 - 400 K/uL  Differential     Status: None   Collection Time: 05/23/15 11:41 AM  Result Value Ref Range   Neutrophils Relative % 62 43 - 77 %   Neutro Abs 5.1 1.7 - 7.7 K/uL   Lymphocytes Relative 21 12 - 46 %   Lymphs Abs 1.8  0.7 - 4.0 K/uL   Monocytes Relative 11 3 - 12 %   Monocytes Absolute 0.9 0.1 - 1.0 K/uL   Eosinophils Relative 5 0 - 5 %   Eosinophils Absolute 0.4 0.0 - 0.7 K/uL   Basophils Relative 1 0 - 1 %   Basophils Absolute 0.1 0.0 - 0.1 K/uL  Comprehensive metabolic panel     Status: Abnormal   Collection Time: 05/23/15 11:41 AM  Result Value Ref Range   Sodium 141 135 - 145 mmol/L   Potassium 4.2 3.5 - 5.1 mmol/L   Chloride 106 101 -  111 mmol/L   CO2 25 22 - 32 mmol/L   Glucose, Bld 96 65 - 99 mg/dL   BUN 14 6 - 20 mg/dL   Creatinine, Ser 1.05 0.61 - 1.24 mg/dL   Calcium 8.6 (L) 8.9 - 10.3 mg/dL   Total Protein 6.6 6.5 - 8.1 g/dL   Albumin 3.9 3.5 - 5.0 g/dL   AST 25 15 - 41 U/L   ALT 24 17 - 63 U/L   Alkaline Phosphatase 78 38 - 126 U/L   Total Bilirubin 0.6 0.3 - 1.2 mg/dL   GFR calc non Af Amer >60 >60 mL/min   GFR calc Af Amer >60 >60 mL/min    Comment: (NOTE) The eGFR has been calculated using the CKD EPI equation. This calculation has not been validated in all clinical situations. eGFR's persistently <60 mL/min signify possible Chronic Kidney Disease.    Anion gap 10 5 - 15   Dg Chest 2 View  05/23/2015   CLINICAL DATA:  Shortness of breath.  EXAM: CHEST  2 VIEW  COMPARISON:  June 18, 2013  FINDINGS: There is subtle airspace opacity in the periphery of the left upper lobe. There is scarring in the lung bases which is stable. The heart size and pulmonary vascularity are normal. No adenopathy. No bone lesions.  IMPRESSION: Subtle area of opacity in the left upper lobe suspicious for pneumonia. Followup PA and lateral chest radiographs recommended in 3-4 weeks following trial of antibiotic therapy to ensure resolution and exclude underlying malignancy. Stable mild scarring in the bases. No change in cardiac silhouette.   Electronically Signed   By: Lowella Grip III M.D.   On: 05/23/2015 12:36   Ct Head (brain) Wo Contrast  05/23/2015   CLINICAL DATA:  Stroke symptoms with  shortness of breath and diploplia. Symptoms now resolved.  EXAM: CT HEAD WITHOUT CONTRAST  TECHNIQUE: Contiguous axial images were obtained from the base of the skull through the vertex without intravenous contrast.  COMPARISON:  None.  FINDINGS: Skull and Sinuses:Negative for fracture or destructive process.  Mild inflammatory mucosal thickening in the posterior left maxillary antrum.  Orbits: No acute abnormality.  Brain: No evidence of acute infarction, hemorrhage, hydrocephalus, or mass lesion/mass effect. There is confluent low-density in the deep cerebral white matter consistent with chronic small vessel disease in this patient with history of smoking and hyperlipidemia. Mild for age generalized cerebral volume loss.  IMPRESSION: 1. No acute findings. 2. Mild chronic small vessel disease.   Electronically Signed   By: Monte Fantasia M.D.   On: 05/23/2015 12:40    Assessment: 64 y.o. male with transient painless visual impairment right eye that he describes as blurriness of vision for approximately 20 minutes while playing tennis earlier today. Neuroexam in unremarkable and CT brain showed no acute ab normality. It it worth noting that patient is adamant that he did not have visual loss in his right eye, thus it is hard to confidently say that he sustained a retinal or cortical TIA,  CRAO, or a transient event due to intrinsic eye pathology.  In any case, will recommend MRI/MRA brain, CUS, TTE. Aspirin 81 mg daily. Will follow up.  Stroke Risk Factors - none  Plan: 1. HgbA1c, fasting lipid panel 2. MRI, MRA  of the brain without contrast 3. Echocardiogram 4. Carotid dopplers 5. Prophylactic therapy-aspirin 6. Risk factor modification 7. Telemetry monitoring 8. Frequent neuro checks 9. PT/OT SLP (no needed at this time)

## 2015-05-24 ENCOUNTER — Encounter (HOSPITAL_COMMUNITY): Payer: Medicare Other

## 2015-05-24 ENCOUNTER — Observation Stay (HOSPITAL_BASED_OUTPATIENT_CLINIC_OR_DEPARTMENT_OTHER): Payer: Medicare Other

## 2015-05-24 DIAGNOSIS — R918 Other nonspecific abnormal finding of lung field: Secondary | ICD-10-CM | POA: Insufficient documentation

## 2015-05-24 DIAGNOSIS — G459 Transient cerebral ischemic attack, unspecified: Secondary | ICD-10-CM

## 2015-05-24 DIAGNOSIS — G458 Other transient cerebral ischemic attacks and related syndromes: Secondary | ICD-10-CM

## 2015-05-24 DIAGNOSIS — H539 Unspecified visual disturbance: Secondary | ICD-10-CM

## 2015-05-24 DIAGNOSIS — H538 Other visual disturbances: Secondary | ICD-10-CM | POA: Diagnosis not present

## 2015-05-24 LAB — HEMOGLOBIN A1C
Hgb A1c MFr Bld: 5.8 % — ABNORMAL HIGH (ref 4.8–5.6)
Mean Plasma Glucose: 120 mg/dL

## 2015-05-24 LAB — COMPREHENSIVE METABOLIC PANEL
ALT: 23 U/L (ref 17–63)
AST: 21 U/L (ref 15–41)
Albumin: 3.6 g/dL (ref 3.5–5.0)
Alkaline Phosphatase: 78 U/L (ref 38–126)
Anion gap: 8 (ref 5–15)
BUN: 11 mg/dL (ref 6–20)
CALCIUM: 8.7 mg/dL — AB (ref 8.9–10.3)
CO2: 25 mmol/L (ref 22–32)
Chloride: 107 mmol/L (ref 101–111)
Creatinine, Ser: 0.97 mg/dL (ref 0.61–1.24)
Glucose, Bld: 99 mg/dL (ref 65–99)
Potassium: 3.8 mmol/L (ref 3.5–5.1)
Sodium: 140 mmol/L (ref 135–145)
Total Bilirubin: 0.8 mg/dL (ref 0.3–1.2)
Total Protein: 6.6 g/dL (ref 6.5–8.1)

## 2015-05-24 LAB — LIPID PANEL
CHOL/HDL RATIO: 5.8 ratio
Cholesterol: 185 mg/dL (ref 0–200)
HDL: 32 mg/dL — ABNORMAL LOW (ref >40)
LDL Cholesterol: 112 mg/dL — ABNORMAL HIGH (ref 0–99)
Triglycerides: 207 mg/dL — ABNORMAL HIGH (ref <150)
VLDL: 41 mg/dL — ABNORMAL HIGH (ref 0–40)

## 2015-05-24 LAB — CBC
HEMATOCRIT: 44.9 % (ref 39.0–52.0)
HEMOGLOBIN: 15.3 g/dL (ref 13.0–17.0)
MCH: 30.1 pg (ref 26.0–34.0)
MCHC: 34.1 g/dL (ref 30.0–36.0)
MCV: 88.4 fL (ref 78.0–100.0)
Platelets: 194 10*3/uL (ref 150–400)
RBC: 5.08 MIL/uL (ref 4.22–5.81)
RDW: 13.6 % (ref 11.5–15.5)
WBC: 7.2 10*3/uL (ref 4.0–10.5)

## 2015-05-24 NOTE — Progress Notes (Signed)
SLP Cancellation Note  Patient Details Name: John Mccarthy MRN: [id number] DOB: Apr 25, 1951   Cancelled evaluation: No SLP eval warranted - will sign off.        Juan Quam Laurice 05/24/2015, 9:17 AM

## 2015-05-24 NOTE — Progress Notes (Addendum)
NEURO HOSPITALIST PROGRESS NOTE   SUBJECTIVE:                                                                                                                        Sitting in a couch at the bedside, reading the newspaper and saying that he feels great. MRI brain was personally reviewed and did not show acute abnormality or other lesion to explain patient symptoms. MRA brain also reviewed: dolichoectatic but widely patent internal carotid arteries and basilar artery. Vertebrals codominant. No intracranial stenosis or aneurysm. CUS and TTE pending. Lipid profile: total cholesterol 185, triglycerides 207, LDL 112, HDL 32 On aspirin.  OBJECTIVE:                                                                                                                           Vital signs in last 24 hours: Temp:  [97.5 F (36.4 C)-98.8 F (37.1 C)] 98.3 F (36.8 C) (06/22 1015) Pulse Rate:  [57-81] 71 (06/22 1015) Resp:  [13-25] 20 (06/22 1015) BP: (96-182)/(44-97) 127/65 mmHg (06/22 1015) SpO2:  [95 %-100 %] 97 % (06/22 1015) Weight:  [95.255 kg (210 lb)] 95.255 kg (210 lb) (06/21 1044)  Intake/Output from previous day:   Intake/Output this shift:   Nutritional status: Diet regular Room service appropriate?: Yes; Fluid consistency:: Thin  Past Medical History  Diagnosis Date  . Melanoma   . Skin cancer (melanoma)   . Pulmonary embolism 06/15/2013  . Multiple myeloma   Physical exam: pleasant male in no apparent distress. Head: normocephalic. Neck: supple, no bruits, no JVD. Cardiac: no murmurs. Lungs: clear. Abdomen: soft, no tender, no mass. Extremities: no edema. Skin: no rash  Neurologic Exam:  General: Mental Status: Alert, oriented, thought content appropriate. Speech fluent without evidence of aphasia. Able to follow 3 step commands without difficulty. Cranial Nerves: II: Discs flat bilaterally; Visual fields grossly normal, pupils  equal, round, reactive to light and accommodation III,IV, VI: ptosis not present, extra-ocular motions intact bilaterally V,VII: smile symmetric, facial light touch sensation normal bilaterally VIII: hearing normal bilaterally IX,X: uvula rises symmetrically XI: bilateral shoulder shrug XII: midline tongue extension without atrophy or fasciculations Motor: Right :Upper extremity 5/5Left: Upper extremity 5/5 Lower extremity 5/5Lower extremity 5/5  Tone and bulk:normal tone throughout; no atrophy noted Sensory: Pinprick and light touch intact throughout, bilaterally Deep Tendon Reflexes:  Right: Upper Extremity Left: Upper extremity   biceps (C-5 to C-6) 2/4 biceps (C-5 to C-6) 2/4 tricep (C7) 2/4triceps (C7) 2/4 Brachioradialis (C6) 2/4Brachioradialis (C6) 2/4  Lower Extremity Lower Extremity  quadriceps (L-2 to L-4) 2/4 quadriceps (L-2 to L-4) 2/4 Achilles (S1) 2/4Achilles (S1) 2/4  Plantars: Right: downgoingLeft: downgoing Cerebellar: normal finger-to-nose, normal heel-to-shin test Gait:  No tested due to multiple leads  Lab Results: Lab Results  Component Value Date/Time   CHOL 185 05/24/2015 07:00 AM   Lipid Panel  Recent Labs  05/24/15 0700  CHOL 185  TRIG 207*  HDL 32*  CHOLHDL 5.8  VLDL 41*  LDLCALC 112*    Studies/Results: Dg Chest 2 View  05/23/2015   CLINICAL DATA:  Shortness of breath.  EXAM: CHEST  2 VIEW  COMPARISON:  June 18, 2013  FINDINGS: There is subtle airspace opacity in the periphery of the left upper lobe. There is scarring in the lung bases which is stable. The heart size and pulmonary vascularity are normal. No adenopathy. No bone lesions.   IMPRESSION: Subtle area of opacity in the left upper lobe suspicious for pneumonia. Followup PA and lateral chest radiographs recommended in 3-4 weeks following trial of antibiotic therapy to ensure resolution and exclude underlying malignancy. Stable mild scarring in the bases. No change in cardiac silhouette.   Electronically Signed   By: Lowella Grip III M.D.   On: 05/23/2015 12:36   Ct Head (brain) Wo Contrast  05/23/2015   CLINICAL DATA:  Stroke symptoms with shortness of breath and diploplia. Symptoms now resolved.  EXAM: CT HEAD WITHOUT CONTRAST  TECHNIQUE: Contiguous axial images were obtained from the base of the skull through the vertex without intravenous contrast.  COMPARISON:  None.  FINDINGS: Skull and Sinuses:Negative for fracture or destructive process.  Mild inflammatory mucosal thickening in the posterior left maxillary antrum.  Orbits: No acute abnormality.  Brain: No evidence of acute infarction, hemorrhage, hydrocephalus, or mass lesion/mass effect. There is confluent low-density in the deep cerebral white matter consistent with chronic small vessel disease in this patient with history of smoking and hyperlipidemia. Mild for age generalized cerebral volume loss.  IMPRESSION: 1. No acute findings. 2. Mild chronic small vessel disease.   Electronically Signed   By: Monte Fantasia M.D.   On: 05/23/2015 12:40   Ct Chest W Contrast  05/23/2015   CLINICAL DATA:  Possible pneumonia on chest radiograph  EXAM: CT CHEST WITH CONTRAST  TECHNIQUE: Multidetector CT imaging of the chest was performed during intravenous contrast administration.  CONTRAST:  6m OMNIPAQUE IOHEXOL 300 MG/ML  SOLN  COMPARISON:  Chest radiographs dated 05/23/2015  FINDINGS: Mediastinum/Nodes: The heart is normal in size. No pericardial effusion.  Mild coronary atherosclerosis in the LAD (series 2/image 30).  Mild atherosclerotic calcifications of the aortic arch.  Small mediastinal lymph nodes which do not meet  pathologic CT size criteria.  Small bilateral thyroid nodules.  Lungs/Pleura: Visualized lungs are essentially clear. Specifically, no findings suggestive of pneumonia.  Eventration of the right hemidiaphragm with associated compressive atelectasis in the posterior right lung base.  No suspicious pulmonary nodules.  No pleural effusion or pneumothorax.  Upper abdomen: Visualized upper abdomen is notable for a 5.8 cm left renal cyst.  Musculoskeletal: Degenerative changes of the visualized thoracolumbar spine.  IMPRESSION: No evidence of acute cardiopulmonary disease.  Eventration of the right hemidiaphragm  with associated mild right basilar atelectasis.   Electronically Signed   By: Julian Hy M.D.   On: 05/23/2015 18:21   Mr Brain Wo Contrast  05/23/2015   CLINICAL DATA:  64 y.o. 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 Subsequently drove home and then came to the ER with his wife.He feels normal and back to baseline now.He denies any associated weakness, numbness, slurring of speech etc.He denies any changes in his medications, he denies excessive alcohol use did drink half a beer last night.  EXAM: MRI HEAD WITHOUT CONTRAST  MRA HEAD WITHOUT CONTRAST  TECHNIQUE: Multiplanar, multiecho pulse sequences of the brain and surrounding structures were obtained without intravenous contrast. Angiographic images of the head were obtained using MRA technique without contrast.  COMPARISON:  CT head earlier  today.  FINDINGS: MRI HEAD FINDINGS  No evidence for acute infarction, hemorrhage, mass lesion, hydrocephalus, or extra-axial fluid. Moderate cerebral and cerebellar atrophy. Moderate T2 and FLAIR hyperintensities throughout the periventricular and subcortical white matter representing small vessel disease most likely. Pineal and cerebellar tonsils unremarkable. No upper cervical lesions. Partial empty sella. Prominent perivascular spaces suggesting chronic hypertension. Tiny focus of chronic hemorrhage RIGHT parietal periventricular white matter, nonspecific, but could represent sequelae of hypertension also. Proximal flow voids are maintained. Extracranial soft tissues unremarkable.  MRA HEAD FINDINGS  Dolichoectatic but widely patent internal carotid arteries and basilar artery. Vertebrals codominant. No intracranial stenosis or aneurysm.  IMPRESSION: No acute intracranial findings.  Atrophy and small vessel disease, see discussion above.  Unremarkable MRA of the intracranial vasculature.   Electronically Signed   By: Staci Righter M.D.   On: 05/23/2015 18:18   Mr Mra Head/brain Wo Cm  05/23/2015   CLINICAL DATA:  64 y.o. 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 Subsequently drove home and then came to the ER with his wife.He feels normal and back to baseline now.He denies any associated weakness, numbness, slurring of speech etc.He denies any changes in his medications, he denies excessive alcohol use did  drink half a beer last night.  EXAM: MRI HEAD WITHOUT CONTRAST  MRA HEAD WITHOUT CONTRAST  TECHNIQUE: Multiplanar, multiecho pulse sequences of the brain and surrounding structures were obtained without intravenous contrast. Angiographic images of the head were obtained using MRA technique without contrast.  COMPARISON:  CT head earlier today.  FINDINGS: MRI HEAD FINDINGS  No evidence for acute infarction, hemorrhage, mass lesion, hydrocephalus, or extra-axial fluid. Moderate  cerebral and cerebellar atrophy. Moderate T2 and FLAIR hyperintensities throughout the periventricular and subcortical white matter representing small vessel disease most likely. Pineal and cerebellar tonsils unremarkable. No upper cervical lesions. Partial empty sella. Prominent perivascular spaces suggesting chronic hypertension. Tiny focus of chronic hemorrhage RIGHT parietal periventricular white matter, nonspecific, but could represent sequelae of hypertension also. Proximal flow voids are maintained. Extracranial soft tissues unremarkable.  MRA HEAD FINDINGS  Dolichoectatic but widely patent internal carotid arteries and basilar artery. Vertebrals codominant. No intracranial stenosis or aneurysm.  IMPRESSION: No acute intracranial findings.  Atrophy and small vessel disease, see discussion above.  Unremarkable MRA of the intracranial vasculature.   Electronically Signed   By: Staci Righter M.D.   On: 05/23/2015 18:18    MEDICATIONS                                                                                                                        Scheduled: . aspirin  300 mg Rectal Daily   Or  . aspirin  325 mg Oral Daily  . enoxaparin (LOVENOX) injection  40 mg Subcutaneous Q24H    ASSESSMENT/PLAN:                                                                                                           64 y.o. male with transient painless visual impairment right eye that he describes as blurriness of vision for  approximately 20 minutes while playing tennis earlier today. Neuroexam in unremarkable and CT brain showed no acute ab normality. It it worth noting that patient is adamant that he did not have visual loss in his right eye, thus it is hard to confidently say that he sustained a retinal or cortical TIA, CRAO, or a transient event due to intrinsic eye pathology.  MRI/MRA brain unimpressive Awaiting TTE, CUS. Continue aspirin, statins. Will follow up.   Dorian Pod, MD [facility] [phone]  05/24/2015, 10:31 AM    Addendum: CUS showed no evidence of hemodynamically significant carotid disease. TTE: EF: 55% -  60%, mildly dilated left atrium, no definite source of emboli. At this time, no further inpatient neurological intervention needed.  Needs outpatient evaluation by opthalmology. Continue low dose aspirin for now. Will sign off.  Dorian Pod, MD

## 2015-05-24 NOTE — Evaluation (Signed)
Physical Therapy Evaluation Patient Details Name: John Mccarthy MRN: [id number] DOB: 1951/05/05 Today's Date: 05/24/2015   History of Present Illness  64 y.o. male with transient painless visual impairment right eye that he describes as blurriness of vision for approximately 20 minutes. Neuro workup underway  Clinical Impression  Independent with all aspects of balance, mobility. Education regarding symptoms provided. No further acute PT needs, will sign off.    Follow Up Recommendations No PT follow up    Equipment Recommendations  None recommended by PT    Recommendations for Other Services       Precautions / Restrictions Restrictions Weight Bearing Restrictions: No      Mobility  Bed Mobility Overal bed mobility: Independent                Transfers Overall transfer level: Independent                  Ambulation/Gait Ambulation/Gait assistance: Independent Ambulation Distance (Feet): 420 Feet Assistive device: None Gait Pattern/deviations: WFL(Within Functional Limits)   Gait velocity interpretation: at or above normal speed for age/gender General Gait Details: WFL  Stairs Stairs: Yes Stairs assistance: Independent Stair Management: No rails Number of Stairs: 8    Wheelchair Mobility    Modified Rankin (Stroke Patients Only) Modified Rankin (Stroke Patients Only) Pre-Morbid Rankin Score: No symptoms Modified Rankin: No symptoms     Balance Overall balance assessment: No apparent balance deficits (not formally assessed)                                           Pertinent Vitals/Pain Pain Assessment: 0-10 Pain Score: 3  Pain Location: left leg Pain Descriptors / Indicators: Sore    Home Living Family/patient expects to be discharged to:: Private residence Living Arrangements: Spouse/significant other Available Help at Discharge: Family Type of Home: House Home Access: Stairs to enter Entrance  Stairs-Rails: Can reach both Entrance Stairs-Number of Steps: 3 Home Layout: Multi-level;Able to live on main level with bedroom/bathroom Home Equipment: Crutches      Prior Function Level of Independence: Independent               Hand Dominance   Dominant Hand: Right    Extremity/Trunk Assessment   Upper Extremity Assessment: Overall WFL for tasks assessed           Lower Extremity Assessment: Overall WFL for tasks assessed         Communication   Communication: No difficulties  Cognition Arousal/Alertness: Awake/alert Behavior During Therapy: WFL for tasks assessed/performed Overall Cognitive Status: Within Functional Limits for tasks assessed                      General Comments      Exercises        Assessment/Plan    PT Assessment Patent does not need any further PT services  PT Diagnosis Difficulty walking   PT Problem List    PT Treatment Interventions     PT Goals (Current goals can be found in the Care Plan section) Acute Rehab PT Goals PT Goal Formulation: All assessment and education complete, DC therapy    Frequency     Barriers to discharge        Co-evaluation               End of Session   Activity  Tolerance: Patient tolerated treatment well        Functional Assessment Tool Used: clinical judgement Functional Limitation: Mobility: Walking and moving around Mobility: Walking and Moving Around Current Status [phone]): 0 percent impaired, limited or restricted Mobility: Walking and Moving Around Goal Status [phone]): 0 percent impaired, limited or restricted Mobility: Walking and Moving Around Discharge Status [phone]): 0 percent impaired, limited or restricted    Time: 0808-0819 PT Time Calculation (min) (ACUTE ONLY): 11 min   Charges:   PT Evaluation $Initial PT Evaluation Tier I: 1 Procedure     PT G Codes:   PT G-Codes **NOT FOR INPATIENT CLASS** Functional Assessment Tool Used: clinical  judgement Functional Limitation: Mobility: Walking and moving around Mobility: Walking and Moving Around Current Status (H7290): 0 percent impaired, limited or restricted Mobility: Walking and Moving Around Goal Status (S1115): 0 percent impaired, limited or restricted Mobility: Walking and Moving Around Discharge Status [phone]): 0 percent impaired, limited or restricted    Duncan Dull 05/24/2015, 1:37 PM Alben Deeds, [place] DPT  [phone]

## 2015-05-24 NOTE — Discharge Summary (Addendum)
Physician Discharge Summary  John Mccarthy WOE:[id number] DOB: 02/14/1951 DOA: 05/23/2015  PCP: Scarlette Calico, MD  Admit date: 05/23/2015 Discharge date: 05/24/2015  Recommendations for Outpatient Follow-up:  1. Pt will need to follow up with PCP in 2 weeks post discharge   Discharge Diagnoses:  visual disturbance -Patient had transient blurred vision out of his right eye lasting approximately 20 minutes -Has not recurred -MRI brain negative for acute infarct -MRA brain--dolichoectatic but widely patent internal carotid arteries and basilar artery. Vertebrals codominant. No intracranial stenosis or Aneurysm. -Carotid duplex--negative for hemodynamically significant stenosis -Echocardiogram--EF is 55-60 percent, no WMA -05/23/2015 hemoglobin A1c 5.8-->follow up with PCP -LDL 112 -received ASA during the admission -Neurology was consulted to see the patient-And did not recommend any further neurologic testing after his stroke workup returned negative.  -Patient was advised to follow-up with ophthalmology in outpatient setting  Abnormal CXR -Chest x-ray suggested left upper lobe opacity -Patient without any clinical signs or symptoms of pneumonia -CT chest with contrast reveals no acute cardiopulmonary disease with right basilar atelectasis Atypical chest discomfort -EKG without any ischemic changes -Troponins negative 3 Discharge Condition: stable  Disposition: home  Diet:regular Wt Readings from Last 3 Encounters:  05/23/15 95.255 kg (210 lb)  02/13/15 97.977 kg (216 lb)  10/18/13 96.616 kg (213 lb)    History of present illness:  64 y.o. 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 Was in his usual state of health on the morning of admission;  he was apparently playing tennis when he noticed some blurring of vision in his right eye and was unable to track the  tennis ball, this persisted for a little while and then suddenly everything felt a little blurry and he had some transient dizziness. After this sees he also experienced vague twinge in the middle of his chest which radiated up his neck. The patient was admitted and neurology was consulted. The patient underwent a full neurologic stroke workup which was unremarkable. Cardiac enzymes were negative 3. The patient was instructed to follow-up with his ophthalmologist.    Consultants: neurology  Discharge Exam: Filed Vitals:   05/24/15 1349  BP: 101/58  Pulse: 68  Temp: 98.5 F (36.9 C)  Resp: 16   Filed Vitals:   05/24/15 0155 05/24/15 0418 05/24/15 1015 05/24/15 1349  BP: 99/54 112/63 127/65 101/58  Pulse: 57 63 71 68  Temp: 98.2 F (36.8 C) 97.5 F (36.4 C) 98.3 F (36.8 C) 98.5 F (36.9 C)  TempSrc: Oral Oral Oral Oral  Resp: 18 18 20 16   Height:      Weight:      SpO2: 96%  97% 96%   General: A&O x 3, NAD, pleasant, cooperative Cardiovascular: RRR, no rub, no gallop, no S3 Respiratory: CTAB, no wheeze, no rhonchi Abdomen:soft, nontender, nondistended, positive bowel sounds Extremities: No edema, No lymphangitis, no petechiae  Discharge Instructions      Discharge Instructions    Diet - low sodium heart healthy    Complete by:  As directed      Increase activity slowly    Complete by:  As directed             Medication List    TAKE these medications        oxycodone 5 MG capsule  Commonly known as:  OXY-IR  Take 5 mg by mouth every 4 (four) hours  as needed for pain.         The results of significant diagnostics from this hospitalization (including imaging, microbiology, ancillary and laboratory) are listed below for reference.    Significant Diagnostic Studies: Dg Chest 2 View  05/23/2015   CLINICAL DATA:  Shortness of breath.  EXAM: CHEST  2 VIEW  COMPARISON:  June 18, 2013  FINDINGS: There is subtle airspace opacity in the periphery of the left  upper lobe. There is scarring in the lung bases which is stable. The heart size and pulmonary vascularity are normal. No adenopathy. No bone lesions.  IMPRESSION: Subtle area of opacity in the left upper lobe suspicious for pneumonia. Followup PA and lateral chest radiographs recommended in 3-4 weeks following trial of antibiotic therapy to ensure resolution and exclude underlying malignancy. Stable mild scarring in the bases. No change in cardiac silhouette.   Electronically Signed   By: Lowella Grip III M.D.   On: 05/23/2015 12:36   Ct Head (brain) Wo Contrast  05/23/2015   CLINICAL DATA:  Stroke symptoms with shortness of breath and diploplia. Symptoms now resolved.  EXAM: CT HEAD WITHOUT CONTRAST  TECHNIQUE: Contiguous axial images were obtained from the base of the skull through the vertex without intravenous contrast.  COMPARISON:  None.  FINDINGS: Skull and Sinuses:Negative for fracture or destructive process.  Mild inflammatory mucosal thickening in the posterior left maxillary antrum.  Orbits: No acute abnormality.  Brain: No evidence of acute infarction, hemorrhage, hydrocephalus, or mass lesion/mass effect. There is confluent low-density in the deep cerebral white matter consistent with chronic small vessel disease in this patient with history of smoking and hyperlipidemia. Mild for age generalized cerebral volume loss.  IMPRESSION: 1. No acute findings. 2. Mild chronic small vessel disease.   Electronically Signed   By: Monte Fantasia M.D.   On: 05/23/2015 12:40   Ct Chest W Contrast  05/23/2015   CLINICAL DATA:  Possible pneumonia on chest radiograph  EXAM: CT CHEST WITH CONTRAST  TECHNIQUE: Multidetector CT imaging of the chest was performed during intravenous contrast administration.  CONTRAST:  43mL OMNIPAQUE IOHEXOL 300 MG/ML  SOLN  COMPARISON:  Chest radiographs dated 05/23/2015  FINDINGS: Mediastinum/Nodes: The heart is normal in size. No pericardial effusion.  Mild coronary  atherosclerosis in the LAD (series 2/image 30).  Mild atherosclerotic calcifications of the aortic arch.  Small mediastinal lymph nodes which do not meet pathologic CT size criteria.  Small bilateral thyroid nodules.  Lungs/Pleura: Visualized lungs are essentially clear. Specifically, no findings suggestive of pneumonia.  Eventration of the right hemidiaphragm with associated compressive atelectasis in the posterior right lung base.  No suspicious pulmonary nodules.  No pleural effusion or pneumothorax.  Upper abdomen: Visualized upper abdomen is notable for a 5.8 cm left renal cyst.  Musculoskeletal: Degenerative changes of the visualized thoracolumbar spine.  IMPRESSION: No evidence of acute cardiopulmonary disease.  Eventration of the right hemidiaphragm with associated mild right basilar atelectasis.   Electronically Signed   By: Julian Hy M.D.   On: 05/23/2015 18:21   Mr Brain Wo Contrast  05/23/2015   CLINICAL DATA:  64 y.o. 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 Subsequently drove home and then came to the ER with his wife.He feels normal and back to baseline now.He denies any associated weakness, numbness, slurring of speech etc.He denies any changes in his medications, he denies excessive alcohol use did drink half a beer last night.  EXAM: MRI HEAD WITHOUT CONTRAST  MRA HEAD WITHOUT CONTRAST  TECHNIQUE: Multiplanar, multiecho pulse sequences of the brain and surrounding structures were  obtained without intravenous contrast. Angiographic images of the head were obtained using MRA technique without contrast.  COMPARISON:  CT head earlier today.  FINDINGS: MRI HEAD FINDINGS  No evidence for acute infarction, hemorrhage, mass lesion, hydrocephalus, or extra-axial fluid. Moderate cerebral and cerebellar atrophy. Moderate T2 and FLAIR hyperintensities throughout the periventricular and subcortical white matter representing small vessel disease most likely. Pineal and cerebellar tonsils unremarkable. No upper cervical lesions. Partial empty sella. Prominent perivascular spaces suggesting chronic hypertension. Tiny focus of chronic hemorrhage RIGHT parietal periventricular white matter, nonspecific, but could represent sequelae of hypertension also. Proximal flow voids are maintained. Extracranial soft tissues unremarkable.  MRA HEAD FINDINGS  Dolichoectatic but widely patent internal carotid arteries and basilar artery. Vertebrals codominant. No intracranial stenosis or aneurysm.  IMPRESSION: No acute intracranial findings.  Atrophy and small vessel disease, see discussion above.  Unremarkable MRA of the intracranial vasculature.   Electronically Signed   By: Staci Righter M.D.   On: 05/23/2015 18:18   Mr Mra Head/brain Wo Cm  05/23/2015   CLINICAL DATA:  64 y.o. 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 Subsequently drove home and then came to the ER with his wife.He feels normal and back to  baseline now.He denies any associated weakness, numbness, slurring of speech etc.He denies any changes in his medications, he denies excessive alcohol use did drink half a beer last night.  EXAM: MRI HEAD WITHOUT CONTRAST  MRA HEAD WITHOUT CONTRAST  TECHNIQUE: Multiplanar, multiecho pulse sequences of the brain and surrounding structures were obtained without intravenous contrast. Angiographic images of the head were obtained using MRA technique without contrast.  COMPARISON:  CT head earlier today.  FINDINGS: MRI HEAD FINDINGS  No evidence for acute infarction, hemorrhage, mass lesion, hydrocephalus, or extra-axial fluid. Moderate cerebral and cerebellar atrophy. Moderate T2 and FLAIR hyperintensities throughout the periventricular and subcortical white matter representing small vessel disease most likely. Pineal and cerebellar tonsils unremarkable. No upper cervical lesions. Partial empty sella. Prominent perivascular spaces suggesting chronic hypertension. Tiny focus of chronic hemorrhage RIGHT parietal periventricular white matter, nonspecific, but could represent sequelae of hypertension also. Proximal flow voids are maintained. Extracranial soft tissues unremarkable.  MRA HEAD FINDINGS  Dolichoectatic but widely patent internal carotid arteries and basilar artery. Vertebrals codominant. No intracranial stenosis or aneurysm.  IMPRESSION: No acute intracranial findings.  Atrophy and small  vessel disease, see discussion above.  Unremarkable MRA of the intracranial vasculature.   Electronically Signed   By: Staci Righter M.D.   On: 05/23/2015 18:18     Microbiology: No results found for this or any previous visit (from the past 240 hour(s)).   Labs: Basic Metabolic Panel:  Recent Labs Lab 05/23/15 1141 05/24/15 0700  NA 141 140  K 4.2 3.8  CL 106 107  CO2 25 25  GLUCOSE 96 99  BUN 14 11  CREATININE 1.05 0.97  CALCIUM 8.6* 8.7*   Liver Function Tests:  Recent Labs Lab 05/23/15 1141  05/24/15 0700  AST 25 21  ALT 24 23  ALKPHOS 78 78  BILITOT 0.6 0.8  PROT 6.6 6.6  ALBUMIN 3.9 3.6   No results for input(s): LIPASE, AMYLASE in the last 168 hours. No results for input(s): AMMONIA in the last 168 hours. CBC:  Recent Labs Lab 05/23/15 1141 05/24/15 0700  WBC 8.3 7.2  NEUTROABS 5.1  --   HGB 15.4 15.3  HCT 45.3 44.9  MCV 88.6 88.4  PLT 185 194   Cardiac Enzymes:  Recent Labs Lab 05/23/15 1946 05/23/15 2215  TROPONINI <0.03 <0.03   BNP: Invalid input(s): POCBNP CBG:  Recent Labs Lab 05/23/15 1103  GLUCAP 94    Time coordinating discharge:  Greater than 30 minutes  Signed:  Altonio Schwertner, DO [facility] Pager: [phone] 05/24/2015, 2:29 PM

## 2015-05-24 NOTE — Progress Notes (Signed)
VASCULAR LAB PRELIMINARY  PRELIMINARY  PRELIMINARY  PRELIMINARY  Carotid duplex  completed.    Preliminary report:  Bilateral:  1-39% ICA stenosis.  Vertebral artery flow is antegrade.      Horris Speros, RVT 05/24/2015, 11:36 AM

## 2015-05-24 NOTE — Progress Notes (Signed)
  Echocardiogram 2D Echocardiogram has been performed.  Lysle Rubens 05/24/2015, 10:17 AM

## 2015-05-25 ENCOUNTER — Telehealth: Payer: Self-pay | Admitting: *Deleted

## 2015-05-25 DIAGNOSIS — H5310 Unspecified subjective visual disturbances: Secondary | ICD-10-CM | POA: Diagnosis not present

## 2015-05-25 DIAGNOSIS — H02054 Trichiasis without entropian left upper eyelid: Secondary | ICD-10-CM | POA: Diagnosis not present

## 2015-05-25 DIAGNOSIS — H2513 Age-related nuclear cataract, bilateral: Secondary | ICD-10-CM | POA: Diagnosis not present

## 2015-05-25 DIAGNOSIS — H524 Presbyopia: Secondary | ICD-10-CM | POA: Diagnosis not present

## 2015-05-25 LAB — HEMOGLOBIN A1C
HEMOGLOBIN A1C: 5.8 % — AB (ref 4.8–5.6)
MEAN PLASMA GLUCOSE: 120 mg/dL

## 2015-05-25 NOTE — Telephone Encounter (Signed)
Transition Care Management Follow-up Telephone Call D/C 05/24/15  How have you been since you were released from the hospital? Pt states he is alright   Do you understand why you were in the hospital? YES   Do you understand the discharge instrcutions? YES  Items Reviewed:  Medications reviewed: YES  Allergies reviewed: YES  Dietary changes reviewed: YES, low sodium  Referrals reviewed: No referral needed   Functional Questionnaire:   Activities of Daily Living (ADLs):   He states he are independent in the following: ambulation, bathing and hygiene, feeding, continence, grooming, toileting and dressing States he doesn't require assistance    Any transportation issues/concerns?: NO   Any patient concerns? NO   Confirmed importance and date/time of follow-up visits scheduled: YES, appt made 05/31/15 w/Dr. Ronnald Ramp  Confirmed with patient if condition begins to worsen call PCP or go to the ER.

## 2015-05-29 ENCOUNTER — Other Ambulatory Visit: Payer: Self-pay

## 2015-05-31 ENCOUNTER — Encounter: Payer: Self-pay | Admitting: Internal Medicine

## 2015-05-31 ENCOUNTER — Ambulatory Visit (INDEPENDENT_AMBULATORY_CARE_PROVIDER_SITE_OTHER): Payer: Medicare Other | Admitting: Internal Medicine

## 2015-05-31 ENCOUNTER — Other Ambulatory Visit (INDEPENDENT_AMBULATORY_CARE_PROVIDER_SITE_OTHER): Payer: Medicare Other

## 2015-05-31 VITALS — BP 124/28 | HR 69 | Temp 98.6°F | Resp 16 | Ht 73.0 in | Wt 213.5 lb

## 2015-05-31 DIAGNOSIS — M170 Bilateral primary osteoarthritis of knee: Secondary | ICD-10-CM | POA: Insufficient documentation

## 2015-05-31 DIAGNOSIS — E785 Hyperlipidemia, unspecified: Secondary | ICD-10-CM

## 2015-05-31 DIAGNOSIS — G453 Amaurosis fugax: Secondary | ICD-10-CM

## 2015-05-31 DIAGNOSIS — Z23 Encounter for immunization: Secondary | ICD-10-CM | POA: Diagnosis not present

## 2015-05-31 LAB — COMPREHENSIVE METABOLIC PANEL
ALBUMIN: 4 g/dL (ref 3.5–5.2)
ALT: 25 U/L (ref 0–53)
AST: 23 U/L (ref 0–37)
Alkaline Phosphatase: 79 U/L (ref 39–117)
BUN: 15 mg/dL (ref 6–23)
CALCIUM: 9 mg/dL (ref 8.4–10.5)
CHLORIDE: 105 meq/L (ref 96–112)
CO2: 27 mEq/L (ref 19–32)
Creatinine, Ser: 0.93 mg/dL (ref 0.40–1.50)
GFR: 86.39 mL/min (ref >60.00)
Glucose, Bld: 97 mg/dL (ref 70–99)
POTASSIUM: 3.9 meq/L (ref 3.5–5.1)
SODIUM: 139 meq/L (ref 135–145)
Total Bilirubin: 0.6 mg/dL (ref 0.2–1.2)
Total Protein: 6.9 g/dL (ref 6.0–8.3)

## 2015-05-31 LAB — PHOSPHORUS: Phosphorus: 3.4 mg/dL (ref 2.3–4.6)

## 2015-05-31 MED ORDER — ASPIRIN EC 81 MG PO TBEC: 81.0000 mg | DELAYED_RELEASE_TABLET | Freq: Every day | ORAL | Status: DC

## 2015-05-31 MED ORDER — OXYCODONE HCL 5 MG PO CAPS: 5.0000 mg | ORAL_CAPSULE | ORAL | Status: DC | PRN

## 2015-05-31 MED ORDER — ROSUVASTATIN CALCIUM 5 MG PO TABS: 5.0000 mg | ORAL_TABLET | Freq: Every day | ORAL | Status: DC

## 2015-05-31 NOTE — Progress Notes (Signed)
Pre visit review using our clinic review tool, if applicable. No additional management support is needed unless otherwise documented below in the visit note. 

## 2015-05-31 NOTE — Progress Notes (Signed)
Subjective:  Patient ID: John Mccarthy, male    DOB: 07/08/1951  Age: 64 y.o. MRN: [id number]  CC: Hyperlipidemia   HPI John Mccarthy presents for follow up from recent inpt admission for right eye vision loss that was attributed to a TIA - his work up was negative and he tells me that he has seen his ophth and the eye appears to be okay. He has felt well since discharge with no recurrent or new symptoms.  Outpatient Prescriptions Prior to Visit  Medication Sig Dispense Refill  . oxycodone (OXY-IR) 5 MG capsule Take 5 mg by mouth every 4 (four) hours as needed for pain.     No facility-administered medications prior to visit.    ROS Review of Systems  Constitutional: Negative.  Negative for fever, chills, diaphoresis, appetite change and fatigue.  HENT: Negative.   Eyes: Negative.  Negative for photophobia, discharge and visual disturbance.  Respiratory: Negative.  Negative for cough, choking, chest tightness, shortness of breath and stridor.   Cardiovascular: Negative.  Negative for chest pain and leg swelling.  Gastrointestinal: Negative.  Negative for nausea, vomiting, abdominal pain, diarrhea, constipation and blood in stool.  Endocrine: Negative.   Genitourinary: Negative.   Musculoskeletal: Positive for arthralgias (pain in both knees). Negative for myalgias, back pain, joint swelling and neck pain.  Skin: Negative.  Negative for rash.  Allergic/Immunologic: Negative.   Neurological: Negative.  Negative for dizziness, tremors, syncope, speech difficulty, light-headedness, numbness and headaches.  Hematological: Negative.  Negative for adenopathy. Does not bruise/bleed easily.  Psychiatric/Behavioral: Negative.     Objective:  BP 124/28 mmHg  Pulse 69  Temp(Src) 98.6 F (37 C) (Oral)  Resp 16  Ht 6\' 1"  (1.854 m)  Wt 213 lb 8 oz (96.843 kg)  BMI 28.17 kg/m2  SpO2 94%  BP Readings from Last 3 Encounters:  05/31/15 124/28  05/24/15 101/58  02/13/15  128/80    Wt Readings from Last 3 Encounters:  05/31/15 213 lb 8 oz (96.843 kg)  05/23/15 210 lb (95.255 kg)  02/13/15 216 lb (97.977 kg)    Physical Exam  Constitutional: He is oriented to person, place, and time. He appears well-developed and well-nourished. No distress.  HENT:  Head: Normocephalic and atraumatic.  Mouth/Throat: Oropharynx is clear and moist. No oropharyngeal exudate.  Eyes: Conjunctivae are normal. Right eye exhibits no discharge. Left eye exhibits no discharge. No scleral icterus.  Neck: Normal range of motion. Neck supple. No JVD present. No tracheal deviation present. No thyromegaly present.  Cardiovascular: Normal rate, regular rhythm, normal heart sounds and intact distal pulses.  Exam reveals no gallop and no friction rub.   No murmur heard. Pulmonary/Chest: Effort normal and breath sounds normal. No stridor. No respiratory distress. He has no wheezes. He has no rales. He exhibits no tenderness.  Abdominal: Soft. Bowel sounds are normal. He exhibits no distension and no mass. There is no tenderness. There is no rebound and no guarding.  Musculoskeletal: Normal range of motion. He exhibits no edema or tenderness.       Right knee: He exhibits deformity (DJD). He exhibits normal range of motion, no swelling, no effusion, no ecchymosis (DJD), no laceration, no erythema, normal alignment, no LCL laxity and normal patellar mobility. No tenderness found.       Left knee: He exhibits deformity (DJD). He exhibits normal range of motion, no swelling, no effusion, no ecchymosis, no laceration, no erythema, normal alignment, no LCL laxity, normal patellar mobility and no  bony tenderness. No tenderness found.  Lymphadenopathy:    He has no cervical adenopathy.  Neurological: He is oriented to person, place, and time.  Skin: Skin is warm and dry. No rash noted. He is not diaphoretic. No erythema. No pallor.  Vitals reviewed.   Lab Results  Component Value Date   WBC 7.2  05/24/2015   HGB 15.3 05/24/2015   HCT 44.9 05/24/2015   PLT 194 05/24/2015   GLUCOSE 97 05/31/2015   CHOL 185 05/24/2015   TRIG 207* 05/24/2015   HDL 32* 05/24/2015   LDLDIRECT 146.5 10/18/2013   LDLCALC 112* 05/24/2015   ALT 25 05/31/2015   AST 23 05/31/2015   NA 139 05/31/2015   K 3.9 05/31/2015   CL 105 05/31/2015   CREATININE 0.93 05/31/2015   BUN 15 05/31/2015   CO2 27 05/31/2015   TSH 2.93 06/18/2013   PSA 0.63 02/13/2015   INR 1.05 05/23/2015   HGBA1C 5.8* 05/24/2015    Dg Chest 2 View  05/23/2015   CLINICAL DATA:  Shortness of breath.  EXAM: CHEST  2 VIEW  COMPARISON:  June 18, 2013  FINDINGS: There is subtle airspace opacity in the periphery of the left upper lobe. There is scarring in the lung bases which is stable. The heart size and pulmonary vascularity are normal. No adenopathy. No bone lesions.  IMPRESSION: Subtle area of opacity in the left upper lobe suspicious for pneumonia. Followup PA and lateral chest radiographs recommended in 3-4 weeks following trial of antibiotic therapy to ensure resolution and exclude underlying malignancy. Stable mild scarring in the bases. No change in cardiac silhouette.   Electronically Signed   By: Lowella Grip III M.D.   On: 05/23/2015 12:36   Ct Head (brain) Wo Contrast  05/23/2015   CLINICAL DATA:  Stroke symptoms with shortness of breath and diploplia. Symptoms now resolved.  EXAM: CT HEAD WITHOUT CONTRAST  TECHNIQUE: Contiguous axial images were obtained from the base of the skull through the vertex without intravenous contrast.  COMPARISON:  None.  FINDINGS: Skull and Sinuses:Negative for fracture or destructive process.  Mild inflammatory mucosal thickening in the posterior left maxillary antrum.  Orbits: No acute abnormality.  Brain: No evidence of acute infarction, hemorrhage, hydrocephalus, or mass lesion/mass effect. There is confluent low-density in the deep cerebral white matter consistent with chronic small vessel  disease in this patient with history of smoking and hyperlipidemia. Mild for age generalized cerebral volume loss.  IMPRESSION: 1. No acute findings. 2. Mild chronic small vessel disease.   Electronically Signed   By: Monte Fantasia M.D.   On: 05/23/2015 12:40   Ct Chest W Contrast  05/23/2015   CLINICAL DATA:  Possible pneumonia on chest radiograph  EXAM: CT CHEST WITH CONTRAST  TECHNIQUE: Multidetector CT imaging of the chest was performed during intravenous contrast administration.  CONTRAST:  52mL OMNIPAQUE IOHEXOL 300 MG/ML  SOLN  COMPARISON:  Chest radiographs dated 05/23/2015  FINDINGS: Mediastinum/Nodes: The heart is normal in size. No pericardial effusion.  Mild coronary atherosclerosis in the LAD (series 2/image 30).  Mild atherosclerotic calcifications of the aortic arch.  Small mediastinal lymph nodes which do not meet pathologic CT size criteria.  Small bilateral thyroid nodules.  Lungs/Pleura: Visualized lungs are essentially clear. Specifically, no findings suggestive of pneumonia.  Eventration of the right hemidiaphragm with associated compressive atelectasis in the posterior right lung base.  No suspicious pulmonary nodules.  No pleural effusion or pneumothorax.  Upper abdomen: Visualized upper abdomen is  notable for a 5.8 cm left renal cyst.  Musculoskeletal: Degenerative changes of the visualized thoracolumbar spine.  IMPRESSION: No evidence of acute cardiopulmonary disease.  Eventration of the right hemidiaphragm with associated mild right basilar atelectasis.   Electronically Signed   By: Julian Hy M.D.   On: 05/23/2015 18:21   Mr Brain Wo Contrast  05/23/2015   CLINICAL DATA:  63 y.o. 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 Subsequently drove home and then came to the ER with his wife.He feels normal and back to baseline now.He denies any associated weakness, numbness, slurring of speech etc.He denies any changes in his medications, he denies excessive alcohol use did drink half a beer last night.  EXAM: MRI HEAD WITHOUT CONTRAST  MRA HEAD WITHOUT CONTRAST  TECHNIQUE: Multiplanar, multiecho pulse sequences of the brain and surrounding structures were obtained without intravenous contrast. Angiographic images of the head were obtained using MRA technique without contrast.  COMPARISON:  CT head earlier today.  FINDINGS: MRI HEAD FINDINGS  No evidence for acute infarction, hemorrhage, mass lesion, hydrocephalus, or extra-axial fluid. Moderate cerebral and cerebellar atrophy. Moderate T2 and FLAIR hyperintensities throughout the periventricular and subcortical white matter representing small vessel disease most likely. Pineal and cerebellar tonsils unremarkable. No upper cervical lesions. Partial empty sella. Prominent perivascular spaces suggesting chronic hypertension. Tiny focus of chronic hemorrhage RIGHT parietal periventricular white matter, nonspecific, but could represent sequelae of hypertension also. Proximal flow voids are maintained. Extracranial soft tissues unremarkable.  MRA HEAD FINDINGS  Dolichoectatic but widely patent internal carotid arteries and basilar artery. Vertebrals codominant. No intracranial stenosis or aneurysm.  IMPRESSION: No acute intracranial findings.  Atrophy and small vessel disease, see discussion above.  Unremarkable MRA of the intracranial vasculature.   Electronically Signed   By: Staci Righter M.D.   On: 05/23/2015 18:18   Mr Mra Head/brain Wo  Cm  05/23/2015   CLINICAL DATA:  64 y.o. 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 Subsequently drove home and then came to the ER with his wife.He feels normal and back to baseline now.He denies any associated weakness, numbness, slurring of speech etc.He denies any changes in his medications, he denies excessive alcohol use did drink half a beer last night.  EXAM: MRI HEAD WITHOUT CONTRAST  MRA HEAD WITHOUT CONTRAST  TECHNIQUE: Multiplanar, multiecho pulse sequences of the brain and surrounding structures were obtained without intravenous contrast. Angiographic images of the head were obtained  using MRA technique without contrast.  COMPARISON:  CT head earlier today.  FINDINGS: MRI HEAD FINDINGS  No evidence for acute infarction, hemorrhage, mass lesion, hydrocephalus, or extra-axial fluid. Moderate cerebral and cerebellar atrophy. Moderate T2 and FLAIR hyperintensities throughout the periventricular and subcortical white matter representing small vessel disease most likely. Pineal and cerebellar tonsils unremarkable. No upper cervical lesions. Partial empty sella. Prominent perivascular spaces suggesting chronic hypertension. Tiny focus of chronic hemorrhage RIGHT parietal periventricular white matter, nonspecific, but could represent sequelae of hypertension also. Proximal flow voids are maintained. Extracranial soft tissues unremarkable.  MRA HEAD FINDINGS  Dolichoectatic but widely patent internal carotid  arteries and basilar artery. Vertebrals codominant. No intracranial stenosis or aneurysm.  IMPRESSION: No acute intracranial findings.  Atrophy and small vessel disease, see discussion above.  Unremarkable MRA of the intracranial vasculature.   Electronically Signed   By: Staci Righter M.D.   On: 05/23/2015 18:18    Assessment & Plan:   John Mccarthy was seen today for hyperlipidemia.  Diagnoses and all orders for this visit:  Amaurosis fugax - will start risk factor modification, he was asked to quit smoking, will start low dose asa, will restart a statin Orders: -     rosuvastatin (CRESTOR) 5 MG tablet; Take 1 tablet (5 mg total) by mouth daily. -     aspirin EC 81 MG tablet; Take 1 tablet (81 mg total) by mouth daily.  Need for prophylactic vaccination against Streptococcus pneumoniae (pneumococcus) Orders: -     Pneumococcal conjugate vaccine 13-valent IM  Hyperlipidemia with target LDL less than 130 - start crestor, his new LDL goal is <70 Orders: -     rosuvastatin (CRESTOR) 5 MG tablet; Take 1 tablet (5 mg total) by mouth daily. -     Comprehensive metabolic panel; Future  Hypocalcemia - recheck today shows that this has resolved Orders: -     Comprehensive metabolic panel; Future -     Phosphorus; Future -     PTH, intact and calcium; Future  Primary osteoarthritis of both knees - will cont oxycodone as needed, he has a hx of PUD so will not take an nsaid Orders: -     oxycodone (OXY-IR) 5 MG capsule; Take 1 capsule (5 mg total) by mouth every 4 (four) hours as needed for pain.   I have changed John Mccarthy oxycodone. I am also having him start on rosuvastatin and aspirin EC.  Meds ordered this encounter  Medications  . rosuvastatin (CRESTOR) 5 MG tablet    Sig: Take 1 tablet (5 mg total) by mouth daily.    Dispense:  90 tablet    Refill:  3  . aspirin EC 81 MG tablet    Sig: Take 1 tablet (81 mg total) by mouth daily.    Dispense:  90 tablet    Refill:  3  .  oxycodone (OXY-IR) 5 MG capsule    Sig: Take 1 capsule (5 mg total) by mouth every 4 (four) hours as needed for pain.    Dispense:  60 capsule    Refill:  0     Follow-up: Return in about 4 months (around 09/30/2015).  Scarlette Calico, MD

## 2015-05-31 NOTE — Patient Instructions (Signed)

## 2015-06-01 LAB — PTH, INTACT AND CALCIUM
Calcium: 9 mg/dL (ref 8.4–10.5)
PTH: 38 pg/mL (ref 14–64)

## 2015-07-24 ENCOUNTER — Encounter: Payer: Self-pay | Admitting: Internal Medicine

## 2015-08-10 DIAGNOSIS — M79605 Pain in left leg: Secondary | ICD-10-CM | POA: Diagnosis not present

## 2015-08-10 DIAGNOSIS — M25562 Pain in left knee: Secondary | ICD-10-CM | POA: Diagnosis not present

## 2015-08-18 DIAGNOSIS — M25562 Pain in left knee: Secondary | ICD-10-CM | POA: Diagnosis not present

## 2015-08-24 DIAGNOSIS — D2271 Melanocytic nevi of right lower limb, including hip: Secondary | ICD-10-CM | POA: Diagnosis not present

## 2015-08-24 DIAGNOSIS — L821 Other seborrheic keratosis: Secondary | ICD-10-CM | POA: Diagnosis not present

## 2015-08-24 DIAGNOSIS — D225 Melanocytic nevi of trunk: Secondary | ICD-10-CM | POA: Diagnosis not present

## 2015-08-24 DIAGNOSIS — D2262 Melanocytic nevi of left upper limb, including shoulder: Secondary | ICD-10-CM | POA: Diagnosis not present

## 2015-08-24 DIAGNOSIS — Z85828 Personal history of other malignant neoplasm of skin: Secondary | ICD-10-CM | POA: Diagnosis not present

## 2015-08-24 DIAGNOSIS — L819 Disorder of pigmentation, unspecified: Secondary | ICD-10-CM | POA: Diagnosis not present

## 2015-08-24 DIAGNOSIS — D2261 Melanocytic nevi of right upper limb, including shoulder: Secondary | ICD-10-CM | POA: Diagnosis not present

## 2015-08-24 DIAGNOSIS — D2272 Melanocytic nevi of left lower limb, including hip: Secondary | ICD-10-CM | POA: Diagnosis not present

## 2015-08-24 DIAGNOSIS — Z85820 Personal history of malignant melanoma of skin: Secondary | ICD-10-CM | POA: Diagnosis not present

## 2015-08-29 DIAGNOSIS — M79605 Pain in left leg: Secondary | ICD-10-CM | POA: Diagnosis not present

## 2015-08-29 DIAGNOSIS — M23321 Other meniscus derangements, posterior horn of medial meniscus, right knee: Secondary | ICD-10-CM | POA: Diagnosis not present

## 2015-08-29 DIAGNOSIS — M25562 Pain in left knee: Secondary | ICD-10-CM | POA: Diagnosis not present

## 2016-02-15 DIAGNOSIS — M23321 Other meniscus derangements, posterior horn of medial meniscus, right knee: Secondary | ICD-10-CM | POA: Diagnosis not present

## 2016-02-28 DIAGNOSIS — M79605 Pain in left leg: Secondary | ICD-10-CM | POA: Diagnosis not present

## 2016-03-06 DIAGNOSIS — M79605 Pain in left leg: Secondary | ICD-10-CM | POA: Diagnosis not present

## 2016-03-13 DIAGNOSIS — M79605 Pain in left leg: Secondary | ICD-10-CM | POA: Diagnosis not present

## 2016-03-20 DIAGNOSIS — M79605 Pain in left leg: Secondary | ICD-10-CM | POA: Diagnosis not present

## 2016-03-27 DIAGNOSIS — M79605 Pain in left leg: Secondary | ICD-10-CM | POA: Diagnosis not present

## 2016-03-28 DIAGNOSIS — M79605 Pain in left leg: Secondary | ICD-10-CM | POA: Diagnosis not present

## 2016-05-22 DIAGNOSIS — H524 Presbyopia: Secondary | ICD-10-CM | POA: Diagnosis not present

## 2016-05-22 DIAGNOSIS — H5213 Myopia, bilateral: Secondary | ICD-10-CM | POA: Diagnosis not present

## 2016-05-22 DIAGNOSIS — H2513 Age-related nuclear cataract, bilateral: Secondary | ICD-10-CM | POA: Diagnosis not present

## 2016-05-22 DIAGNOSIS — H35363 Drusen (degenerative) of macula, bilateral: Secondary | ICD-10-CM | POA: Diagnosis not present

## 2016-07-16 NOTE — Progress Notes (Deleted)
Subjective:   John Mccarthy is a 65 y.o. male who presents for an Initial Medicare Annual Wellness Visit.  Here for AWV   Lifestyle; (family heart failure)  Hx skin cancer;  PE Multiple myeloma Melanoma  Tobacco current every day smoker; cigars ETOH   BMI  Diet   Exercise  Education provided;   SAFETY Generalized Safety in the home reviewed/  Removing clutter and tripping hazzards, railings on stairs and grab bars in the bathroom; good lighting; annual eye exam; review of medications  Falls assessed; mobility changes; balance changes; up at hs to the bathroom; medications;   Urinary or fecal incontinence reviewed Assess fear of falling ___  Educated on prevention falls; Exercise, toning and strengthening; Balance exercises  Wear Comfortable shoes;   Home Safety Tips for Older Adults given  Review Firearm safety as appropriate;  Smoke detectors; Assessed for community safety; Emergency Plan for illness or other Driving: seatbelt Sun protection  Lifeline: http://www.lifelinesys.com/content/home; [phone])802-3136 [phone]    Hearing   Vision  Overdue screens/ hep c     Objective:    There were no vitals filed for this visit. There is no height or weight on file to calculate BMI.  Current Medications (verified) Outpatient Encounter Prescriptions as of 07/17/2016  Medication Sig  . aspirin EC 81 MG tablet Take 1 tablet (81 mg total) by mouth daily.  Marland Kitchen oxycodone (OXY-IR) 5 MG capsule Take 1 capsule (5 mg total) by mouth every 4 (four) hours as needed for pain.  . rosuvastatin (CRESTOR) 5 MG tablet Take 1 tablet (5 mg total) by mouth daily.   No facility-administered encounter medications on file as of 07/17/2016.     Allergies (verified) Lactose intolerance (gi)   History: Past Medical History:  Diagnosis Date  . Melanoma   . Multiple myeloma   . Pulmonary embolism 06/15/2013  . Skin cancer (melanoma)    Past Surgical History:  Procedure  Laterality Date  . ACHILLES TENDON SURGERY Right 05/18/2013  . ESOPHAGOGASTRODUODENOSCOPY  09/24/2012   Procedure: ESOPHAGOGASTRODUODENOSCOPY (EGD);  Surgeon: Lear Ng, MD;  Location: [facility] ENDOSCOPY;  Service: Endoscopy;  Laterality: N/A;  . HAND SURGERY     Family History  Problem Relation Age of Onset  . Heart failure Father   . Cancer Neg Hx   . COPD Neg Hx   . Stroke Neg Hx   . Kidney disease Neg Hx   . Hypertension Neg Hx   . Hyperlipidemia Neg Hx   . Heart disease Neg Hx   . Hearing loss Neg Hx   . Early death Neg Hx   . Drug abuse Neg Hx   . Diabetes Neg Hx   . Depression Neg Hx    Social History   Occupational History  . Not on file.   Social History Main Topics  . Smoking status: Current Every Day Smoker    Types: Cigars  . Smokeless tobacco: Never Used  . Alcohol use 3.0 oz/week    5 Shots of liquor per week     Comment: occassional  . Drug use: No  . Sexual activity: Yes    Birth control/ protection: None   Tobacco Counseling Ready to quit: Not Answered Counseling given: Not Answered   Activities of Daily Living No flowsheet data found.  Immunizations and Health Maintenance Immunization History  Administered Date(s) Administered  . Influenza Split 09/25/2012  . Influenza,inj,Quad PF,36+ Mos 10/18/2013  . Pneumococcal Conjugate-13 05/31/2015  . Pneumococcal Polysaccharide-23 06/18/2013  .  Td 11/15/2008  . Zoster 07/16/2013   Health Maintenance Due  Topic Date Due  . Hepatitis C Screening  [birth year]  . INFLUENZA VACCINE  07/02/2016    Patient Care Team: Janith Lima, MD as PCP - General (Internal Medicine)  Indicate any recent Medical Services you may have received from other than [facility] providers in the past year (date may be approximate).    Assessment:   This is a routine wellness examination for John Mccarthy. ***  Hearing/Vision screen No exam data present  Dietary issues and exercise activities discussed:    Goals    None       Depression Screen PHQ 2/9 Scores 02/13/2015 10/18/2013  PHQ - 2 Score 0 0    Fall Risk Fall Risk  02/13/2015 10/18/2013  Falls in the past year? No No    Cognitive Function: No flowsheet data found.  Screening Tests Health Maintenance  Topic Date Due  . Hepatitis C Screening  [birth year]  . INFLUENZA VACCINE  07/02/2016  . PNA vac Low Risk Adult (2 of 2 - PPSV23) 06/18/2018  . TETANUS/TDAP  11/15/2018  . COLONOSCOPY  11/24/2018  . ZOSTAVAX  Completed        Plan:   ***  During the course of the visit John Mccarthy was educated and counseled about the following appropriate screening and preventive services:   Vaccines to include Pneumoccal, Influenza, Hepatitis B, Td, Zostavax, HCV  Electrocardiogram  Colorectal cancer screening  Cardiovascular disease screening  Diabetes screening  Glaucoma screening  Nutrition counseling  Prostate cancer screening  Smoking cessation counseling  Patient Instructions (the written plan) were given to the patient.   Wynetta Fines, RN   07/16/2016

## 2016-07-17 ENCOUNTER — Ambulatory Visit: Payer: Medicare Other

## 2016-08-02 ENCOUNTER — Other Ambulatory Visit: Payer: Self-pay

## 2016-10-15 ENCOUNTER — Ambulatory Visit (INDEPENDENT_AMBULATORY_CARE_PROVIDER_SITE_OTHER): Payer: Medicare Other | Admitting: Internal Medicine

## 2016-10-15 ENCOUNTER — Other Ambulatory Visit (INDEPENDENT_AMBULATORY_CARE_PROVIDER_SITE_OTHER): Payer: Medicare Other

## 2016-10-15 ENCOUNTER — Encounter: Payer: Self-pay | Admitting: Internal Medicine

## 2016-10-15 VITALS — BP 120/84 | HR 63 | Temp 98.2°F

## 2016-10-15 DIAGNOSIS — R7989 Other specified abnormal findings of blood chemistry: Secondary | ICD-10-CM | POA: Diagnosis not present

## 2016-10-15 DIAGNOSIS — R945 Abnormal results of liver function studies: Secondary | ICD-10-CM

## 2016-10-15 DIAGNOSIS — Z0000 Encounter for general adult medical examination without abnormal findings: Secondary | ICD-10-CM

## 2016-10-15 DIAGNOSIS — Z23 Encounter for immunization: Secondary | ICD-10-CM

## 2016-10-15 DIAGNOSIS — N400 Enlarged prostate without lower urinary tract symptoms: Secondary | ICD-10-CM

## 2016-10-15 DIAGNOSIS — E785 Hyperlipidemia, unspecified: Secondary | ICD-10-CM

## 2016-10-15 DIAGNOSIS — R7303 Prediabetes: Secondary | ICD-10-CM

## 2016-10-15 LAB — COMPREHENSIVE METABOLIC PANEL
ALT: 57 U/L — ABNORMAL HIGH (ref 0–53)
AST: 37 U/L (ref 0–37)
Albumin: 4.3 g/dL (ref 3.5–5.2)
Alkaline Phosphatase: 79 U/L (ref 39–117)
BILIRUBIN TOTAL: 0.7 mg/dL (ref 0.2–1.2)
BUN: 13 mg/dL (ref 6–23)
CHLORIDE: 103 meq/L (ref 96–112)
CO2: 26 meq/L (ref 19–32)
CREATININE: 0.98 mg/dL (ref 0.40–1.50)
Calcium: 9.4 mg/dL (ref 8.4–10.5)
GFR: 80.98 mL/min (ref >60.00)
GLUCOSE: 84 mg/dL (ref 70–99)
Potassium: 4 mEq/L (ref 3.5–5.1)
SODIUM: 140 meq/L (ref 135–145)
Total Protein: 7.2 g/dL (ref 6.0–8.3)

## 2016-10-15 LAB — CBC WITH DIFFERENTIAL/PLATELET
BASOS PCT: 0.4 % (ref 0.0–3.0)
Basophils Absolute: 0 10*3/uL (ref 0.0–0.1)
EOS PCT: 1.5 % (ref 0.0–5.0)
Eosinophils Absolute: 0.2 10*3/uL (ref 0.0–0.7)
HCT: 47.8 % (ref 39.0–52.0)
HEMOGLOBIN: 16.2 g/dL (ref 13.0–17.0)
LYMPHS PCT: 19.9 % (ref 12.0–46.0)
Lymphs Abs: 2.2 10*3/uL (ref 0.7–4.0)
MCHC: 33.9 g/dL (ref 30.0–36.0)
MCV: 89.4 fl (ref 78.0–100.0)
MONOS PCT: 5.9 % (ref 3.0–12.0)
Monocytes Absolute: 0.7 10*3/uL (ref 0.1–1.0)
NEUTROS ABS: 8.1 10*3/uL — AB (ref 1.4–7.7)
Neutrophils Relative %: 72.3 % (ref 43.0–77.0)
PLATELETS: 232 10*3/uL (ref 150.0–400.0)
RBC: 5.34 Mil/uL (ref 4.22–5.81)
RDW: 13.6 % (ref 11.5–15.5)
WBC: 11.2 10*3/uL — AB (ref 4.0–10.5)

## 2016-10-15 LAB — URINALYSIS, ROUTINE W REFLEX MICROSCOPIC
Bilirubin Urine: NEGATIVE
Hgb urine dipstick: NEGATIVE
Ketones, ur: NEGATIVE
LEUKOCYTES UA: NEGATIVE
NITRITE: NEGATIVE
PH: 5.5 (ref 5.0–8.0)
SPECIFIC GRAVITY, URINE: 1.01 (ref 1.000–1.030)
Total Protein, Urine: NEGATIVE
Urine Glucose: NEGATIVE
Urobilinogen, UA: 0.2 (ref 0.0–1.0)
WBC UA: NONE SEEN — AB (ref 0–?)

## 2016-10-15 LAB — LDL CHOLESTEROL, DIRECT: Direct LDL: 146 mg/dL

## 2016-10-15 LAB — PSA: PSA: 0.61 ng/mL (ref 0.10–4.00)

## 2016-10-15 LAB — HEMOGLOBIN A1C: Hgb A1c MFr Bld: 5.5 % (ref 4.6–6.5)

## 2016-10-15 LAB — LIPID PANEL
CHOL/HDL RATIO: 5
CHOLESTEROL: 210 mg/dL — AB (ref 0–200)
HDL: 41 mg/dL (ref >39.00)
NonHDL: 169.39
TRIGLYCERIDES: 219 mg/dL — AB (ref 0.0–149.0)
VLDL: 43.8 mg/dL — AB (ref 0.0–40.0)

## 2016-10-15 LAB — TSH: TSH: 3.06 u[IU]/mL (ref 0.35–4.50)

## 2016-10-15 NOTE — Patient Instructions (Signed)

## 2016-10-15 NOTE — Progress Notes (Signed)
Pre visit review using our clinic review tool, if applicable. No additional management support is needed unless otherwise documented below in the visit note. 

## 2016-10-15 NOTE — Progress Notes (Signed)
Subjective:  Patient ID: John Mccarthy, male    DOB: 1950/12/10  Age: 65 y.o. MRN: [id number]  CC: Annual Exam and Hyperlipidemia   HPI John Mccarthy presents for an AWV/CPX.  He has a history of hypercholesterolemia and mildly elevated Framingham risk score but he tried to take Crestor about a year ago and it caused muscle aches so he stopped taking it. He doesn't want to try another statin at this time. He feels well today and offers no complaints. He exercises and does not experience DOE, CP, shortness of breath, edema, palpitations, or fatigue.   Past Medical History:  Diagnosis Date  . Melanoma ([place])   . Multiple myeloma ([place])   . Pulmonary embolism ([place]) 06/15/2013  . Skin cancer (melanoma) [facility])    Past Surgical History:  Procedure Laterality Date  . ACHILLES TENDON SURGERY Right 05/18/2013  . ESOPHAGOGASTRODUODENOSCOPY  09/24/2012   Procedure: ESOPHAGOGASTRODUODENOSCOPY (EGD);  Surgeon: Lear Ng, MD;  Location: [facility] ENDOSCOPY;  Service: Endoscopy;  Laterality: N/A;  . HAND SURGERY      reports that he has never smoked. He has never used smokeless tobacco. He reports that he drinks about 3.0 oz of alcohol per week . He reports that he does not use drugs. family history includes Heart failure in his father. Allergies  Allergen Reactions  . Crestor [Rosuvastatin Calcium] Other (See Comments)    Muscle aches  . Lactose Intolerance (Gi) Diarrhea    Outpatient Medications Prior to Visit  Medication Sig Dispense Refill  . aspirin EC 81 MG tablet Take 1 tablet (81 mg total) by mouth daily. 90 tablet 3  . oxycodone (OXY-IR) 5 MG capsule Take 1 capsule (5 mg total) by mouth every 4 (four) hours as needed for pain. 60 capsule 0  . rosuvastatin (CRESTOR) 5 MG tablet Take 1 tablet (5 mg total) by mouth daily. (Patient not taking: Reported on 10/15/2016) 90 tablet 3   No facility-administered medications prior to visit.     ROS Review of Systems    Constitutional: Negative for activity change, diaphoresis, fatigue and unexpected weight change.  HENT: Negative.  Negative for sinus pressure and trouble swallowing.   Eyes: Negative.  Negative for visual disturbance.  Respiratory: Negative for cough, choking, chest tightness, shortness of breath and stridor.   Cardiovascular: Negative for chest pain, palpitations and leg swelling.  Gastrointestinal: Negative.  Negative for abdominal pain, constipation, diarrhea, nausea and vomiting.  Endocrine: Negative.   Genitourinary: Negative.  Negative for difficulty urinating, penile pain, penile swelling, scrotal swelling, testicular pain and urgency.  Musculoskeletal: Negative.  Negative for arthralgias, back pain, myalgias and neck pain.  Skin: Negative.  Negative for color change and rash.  Allergic/Immunologic: Negative.   Neurological: Negative.   Hematological: Negative.  Negative for adenopathy. Does not bruise/bleed easily.  Psychiatric/Behavioral: Negative.  Negative for decreased concentration. The patient is not nervous/anxious.     Objective:  BP 120/84 (BP Location: Left Arm, Patient Position: Sitting, Cuff Size: Normal)   Pulse 63   Temp 98.2 F (36.8 C) (Oral)   SpO2 94%   BP Readings from Last 3 Encounters:  10/15/16 120/84  05/31/15 (!) 124/28  05/24/15 (!) 101/58    Wt Readings from Last 3 Encounters:  05/31/15 213 lb 8 oz (96.8 kg)  05/23/15 210 lb (95.3 kg)  02/13/15 216 lb (98 kg)    Physical Exam  Constitutional: No distress.  HENT:  Mouth/Throat: Oropharynx is clear and moist. No oropharyngeal exudate.  Eyes: Conjunctivae are normal. Right eye exhibits no discharge. Left eye exhibits no discharge. No scleral icterus.  Neck: Normal range of motion. Neck supple. No JVD present. No tracheal deviation present. No thyromegaly present.  Cardiovascular: Normal rate, regular rhythm, normal heart sounds and intact distal pulses.  Exam reveals no gallop and no friction  rub.   No murmur heard. Pulmonary/Chest: Effort normal and breath sounds normal. No stridor. No respiratory distress. He has no wheezes. He has no rales. He exhibits no tenderness.  Abdominal: Soft. Bowel sounds are normal. He exhibits no distension and no mass. There is no tenderness. There is no rebound and no guarding. Hernia confirmed negative in the right inguinal area and confirmed negative in the left inguinal area.  Genitourinary: Rectum normal, testes normal and penis normal. Rectal exam shows no external hemorrhoid, no internal hemorrhoid, no fissure, no mass, no tenderness, anal tone normal and guaiac negative stool. Prostate is enlarged (1+ smooth symm BPH). Prostate is not tender. Right testis shows no mass, no swelling and no tenderness. Right testis is descended. Left testis shows no mass, no swelling and no tenderness. Left testis is descended. Circumcised. No penile erythema or penile tenderness. No discharge found.  Lymphadenopathy:    He has no cervical adenopathy.       Right: No inguinal adenopathy present.       Left: No inguinal adenopathy present.  Skin: He is not diaphoretic.  Vitals reviewed.   Lab Results  Component Value Date   WBC 11.2 (H) 10/15/2016   HGB 16.2 10/15/2016   HCT 47.8 10/15/2016   PLT 232.0 10/15/2016   GLUCOSE 84 10/15/2016   CHOL 210 (H) 10/15/2016   TRIG 219.0 (H) 10/15/2016   HDL 41.00 10/15/2016   LDLDIRECT 146.0 10/15/2016   LDLCALC 112 (H) 05/24/2015   ALT 57 (H) 10/15/2016   AST 37 10/15/2016   NA 140 10/15/2016   K 4.0 10/15/2016   CL 103 10/15/2016   CREATININE 0.98 10/15/2016   BUN 13 10/15/2016   CO2 26 10/15/2016   TSH 3.06 10/15/2016   PSA 0.61 10/15/2016   INR 1.05 05/23/2015   HGBA1C 5.5 10/15/2016    Dg Chest 2 View  Result Date: 05/23/2015 CLINICAL DATA:  Shortness of breath. EXAM: CHEST  2 VIEW COMPARISON:  June 18, 2013 FINDINGS: There is subtle airspace opacity in the periphery of the left upper lobe. There is  scarring in the lung bases which is stable. The heart size and pulmonary vascularity are normal. No adenopathy. No bone lesions. IMPRESSION: Subtle area of opacity in the left upper lobe suspicious for pneumonia. Followup PA and lateral chest radiographs recommended in 3-4 weeks following trial of antibiotic therapy to ensure resolution and exclude underlying malignancy. Stable mild scarring in the bases. No change in cardiac silhouette. Electronically Signed   By: Lowella Grip III M.D.   On: 05/23/2015 12:36   Ct Head (brain) Wo Contrast  Result Date: 05/23/2015 CLINICAL DATA:  Stroke symptoms with shortness of breath and diploplia. Symptoms now resolved. EXAM: CT HEAD WITHOUT CONTRAST TECHNIQUE: Contiguous axial images were obtained from the base of the skull through the vertex without intravenous contrast. COMPARISON:  None. FINDINGS: Skull and Sinuses:Negative for fracture or destructive process. Mild inflammatory mucosal thickening in the posterior left maxillary antrum. Orbits: No acute abnormality. Brain: No evidence of acute infarction, hemorrhage, hydrocephalus, or mass lesion/mass effect. There is confluent low-density in the deep cerebral white matter consistent with chronic small vessel disease  in this patient with history of smoking and hyperlipidemia. Mild for age generalized cerebral volume loss. IMPRESSION: 1. No acute findings. 2. Mild chronic small vessel disease. Electronically Signed   By: Monte Fantasia M.D.   On: 05/23/2015 12:40   Ct Chest W Contrast  Result Date: 05/23/2015 CLINICAL DATA:  Possible pneumonia on chest radiograph EXAM: CT CHEST WITH CONTRAST TECHNIQUE: Multidetector CT imaging of the chest was performed during intravenous contrast administration. CONTRAST:  84m OMNIPAQUE IOHEXOL 300 MG/ML  SOLN COMPARISON:  Chest radiographs dated 05/23/2015 FINDINGS: Mediastinum/Nodes: The heart is normal in size. No pericardial effusion. Mild coronary atherosclerosis in the LAD  (series 2/image 30). Mild atherosclerotic calcifications of the aortic arch. Small mediastinal lymph nodes which do not meet pathologic CT size criteria. Small bilateral thyroid nodules. Lungs/Pleura: Visualized lungs are essentially clear. Specifically, no findings suggestive of pneumonia. Eventration of the right hemidiaphragm with associated compressive atelectasis in the posterior right lung base. No suspicious pulmonary nodules. No pleural effusion or pneumothorax. Upper abdomen: Visualized upper abdomen is notable for a 5.8 cm left renal cyst. Musculoskeletal: Degenerative changes of the visualized thoracolumbar spine. IMPRESSION: No evidence of acute cardiopulmonary disease. Eventration of the right hemidiaphragm with associated mild right basilar atelectasis. Electronically Signed   By: SJulian HyM.D.   On: 05/23/2015 18:21   Mr Brain Wo Contrast  Result Date: 05/23/2015 CLINICAL DATA:  65y.o. 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 Subsequently drove home and then came to the ER with his wife.He feels normal and back to baseline now.He denies any associated weakness, numbness, slurring of speech etc.He denies any changes in his medications, he denies excessive alcohol use did drink half a beer last night. EXAM: MRI HEAD WITHOUT CONTRAST MRA HEAD WITHOUT CONTRAST TECHNIQUE: Multiplanar, multiecho pulse sequences of the brain and surrounding structures were obtained without intravenous  contrast. Angiographic images of the head were obtained using MRA technique without contrast. COMPARISON:  CT head earlier today. FINDINGS: MRI HEAD FINDINGS No evidence for acute infarction, hemorrhage, mass lesion, hydrocephalus, or extra-axial fluid. Moderate cerebral and cerebellar atrophy. Moderate T2 and FLAIR hyperintensities throughout the periventricular and subcortical white matter representing small vessel disease most likely. Pineal and cerebellar tonsils unremarkable. No upper cervical lesions. Partial empty sella. Prominent perivascular spaces suggesting chronic hypertension. Tiny focus of chronic hemorrhage RIGHT parietal periventricular white matter, nonspecific, but could represent sequelae of hypertension also. Proximal flow voids are maintained. Extracranial soft tissues unremarkable. MRA HEAD FINDINGS Dolichoectatic but widely patent internal carotid arteries and basilar artery. Vertebrals codominant. No intracranial stenosis or aneurysm. IMPRESSION: No acute intracranial findings. Atrophy and small vessel disease, see discussion above. Unremarkable MRA of the intracranial vasculature. Electronically Signed   By: JStaci RighterM.D.   On: 05/23/2015 18:18   Mr Mra Head/brain WXLCm  Result Date: 05/23/2015 CLINICAL DATA:  65y.o. male with PMH of PE 2 years ago following surgery on his Achilles tendon, history of malignant melanoma of  scalp N bridge of nose status post resection.Was in his usual state of health this morning, he was apparently playing tennis when he noticed some blurring of vision in his right eye and was unable to track the tennis ball, this persisted for a little while and then suddenly everything felt a little blurry and he had some transient dizziness.After this sees he also experienced vague twinge in the middle of his chest which radiated up his neck. Subsequently drove home and then came to the ER with his wife.He feels normal and back to baseline now.He denies any  associated weakness, numbness, slurring of speech etc.He denies any changes in his medications, he denies excessive alcohol use did drink half a beer last night. EXAM: MRI HEAD WITHOUT CONTRAST MRA HEAD WITHOUT CONTRAST TECHNIQUE: Multiplanar, multiecho pulse sequences of the brain and surrounding structures were obtained without intravenous contrast. Angiographic images of the head were obtained using MRA technique without contrast. COMPARISON:  CT head earlier today. FINDINGS: MRI HEAD FINDINGS No evidence for acute infarction, hemorrhage, mass lesion, hydrocephalus, or extra-axial fluid. Moderate cerebral and cerebellar atrophy. Moderate T2 and FLAIR hyperintensities throughout the periventricular and subcortical white matter representing small vessel disease most likely. Pineal and cerebellar tonsils unremarkable. No upper cervical lesions. Partial empty sella. Prominent perivascular spaces suggesting chronic hypertension. Tiny focus of chronic hemorrhage RIGHT parietal periventricular white matter, nonspecific, but could represent sequelae of hypertension also. Proximal flow voids are maintained. Extracranial soft tissues unremarkable. MRA HEAD FINDINGS Dolichoectatic but widely patent internal carotid arteries and basilar artery. Vertebrals codominant. No intracranial stenosis or aneurysm. IMPRESSION: No acute intracranial findings. Atrophy and small vessel disease, see discussion above. Unremarkable MRA of the intracranial vasculature. Electronically Signed   By: Staci Righter M.D.   On: 05/23/2015 18:18    Assessment & Plan:   Krue was seen today for annual exam and hyperlipidemia.  Diagnoses and all orders for this visit:  Benign prostatic hyperplasia without lower urinary tract symptoms- His PSA remains low so I'm not concerned about prostate cancer, he has no symptoms that need to be treated. -     Urinalysis, Routine w reflex microscopic (not at [facility]); Future -     CBC with  Differential/Platelet; Future -     PSA; Future  Hyperlipidemia with target LDL less than 130- his Framingham risk score is elevated at 12% but at this time he is not willing to take a statin for cardiovascular risk reduction -     TSH; Future -     CBC with Differential/Platelet; Future -     Lipid panel; Future  Hypocalcemia- his calcium level is normal now, I will check a PTH level to screen for hypoparathyroidism -     Comprehensive metabolic panel; Future -     PTH, intact and calcium; Future -     CBC with Differential/Platelet; Future  LFT elevation- this is most likely a mild case of fatty liver disease, he is negative for hepatitis C infection. -     Comprehensive metabolic panel; Future -     Hepatitis C antibody; Future -     CBC with Differential/Platelet; Future  Prediabetes- his A1c is down to 5.5%, improvement noted. -     Hemoglobin A1c; Future -     CBC with Differential/Platelet; Future  Need for prophylactic vaccination and inoculation against influenza -     Flu vaccine HIGH DOSE PF (Fluzone High dose)   I have discontinued Mr. [place] rosuvastatin, oxycodone, and NON  FORMULARY. I am also having him maintain his aspirin EC and Methylcobalamin (B12-ACTIVE PO).  Meds ordered this encounter  Medications  . DISCONTD: NON FORMULARY    Sig: [employer]  . Methylcobalamin (B12-ACTIVE PO)    Sig: Take 2 tablets by mouth.   See AVS for instructions about healthy living and anticipatory guidance.  Follow-up: Return if symptoms worsen or fail to improve.  Scarlette Calico, MD

## 2016-10-16 ENCOUNTER — Encounter: Payer: Self-pay | Admitting: Internal Medicine

## 2016-10-16 LAB — HEPATITIS C ANTIBODY: HCV Ab: NEGATIVE

## 2016-10-16 LAB — PTH, INTACT AND CALCIUM
Calcium: 9.5 mg/dL (ref 8.6–10.3)
PTH: 27 pg/mL (ref 14–64)

## 2016-10-16 NOTE — Assessment & Plan Note (Signed)

## 2016-11-07 DIAGNOSIS — L819 Disorder of pigmentation, unspecified: Secondary | ICD-10-CM | POA: Diagnosis not present

## 2016-11-07 DIAGNOSIS — L821 Other seborrheic keratosis: Secondary | ICD-10-CM | POA: Diagnosis not present

## 2016-11-07 DIAGNOSIS — Z85828 Personal history of other malignant neoplasm of skin: Secondary | ICD-10-CM | POA: Diagnosis not present

## 2016-11-07 DIAGNOSIS — L918 Other hypertrophic disorders of the skin: Secondary | ICD-10-CM | POA: Diagnosis not present

## 2016-11-07 DIAGNOSIS — D2271 Melanocytic nevi of right lower limb, including hip: Secondary | ICD-10-CM | POA: Diagnosis not present

## 2016-11-07 DIAGNOSIS — Z85820 Personal history of malignant melanoma of skin: Secondary | ICD-10-CM | POA: Diagnosis not present

## 2016-11-07 DIAGNOSIS — D2272 Melanocytic nevi of left lower limb, including hip: Secondary | ICD-10-CM | POA: Diagnosis not present

## 2016-11-07 DIAGNOSIS — L570 Actinic keratosis: Secondary | ICD-10-CM | POA: Diagnosis not present

## 2016-11-07 DIAGNOSIS — D2261 Melanocytic nevi of right upper limb, including shoulder: Secondary | ICD-10-CM | POA: Diagnosis not present

## 2016-11-07 DIAGNOSIS — D225 Melanocytic nevi of trunk: Secondary | ICD-10-CM | POA: Diagnosis not present

## 2017-02-11 ENCOUNTER — Encounter (HOSPITAL_COMMUNITY): Payer: Self-pay

## 2017-02-11 ENCOUNTER — Inpatient Hospital Stay (HOSPITAL_COMMUNITY)
Admission: EM | Disposition: A | Discharge status: Home / Self Care | Payer: Self-pay | Source: Home / Self Care | Attending: Cardiovascular Disease

## 2017-02-11 ENCOUNTER — Inpatient Hospital Stay (HOSPITAL_COMMUNITY): Payer: Medicare Other

## 2017-02-11 ENCOUNTER — Inpatient Hospital Stay (HOSPITAL_COMMUNITY)
Admission: EM | Admit: 2017-02-11 | Discharge: 2017-02-13 | DRG: 247 | Disposition: A | Discharge status: Home / Self Care | Payer: Medicare Other | Attending: Cardiovascular Disease | Admitting: Cardiovascular Disease

## 2017-02-11 DIAGNOSIS — R079 Chest pain, unspecified: Secondary | ICD-10-CM | POA: Diagnosis not present

## 2017-02-11 DIAGNOSIS — E785 Hyperlipidemia, unspecified: Secondary | ICD-10-CM | POA: Diagnosis present

## 2017-02-11 DIAGNOSIS — Z7982 Long term (current) use of aspirin: Secondary | ICD-10-CM | POA: Diagnosis not present

## 2017-02-11 DIAGNOSIS — R0789 Other chest pain: Secondary | ICD-10-CM | POA: Diagnosis not present

## 2017-02-11 DIAGNOSIS — K219 Gastro-esophageal reflux disease without esophagitis: Secondary | ICD-10-CM | POA: Diagnosis not present

## 2017-02-11 DIAGNOSIS — I2102 ST elevation (STEMI) myocardial infarction involving left anterior descending coronary artery: Principal | ICD-10-CM | POA: Diagnosis present

## 2017-02-11 DIAGNOSIS — Z8711 Personal history of peptic ulcer disease: Secondary | ICD-10-CM | POA: Diagnosis not present

## 2017-02-11 DIAGNOSIS — E7800 Pure hypercholesterolemia, unspecified: Secondary | ICD-10-CM | POA: Diagnosis present

## 2017-02-11 DIAGNOSIS — Z86711 Personal history of pulmonary embolism: Secondary | ICD-10-CM | POA: Diagnosis not present

## 2017-02-11 DIAGNOSIS — N400 Enlarged prostate without lower urinary tract symptoms: Secondary | ICD-10-CM | POA: Diagnosis present

## 2017-02-11 DIAGNOSIS — C9000 Multiple myeloma not having achieved remission: Secondary | ICD-10-CM | POA: Diagnosis not present

## 2017-02-11 DIAGNOSIS — I2510 Atherosclerotic heart disease of native coronary artery without angina pectoris: Secondary | ICD-10-CM

## 2017-02-11 DIAGNOSIS — Z8249 Family history of ischemic heart disease and other diseases of the circulatory system: Secondary | ICD-10-CM

## 2017-02-11 DIAGNOSIS — Z955 Presence of coronary angioplasty implant and graft: Secondary | ICD-10-CM

## 2017-02-11 DIAGNOSIS — Z85820 Personal history of malignant melanoma of skin: Secondary | ICD-10-CM

## 2017-02-11 DIAGNOSIS — I2121 ST elevation (STEMI) myocardial infarction involving left circumflex coronary artery: Secondary | ICD-10-CM

## 2017-02-11 DIAGNOSIS — F329 Major depressive disorder, single episode, unspecified: Secondary | ICD-10-CM | POA: Diagnosis present

## 2017-02-11 HISTORY — DX: Benign prostatic hyperplasia without lower urinary tract symptoms: N40.0

## 2017-02-11 HISTORY — DX: Hyperlipidemia, unspecified: E78.5

## 2017-02-11 HISTORY — PX: CORONARY STENT INTERVENTION: CATH118234

## 2017-02-11 HISTORY — PX: LEFT HEART CATH AND CORONARY ANGIOGRAPHY: CATH118249

## 2017-02-11 LAB — I-STAT CHEM 8, ED
BUN: 22 mg/dL — ABNORMAL HIGH (ref 6–20)
Calcium, Ion: 1.12 mmol/L — ABNORMAL LOW (ref 1.15–1.40)
Chloride: 104 mmol/L (ref 101–111)
Creatinine, Ser: 0.9 mg/dL (ref 0.61–1.24)
Glucose, Bld: 109 mg/dL — ABNORMAL HIGH (ref 65–99)
HEMATOCRIT: 48 % (ref 39.0–52.0)
Hemoglobin: 16.3 g/dL (ref 13.0–17.0)
POTASSIUM: 3.4 mmol/L — AB (ref 3.5–5.1)
Sodium: 143 mmol/L (ref 135–145)
TCO2: 25 mmol/L (ref 0–100)

## 2017-02-11 LAB — ECHOCARDIOGRAM COMPLETE
HEIGHTINCHES: 73 in
Weight: 3408 oz

## 2017-02-11 LAB — BASIC METABOLIC PANEL
Anion gap: 12 (ref 5–15)
BUN: 17 mg/dL (ref 6–20)
CO2: 25 mmol/L (ref 22–32)
Calcium: 9 mg/dL (ref 8.9–10.3)
Chloride: 105 mmol/L (ref 101–111)
Creatinine, Ser: 1.02 mg/dL (ref 0.61–1.24)
GFR calc Af Amer: >60 mL/min (ref >60)
GFR calc non Af Amer: >60 mL/min (ref >60)
Glucose, Bld: 112 mg/dL — ABNORMAL HIGH (ref 65–99)
Potassium: 3.4 mmol/L — ABNORMAL LOW (ref 3.5–5.1)
Sodium: 142 mmol/L (ref 135–145)

## 2017-02-11 LAB — CBC
HCT: 47.6 % (ref 39.0–52.0)
Hemoglobin: 15.9 g/dL (ref 13.0–17.0)
MCH: 30.4 pg (ref 26.0–34.0)
MCHC: 33.4 g/dL (ref 30.0–36.0)
MCV: 91 fL (ref 78.0–100.0)
Platelets: 218 10*3/uL (ref 150–400)
RBC: 5.23 MIL/uL (ref 4.22–5.81)
RDW: 13.9 % (ref 11.5–15.5)
WBC: 10.4 10*3/uL (ref 4.0–10.5)

## 2017-02-11 LAB — PROTIME-INR
INR: 1
Prothrombin Time: 13.2 seconds (ref 11.4–15.2)

## 2017-02-11 LAB — TROPONIN I
Troponin I: 10.85 ng/mL (ref <0.03)
Troponin I: >65 ng/mL (ref <0.03)

## 2017-02-11 LAB — POCT ACTIVATED CLOTTING TIME: ACTIVATED CLOTTING TIME: 549 s

## 2017-02-11 LAB — MRSA PCR SCREENING: MRSA by PCR: NEGATIVE

## 2017-02-11 LAB — POCT I-STAT TROPONIN I: TROPONIN I, POC: 0 ng/mL (ref 0.00–0.08)

## 2017-02-11 SURGERY — LEFT HEART CATH AND CORONARY ANGIOGRAPHY

## 2017-02-11 MED ORDER — LIDOCAINE HCL (PF) 1 % IJ SOLN
INTRAMUSCULAR | Status: DC | PRN
  Administered 2017-02-11: 2 mL via INTRADERMAL

## 2017-02-11 MED ORDER — NITROGLYCERIN 1 MG/10 ML FOR IR/CATH LAB
INTRA_ARTERIAL | Status: AC
  Filled 2017-02-11: qty 10

## 2017-02-11 MED ORDER — LABETALOL HCL 5 MG/ML IV SOLN: 10.0000 mg | INTRAVENOUS | Status: AC | PRN

## 2017-02-11 MED ORDER — FENTANYL CITRATE (PF) 100 MCG/2ML IJ SOLN
INTRAMUSCULAR | Status: AC
  Filled 2017-02-11: qty 2

## 2017-02-11 MED ORDER — IOPAMIDOL (ISOVUE-370) INJECTION 76%
INTRAVENOUS | Status: DC | PRN
  Administered 2017-02-11: 195 mL via INTRA_ARTERIAL

## 2017-02-11 MED ORDER — SODIUM CHLORIDE 0.9% FLUSH
3.0000 mL | Freq: Two times a day (BID) | INTRAVENOUS | Status: DC
  Administered 2017-02-12 (×2): 3 mL via INTRAVENOUS

## 2017-02-11 MED ORDER — ALPRAZOLAM 0.25 MG PO TABS: 0.2500 mg | ORAL_TABLET | Freq: Three times a day (TID) | ORAL | Status: DC | PRN

## 2017-02-11 MED ORDER — ALUM & MAG HYDROXIDE-SIMETH 200-200-20 MG/5ML PO SUSP
30.0000 mL | ORAL | Status: DC | PRN
  Administered 2017-02-11: 30 mL via ORAL
  Filled 2017-02-11: qty 30

## 2017-02-11 MED ORDER — ONDANSETRON HCL 4 MG/2ML IJ SOLN
4.0000 mg | Freq: Four times a day (QID) | INTRAMUSCULAR | Status: DC | PRN
  Administered 2017-02-12: 4 mg via INTRAVENOUS
  Filled 2017-02-11: qty 2

## 2017-02-11 MED ORDER — TICAGRELOR 90 MG PO TABS
ORAL_TABLET | ORAL | Status: AC
  Filled 2017-02-11: qty 2

## 2017-02-11 MED ORDER — GI COCKTAIL ~~LOC~~
30.0000 mL | Freq: Once | ORAL | Status: AC
  Administered 2017-02-11: 30 mL via ORAL
  Filled 2017-02-11: qty 30

## 2017-02-11 MED ORDER — BIVALIRUDIN 250 MG IV SOLR
INTRAVENOUS | Status: AC
  Filled 2017-02-11: qty 250

## 2017-02-11 MED ORDER — VERAPAMIL HCL 2.5 MG/ML IV SOLN
INTRAVENOUS | Status: AC
  Filled 2017-02-11: qty 2

## 2017-02-11 MED ORDER — HYDROCODONE-ACETAMINOPHEN 5-325 MG PO TABS
1.0000 | ORAL_TABLET | ORAL | Status: DC | PRN
  Administered 2017-02-11: 2 via ORAL
  Administered 2017-02-12 (×3): 1 via ORAL
  Filled 2017-02-11 (×2): qty 1
  Filled 2017-02-11: qty 2
  Filled 2017-02-11: qty 1

## 2017-02-11 MED ORDER — PANTOPRAZOLE SODIUM 40 MG PO TBEC
40.0000 mg | DELAYED_RELEASE_TABLET | Freq: Every day | ORAL | Status: DC
  Administered 2017-02-11 – 2017-02-13 (×3): 40 mg via ORAL
  Filled 2017-02-11 (×3): qty 1

## 2017-02-11 MED ORDER — SODIUM CHLORIDE 0.9 % IV SOLN
INTRAVENOUS | Status: DC | PRN
  Administered 2017-02-11 (×2): 1.75 mg/kg/h via INTRAVENOUS

## 2017-02-11 MED ORDER — HYDRALAZINE HCL 20 MG/ML IJ SOLN: 5.0000 mg | INTRAMUSCULAR | Status: AC | PRN

## 2017-02-11 MED ORDER — HEPARIN (PORCINE) IN NACL 2-0.9 UNIT/ML-% IJ SOLN
INTRAMUSCULAR | Status: DC | PRN
  Administered 2017-02-11: 1000 mL

## 2017-02-11 MED ORDER — HEPARIN SODIUM (PORCINE) 5000 UNIT/ML IJ SOLN
4000.0000 [IU] | Freq: Once | INTRAMUSCULAR | Status: AC
  Administered 2017-02-11: 4000 [IU] via SUBCUTANEOUS

## 2017-02-11 MED ORDER — ATORVASTATIN CALCIUM 80 MG PO TABS
80.0000 mg | ORAL_TABLET | Freq: Every day | ORAL | Status: DC
  Administered 2017-02-11 – 2017-02-12 (×2): 80 mg via ORAL
  Filled 2017-02-11 (×2): qty 1

## 2017-02-11 MED ORDER — BIVALIRUDIN BOLUS VIA INFUSION - CUPID
INTRAVENOUS | Status: DC | PRN
  Administered 2017-02-11: 72.45 mg via INTRAVENOUS

## 2017-02-11 MED ORDER — LIDOCAINE HCL (PF) 1 % IJ SOLN
INTRAMUSCULAR | Status: DC | PRN
  Administered 2017-02-11: 5 mL via INTRA_ARTERIAL

## 2017-02-11 MED ORDER — SODIUM CHLORIDE 0.9% FLUSH: 3.0000 mL | INTRAVENOUS | Status: DC | PRN

## 2017-02-11 MED ORDER — TICAGRELOR 90 MG PO TABS
90.0000 mg | ORAL_TABLET | Freq: Two times a day (BID) | ORAL | Status: DC
  Administered 2017-02-11 – 2017-02-13 (×4): 90 mg via ORAL
  Filled 2017-02-11 (×4): qty 1

## 2017-02-11 MED ORDER — TICAGRELOR 90 MG PO TABS
ORAL_TABLET | ORAL | Status: DC | PRN
  Administered 2017-02-11: 180 mg via ORAL

## 2017-02-11 MED ORDER — LIDOCAINE HCL (PF) 1 % IJ SOLN
INTRAMUSCULAR | Status: AC
  Filled 2017-02-11: qty 30

## 2017-02-11 MED ORDER — POTASSIUM CHLORIDE CRYS ER 20 MEQ PO TBCR
40.0000 meq | EXTENDED_RELEASE_TABLET | Freq: Once | ORAL | Status: AC
  Administered 2017-02-11: 40 meq via ORAL
  Filled 2017-02-11: qty 2

## 2017-02-11 MED ORDER — SODIUM CHLORIDE 0.9 % IV SOLN
INTRAVENOUS | Status: DC | PRN
  Administered 2017-02-11: 250 mL via INTRAVENOUS

## 2017-02-11 MED ORDER — ACETAMINOPHEN 325 MG PO TABS: 650.0000 mg | ORAL_TABLET | ORAL | Status: DC | PRN

## 2017-02-11 MED ORDER — ASPIRIN 81 MG PO CHEW
81.0000 mg | CHEWABLE_TABLET | Freq: Every day | ORAL | Status: DC
  Administered 2017-02-12 – 2017-02-13 (×2): 81 mg via ORAL
  Filled 2017-02-11 (×2): qty 1

## 2017-02-11 MED ORDER — FENTANYL CITRATE (PF) 100 MCG/2ML IJ SOLN
INTRAMUSCULAR | Status: DC | PRN
  Administered 2017-02-11: 25 ug via INTRAVENOUS

## 2017-02-11 MED ORDER — ASPIRIN 81 MG PO CHEW
CHEWABLE_TABLET | ORAL | Status: AC
  Administered 2017-02-11: 324 mg
  Filled 2017-02-11: qty 4

## 2017-02-11 MED ORDER — MORPHINE SULFATE (PF) 4 MG/ML IV SOLN: 2.0000 mg | INTRAVENOUS | Status: DC | PRN

## 2017-02-11 MED ORDER — SODIUM CHLORIDE 0.9 % IV SOLN
1.7500 mg/kg/h | INTRAVENOUS | Status: AC
  Administered 2017-02-11: 1.75 mg/kg/h via INTRAVENOUS
  Filled 2017-02-11 (×3): qty 250

## 2017-02-11 MED ORDER — HEPARIN SODIUM (PORCINE) 5000 UNIT/ML IJ SOLN: 60.0000 [IU]/kg | Freq: Once | INTRAMUSCULAR | Status: DC

## 2017-02-11 MED ORDER — SODIUM CHLORIDE 0.9 % IV SOLN: 250.0000 mL | INTRAVENOUS | Status: DC | PRN

## 2017-02-11 MED ORDER — HEPARIN SODIUM (PORCINE) 5000 UNIT/ML IJ SOLN
INTRAMUSCULAR | Status: AC
  Filled 2017-02-11: qty 1

## 2017-02-11 MED ORDER — HEPARIN (PORCINE) IN NACL 2-0.9 UNIT/ML-% IJ SOLN
INTRAMUSCULAR | Status: AC
  Filled 2017-02-11: qty 1000

## 2017-02-11 MED ORDER — IOPAMIDOL (ISOVUE-370) INJECTION 76%
INTRAVENOUS | Status: AC
  Filled 2017-02-11: qty 125

## 2017-02-11 MED ORDER — SODIUM CHLORIDE 0.9 % IV SOLN
INTRAVENOUS | Status: AC
  Administered 2017-02-11: 13:00:00 via INTRAVENOUS

## 2017-02-11 SURGICAL SUPPLY — 20 items
BALLN EUPHORA RX 2.0X12 (BALLOONS) ×3
BALLN ~~LOC~~ EMERGE MR 3.25X8 (BALLOONS) ×3
BALLOON EUPHORA RX 2.0X12 (BALLOONS) IMPLANT
BALLOON ~~LOC~~ EMERGE MR 3.25X8 (BALLOONS) IMPLANT
CATH EXPO 5FR ANG PIGTAIL 145 (CATHETERS) ×2 IMPLANT
CATH OPTITORQUE TIG 4.0 5F (CATHETERS) ×2 IMPLANT
CATH VISTA GUIDE 6FR XBLAD3.5 (CATHETERS) ×2 IMPLANT
DEVICE RAD COMP TR BAND LRG (VASCULAR PRODUCTS) ×2 IMPLANT
GLIDESHEATH SLEND A-KIT 6F 22G (SHEATH) ×2 IMPLANT
GUIDEWIRE INQWIRE 1.5J.035X260 (WIRE) IMPLANT
INQWIRE 1.5J .035X260CM (WIRE) ×3
KIT ENCORE 26 ADVANTAGE (KITS) ×2 IMPLANT
KIT HEART LEFT (KITS) ×3 IMPLANT
PACK CARDIAC CATHETERIZATION (CUSTOM PROCEDURE TRAY) ×3 IMPLANT
STENT SYNERGY DES 3X12 (Permanent Stent) ×2 IMPLANT
SYR MEDRAD MARK V 150ML (SYRINGE) ×3 IMPLANT
TRANSDUCER W/STOPCOCK (MISCELLANEOUS) ×3 IMPLANT
TUBING CIL FLEX 10 FLL-RA (TUBING) ×3 IMPLANT
WIRE ASAHI PROWATER 180CM (WIRE) ×2 IMPLANT
WIRE HI TORQ VERSACORE-J 145CM (WIRE) ×2 IMPLANT

## 2017-02-11 NOTE — ED Triage Notes (Signed)
Pt presents for evaluation of central CP starting around 0930 today. Pt AxO x4. Reports diaphoresis and weakness. Denies SOB. No meds PTA. Pt ambulatory.

## 2017-02-11 NOTE — ED Provider Notes (Signed)
[facility] DEPT Provider Note   CSN: [id number] Arrival date & time: 02/11/17  1051     History   Chief Complaint Chief Complaint  Patient presents with  . Chest Pain    HPI John Mccarthy is a 66 y.o. male.  The history is provided by the patient.  Chest Pain   This is a new problem. The current episode started 1 to 2 hours ago. The problem occurs constantly. The problem has not changed since onset.The pain is associated with exertion (was playing tennis when pain started). Pain location: over the chest. The pain is at a severity of 7/10. The pain is severe. The quality of the pain is described as heavy and pressure-like. The pain does not radiate. Duration of episode(s) is 1 hour. Associated symptoms include diaphoresis and shortness of breath. Pertinent negatives include no abdominal pain, no cough, no lower extremity edema, no nausea, no vomiting and no weakness. He has tried nothing for the symptoms. The treatment provided no relief. There are no known risk factors.  His past medical history is significant for PE.  Pertinent negatives for past medical history include no CAD, no CHF, no diabetes, no hyperlipidemia, no hypertension and no MI.    Past Medical History:  Diagnosis Date  . Melanoma ([place])   . Multiple myeloma ([place])   . Pulmonary embolism ([place]) 06/15/2013  . Skin cancer (melanoma) [facility])     Patient Active Problem List   Diagnosis Date Noted  . LFT elevation 10/15/2016  . Hypocalcemia 05/31/2015  . Primary osteoarthritis of both knees 05/31/2015  . TIA (transient ischemic attack) 05/23/2015  . BPH (benign prostatic hyperplasia) 02/13/2015  . Hyperlipidemia with target LDL less than 130 07/16/2013  . Routine general medical examination at a health care facility 06/18/2013  . PUD (peptic ulcer disease) 06/15/2013    Past Surgical History:  Procedure Laterality Date  . ACHILLES TENDON SURGERY Right 05/18/2013  . ESOPHAGOGASTRODUODENOSCOPY  09/24/2012     Procedure: ESOPHAGOGASTRODUODENOSCOPY (EGD);  Surgeon: Lear Ng, MD;  Location: [facility] ENDOSCOPY;  Service: Endoscopy;  Laterality: N/A;  . HAND SURGERY         Home Medications    Prior to Admission medications   Medication Sig Start Date End Date Taking? Authorizing Provider  aspirin EC 81 MG tablet Take 1 tablet (81 mg total) by mouth daily. 05/31/15   Janith Lima, MD  Methylcobalamin (B12-ACTIVE PO) Take 2 tablets by mouth.    Historical Provider, MD    Family History Family History  Problem Relation Age of Onset  . Heart failure Father   . Cancer Neg Hx   . COPD Neg Hx   . Stroke Neg Hx   . Kidney disease Neg Hx   . Hypertension Neg Hx   . Hyperlipidemia Neg Hx   . Heart disease Neg Hx   . Hearing loss Neg Hx   . Early death Neg Hx   . Drug abuse Neg Hx   . Diabetes Neg Hx   . Depression Neg Hx     Social History Social History  Substance Use Topics  . Smoking status: Never Smoker  . Smokeless tobacco: Never Used  . Alcohol use 3.0 oz/week    5 Shots of liquor per week     Comment: occassional     Allergies   Crestor [rosuvastatin calcium] and Lactose intolerance (gi)   Review of Systems Review of Systems  Constitutional: Positive for diaphoresis.  Respiratory: Positive for shortness  of breath. Negative for cough.   Cardiovascular: Positive for chest pain.  Gastrointestinal: Negative for abdominal pain, nausea and vomiting.  Neurological: Negative for weakness.  All other systems reviewed and are negative.    Physical Exam Updated Vital Signs BP 130/74 (BP Location: Right Arm)   Pulse 83   Temp 98.4 F (36.9 C) (Oral)   Resp 18   SpO2 95%   Physical Exam  Constitutional: He is oriented to person, place, and time. He appears well-developed and well-nourished. No distress.  HENT:  Head: Normocephalic and atraumatic.  Mouth/Throat: Oropharynx is clear and moist.  Eyes: Conjunctivae and EOM are normal. Pupils are equal, round, and  reactive to light.  Neck: Normal range of motion. Neck supple.  Cardiovascular: Normal rate, regular rhythm and intact distal pulses.   No murmur heard. Pulmonary/Chest: Effort normal and breath sounds normal. No respiratory distress. He has no wheezes. He has no rales.  Abdominal: Soft. He exhibits no distension. There is no tenderness. There is no rebound and no guarding.  Musculoskeletal: Normal range of motion. He exhibits no edema or tenderness.  Neurological: He is alert and oriented to person, place, and time.  Skin: Skin is warm. No rash noted. He is diaphoretic. No erythema.  Psychiatric: He has a normal mood and affect. His behavior is normal.  Nursing note and vitals reviewed.    ED Treatments / Results  Labs (all labs ordered are listed, but only abnormal results are displayed) Labs Reviewed  I-STAT CHEM 8, ED - Abnormal; Notable for the following:       Result Value   Potassium 3.4 (*)    BUN 22 (*)    Glucose, Bld 109 (*)    Calcium, Ion 1.12 (*)    All other components within normal limits  BASIC METABOLIC PANEL  CBC  PROTIME-INR  I-STAT TROPOININ, ED    EKG  EKG Interpretation  Date/Time:  Tuesday February 11 2017 10:56:28 EDT Ventricular Rate:  81 PR Interval:  200 QRS Duration: 86 QT Interval:  368 QTC Calculation: 427 R Axis:   -17 Text Interpretation:  STEMI Normal sinus rhythm Minimal voltage criteria for LVH, may be normal variant ST elevation consider anterolateral injury or acute infarct STEMI Reconfirmed by [facility]  MD, Loree Fee ([id number]) on 02/11/2017 11:21:07 AM       Radiology No results found.  Procedures Procedures (including critical care time)  Medications Ordered in ED Medications  aspirin 81 MG chewable tablet (not administered)     Initial Impression / Assessment and Plan / ED Course  I have reviewed the triage vital signs and the nursing notes.  Pertinent labs & imaging results that were available during my care of the patient  were reviewed by me and considered in my medical decision making (see chart for details).     Patient presenting today with by private vehicle with complaints consistent with ACS. It started while he was playing tennis for the pain has not resolved. He is diaphoretic and mildly short of breath. He has no prior cardiac history. He only takes an aspirin intermittently has no other medical issues. EKG is consistent with a lateral MI with reciprocal changes inferiorly. Code STEMI was called. Patient was given heparin, aspirin and labs were drawn. Dr. Gwenlyn Found with cardiology came and evaluated the patient will be taking him to the Cath Lab. CRITICAL CARE Performed by: Blanchie Dessert Total critical care time: 30 minutes Critical care time was exclusive of separately billable procedures and treating  other patients. Critical care was necessary to treat or prevent imminent or life-threatening deterioration. Critical care was time spent personally by me on the following activities: development of treatment plan with patient and/or surrogate as well as nursing, discussions with consultants, evaluation of patient's response to treatment, examination of patient, obtaining history from patient or surrogate, ordering and performing treatments and interventions, ordering and review of laboratory studies, ordering and review of radiographic studies, pulse oximetry and re-evaluation of patient's condition.   Final Clinical Impressions(s) / ED Diagnoses   Final diagnoses:  ST elevation myocardial infarction involving left anterior descending (LAD) coronary artery [facility])    New Prescriptions New Prescriptions   No medications on file     Blanchie Dessert, MD 02/11/17 1121

## 2017-02-11 NOTE — Progress Notes (Signed)
  Echocardiogram 2D Echocardiogram has been performed.  John Mccarthy L Androw 02/11/2017, 3:00 PM

## 2017-02-11 NOTE — ED Notes (Signed)
Pt car keys given to security. Notified security that pt wife is on the way.

## 2017-02-11 NOTE — H&P (Signed)
Patient ID: KERI TAVELLA MRN: [id number], DOB/AGE: [birth year]   Admit date: 02/11/2017   Primary Physician: Scarlette Calico, MD Primary Cardiologist: New to Dr. Gwenlyn Found   Pt. Profile:  John Mccarthy is a 66 y.o. male with a history of HLD (not on statin due to myalgias), PE, multiple myeloma who presented to [facility] ED for evaluation of chest pressure and found STEMI.   HPI: As above. He has a history of hypercholesterolemia.  He has tried to take Crestor in past and it caused muscle aches so he stopped taking it.  He plays tennis 2 times/week. Noted chest discomfort recently while playing. Resolved with rest. Felt indigestion. This morning he started playing tennis around 9 am. After play for 30 minus he developed chest pressure. Somewhat improved with rest. He continued to played until 10:30 am with 8/10 chest pressure. Associated with diaphoresis. His chest pressure persisted event with rest. This is new and worst episode. He came to ER.  EKG showed anterior lateral ST elevation with depression in inferior lead. Code STEMI called and taken to cath lab.   Sister had stent placement in her 69s. Father died of CHF in 55s. Non smoker. Social drinker.   Problem List  Past Medical History:  Diagnosis Date  . BPH (benign prostatic hyperplasia)   . Hyperlipidemia   . Melanoma ([place])   . Multiple myeloma ([place])   . Pulmonary embolism ([place]) 06/15/2013  . Skin cancer (melanoma) [facility])     Past Surgical History:  Procedure Laterality Date  . ACHILLES TENDON SURGERY Right 05/18/2013  . ESOPHAGOGASTRODUODENOSCOPY  09/24/2012   Procedure: ESOPHAGOGASTRODUODENOSCOPY (EGD);  Surgeon: Lear Ng, MD;  Location: [facility] ENDOSCOPY;  Service: Endoscopy;  Laterality: N/A;  . HAND SURGERY       Allergies  Allergies  Allergen Reactions  . Crestor [Rosuvastatin Calcium] Other (See Comments)    Muscle aches  . Lactose Intolerance (Gi) Diarrhea     Home Medications  Prior to  Admission medications   Medication Sig Start Date End Date Taking? Authorizing Provider  aspirin EC 81 MG tablet Take 1 tablet (81 mg total) by mouth daily. 05/31/15  Yes Janith Lima, MD  Methylcobalamin (B12-ACTIVE PO) Take 2 tablets by mouth.    Historical Provider, MD    Family History  Family History  Problem Relation Age of Onset  . Heart failure Father   . Cancer Neg Hx   . COPD Neg Hx   . Stroke Neg Hx   . Kidney disease Neg Hx   . Hypertension Neg Hx   . Hyperlipidemia Neg Hx   . Heart disease Neg Hx   . Hearing loss Neg Hx   . Early death Neg Hx   . Drug abuse Neg Hx   . Diabetes Neg Hx   . Depression Neg Hx    Family Status  Relation Status  . Father Deceased  . Mother Deceased  . Sister Alive  . Sister Alive  . Neg Hx     Social History  Social History   Social History  . Marital status: Married    Spouse name: N/A  . Number of children: N/A  . Years of education: N/A   Occupational History  . Not on file.   Social History Main Topics  . Smoking status: Never Smoker  . Smokeless tobacco: Never Used  . Alcohol use 3.0 oz/week    5 Shots of liquor per week     Comment:  occassional  . Drug use: No  . Sexual activity: Yes    Birth control/ protection: None   Other Topics Concern  . Not on file   Social History Narrative  . No narrative on file     All other systems reviewed and are otherwise negative except as noted above.  Physical Exam  Blood pressure 119/75, pulse (!) 0, temperature 98.4 F (36.9 C), temperature source Oral, resp. rate (!) 4, height 6' 1"  (1.854 m), weight 213 lb (96.6 kg), SpO2 (!) 0 %.  General: Pleasant, NAD Psych: Normal affect. Neuro: Alert and oriented X 3. Moves all extremities spontaneously. HEENT: Normal  Neck: Supple without bruits or JVD. Lungs:  Resp regular and unlabored, CTA. Heart: RRR no s3, s4, or murmurs. R wrist site stable.  Abdomen: Soft, non-tender, non-distended, BS + x 4.  Extremities: No  clubbing, cyanosis or edema. DP/PT/Radials 2+ and equal bilaterally.  Labs  No results for input(s): CKTOTAL, CKMB, TROPONINI in the last 72 hours. Lab Results  Component Value Date   WBC 10.4 Feb 24, 2017   HGB 16.3 02-24-2017   HCT 48.0 2017-02-24   MCV 91.0 2017/02/24   PLT 218 02-24-2017     Recent Labs Lab 02/24/17 1100 2017/02/24 1116  NA 142 143  K 3.4* 3.4*  CL 105 104  CO2 25  --   BUN 17 22*  CREATININE 1.02 0.90  CALCIUM 9.0  --   GLUCOSE 112* 109*   Lab Results  Component Value Date   CHOL 210 (H) 10/15/2016   HDL 41.00 10/15/2016   LDLCALC 112 (H) 05/24/2015   TRIG 219.0 (H) 10/15/2016   Lab Results  Component Value Date   DDIMER 0.28 02/13/2015     Radiology/Studies  No results found.  Cath Feb 24, 2017 Coronary Stent Intervention  Left Heart Cath and Coronary Angiography  Conclusion     Prox RCA to Mid RCA lesion, 60 %stenosed.  There is moderate left ventricular systolic dysfunction.  LV end diastolic pressure is mildly elevated.  The left ventricular ejection fraction is 45-50% by visual estimate.  Ost LAD to Prox LAD lesion, 100 %stenosed.  Post intervention, there is a 0% residual stenosis.  A stent was successfully placed.   IMPRESSION: Successful proximal LAD PCI and drug-eluting stenting in the setting of an anterior STEMI with a door to balloon time of 21 minutes. His EF was approximately 45% with moderate anteroapical hypokinesia. He does have a moderate proximal dominant RCA stenosis that does not appear to be physiologically significant and will be treated medically at this time. He will be treated with fiber therapy for at least 1 year as well as beta blocker and high-dose statin therapy.   Echo 05/2015  EF: 55% -   60%  ------------------------------------------------------------------- Indications:      TIA 435.9.  ------------------------------------------------------------------- History:   PMH:  Pulmonary embolism.  Community-Acquired Pneumonia. Risk factors:  Dyslipidemia.  ------------------------------------------------------------------- Study Conclusions  - Left ventricle: The cavity size was normal. Systolic function was   normal. The estimated ejection fraction was in the range of 55%   to 60%. Wall motion was normal; there were no regional wall   motion abnormalities. - Left atrium: The atrium was mildly dilated. - Atrial septum: No defect or patent foramen ovale was identified.  ASSESSMENT AND PLAN  1. Acute ST elevation myocardial infarction (STEMI) involving left anterior descending (LAD) coronary artery (HCC)  - Emergent cath showed 100% stenosed prox LAD s/p DES. 60% pro RCA -> treat  medically.  - Continue ASA, Brilinta and statin. Still having chest pressure, improving. Post PCI EKG showed improved segment. Hold adding BB today given soft BP.   - pt reports hx of indigestion and gastric ulcer. Currently feel heart burn. Will give GI cocktail x 1. Start protonix 26m qd.   2. HLD  - 10/15/2016: Cholesterol 210; HDL 41.00; Triglycerides 219.0; VLDL 43.8  - Intolerance to Crestor. Will try Lipitor 818m if unable to tolerate. Refer to lipid clinic as outpatient. Repeat lipid panel in AM.   Signed, Noelle Hoogland, PA-C 02/11/2017, 2:04 PM Pager [phone]

## 2017-02-12 LAB — BASIC METABOLIC PANEL
Anion gap: 10 (ref 5–15)
BUN: 9 mg/dL (ref 6–20)
CHLORIDE: 102 mmol/L (ref 101–111)
CO2: 25 mmol/L (ref 22–32)
CREATININE: 0.84 mg/dL (ref 0.61–1.24)
Calcium: 8.6 mg/dL — ABNORMAL LOW (ref 8.9–10.3)
Glucose, Bld: 121 mg/dL — ABNORMAL HIGH (ref 65–99)
POTASSIUM: 3.8 mmol/L (ref 3.5–5.1)
SODIUM: 137 mmol/L (ref 135–145)

## 2017-02-12 LAB — CBC
HEMATOCRIT: 45.8 % (ref 39.0–52.0)
HEMOGLOBIN: 15.2 g/dL (ref 13.0–17.0)
MCH: 30.5 pg (ref 26.0–34.0)
MCHC: 33.2 g/dL (ref 30.0–36.0)
MCV: 91.8 fL (ref 78.0–100.0)
Platelets: 212 10*3/uL (ref 150–400)
RBC: 4.99 MIL/uL (ref 4.22–5.81)
RDW: 13.9 % (ref 11.5–15.5)
WBC: 14.8 10*3/uL — AB (ref 4.0–10.5)

## 2017-02-12 LAB — LIPID PANEL
CHOLESTEROL: 197 mg/dL (ref 0–200)
HDL: 40 mg/dL — AB (ref >40)
LDL CALC: 122 mg/dL — AB (ref 0–99)
TRIGLYCERIDES: 175 mg/dL — AB (ref <150)
Total CHOL/HDL Ratio: 4.9 RATIO
VLDL: 35 mg/dL (ref 0–40)

## 2017-02-12 LAB — TROPONIN I
TROPONIN I: 58.47 ng/mL — AB (ref <0.03)
TROPONIN I: 28.41 ng/mL — AB (ref <0.03)

## 2017-02-12 MED ORDER — METOPROLOL TARTRATE 12.5 MG HALF TABLET
12.5000 mg | ORAL_TABLET | Freq: Two times a day (BID) | ORAL | Status: DC
  Administered 2017-02-12 – 2017-02-13 (×3): 12.5 mg via ORAL
  Filled 2017-02-12 (×3): qty 1

## 2017-02-12 NOTE — Progress Notes (Signed)
Progress Note  Patient Name: John Mccarthy Date of Encounter: 02/12/2017  Primary Cardiologist: Dr. Quay Burow  Subjective   John Mccarthy is postop day 1 anterior STEMI with PCI and drug-eluting stenting of the proximal LAD using a synergy drug-eluting stent. This was performed radially. He denies chest pain or shortness of breath.  Inpatient Medications    Scheduled Meds: . aspirin  81 mg Oral Daily  . atorvastatin  80 mg Oral q1800  . metoprolol tartrate  12.5 mg Oral BID  . pantoprazole  40 mg Oral Daily  . sodium chloride flush  3 mL Intravenous Q12H  . ticagrelor  90 mg Oral BID   Continuous Infusions:  PRN Meds: sodium chloride, acetaminophen, ALPRAZolam, alum & mag hydroxide-simeth, HYDROcodone-acetaminophen, morphine injection, ondansetron (ZOFRAN) IV, sodium chloride flush   Vital Signs    Vitals:   02/12/17 0300 02/12/17 0400 02/12/17 0500 02/12/17 0815  BP: 124/73 109/80 119/76 132/82  Pulse: 75 76 75 82  Resp: (!) 24 (!) 22 18 (!) 29  Temp:  98.9 F (37.2 C)  98.7 F (37.1 C)  TempSrc:  Oral  Oral  SpO2: 96% 95% 94% 97%  Weight:      Height:        Intake/Output Summary (Last 24 hours) at 02/12/17 0934 Last data filed at 02/12/17 0800  Gross per 24 hour  Intake           986.25 ml  Output             1075 ml  Net           -88.75 ml   Filed Weights   02/11/17 1134  Weight: 213 lb (96.6 kg)    Telemetry    Normal sinus rhythm in the 80s without significant arrhythmias. There were occasional PVCs noted. - Personally Reviewed  ECG    Normal sinus rhythm with septal Q waves and nonspecific ST and T-wave changes. - Personally Reviewed  Physical Exam   GEN: No acute distress.   Neck: No JVD Cardiac: RRR, no murmurs, rubs, or gallops.  Respiratory: Clear to auscultation bilaterally. GI: Soft, nontender, non-distended  MS: No edema; No deformity. Neuro:  Nonfocal  Psych: Normal affect  Right radial access site appears  well-healed. Labs    Chemistry Recent Labs Lab 02/11/17 1100 02/11/17 1116 02/12/17 0212  NA 142 143 137  K 3.4* 3.4* 3.8  CL 105 104 102  CO2 25  --  25  GLUCOSE 112* 109* 121*  BUN 17 22* 9  CREATININE 1.02 0.90 0.84  CALCIUM 9.0  --  8.6*  GFRNONAA >60  --  >60  GFRAA >60  --  >60  ANIONGAP 12  --  10     Hematology Recent Labs Lab 02/11/17 1100 02/11/17 1116  WBC 10.4  --   RBC 5.23  --   HGB 15.9 16.3  HCT 47.6 48.0  MCV 91.0  --   MCH 30.4  --   MCHC 33.4  --   RDW 13.9  --   PLT 218  --     Cardiac Enzymes Recent Labs Lab 02/11/17 1335 02/11/17 1931 02/12/17 0228  TROPONINI 10.85* >65.00* 58.47*    Recent Labs Lab 02/11/17 1114  TROPIPOC 0.00     BNPNo results for input(s): BNP, PROBNP in the last 168 hours.   DDimer No results for input(s): DDIMER in the last 168 hours.   Radiology    Dg Chest [place] 1  View  Result Date: 02/11/2017 CLINICAL DATA:  LEFT-sided chest pain EXAM: PORTABLE CHEST 1 VIEW COMPARISON:  Radiograph 05/23/2015 FINDINGS: Chronic elevation the RIGHT hemidiaphragm. Normal cardiac silhouette. There is poor definition of the LEFT hemidiaphragm suggesting pleural effusion. IMPRESSION: 1. Chronic elevation of the RIGHT hemidiaphragm. 2. Suspect LEFT pleural effusion. Electronically Signed   By: Suzy Bouchard M.D.   On: 02/11/2017 15:51    Cardiac Studies   2-D echocardiogram performed 02/11/17  Impressions:  - Hypokinesis of the septum and apical akinesis; overall mildly   reduced LV systolic function; grade 2 diastolic dysfunction;   cannot R/O apical thrombus; suggest limited repeat study with   definity or cardiac MRI to further assess; mild John; mild LAE.  Patient Profile    John Mccarthy is postop day 1 anterior STEMI with PCI and drug-eluting stenting of the proximal LAD using a synergy drug-eluting stent. This was performed radially. He denies chest pain or shortness of breath. He does have residual moderate  proximal dominant RCA stenosis which will be treated medically. He has mild to moderate LV dysfunction with anteroapical wall motion abnormality.    Assessment & Plan    1: Acute anterior STEMI-postop day one anterior STEMI treated radially with a synergy drug-eluting stent. His EF is in the 45-50% range with anteroapical wall motion abnormality. He does have residual moderate proximal dominant RCA stenosis which will be treated medically at this time. We will add low-dose beta blocker in addition to his dual antiplatelet therapy.   2: Hyperlipidemia-on high-dose statin therapy  The patient is hemodynamically and electrically stable. He is asymptomatic and up to chair. We will transfer him to telemetry. Cardiac rehabilitation will assist. Plan on discharge on Friday.  Angelina Sheriff, MD  02/12/2017, 9:34 AM

## 2017-02-12 NOTE — Progress Notes (Addendum)
Benefit check sent for:  icagrelor [facility]) tablet 90 mg Dose: 90 mg Freq: 2 times daily Route: PO  Will post results when available  Pt copay will be $44- prior Josem Kaufmann is not required

## 2017-02-12 NOTE — Progress Notes (Signed)
CARDIAC REHAB PHASE I   PRE:  Rate/Rhythm: 72 SR    BP: sitting 100/64    SaO2:   MODE:  Ambulation: 470 ft   POST:  Rate/Rhythm: 94 SR    BP: sitting 119/71     SaO2: 97 RA  Tolerated well, no c/o. Pt eager to get moving. Asking if he can go to car show Friday and play poker at his house Saturday. Began ed with family present for most. Will refer to [place]. Will f/u tomorrow for more ed. Pt understands Brilinta importance. He was not on any meds PTA. 1127-1210   [place], ACSM 02/12/2017 12:08 PM

## 2017-02-13 MED ORDER — TICAGRELOR 90 MG PO TABS: 90.0000 mg | ORAL_TABLET | Freq: Two times a day (BID) | ORAL | 3 refills | Status: DC

## 2017-02-13 MED ORDER — ATORVASTATIN CALCIUM 80 MG PO TABS: 80.0000 mg | ORAL_TABLET | Freq: Every day | ORAL | 6 refills | Status: DC

## 2017-02-13 MED ORDER — PANTOPRAZOLE SODIUM 40 MG PO TBEC: 40.0000 mg | DELAYED_RELEASE_TABLET | Freq: Every day | ORAL | 6 refills | Status: DC

## 2017-02-13 MED ORDER — METOPROLOL TARTRATE 25 MG PO TABS: 12.5000 mg | ORAL_TABLET | Freq: Two times a day (BID) | ORAL | 6 refills | Status: DC

## 2017-02-13 MED ORDER — MAGNESIUM HYDROXIDE 400 MG/5ML PO SUSP
30.0000 mL | Freq: Once | ORAL | Status: AC
  Administered 2017-02-13: 30 mL via ORAL
  Filled 2017-02-13: qty 30

## 2017-02-13 MED FILL — Nitroglycerin IV Soln 100 MCG/ML in D5W: INTRA_ARTERIAL | Qty: 10 | Status: AC

## 2017-02-13 NOTE — Discharge Summary (Signed)
Discharge Summary    Patient ID: John Mccarthy,  MRN: [id number], DOB/AGE: 12-08-1950 66 y.o.  Admit date: 02/11/2017 Discharge date: 02/13/2017  Primary Care Provider: Scarlette Calico Primary Cardiologist: Dr. Gwenlyn Found   Discharge Diagnoses    Active Problems:   STEMI (ST elevation myocardial infarction) [facility])   Acute ST elevation myocardial infarction (STEMI) involving left anterior descending (LAD) coronary artery (HCC)   Hyperlipidemia   Possible GERD Allergies Allergies  Allergen Reactions  . Crestor [Rosuvastatin Calcium] Other (See Comments)    Muscle aches  . Lactose Intolerance (Gi) Diarrhea    Diagnostic Studies/Procedures    Coronary Stent Intervention    02/11/17  Left Heart Cath and Coronary Angiography  Conclusion     Prox RCA to Mid RCA lesion, 60 %stenosed.  There is moderate left ventricular systolic dysfunction.  LV end diastolic pressure is mildly elevated.  The left ventricular ejection fraction is 45-50% by visual estimate.  Ost LAD to Prox LAD lesion, 100 %stenosed.  Post intervention, there is a 0% residual stenosis.  A stent was successfully placed.  IMPRESSION:Successful proximal LAD PCI and drug-eluting stenting in the setting of an anterior STEMI with a door to balloon time of 21 minutes.His EF was approximately 45% with moderate anteroapical hypokinesia. He does have a moderate proximal dominant RCA stenosis that does not appear to be physiologically significant and will be treated medically at this time. He will be treated with fiber therapy for at least 1 year as well as beta blocker and high-dose statin therapy.   2-D echocardiogram performed 02/11/17  Impressions:  - Hypokinesis of the septum and apical akinesis; overall mildly reduced LV systolic function; grade 2 diastolic dysfunction; cannot R/O apical thrombus; suggest limited repeat study with definity or cardiac MRI to further assess; mild MR; mild  LAE.  History of Present Illness     John Mccarthy is a 66 y.o. male with a history of HLD (not on statin due to myalgias), PE, multiple myeloma who presented to [facility] ED for evaluation of chest pressure and found STEMI.   He has a history of hypercholesterolemia. He has tried to take Crestor in past and it caused muscle aches so he stopped taking it.  He plays tennis 2 times/week. Noted chest discomfort recently while playing. Resolved with rest. Felt indigestion. This morning he started playing tennis around 9 am. After play for 30 minus he developed chest pressure. Somewhat improved with rest. He continued to played until 10:30 am with 8/10 chest pressure. Associated with diaphoresis. His chest pressure persisted event with rest. This is new and worst episode. He came to ER.  EKG showed anterior lateral ST elevation with depression in inferior lead. Code STEMI called and taken to cath lab.   Sister had stent placement in her 3s. Father died of CHF in 42s. Non smoker. Social drinker.   Hospital Course     Consultants: None  1. Acute ST elevation myocardial infarction (STEMI) involving left anterior descending (LAD) coronary artery (HCC)  - Emergent cath showed 100% stenosed prox LAD s/p DES. 60% pro RCA -> treat medically.  - Continue ASA, Brilinta and statin. Post PCI EKG showed improved segment. Ambulated without angina or dyspnea. referral made for CRP II. On metoprolol 12.59m BID, unable to add ACE or ARB due to soft BP. Consider changing metoprolol to coreg as blood pressure allows and/or addition of ACE/ARB.   2. HLD  - 02/12/2017: Cholesterol 197; HDL 40; LDL Cholesterol 122;  Triglycerides 175; VLDL 35  - Intolerance to Crestor. Tolerated Lipitor 25m in hospital. Refer to lipid clinic as outpatient as needed.  - Consider OP f/u labs 6-8 weeks given statin initiation this admission.   3. Possible GERD - Pt reports hx of indigestion and gastric ulcer. Currently feel heart  burn. Symptoms resolvedon GI cocktail x 1 and  protonix 480mqd.    The patient has been seen by Dr. BeGwenlyn Foundoday and deemed ready for discharge home. All follow-up appointments have been scheduled. Discharge medications are listed below.  _____________   Discharge Vitals Blood pressure (!) 97/57, pulse 77, temperature 99.6 F (37.6 C), temperature source Oral, resp. rate 20, height 6' 1" (1.854 m), weight 210 lb 4.8 oz (95.4 kg), SpO2 99 %.  Filed Weights   02/11/17 1134 02/13/17 0320  Weight: 213 lb (96.6 kg) 210 lb 4.8 oz (95.4 kg)    Labs & Radiologic Studies     CBC  Recent Labs  02/11/17 1100 02/11/17 1116 02/12/17 1008  WBC 10.4  --  14.8*  HGB 15.9 16.3 15.2  HCT 47.6 48.0 45.8  MCV 91.0  --  91.8  PLT 218  --  21767 Basic Metabolic Panel  Recent Labs  02/11/17 1100 02/11/17 1116 02/12/17 0212  NA 142 143 137  K 3.4* 3.4* 3.8  CL 105 104 102  CO2 25  --  25  GLUCOSE 112* 109* 121*  BUN 17 22* 9  CREATININE 1.02 0.90 0.84  CALCIUM 9.0  --  8.6*   Liver Function Tests No results for input(s): AST, ALT, ALKPHOS, BILITOT, PROT, ALBUMIN in the last 72 hours. No results for input(s): LIPASE, AMYLASE in the last 72 hours. Cardiac Enzymes  Recent Labs  02/11/17 1931 02/12/17 0228 02/12/17 1008  TROPONINI >65.00* 58.47* 28.41*   BNP Invalid input(s): POCBNP D-Dimer No results for input(s): DDIMER in the last 72 hours. Hemoglobin A1C No results for input(s): HGBA1C in the last 72 hours. Fasting Lipid Panel  Recent Labs  02/12/17 0212  CHOL 197  HDL 40*  LDLCALC 122*  TRIG 175*  CHOLHDL 4.9   Thyroid Function Tests No results for input(s): TSH, T4TOTAL, T3FREE, THYROIDAB in the last 72 hours.  Invalid input(s): FREET3  Dg Chest [place] 1 View  Result Date: 02/11/2017 CLINICAL DATA:  LEFT-sided chest pain EXAM: PORTABLE CHEST 1 VIEW COMPARISON:  Radiograph 05/23/2015 FINDINGS: Chronic elevation the RIGHT hemidiaphragm. Normal cardiac  silhouette. There is poor definition of the LEFT hemidiaphragm suggesting pleural effusion. IMPRESSION: 1. Chronic elevation of the RIGHT hemidiaphragm. 2. Suspect LEFT pleural effusion. Electronically Signed   By: StSuzy Bouchard.D.   On: 02/11/2017 15:51    Disposition   Pt is being discharged home today in good condition.  Follow-up Plans & Appointments    Follow-up Information    [facility] on 02/25/2017.   Specialties:  Cardiology, Radiology Why:  8:30 am for post hospital  Contact information: [phone]        BeQuay BurowMD. Go on 04/18/2017.   Specialties:  Cardiology, Radiology Why:  _0 :20pm for follow up Contact information: [address] [phone]        Discharge Instructions    AMB Referral to Cardiac Rehabilitation - Phase II    Complete by:  As directed    Diagnosis:  STEMI   Amb Referral to Cardiac Rehabilitation  Complete by:  As directed    Diagnosis:   Coronary Stents STEMI PTCA     Diet - low sodium heart healthy    Complete by:  As directed    Discharge instructions    Complete by:  As directed    No driving or return to work until seen in clinic for follow up. No lifting over 10 lbs for 4 weeks. No sexual activity for 4 weeks. Keep procedure site clean & dry. If you notice increased pain, swelling, bleeding or pus, call/return!  You may shower, but no soaking baths/hot tubs/pools for 1 week.   Increase activity slowly    Complete by:  As directed       Discharge Medications   Current Discharge Medication List    START taking these medications   Details  atorvastatin (LIPITOR) 80 MG tablet Take 1 tablet (80 mg total) by mouth daily at 6 PM. Qty: 30 tablet, Refills: 6    metoprolol tartrate (LOPRESSOR) 25 MG tablet Take 0.5 tablets (12.5 mg total) by mouth 2 (two) times daily. Qty: 30 tablet, Refills: 6    pantoprazole  (PROTONIX) 40 MG tablet Take 1 tablet (40 mg total) by mouth daily. Qty: 30 tablet, Refills: 6    ticagrelor (BRILINTA) 90 MG TABS tablet Take 1 tablet (90 mg total) by mouth 2 (two) times daily. Qty: 180 tablet, Refills: 3      CONTINUE these medications which have NOT CHANGED   Details  aspirin EC 81 MG tablet Take 1 tablet (81 mg total) by mouth daily. Qty: 90 tablet, Refills: 3   Associated Diagnoses: Amaurosis fugax    Methylcobalamin (B12-ACTIVE PO) Take 2 tablets by mouth.         Aspirin prescribed at discharge?  Yes High Intensity Statin Prescribed? (Lipitor 40-58m or Crestor 20-434m: Yes Beta Blocker Prescribed? Yes For EF 45% or less, Was ACEI/ARB Prescribed? Unable due to soft BP ADP Receptor Inhibitor Prescribed? (i.e. Plavix etc.-Includes Medically Managed Patients): Yes For EF <40%, Aldosterone Inhibitor Prescribed?  N/A Was EF assessed during THIS hospitalization? Yes Was Cardiac Rehab II ordered? (Included Medically managed Patients): Yes   Outstanding Labs/Studies    Consider OP f/u labs 6-8 weeks given statin initiation this admission.  Duration of Discharge Encounter   Greater than 30 minutes including physician time.  Signed, Bhagat,Bhavinkumar PA-C 02/13/2017, 11:58 AM

## 2017-02-13 NOTE — Plan of Care (Signed)
Problem: Activity: Goal: Ability to tolerate increased activity will improve Outcome: Completed/Met Date Met: 02/13/17 Patient has been able to ambulate throughout the hallway and in his room without chest pain, shortness of breath, or fatigue. Patient has also been working with cardiac rehab.

## 2017-02-13 NOTE — Plan of Care (Signed)
Problem: Cardiac: Goal: Vascular access site(s) Level 0-1 will be maintained Outcome: Completed/Met Date Met: 02/13/17 Patient's right radial vascular site remains a level 0.

## 2017-02-13 NOTE — Progress Notes (Signed)
Walking independently. Ed completed with pt and daughter. Voiced understanding. Will refer to [place]. Understands importance of Brilinta. Gave ex gl and explained NTG.  [phone] Yves Dill CES, ACSM 11:38 AM 02/13/2017

## 2017-02-13 NOTE — Progress Notes (Signed)
Progress Note  Patient Name: John Mccarthy Date of Encounter: 02/13/2017  Primary Cardiologist: Dr. Gwenlyn Mccarthy  Postop day 2  anterior STEMI   Subjective   Feeling well. No chest pain, sob or palpitations. Walking in hallway multiple times without any issue. Wants to go home.   Inpatient Medications    Scheduled Meds: . aspirin  81 mg Oral Daily  . atorvastatin  80 mg Oral q1800  . metoprolol tartrate  12.5 mg Oral BID  . pantoprazole  40 mg Oral Daily  . sodium chloride flush  3 mL Intravenous Q12H  . ticagrelor  90 mg Oral BID   Continuous Infusions:  PRN Meds: sodium chloride, acetaminophen, ALPRAZolam, alum & mag hydroxide-simeth, HYDROcodone-acetaminophen, morphine injection, ondansetron (ZOFRAN) IV, sodium chloride flush   Vital Signs    Vitals:   02/12/17 2049 02/13/17 0036 02/13/17 0320 02/13/17 0700  BP: 97/63 98/62 107/68 (!) 97/57  Pulse: 82 78  77  Resp:  20 18 20   Temp:  98.8 F (37.1 C) 98.8 F (37.1 C) 99.6 F (37.6 C)  TempSrc:  Oral Oral Oral  SpO2:  99% 96% 99%  Weight:   210 lb 4.8 oz (95.4 kg)   Height:        Intake/Output Summary (Last 24 hours) at 02/13/17 1013 Last data filed at 02/13/17 0400  Gross per 24 hour  Intake              920 ml  Output                0 ml  Net              920 ml   Filed Weights   02/11/17 1134 02/13/17 0320  Weight: 213 lb (96.6 kg) 210 lb 4.8 oz (95.4 kg)    Telemetry    NSR at rate of 80s - Personally Reviewed  ECG    None today  Physical Exam   GEN: No acute distress.   Neck: No JVD Cardiac: RRR, no murmurs, rubs, or gallops. Right radial cath site without hematoma Respiratory: Clear to auscultation bilaterally. GI: Soft, nontender, non-distended  MS: No edema; No deformity. Neuro:  Nonfocal  Psych: Normal affect   Labs    Chemistry Recent Labs Lab 02/11/17 1100 02/11/17 1116 02/12/17 0212  NA 142 143 137  K 3.4* 3.4* 3.8  CL 105 104 102  CO2 25  --  25  GLUCOSE 112*  109* 121*  BUN 17 22* 9  CREATININE 1.02 0.90 0.84  CALCIUM 9.0  --  8.6*  GFRNONAA >60  --  >60  GFRAA >60  --  >60  ANIONGAP 12  --  10     Hematology Recent Labs Lab 02/11/17 1100 02/11/17 1116 02/12/17 1008  WBC 10.4  --  14.8*  RBC 5.23  --  4.99  HGB 15.9 16.3 15.2  HCT 47.6 48.0 45.8  MCV 91.0  --  91.8  MCH 30.4  --  30.5  MCHC 33.4  --  33.2  RDW 13.9  --  13.9  PLT 218  --  212    Cardiac Enzymes Recent Labs Lab 02/11/17 1335 02/11/17 1931 02/12/17 0228 02/12/17 1008  TROPONINI 10.85* >65.00* 58.47* 28.41*    Recent Labs Lab 02/11/17 1114  TROPIPOC 0.00     BNPNo results for input(s): BNP, PROBNP in the last 168 hours.   DDimer No results for input(s): DDIMER in the last 168 hours.  Radiology    Dg Chest Port 1 View  Result Date: 02/11/2017 CLINICAL DATA:  LEFT-sided chest pain EXAM: PORTABLE CHEST 1 VIEW COMPARISON:  Radiograph 05/23/2015 FINDINGS: Chronic elevation the RIGHT hemidiaphragm. Normal cardiac silhouette. There is poor definition of the LEFT hemidiaphragm suggesting pleural effusion. IMPRESSION: 1. Chronic elevation of the RIGHT hemidiaphragm. 2. Suspect LEFT pleural effusion. Electronically Signed   By: John Mccarthy M.D.   On: 02/11/2017 15:51    Cardiac Studies   Coronary Stent Intervention    02/11/17  Left Heart Cath and Coronary Angiography  Conclusion     Prox RCA to Mid RCA lesion, 60 %stenosed.  There is moderate left ventricular systolic dysfunction.  LV end diastolic pressure is mildly elevated.  The left ventricular ejection fraction is 45-50% by visual estimate.  Ost LAD to Prox LAD lesion, 100 %stenosed.  Post intervention, there is a 0% residual stenosis.  A stent was successfully placed.  IMPRESSION: Successful proximal LAD PCI and drug-eluting stenting in the setting of an anterior STEMI with a door to balloon time of 21 minutes. His EF was approximately 45% with moderate anteroapical hypokinesia. He  does have a moderate proximal dominant RCA stenosis that does not appear to be physiologically significant and will be treated medically at this time. He will be treated with fiber therapy for at least 1 year as well as beta blocker and high-dose statin therapy.   2-D echocardiogram performed 02/11/17  Impressions:  - Hypokinesis of the septum and apical akinesis; overall mildly reduced LV systolic function; grade 2 diastolic dysfunction; cannot R/O apical thrombus; suggest limited repeat study with definity or cardiac MRI to further assess; mild John; mild LAE.  Patient Profile     John Mccarthy is postop day 2 anterior STEMI with PCI and drug-eluting stenting of the proximal LAD using a synergy drug-eluting stent. This was performed radially. He denies chest pain or shortness of breath. He does have residual moderate proximal dominant RCA stenosis which will be treated medically. He has mild to moderate LV dysfunction with anteroapical wall motion abnormality.  Assessment & Plan    1: Acute anterior STEMI s/p DES to pro LAD -  His EF is in the 45-50% range with anteroapical wall motion abnormality. He does have residual moderate proximal dominant RCA stenosis which will be treated medically at this time. On metoprolol 12.5mg  BID, unable to add ACE or ARB due to soft BP. Consider changing metoprolol to coreg as blood pressure allows.    2: Hyperlipidemia - 02/12/2017: Cholesterol 197; HDL 40; LDL Cholesterol 122; Triglycerides 175; VLDL 35 on high-dose statin therapy. Consider OP f/u labs 6-8 weeks given statin initiation this admission.  Ambulating well. Discharge later today.   Signed, John Kail, PA  02/13/2017, 10:13 AM

## 2017-02-13 NOTE — Care Management (Signed)
1100 02-13-17 Brilinta 30 day free card provided to patient. No further needs from CM at this time. Bethena Roys, RN,BSN [phone]

## 2017-02-14 NOTE — Consult Note (Signed)
           [facility] CM Primary Care Navigator  02/14/2017  John Mccarthy 05/30/1951 [id number]   [place]ent[place] see patient at the bedside earlier today to identify possible discharge needs but staff reports that patient was already discharged.  Patient was discharged home yesterday. Primary care provider's office called ([name]) to notify of patient's discharge and need for post hospital follow-up and transition of care.  Made aware to refer patient to [facility] care management if deemed appropriate for services.  For questions, please contact:  Dannielle Huh, BSN, RN- [facility] Primary Care Navigator  Telephone: [phone] [place]

## 2017-02-17 ENCOUNTER — Telehealth: Payer: Self-pay | Admitting: *Deleted

## 2017-02-17 ENCOUNTER — Telehealth (HOSPITAL_COMMUNITY): Payer: Self-pay | Admitting: Internal Medicine

## 2017-02-17 NOTE — Telephone Encounter (Signed)
Pt was on the TCM list admitted 02/11/17 for  STEMI (ST elevation myocardial infarction) ([place]. Had a Coronary Stent Intervention 02/11/17  Left Heart Cath and Coronary Angiography    Pt was D/C 3/15, and will have hosp f/u w/cardiologist Dr. Rosalyn Gess on 3/27...Johny Chess

## 2017-02-17 NOTE — Telephone Encounter (Signed)
Verified Medicare A/B & BCBS benefits through Passport Reference [phone] [phone].... KJ

## 2017-02-25 ENCOUNTER — Encounter: Payer: Self-pay | Admitting: Cardiology

## 2017-02-25 ENCOUNTER — Ambulatory Visit (INDEPENDENT_AMBULATORY_CARE_PROVIDER_SITE_OTHER): Payer: Medicare Other | Admitting: Cardiology

## 2017-02-25 VITALS — BP 121/78 | HR 66 | Ht 72.0 in | Wt 209.0 lb

## 2017-02-25 DIAGNOSIS — E785 Hyperlipidemia, unspecified: Secondary | ICD-10-CM | POA: Diagnosis not present

## 2017-02-25 DIAGNOSIS — I2102 ST elevation (STEMI) myocardial infarction involving left anterior descending coronary artery: Secondary | ICD-10-CM | POA: Diagnosis not present

## 2017-02-25 DIAGNOSIS — Z9861 Coronary angioplasty status: Secondary | ICD-10-CM | POA: Diagnosis not present

## 2017-02-25 DIAGNOSIS — I2510 Atherosclerotic heart disease of native coronary artery without angina pectoris: Secondary | ICD-10-CM

## 2017-02-25 DIAGNOSIS — I255 Ischemic cardiomyopathy: Secondary | ICD-10-CM | POA: Insufficient documentation

## 2017-02-25 DIAGNOSIS — I2109 ST elevation (STEMI) myocardial infarction involving other coronary artery of anterior wall: Secondary | ICD-10-CM

## 2017-02-25 NOTE — Assessment & Plan Note (Signed)
Acute anterior STEMI after playing tennis- 02/11/17

## 2017-02-25 NOTE — Assessment & Plan Note (Signed)
EF 45-50% at cath and by echo post MI

## 2017-02-25 NOTE — Progress Notes (Signed)
02/25/2017 [name]   [birth year]  [id number]  Primary Physician Scarlette Calico, MD Primary Cardiologist: Dr Gwenlyn Found  HPI:  66 y.o.malewith a FM Hx of CAD, and a history of HLD (not on statin due to myalgias), who presented to [facility] ED 02/11/17 for evaluation of chest pressure after playing tennis and was found to have an anterior STEMI. He was taken to the cath lab by Dr Gwenlyn Found. Cath revealed a total LAD and 60% RCA. His EF was 45-50%. He underwent successful PCI with DES to the LAD. He is in the office today for follow up. He denies chest pain. He has been tolerating statin Rx.    Current Outpatient Prescriptions  Medication Sig Dispense Refill  . aspirin EC 81 MG tablet Take 1 tablet (81 mg total) by mouth daily. 90 tablet 3  . atorvastatin (LIPITOR) 80 MG tablet Take 1 tablet (80 mg total) by mouth daily at 6 PM. 30 tablet 6  . metoprolol tartrate (LOPRESSOR) 25 MG tablet Take 0.5 tablets (12.5 mg total) by mouth 2 (two) times daily. 30 tablet 6  . pantoprazole (PROTONIX) 40 MG tablet Take 1 tablet (40 mg total) by mouth daily. 30 tablet 6  . ticagrelor (BRILINTA) 90 MG TABS tablet Take 1 tablet (90 mg total) by mouth 2 (two) times daily. 180 tablet 3   No current facility-administered medications for this visit.     Allergies  Allergen Reactions  . Crestor [Rosuvastatin Calcium] Other (See Comments)    Muscle aches  . Lactose Intolerance (Gi) Diarrhea    Past Medical History:  Diagnosis Date  . BPH (benign prostatic hyperplasia)   . Hyperlipidemia   . Melanoma ([place])   . Multiple myeloma ([place])   . Pulmonary embolism ([place]) 06/15/2013  . Skin cancer (melanoma) [facility])     Social History   Social History  . Marital status: Married    Spouse name: N/A  . Number of children: N/A  . Years of education: N/A   Occupational History  . Not on file.   Social History Main Topics  . Smoking status: Never Smoker  . Smokeless tobacco: Never Used  . Alcohol use 3.0  oz/week    5 Shots of liquor per week     Comment: occassional  . Drug use: No  . Sexual activity: Yes    Birth control/ protection: None   Other Topics Concern  . Not on file   Social History Narrative  . No narrative on file     Family History  Problem Relation Age of Onset  . Heart failure Father   . Cancer Neg Hx   . COPD Neg Hx   . Stroke Neg Hx   . Kidney disease Neg Hx   . Hypertension Neg Hx   . Hyperlipidemia Neg Hx   . Heart disease Neg Hx   . Hearing loss Neg Hx   . Early death Neg Hx   . Drug abuse Neg Hx   . Diabetes Neg Hx   . Depression Neg Hx      Review of Systems: General: negative for chills, fever, night sweats or weight changes.  Cardiovascular: negative for chest pain, dyspnea on exertion, edema, orthopnea, palpitations, paroxysmal nocturnal dyspnea or shortness of breath Dermatological: negative for rash Respiratory: negative for cough or wheezing Urologic: negative for hematuria Abdominal: negative for nausea, vomiting, diarrhea, bright red blood per rectum, melena, or hematemesis Neurologic: negative for visual changes, syncope, or dizziness All other systems  reviewed and are otherwise negative except as noted above.    Blood pressure 121/78, pulse 66, height 6' (1.829 m), weight 209 lb (94.8 kg).  General appearance: alert, cooperative and no distress Neck: no carotid bruit and no JVD Lungs: clear to auscultation bilaterally Heart: regular rate and rhythm Extremities: no edema Pulses: 2+ and symmetric Skin: Skin color, texture, turgor normal. No rashes or lesions Neurologic: Grossly normal   ASSESSMENT AND PLAN:   Acute ST elevation myocardial infarction (STEMI) involving left anterior descending (LAD) coronary artery (HCC) Acute anterior STEMI after playing tennis- 02/11/17  CAD S/P percutaneous coronary angioplasty S/P urgent LAD PCI with DES 02/11/17. Residual 60% RCA  Ischemic cardiomyopathy EF 45-50% at cath and by echo post  MI  Hyperlipidemia with target LDL less than 130 Previous myalgia with Lipitor and Crestor but tolerating current dose   PLAN  Lipids and CMET in 6-8 weeks then f/u with Dr Gwenlyn Found. I asked him to avoid any strenuous activity for the next 2-3 weeks.  Kerin Ransom PA-C 02/25/2017 8:35 AM

## 2017-02-25 NOTE — Assessment & Plan Note (Signed)
Previous myalgia with Lipitor and Crestor but tolerating current dose

## 2017-02-25 NOTE — Patient Instructions (Signed)
Medication Instructions:  Your physician recommends that you continue on your current medications as directed. Please refer to the Current Medication list given to you today.  Labwork: Your physician recommends that you return for lab work in: [place] 3 MONTHS   Testing/Procedures: NONE   Follow-Up: Your physician recommends that you schedule a follow-up appointment in: 3 MONTHS WITH DR Gwenlyn Found   Any Other Special Instructions Will Be Listed Below (If Applicable).  If you need a refill on your cardiac medications before your next appointment, please call your pharmacy.

## 2017-02-25 NOTE — Assessment & Plan Note (Signed)
S/P urgent LAD PCI with DES 02/11/17. Residual 60% RCA

## 2017-03-12 ENCOUNTER — Telehealth (HOSPITAL_COMMUNITY): Payer: Self-pay | Admitting: Internal Medicine

## 2017-03-12 ENCOUNTER — Other Ambulatory Visit: Payer: Self-pay | Admitting: Physician Assistant

## 2017-03-12 NOTE — Telephone Encounter (Signed)
Rx(s) sent to pharmacy electronically.  

## 2017-03-12 NOTE — Telephone Encounter (Signed)
I called and spoke to patient, patient scheduled for orientation and cardiac rehab classes.

## 2017-04-07 ENCOUNTER — Telehealth (HOSPITAL_COMMUNITY): Payer: Self-pay | Admitting: Internal Medicine

## 2017-04-07 NOTE — Telephone Encounter (Signed)
*  Updated insurance verification* Patient has Medicare A/B - primary and BCBS medicare supplement - secondary. Medicare A/B - no co-payment, deductible $183.00/$183.00 has been met, no out of pocket, 20% co-insurance, no pre-authorization and no limit on visit. Passport/reference [phone]. BCBS medicare supplement - no co-payment, no deductible, no out of pocket, no co-insurance, no pre-authorization and no limit on visit. Passport/reference [phone].

## 2017-04-08 ENCOUNTER — Encounter (HOSPITAL_COMMUNITY): Payer: Self-pay

## 2017-04-08 ENCOUNTER — Telehealth: Payer: Self-pay

## 2017-04-08 ENCOUNTER — Encounter (HOSPITAL_COMMUNITY)
Admission: RE | Admit: 2017-04-08 | Discharge: 2017-04-08 | Disposition: A | Discharge status: Home / Self Care | Payer: Medicare Other | Source: Ambulatory Visit | Attending: Cardiovascular Disease | Admitting: Cardiovascular Disease

## 2017-04-08 VITALS — BP 118/62 | HR 71 | Ht 71.0 in | Wt 208.6 lb

## 2017-04-08 DIAGNOSIS — I2121 ST elevation (STEMI) myocardial infarction involving left circumflex coronary artery: Secondary | ICD-10-CM | POA: Insufficient documentation

## 2017-04-08 DIAGNOSIS — Z955 Presence of coronary angioplasty implant and graft: Secondary | ICD-10-CM | POA: Diagnosis not present

## 2017-04-08 DIAGNOSIS — I2102 ST elevation (STEMI) myocardial infarction involving left anterior descending coronary artery: Secondary | ICD-10-CM

## 2017-04-08 NOTE — Progress Notes (Signed)
Cardiac Individual Treatment Plan  Patient Details  Name: John Mccarthy MRN: [id number] Date of Birth: November 09, 1951 Referring Provider:     [place] from 04/08/2017 in [facility]  Referring Provider  Quay Burow MD      Initial Encounter Date:    CARDIAC REHAB PHASE II ORIENTATION from 04/08/2017 in [place]  Date  04/08/17  Referring Provider  Quay Burow MD      Visit Diagnosis: 02/11/17 ST elevation myocardial infarction involving left anterior descending (LAD) coronary artery ([place])  Patient's Home Medications on Admission:  Current Outpatient Prescriptions:  .  aspirin EC 81 MG tablet, Take 1 tablet (81 mg total) by mouth daily., Disp: 90 tablet, Rfl: 3 .  atorvastatin (LIPITOR) 80 MG tablet, Take 1 tablet (80 mg total) by mouth daily at 6 PM., Disp: 30 tablet, Rfl: 6 .  metoprolol tartrate (LOPRESSOR) 25 MG tablet, Take 0.5 tablets (12.5 mg total) by mouth 2 (two) times daily., Disp: 30 tablet, Rfl: 6 .  pantoprazole (PROTONIX) 40 MG tablet, Take 1 tablet (40 mg total) by mouth daily., Disp: 30 tablet, Rfl: 6 .  ticagrelor (BRILINTA) 90 MG TABS tablet, Take 1 tablet (90 mg total) by mouth 2 (two) times daily., Disp: 60 tablet, Rfl: 11  Past Medical History: Past Medical History:  Diagnosis Date  . BPH (benign prostatic hyperplasia)   . Hyperlipidemia   . Melanoma ([place])   . Multiple myeloma ([place])   . Pulmonary embolism ([place]) 06/15/2013  . Skin cancer (melanoma) ([facility])     Tobacco Use: History  Smoking Status  . Never Smoker  Smokeless Tobacco  . Never Used    Labs: Recent Review Flowsheet Data    Labs for ITP Cardiac and Pulmonary Rehab Latest Ref Rng & Units 05/23/2015 05/24/2015 10/15/2016 02/11/2017 02/12/2017   Cholestrol 0 - 200 mg/dL - 185 210(H) - 197   LDLCALC 0 - 99 mg/dL - 112(H) - - 122(H)   LDLDIRECT mg/dL - - 146.0 - -   HDL >40 mg/dL - 32(L) 41.00 -  40(L)   Trlycerides <150 mg/dL - 207(H) 219.0(H) - 175(H)   Hemoglobin A1c 4.6 - 6.5 % 5.8(H) 5.8(H) 5.5 - -   TCO2 0 - 100 mmol/L - - - 25 -      Capillary Blood Glucose: Lab Results  Component Value Date   GLUCAP 94 05/23/2015   GLUCAP 100 (H) 09/25/2012     Exercise Target Goals: Date: 04/08/17  Exercise Program Goal: Individual exercise prescription set with THRR, safety & activity barriers. Participant demonstrates ability to understand and report RPE using BORG scale, to self-measure pulse accurately, and to acknowledge the importance of the exercise prescription.  Exercise Prescription Goal: Starting with aerobic activity 30 plus minutes a day, 3 days per week for initial exercise prescription. Provide home exercise prescription and guidelines that participant acknowledges understanding prior to discharge.  Activity Barriers & Risk Stratification:     Activity Barriers & Cardiac Risk Stratification - 04/08/17 1346      Activity Barriers & Cardiac Risk Stratification   Activity Barriers Other (comment)   Comments R achilles tendon repair   Cardiac Risk Stratification High      6 Minute Walk:     6 Minute Walk    Row Name 04/08/17 1431         6 Minute Walk   Phase Initial     Distance 1708 feet  Walk Time 6 minutes     # of Rest Breaks 0     MPH 3.23     METS 3.7     RPE 7     VO2 Peak 12.96     Symptoms No     Resting HR 71 bpm     Resting BP 118/62     Max Ex. HR 102 bpm     Max Ex. BP 124/70     2 Minute Post BP 118/72        Oxygen Initial Assessment:   Oxygen Re-Evaluation:   Oxygen Discharge (Final Oxygen Re-Evaluation):   Initial Exercise Prescription:     Initial Exercise Prescription - 04/08/17 1400      Date of Initial Exercise RX and Referring Provider   Date 04/08/17   Referring Provider Quay Burow MD     Treadmill   MPH 2.7   Grade 1   Minutes 10   METs 3.44     Bike   Level 1   Minutes 10   METs 2.99      NuStep   Level 3   SPM 80   Minutes 10   METs 2     Prescription Details   Frequency (times per week) 3   Duration Progress to 30 minutes of continuous aerobic without signs/symptoms of physical distress     Intensity   THRR 40-80% of Max Heartrate 61-122   Ratings of Perceived Exertion 11-13   Perceived Dyspnea 0-4     Progression   Progression Continue to progress workloads to maintain intensity without signs/symptoms of physical distress.     Resistance Training   Training Prescription Yes   Weight 3lbs   Reps 10-15      Perform Capillary Blood Glucose checks as needed.  Exercise Prescription Changes:   Exercise Comments:   Exercise Goals and Review:     Exercise Goals    Row Name 04/08/17 1350 04/08/17 1435 04/08/17 1520         Exercise Goals   Increase Physical Activity Yes  -  -     Intervention Develop an individualized exercise prescription for aerobic and resistive training based on initial evaluation findings, risk stratification, comorbidities and participant's personal goals.;Provide advice, education, support and counseling about physical activity/exercise needs.  -  -     Expected Outcomes Achievement of increased cardiorespiratory fitness and enhanced flexibility, muscular endurance and strength shown through measurements of functional capacity and personal statement of participant.  -  -     Increase Strength and Stamina Yes -  Learn how to lift weights safely Yes     Intervention Provide advice, education, support and counseling about physical activity/exercise needs.;Develop an individualized exercise prescription for aerobic and resistive training based on initial evaluation findings, risk stratification, comorbidities and participant's personal goals.  Learn exercise and activity limitations and be able to lift weights safely  -  -     Expected Outcomes Achievement of increased cardiorespiratory fitness and enhanced flexibility, muscular  endurance and strength shown through measurements of functional capacity and personal statement of participant.  -  -        Exercise Goals Re-Evaluation :    Discharge Exercise Prescription (Final Exercise Prescription Changes):   Nutrition:  Target Goals: Understanding of nutrition guidelines, daily intake of sodium '1500mg'$ , cholesterol '200mg'$ , calories 30% from fat and 7% or less from saturated fats, daily to have 5 or more servings of fruits and vegetables.  Biometrics:  Pre Biometrics - 04/08/17 1436      Pre Biometrics   Waist Circumference 41.5 inches   Hip Circumference 41 inches   Waist to Hip Ratio 1.01 %   Triceps Skinfold 23 mm   % Body Fat 30.2 %   Grip Strength 39 kg   Flexibility 7.75 in   Single Leg Stand 30 seconds       Nutrition Therapy Plan and Nutrition Goals:   Nutrition Discharge: Nutrition Scores:   Nutrition Goals Re-Evaluation:   Nutrition Goals Re-Evaluation:   Nutrition Goals Discharge (Final Nutrition Goals Re-Evaluation):   Psychosocial: Target Goals: Acknowledge presence or absence of significant depression and/or stress, maximize coping skills, provide positive support system. Participant is able to verbalize types and ability to use techniques and skills needed for reducing stress and depression.  Initial Review & Psychosocial Screening:     Initial Psych Review & Screening - 04/08/17 [place]? Yes     Barriers   Psychosocial barriers to participate in program There are no identifiable barriers or psychosocial needs.     Screening Interventions   Interventions Encouraged to exercise      Quality of Life Scores:     Quality of Life - 04/08/17 1510      Quality of Life Scores   Health/Function Pre 27.6 %   Socioeconomic Pre 27.21 %   Psych/Spiritual Pre 27 %   Family Pre 28.8 %   GLOBAL Pre 27.59 %      PHQ-9: Recent Review Flowsheet Data    Depression screen [facility]  2/9 02/13/2015 10/18/2013   Decreased Interest 0 0   Down, Depressed, Hopeless 0 0   PHQ - 2 Score 0 0     Interpretation of Total Score  Total Score Depression Severity:  1-4 = Minimal depression, 5-9 = Mild depression, 10-14 = Moderate depression, 15-19 = Moderately severe depression, 20-27 = Severe depression   Psychosocial Evaluation and Intervention:   Psychosocial Re-Evaluation:   Psychosocial Discharge (Final Psychosocial Re-Evaluation):   Vocational Rehabilitation: Provide vocational rehab assistance to qualifying candidates.   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      Education: Education Goals: Education classes will be provided on a weekly basis, covering required topics. Participant will state understanding/return demonstration of topics presented.  Learning Barriers/Preferences:     Learning Barriers/Preferences - 04/08/17 1346      Learning Barriers/Preferences   Learning Barriers Sight   Learning Preferences Skilled Demonstration      Education Topics: Count Your Pulse:  -Group instruction provided by verbal instruction, demonstration, patient participation and written materials to support subject.  Instructors address importance of being able to find your pulse and how to count your pulse when at home without a heart monitor.  Patients get hands on experience counting their pulse with staff help and individually.   Heart Attack, Angina, and Risk Factor Modification:  -Group instruction provided by verbal instruction, video, and written materials to support subject.  Instructors address signs and symptoms of angina and heart attacks.    Also discuss risk factors for heart disease and how to make changes to  improve heart health risk factors.   Functional Fitness:  -  Group instruction provided by verbal instruction, demonstration, patient participation, and written materials to support subject.  Instructors address safety measures for doing things around the house.  Discuss how to get up and down off the floor, how to pick things up properly, how to safely get out of a chair without assistance, and balance training.   Meditation and Mindfulness:  -Group instruction provided by verbal instruction, patient participation, and written materials to support subject.  Instructor addresses importance of mindfulness and meditation practice to help reduce stress and improve awareness.  Instructor also leads participants through a meditation exercise.    Stretching for Flexibility and Mobility:  -Group instruction provided by verbal instruction, patient participation, and written materials to support subject.  Instructors lead participants through series of stretches that are designed to increase flexibility thus improving mobility.  These stretches are additional exercise for major muscle groups that are typically performed during regular warm up and cool down.   Hands Only CPR Anytime:  -Group instruction provided by verbal instruction, video, patient participation and written materials to support subject.  Instructors co-teach with AHA video for hands only CPR.  Participants get hands on experience with mannequins.   Nutrition I class: Heart Healthy Eating:  -Group instruction provided by PowerPoint slides, verbal discussion, and written materials to support subject matter. The instructor gives an explanation and review of the Therapeutic Lifestyle Changes diet recommendations, which includes a discussion on lipid goals, dietary fat, sodium, fiber, plant stanol/sterol esters, sugar, and the components of a well-balanced, healthy diet.   Nutrition II class: Lifestyle Skills:  -Group instruction provided by  PowerPoint slides, verbal discussion, and written materials to support subject matter. The instructor gives an explanation and review of label reading, grocery shopping for heart health, heart healthy recipe modifications, and ways to make healthier choices when eating out.   Diabetes Question & Answer:  -Group instruction provided by PowerPoint slides, verbal discussion, and written materials to support subject matter. The instructor gives an explanation and review of diabetes co-morbidities, pre- and post-prandial blood glucose goals, pre-exercise blood glucose goals, signs, symptoms, and treatment of hypoglycemia and hyperglycemia, and foot care basics.   Diabetes Blitz:  -Group instruction provided by PowerPoint slides, verbal discussion, and written materials to support subject matter. The instructor gives an explanation and review of the physiology behind type 1 and type 2 diabetes, diabetes medications and rational behind using different medications, pre- and post-prandial blood glucose recommendations and Hemoglobin A1c goals, diabetes diet, and exercise including blood glucose guidelines for exercising safely.    Portion Distortion:  -Group instruction provided by PowerPoint slides, verbal discussion, written materials, and food models to support subject matter. The instructor gives an explanation of serving size versus portion size, changes in portions sizes over the last 20 years, and what consists of a serving from each food group.   Stress Management:  -Group instruction provided by verbal instruction, video, and written materials to support subject matter.  Instructors review role of stress in heart disease and how to cope with stress positively.     Exercising on Your Own:  -Group instruction provided by verbal instruction, power point, and written materials to support subject.  Instructors discuss benefits of exercise, components of exercise, frequency and intensity of exercise,  and end points for exercise.  Also discuss use of nitroglycerin and activating EMS.  Review options of places to exercise outside of rehab.  Review guidelines for sex with heart disease.   Cardiac Drugs I:  -Group  instruction provided by verbal instruction and written materials to support subject.  Instructor reviews cardiac drug classes: antiplatelets, anticoagulants, beta blockers, and statins.  Instructor discusses reasons, side effects, and lifestyle considerations for each drug class.   Cardiac Drugs II:  -Group instruction provided by verbal instruction and written materials to support subject.  Instructor reviews cardiac drug classes: angiotensin converting enzyme inhibitors (ACE-I), angiotensin II receptor blockers (ARBs), nitrates, and calcium channel blockers.  Instructor discusses reasons, side effects, and lifestyle considerations for each drug class.   Anatomy and Physiology of the Circulatory System:  -Group instruction provided by verbal instruction, video, and written materials to support subject.  Reviews functional anatomy of heart, how it relates to various diagnoses, and what role the heart plays in the overall system.   Knowledge Questionnaire Score:     Knowledge Questionnaire Score - 04/08/17 1341      Knowledge Questionnaire Score   Pre Score 21/24      Core Components/Risk Factors/Patient Goals at Admission:     Personal Goals and Risk Factors at Admission - 04/08/17 1436      Core Components/Risk Factors/Patient Goals on Admission   Lipids Yes   Intervention Provide education and support for participant on nutrition & aerobic/resistive exercise along with prescribed medications to achieve LDL <45m, HDL >452m   Expected Outcomes Short Term: Participant states understanding of desired cholesterol values and is compliant with medications prescribed. Participant is following exercise prescription and nutrition guidelines.;Long Term: Cholesterol controlled with  medications as prescribed, with individualized exercise RX and with personalized nutrition plan. Value goals: LDL < 7071mHDL > 40 mg.      Core Components/Risk Factors/Patient Goals Review:    Core Components/Risk Factors/Patient Goals at Discharge (Final Review):    ITP Comments:     ITP Comments    Row Name 04/08/17 1341           ITP Comments Medical Director, Dr. TraFransico Him        Comments: Zakariyya attended orientation from 1330 to 1445 to review rules and guidelines for program. Completed 6 minute walk test, Intitial ITP, and exercise prescription.  VSS. Telemetry-Sinus Rhythm.  Asymptomatic.MarBarnet PallN,BSN 04/08/2017 4:51 PM

## 2017-04-08 NOTE — Telephone Encounter (Signed)
John Mccarthy from [place] called and states that pt saw luke, PA_-C 02-25-2017 pt had STEMI and was discharged 02-13-2017 without SL-nitro she states that they usually are on the Discharge medication and states that she thinks that pt need Nitro, she states that pt is not currently having chest pai but would like pt to have nitro just in case he needs it. Ok to rx SL-nitro? Please advise

## 2017-04-14 ENCOUNTER — Encounter (HOSPITAL_COMMUNITY)
Admission: RE | Admit: 2017-04-14 | Discharge: 2017-04-14 | Disposition: A | Discharge status: Home / Self Care | Payer: Medicare Other | Source: Ambulatory Visit | Attending: Cardiovascular Disease | Admitting: Cardiovascular Disease

## 2017-04-14 DIAGNOSIS — I2121 ST elevation (STEMI) myocardial infarction involving left circumflex coronary artery: Secondary | ICD-10-CM | POA: Diagnosis not present

## 2017-04-14 DIAGNOSIS — Z955 Presence of coronary angioplasty implant and graft: Secondary | ICD-10-CM | POA: Diagnosis not present

## 2017-04-14 DIAGNOSIS — I2102 ST elevation (STEMI) myocardial infarction involving left anterior descending coronary artery: Secondary | ICD-10-CM

## 2017-04-14 MED ORDER — NITROGLYCERIN 0.4 MG SL SUBL: 0.4000 mg | SUBLINGUAL_TABLET | SUBLINGUAL | 3 refills | Status: DC | PRN

## 2017-04-14 NOTE — Progress Notes (Signed)
Daily Session Note  Patient Details  Name: John Mccarthy MRN: [id number] Date of Birth: 07-06-1951 Referring Provider:     CARDIAC REHAB PHASE II ORIENTATION from 04/08/2017 in [place]  Referring Provider  Quay Burow MD      Encounter Date: 04/14/2017  Check In:     Session Check In - 04/14/17 1038      Check-In   Location [facility]   Staff Present Cleda Mccreedy, MS, Exercise Physiologist;Joan Leonia Reeves, RN, Marga Melnick, RN, BSN;Joann Rion, RN, BSN   Supervising physician immediately available to respond to emergencies [facility] immediately available   Physician(s) Dr. Wendee Beavers    Medication changes reported     No   Fall or balance concerns reported    No   Tobacco Cessation No Change   Warm-up and Cool-down Performed as group-led instruction   Resistance Training Performed Yes   VAD Patient? No     Pain Assessment   Currently in Pain? No/denies      Capillary Blood Glucose: No results found for this or any previous visit (from the past 24 hour(s)).    History  Smoking Status  . Never Smoker  Smokeless Tobacco  . Never Used    Goals Met:  Exercise tolerated well  Goals Unmet:  Not Applicable  Comments: [name] started cardiac rehab today.  Pt tolerated light exercise without difficulty. VSS, telemetry-Sinus Rhythm, asymptomatic.  Medication list reconciled. Pt denies barriers to medicaiton compliance.  PSYCHOSOCIAL ASSESSMENT:  PHQ-0. Pt exhibits positive coping skills, hopeful outlook with supportive family. No psychosocial needs identified at this time, no psychosocial interventions necessary.    Pt enjoys playing tennis, car shows and working on cars.   Pt oriented to exercise equipment and routine.    Understanding verbalized. [name], RN,BSN 04/14/2017 11:07 AM   Dr. Fransico Him is Medical Director for Cardiac Rehab at [facility].

## 2017-04-14 NOTE — Telephone Encounter (Signed)
Yes. Should have a script for SL NTG

## 2017-04-14 NOTE — Addendum Note (Signed)
Addended by: Waylan Rocher on: 04/14/2017 03:28 PM   Modules accepted: Orders

## 2017-04-16 ENCOUNTER — Telehealth: Payer: Self-pay | Admitting: *Deleted

## 2017-04-16 ENCOUNTER — Encounter (HOSPITAL_COMMUNITY)
Admission: RE | Admit: 2017-04-16 | Discharge: 2017-04-16 | Disposition: A | Discharge status: Home / Self Care | Payer: Medicare Other | Source: Ambulatory Visit | Attending: Cardiovascular Disease | Admitting: Cardiovascular Disease

## 2017-04-16 DIAGNOSIS — I2102 ST elevation (STEMI) myocardial infarction involving left anterior descending coronary artery: Secondary | ICD-10-CM

## 2017-04-16 DIAGNOSIS — R748 Abnormal levels of other serum enzymes: Secondary | ICD-10-CM

## 2017-04-16 DIAGNOSIS — E785 Hyperlipidemia, unspecified: Secondary | ICD-10-CM | POA: Diagnosis not present

## 2017-04-16 DIAGNOSIS — Z955 Presence of coronary angioplasty implant and graft: Secondary | ICD-10-CM | POA: Diagnosis not present

## 2017-04-16 DIAGNOSIS — I2121 ST elevation (STEMI) myocardial infarction involving left circumflex coronary artery: Secondary | ICD-10-CM | POA: Diagnosis not present

## 2017-04-16 LAB — COMPLETE METABOLIC PANEL WITH GFR
ALT: 117 U/L — ABNORMAL HIGH (ref 9–46)
AST: 75 U/L — ABNORMAL HIGH (ref 10–35)
Albumin: 3.9 g/dL (ref 3.6–5.1)
Alkaline Phosphatase: 124 U/L — ABNORMAL HIGH (ref 40–115)
BUN: 10 mg/dL (ref 7–25)
CO2: 27 mmol/L (ref 20–31)
Calcium: 9 mg/dL (ref 8.6–10.3)
Chloride: 105 mmol/L (ref 98–110)
Creat: 0.95 mg/dL (ref 0.70–1.25)
GFR, Est African American: >89 mL/min (ref >=60)
GFR, Est Non African American: 82 mL/min (ref >=60)
Glucose, Bld: 101 mg/dL — ABNORMAL HIGH (ref 65–99)
Potassium: 4.5 mmol/L (ref 3.5–5.3)
Sodium: 140 mmol/L (ref 135–146)
Total Bilirubin: 1.3 mg/dL — ABNORMAL HIGH (ref 0.2–1.2)
Total Protein: 6.8 g/dL (ref 6.1–8.1)

## 2017-04-16 LAB — LIPID PANEL
Cholesterol: 119 mg/dL (ref <200)
HDL: 33 mg/dL — ABNORMAL LOW (ref >40)
LDL Cholesterol: 59 mg/dL (ref <100)
Total CHOL/HDL Ratio: 3.6 Ratio (ref <5.0)
Triglycerides: 137 mg/dL (ref <150)
VLDL: 27 mg/dL (ref <30)

## 2017-04-16 NOTE — Telephone Encounter (Signed)
Left msg to call.

## 2017-04-16 NOTE — Telephone Encounter (Signed)
-----   Message from Erlene Quan, [place] sent at 04/16/2017  4:10 PM EDT ----- Lipids looked good. LFTS slightly elevated- repeat LFTs in 6 weeks, if still elevated decrease statin by half and check LFTS and lipids again in 6 weeks.  Kerin Ransom PA-C 04/16/2017 4:10 PM

## 2017-04-17 NOTE — Telephone Encounter (Signed)
I've spoken to patient and communicated recommendations. Liver function test orders entered. Pt aware to go for repeat labwork in 6 weeks. He'll call should he have any further questions.  He has appt w Dr. Gwenlyn Found tomorrow and I confirmed this on the phone with him.

## 2017-04-17 NOTE — Telephone Encounter (Signed)
New message     Pt is returning Ovid Curd call for results

## 2017-04-18 ENCOUNTER — Encounter: Payer: Self-pay | Admitting: Cardiovascular Disease

## 2017-04-18 ENCOUNTER — Encounter (HOSPITAL_COMMUNITY)
Admission: RE | Admit: 2017-04-18 | Discharge: 2017-04-18 | Disposition: A | Discharge status: Home / Self Care | Payer: Medicare Other | Source: Ambulatory Visit | Attending: Cardiovascular Disease | Admitting: Cardiovascular Disease

## 2017-04-18 ENCOUNTER — Ambulatory Visit (INDEPENDENT_AMBULATORY_CARE_PROVIDER_SITE_OTHER): Payer: Medicare Other | Admitting: Cardiovascular Disease

## 2017-04-18 VITALS — BP 122/87 | HR 65 | Ht 72.0 in | Wt 206.6 lb

## 2017-04-18 DIAGNOSIS — I255 Ischemic cardiomyopathy: Secondary | ICD-10-CM

## 2017-04-18 DIAGNOSIS — I2510 Atherosclerotic heart disease of native coronary artery without angina pectoris: Secondary | ICD-10-CM | POA: Diagnosis not present

## 2017-04-18 DIAGNOSIS — I2121 ST elevation (STEMI) myocardial infarction involving left circumflex coronary artery: Secondary | ICD-10-CM | POA: Diagnosis not present

## 2017-04-18 DIAGNOSIS — I2102 ST elevation (STEMI) myocardial infarction involving left anterior descending coronary artery: Secondary | ICD-10-CM

## 2017-04-18 DIAGNOSIS — I2109 ST elevation (STEMI) myocardial infarction involving other coronary artery of anterior wall: Secondary | ICD-10-CM | POA: Diagnosis not present

## 2017-04-18 DIAGNOSIS — Z955 Presence of coronary angioplasty implant and graft: Secondary | ICD-10-CM | POA: Diagnosis not present

## 2017-04-18 DIAGNOSIS — E785 Hyperlipidemia, unspecified: Secondary | ICD-10-CM | POA: Diagnosis not present

## 2017-04-18 DIAGNOSIS — Z9861 Coronary angioplasty status: Secondary | ICD-10-CM | POA: Diagnosis not present

## 2017-04-18 MED ORDER — ATORVASTATIN CALCIUM 40 MG PO TABS: 40.0000 mg | ORAL_TABLET | Freq: Every day | ORAL | 6 refills | Status: DC

## 2017-04-18 NOTE — Patient Instructions (Addendum)
Medication Instructions: Your physician recommends that you continue on your current medications as directed. Please refer to the Current Medication list given to you today.  Decrease Atorvastatin to 40 mg daily.  Labwork: Your physician recommends that you return for a FASTING lipid profile and hepatic function panel in 2 months.   Testing/Procedures: Your physician has requested that you have an echocardiogram in 2 months. Echocardiography is a painless test that uses sound waves to create images of your heart. It provides your doctor with information about the size and shape of your heart and how well your heart's chambers and valves are working. This procedure takes approximately one hour. There are no restrictions for this procedure.  Follow-Up: We request that you follow-up in: 6 months with Kerin Ransom, PA-C and in 12 months with Dr Andria Rhein will receive a reminder letter in the mail two months in advance. If you don't receive a letter, please call our office to schedule the follow-up appointment.  If you need a refill on your cardiac medications before your next appointment, please call your pharmacy.

## 2017-04-18 NOTE — Assessment & Plan Note (Signed)
History of CAD status post anterior STEMI 02/11/17 secondary to occluded ostial LAD which I stented with a 3 mm x 12 mm long synergy drug-eluting stent postdilated with a 3.25 mm balloon (3.3 mm) with a door to balloon time 21 minutes. His EF was 45% by 2-D echo the same day. He remains on dual antibiotic therapy with aspirin and Brilenta. He is back playing tennis without limitation. He will remain on dual antibiotic therapy for 12 months and I will transition him to Plavix after that. We'll repeat a 2-D echo in several months to assess improvement in LV function.

## 2017-04-18 NOTE — Assessment & Plan Note (Signed)
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 Appropriate medications. We'll we will recheck a 2-D echo in 2 months.

## 2017-04-18 NOTE — Assessment & Plan Note (Signed)
History of hyperlipidemia high-dose statin therapy with recent lipid profile performed 04/16/17 revealing total cholesterol 119, LDL 59 HDL 33. Of note, his ALTs was 117. Based on this I'm going to decrease his Lipitor from 80-40 mg a day and will recheck a lipid liver profile in 2 months. If he has continued elevated LFTs we may consider PC SK9 monoclonal

## 2017-04-18 NOTE — Progress Notes (Signed)
04/18/2017 [name]   May 27, 1951  [id number]  Primary Physician Janith Lima, MD Primary Cardiologist: Lorretta Harp MD Renae Gloss  HPI:  Mr Vonbehren is a delightful 66 year old mildly overweight married Caucasian male (revised (today) father of 2 grandfather for grandchildren is retired as a Conservator, museum/gallery for [employer]. He is here for post hospital follow-up after being seen immediately postop by Kerin Ransom PAC. He has a history of hyperlipidemia, statin intolerant in the past. He had anterior STEMI while think the best tenderness on 02/11/17. He is brought to the cath lab emergently and radial cath by myself revealing an ostial LAD occlusion which I stented with a synergy drug-eluting stent. He did have a 60% proximal to mid dominant RCA stenosis which I elected to treat medically. EF was 50% with anteroapical wall motion abnormality. He is on dual therapy including aspirin Brilenta. He is asymptomatic back playing tennis.   Current Outpatient Prescriptions  Medication Sig Dispense Refill  . aspirin EC 81 MG tablet Take 1 tablet (81 mg total) by mouth daily. 90 tablet 3  . atorvastatin (LIPITOR) 40 MG tablet Take 1 tablet (40 mg total) by mouth daily at 6 PM. 30 tablet 6  . metoprolol tartrate (LOPRESSOR) 25 MG tablet Take 0.5 tablets (12.5 mg total) by mouth 2 (two) times daily. 30 tablet 6  . nitroGLYCERIN (NITROSTAT) 0.4 MG SL tablet Place 1 tablet (0.4 mg total) under the tongue every 5 (five) minutes as needed for chest pain. 25 tablet 3  . pantoprazole (PROTONIX) 40 MG tablet Take 1 tablet (40 mg total) by mouth daily. 30 tablet 6  . ticagrelor (BRILINTA) 90 MG TABS tablet Take 1 tablet (90 mg total) by mouth 2 (two) times daily. 60 tablet 11   No current facility-administered medications for this visit.     Allergies  Allergen Reactions  . Crestor [Rosuvastatin Calcium] Other (See Comments)    Muscle aches  . Lactose Intolerance  (Gi) Diarrhea    Social History   Social History  . Marital status: Married    Spouse name: N/A  . Number of children: N/A  . Years of education: N/A   Occupational History  . Not on file.   Social History Main Topics  . Smoking status: Never Smoker  . Smokeless tobacco: Never Used  . Alcohol use 3.0 oz/week    5 Shots of liquor per week     Comment: occassional  . Drug use: No  . Sexual activity: Yes    Birth control/ protection: None   Other Topics Concern  . Not on file   Social History Narrative  . No narrative on file     Review of Systems: General: negative for chills, fever, night sweats or weight changes.  Cardiovascular: negative for chest pain, dyspnea on exertion, edema, orthopnea, palpitations, paroxysmal nocturnal dyspnea or shortness of breath Dermatological: negative for rash Respiratory: negative for cough or wheezing Urologic: negative for hematuria Abdominal: negative for nausea, vomiting, diarrhea, bright red blood per rectum, melena, or hematemesis Neurologic: negative for visual changes, syncope, or dizziness All other systems reviewed and are otherwise negative except as noted above.    Blood pressure 122/87, pulse 65, height 6' (1.829 m), weight 206 lb 9.6 oz (93.7 kg), SpO2 97 %.  General appearance: alert and no distress Neck: no adenopathy, no carotid bruit, no JVD, supple, symmetrical, trachea midline and thyroid not enlarged, symmetric, no tenderness/mass/nodules Lungs: clear to auscultation  bilaterally Heart: regular rate and rhythm, S1, S2 normal, no murmur, click, rub or gallop Extremities: extremities normal, atraumatic, no cyanosis or edema  EKG not performed today  ASSESSMENT AND PLAN:   Hyperlipidemia with target LDL less than 130 History of hyperlipidemia high-dose statin therapy with recent lipid profile performed 04/16/17 revealing total cholesterol 119, LDL 59 HDL 33. Of note, his ALTs was 117. Based on this I'm going to  decrease his Lipitor from 80-40 mg a day and will recheck a lipid liver profile in 2 months. If he has continued elevated LFTs we may consider PC SK9 monoclonal  Acute ST elevation myocardial infarction (STEMI) involving left anterior descending (LAD) coronary artery (HCC) History of CAD status post anterior STEMI 02/11/17 secondary to occluded ostial LAD which I stented with a 3 mm x 12 mm long synergy drug-eluting stent postdilated with a 3.25 mm balloon (3.3 mm) with a door to balloon time 21 minutes. His EF was 45% by 2-D echo the same day. He remains on dual antibiotic therapy with aspirin and Brilenta. He is back playing tennis without limitation. He will remain on dual antibiotic therapy for 12 months and I will transition him to Plavix after that. We'll repeat a 2-D echo in several months to assess improvement in LV function.  Ischemic cardiomyopathy Ischemic cardiomyopathy secondary to anterior STEMI with an EF of 45-50% with an anteroapical wall motion of the malady. Appropriate medications. We'll we will recheck a 2-D echo in 2 months.      Lorretta Harp MD FACP,FACC,FAHA, [facility] 04/18/2017 3:14 PM

## 2017-04-21 ENCOUNTER — Encounter (HOSPITAL_COMMUNITY)
Admission: RE | Admit: 2017-04-21 | Discharge: 2017-04-21 | Disposition: A | Discharge status: Home / Self Care | Payer: Medicare Other | Source: Ambulatory Visit | Attending: Cardiovascular Disease | Admitting: Cardiovascular Disease

## 2017-04-21 DIAGNOSIS — Z955 Presence of coronary angioplasty implant and graft: Secondary | ICD-10-CM | POA: Diagnosis not present

## 2017-04-21 DIAGNOSIS — I2102 ST elevation (STEMI) myocardial infarction involving left anterior descending coronary artery: Secondary | ICD-10-CM

## 2017-04-21 DIAGNOSIS — I2121 ST elevation (STEMI) myocardial infarction involving left circumflex coronary artery: Secondary | ICD-10-CM | POA: Diagnosis not present

## 2017-04-21 NOTE — Progress Notes (Signed)
Reviewed home exercise program with pt.  Discussed mode/frequency of exercise, target heart rate range, rate of perceived exertion scale and weather conditions for exercising outdoors.  Also, discussed signs and symptoms and when to call Dr./911.  Pt verbalized understanding.  Dessiree Sze, MS ACSM RCEP 04/21/2017 17:08 

## 2017-04-22 NOTE — Progress Notes (Signed)
Cardiac Individual Treatment Plan  Patient Details  Name: John Mccarthy MRN: [id number] Date of Birth: 03/09/1951 Referring Provider:     CARDIAC REHAB PHASE II ORIENTATION from 04/08/2017 in [facility]  Referring Provider  Berry, Jonathan MD      Initial Encounter Date:    CARDIAC REHAB PHASE II ORIENTATION from 04/08/2017 in [facility]  Date  04/08/17  Referring Provider  Berry, Jonathan MD      Visit Diagnosis: 02/11/17 ST elevation myocardial infarction involving left anterior descending (LAD) coronary artery (HCC)  Patient's Home Medications on Admission:  Current Outpatient Prescriptions:  .  aspirin EC 81 MG tablet, Take 1 tablet (81 mg total) by mouth daily., Disp: 90 tablet, Rfl: 3 .  atorvastatin (LIPITOR) 40 MG tablet, Take 1 tablet (40 mg total) by mouth daily at 6 PM., Disp: 30 tablet, Rfl: 6 .  metoprolol tartrate (LOPRESSOR) 25 MG tablet, Take 0.5 tablets (12.5 mg total) by mouth 2 (two) times daily., Disp: 30 tablet, Rfl: 6 .  nitroGLYCERIN (NITROSTAT) 0.4 MG SL tablet, Place 1 tablet (0.4 mg total) under the tongue every 5 (five) minutes as needed for chest pain., Disp: 25 tablet, Rfl: 3 .  pantoprazole (PROTONIX) 40 MG tablet, Take 1 tablet (40 mg total) by mouth daily., Disp: 30 tablet, Rfl: 6 .  ticagrelor (BRILINTA) 90 MG TABS tablet, Take 1 tablet (90 mg total) by mouth 2 (two) times daily., Disp: 60 tablet, Rfl: 11  Past Medical History: Past Medical History:  Diagnosis Date  . BPH (benign prostatic hyperplasia)   . Hyperlipidemia   . Melanoma (HCC)   . Multiple myeloma (HCC)   . Pulmonary embolism (HCC) 06/15/2013  . Skin cancer (melanoma) (HCC)     Tobacco Use: History  Smoking Status  . Never Smoker  Smokeless Tobacco  . Never Used    Labs: Recent Review Flowsheet Data    Labs for ITP Cardiac and Pulmonary Rehab Latest Ref Rng & Units 05/24/2015 10/15/2016 02/11/2017 02/12/2017  04/16/2017   Cholestrol <200 mg/dL 185 210(H) - 197 119   LDLCALC <100 mg/dL 112(H) - - 122(H) 59   LDLDIRECT mg/dL - 146.0 - - -   HDL >40 mg/dL 32(L) 41.00 - 40(L) 33(L)   Trlycerides <150 mg/dL 207(H) 219.0(H) - 175(H) 137   Hemoglobin A1c 4.6 - 6.5 % 5.8(H) 5.5 - - -   TCO2 0 - 100 mmol/L - - 25 - -      Capillary Blood Glucose: Lab Results  Component Value Date   GLUCAP 94 05/23/2015   GLUCAP 100 (H) 09/25/2012     Exercise Target Goals:    Exercise Program Goal: Individual exercise prescription set with THRR, safety & activity barriers. Participant demonstrates ability to understand and report RPE using BORG scale, to self-measure pulse accurately, and to acknowledge the importance of the exercise prescription.  Exercise Prescription Goal: Starting with aerobic activity 30 plus minutes a day, 3 days per week for initial exercise prescription. Provide home exercise prescription and guidelines that participant acknowledges understanding prior to discharge.  Activity Barriers & Risk Stratification:     Activity Barriers & Cardiac Risk Stratification - 04/08/17 1346      Activity Barriers & Cardiac Risk Stratification   Activity Barriers Other (comment)   Comments R achilles tendon repair   Cardiac Risk Stratification High      6 Minute Walk:     6 Minute Walk      Row Name 04/08/17 1431         6 Minute Walk   Phase Initial     Distance 1708 feet     Walk Time 6 minutes     # of Rest Breaks 0     MPH 3.23     METS 3.7     RPE 7     VO2 Peak 12.96     Symptoms No     Resting HR 71 bpm     Resting BP 118/62     Max Ex. HR 102 bpm     Max Ex. BP 124/70     2 Minute Post BP 118/72        Oxygen Initial Assessment:   Oxygen Re-Evaluation:   Oxygen Discharge (Final Oxygen Re-Evaluation):   Initial Exercise Prescription:     Initial Exercise Prescription - 04/08/17 1400      Date of Initial Exercise RX and Referring Provider   Date 04/08/17    Referring Provider Nanetta Batty MD     Treadmill   MPH 2.7   Grade 1   Minutes 10   METs 3.44     Bike   Level 1   Minutes 10   METs 2.99     NuStep   Level 3   SPM 80   Minutes 10   METs 2     Prescription Details   Frequency (times per week) 3   Duration Progress to 30 minutes of continuous aerobic without signs/symptoms of physical distress     Intensity   THRR 40-80% of Max Heartrate 61-122   Ratings of Perceived Exertion 11-13   Perceived Dyspnea 0-4     Progression   Progression Continue to progress workloads to maintain intensity without signs/symptoms of physical distress.     Resistance Training   Training Prescription Yes   Weight 3lbs   Reps 10-15      Perform Capillary Blood Glucose checks as needed.  Exercise Prescription Changes:   Exercise Comments:     Exercise Comments    Row Name 04/16/17 1650           Exercise Comments Pt is off to a great start with exercise.           Exercise Goals and Review:     Exercise Goals    Row Name 04/08/17 1350 04/08/17 1435 04/08/17 1520         Exercise Goals   Increase Physical Activity Yes  -  -     Intervention Develop an individualized exercise prescription for aerobic and resistive training based on initial evaluation findings, risk stratification, comorbidities and participant's personal goals.;Provide advice, education, support and counseling about physical activity/exercise needs.  -  -     Expected Outcomes Achievement of increased cardiorespiratory fitness and enhanced flexibility, muscular endurance and strength shown through measurements of functional capacity and personal statement of participant.  -  -     Increase Strength and Stamina Yes -  Learn how to lift weights safely Yes     Intervention Provide advice, education, support and counseling about physical activity/exercise needs.;Develop an individualized exercise prescription for aerobic and resistive training based on  initial evaluation findings, risk stratification, comorbidities and participant's personal goals.  Learn exercise and activity limitations and be able to lift weights safely  -  -     Expected Outcomes Achievement of increased cardiorespiratory fitness and enhanced flexibility, muscular endurance and strength shown through measurements of functional  capacity and personal statement of participant.  -  -        Exercise Goals Re-Evaluation :     Exercise Goals Re-Evaluation    Row Name 04/16/17 1649             Exercise Goal Re-Evaluation   Exercise Goals Review Increase Physical Activity;Increase Strenth and Stamina       Comments Pt is doing well with exercise.  He states that he plays tennis 2-3 days/week and he walks at home everyday for 20mn/day.       Expected Outcomes continue with exercicse Rx and increase workloads as tolerated in order to increase cardiorespiratory fitness           Discharge Exercise Prescription (Final Exercise Prescription Changes):   Nutrition:  Target Goals: Understanding of nutrition guidelines, daily intake of sodium <15024m cholesterol <20060mcalories 30% from fat and 7% or less from saturated fats, daily to have 5 or more servings of fruits and vegetables.  Biometrics:     Pre Biometrics - 04/08/17 1436      Pre Biometrics   Waist Circumference 41.5 inches   Hip Circumference 41 inches   Waist to Hip Ratio 1.01 %   Triceps Skinfold 23 mm   % Body Fat 30.2 %   Grip Strength 39 kg   Flexibility 7.75 in   Single Leg Stand 30 seconds       Nutrition Therapy Plan and Nutrition Goals:   Nutrition Discharge: Nutrition Scores:   Nutrition Goals Re-Evaluation:   Nutrition Goals Re-Evaluation:   Nutrition Goals Discharge (Final Nutrition Goals Re-Evaluation):   Psychosocial: Target Goals: Acknowledge presence or absence of significant depression and/or stress, maximize coping skills, provide positive support system.  Participant is able to verbalize types and ability to use techniques and skills needed for reducing stress and depression.  Initial Review & Psychosocial Screening:     Initial Psych Review & Screening - 04/08/17 [place]     Barriers   Psychosocial barriers to participate in program There are no identifiable barriers or psychosocial needs.     Screening Interventions   Interventions Encouraged to exercise      Quality of Life Scores:     Quality of Life - 04/08/17 1510      Quality of Life Scores   Health/Function Pre 27.6 %   Socioeconomic Pre 27.21 %   Psych/Spiritual Pre 27 %   Family Pre 28.8 %   GLOBAL Pre 27.59 %      PHQ-9: Recent Review Flowsheet Data    [facility]2017 02/13/2015 10/18/2013   Decreased Interest 0 0 0   Down, Depressed, Hopeless 0 0 0   PHQ - 2 Score 0 0 0     Interpretation of Total Score  Total Score Depression Severity:  1-4 = Minimal depression, 5-9 = Mild depression, 10-14 = Moderate depression, 15-19 = Moderately severe depression, 20-27 = Severe depression   Psychosocial Evaluation and Intervention:   Psychosocial Re-Evaluation:     Psychosocial Re-Evaluation    [place] 04/22/17 0905             Psychosocial Re-Evaluation   Current issues with None Identified       Interventions Encouraged to attend Cardiac Rehabilitation for the exercise       Continue Psychosocial Services  No Follow up required  Psychosocial Discharge (Final Psychosocial Re-Evaluation):     Psychosocial Re-Evaluation - 04/22/17 0905      Psychosocial Re-Evaluation   Current issues with None Identified   Interventions Encouraged to attend Cardiac Rehabilitation for the exercise   Continue Psychosocial Services  No Follow up required      Vocational Rehabilitation: Provide vocational rehab assistance to qualifying candidates.   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      Education: Education Goals: Education classes will be provided on a weekly basis, covering required topics. Participant will state understanding/return demonstration of topics presented.  Learning Barriers/Preferences:     Learning Barriers/Preferences - 04/08/17 1346      Learning Barriers/Preferences   Learning Barriers Sight   Learning Preferences Skilled Demonstration      Education Topics: Count Your Pulse:  -Group instruction provided by verbal instruction, demonstration, patient participation and written materials to support subject.  Instructors address importance of being able to find your pulse and how to count your pulse when at home without a heart monitor.  Patients get hands on experience counting their pulse with staff help and individually.   Heart Attack, Angina, and Risk Factor Modification:  -Group instruction provided by verbal instruction, video, and written materials to support subject.  Instructors address signs and symptoms of angina and heart attacks.    Also discuss risk factors for heart disease and how to make changes to improve heart health risk factors.   Functional Fitness:  -Group instruction provided by verbal instruction, demonstration, patient participation, and written materials to support subject.  Instructors address safety measures for doing things around the house.  Discuss how to get up and down off the floor, how to pick things up properly, how to safely get out of a chair without assistance, and balance training.   Meditation and Mindfulness:  -Group instruction provided by verbal instruction, patient participation, and written materials to support subject.  Instructor addresses  importance of mindfulness and meditation practice to help reduce stress and improve awareness.  Instructor also leads participants through a meditation exercise.    Stretching for Flexibility and Mobility:  -Group instruction provided by verbal instruction, patient participation, and written materials to support subject.  Instructors lead participants through series of stretches that are designed to increase flexibility thus improving mobility.  These stretches are additional exercise for major muscle groups that are typically performed during regular warm up and cool down.   Hands Only CPR:  -Group verbal, video, and participation provides a basic overview of AHA guidelines for community CPR. Role-play of emergencies allow participants the opportunity to practice calling for help and chest compression technique with discussion of AED use.   Hypertension: -Group verbal and written instruction that provides a basic overview of hypertension including the most recent diagnostic guidelines, risk factor reduction with self-care instructions and medication management.    Nutrition I class: Heart Healthy Eating:  -Group instruction provided by PowerPoint slides, verbal discussion, and written materials to support subject matter. The instructor gives an explanation and review of the Therapeutic Lifestyle Changes diet recommendations, which includes a discussion on lipid goals, dietary fat, sodium, fiber, plant stanol/sterol esters, sugar, and the components of a well-balanced, healthy diet.   Nutrition II class:  Lifestyle Skills:  -Group instruction provided by PowerPoint slides, verbal discussion, and written materials to support subject matter. The instructor gives an explanation and review of label reading, grocery shopping for heart health, heart healthy recipe modifications, and ways to make healthier choices when eating out.   Diabetes Question & Answer:  -Group instruction provided by  PowerPoint slides, verbal discussion, and written materials to support subject matter. The instructor gives an explanation and review of diabetes co-morbidities, pre- and post-prandial blood glucose goals, pre-exercise blood glucose goals, signs, symptoms, and treatment of hypoglycemia and hyperglycemia, and foot care basics.   Diabetes Blitz:  -Group instruction provided by PowerPoint slides, verbal discussion, and written materials to support subject matter. The instructor gives an explanation and review of the physiology behind type 1 and type 2 diabetes, diabetes medications and rational behind using different medications, pre- and post-prandial blood glucose recommendations and Hemoglobin A1c goals, diabetes diet, and exercise including blood glucose guidelines for exercising safely.    Portion Distortion:  -Group instruction provided by PowerPoint slides, verbal discussion, written materials, and food models to support subject matter. The instructor gives an explanation of serving size versus portion size, changes in portions sizes over the last 20 years, and what consists of a serving from each food group.   Stress Management:  -Group instruction provided by verbal instruction, video, and written materials to support subject matter.  Instructors review role of stress in heart disease and how to cope with stress positively.     Exercising on Your Own:  -Group instruction provided by verbal instruction, power point, and written materials to support subject.  Instructors discuss benefits of exercise, components of exercise, frequency and intensity of exercise, and end points for exercise.  Also discuss use of nitroglycerin and activating EMS.  Review options of places to exercise outside of rehab.  Review guidelines for sex with heart disease.   Cardiac Drugs I:  -Group instruction provided by verbal instruction and written materials to support subject.  Instructor reviews cardiac drug  classes: antiplatelets, anticoagulants, beta blockers, and statins.  Instructor discusses reasons, side effects, and lifestyle considerations for each drug class.   Cardiac Drugs II:  -Group instruction provided by verbal instruction and written materials to support subject.  Instructor reviews cardiac drug classes: angiotensin converting enzyme inhibitors (ACE-I), angiotensin II receptor blockers (ARBs), nitrates, and calcium channel blockers.  Instructor discusses reasons, side effects, and lifestyle considerations for each drug class.   Anatomy and Physiology of the Circulatory System:  Group verbal and written instruction and models provide basic cardiac anatomy and physiology, with the coronary electrical and arterial systems. Review of: AMI, Angina, Valve disease, Heart Failure, Peripheral Artery Disease, Cardiac Arrhythmia, Pacemakers, and the ICD.   Other Education:  -Group or individual verbal, written, or video instructions that support the educational goals of the cardiac rehab program.   Knowledge Questionnaire Score:     Knowledge Questionnaire Score - 04/08/17 1341      Knowledge Questionnaire Score   Pre Score 21/24      Core Components/Risk Factors/Patient Goals at Admission:     Personal Goals and Risk Factors at Admission - 04/08/17 1436      Core Components/Risk Factors/Patient Goals on Admission   Lipids Yes   Intervention Provide education and support for participant on nutrition & aerobic/resistive exercise along with prescribed medications to achieve LDL '70mg'$ , HDL >'40mg'$ .   Expected Outcomes Short Term: Participant states understanding of desired cholesterol values and is compliant with medications  prescribed. Participant is following exercise prescription and nutrition guidelines.;Long Term: Cholesterol controlled with medications as prescribed, with individualized exercise RX and with personalized nutrition plan. Value goals: LDL < 44m, HDL > 40 mg.       Core Components/Risk Factors/Patient Goals Review:    Core Components/Risk Factors/Patient Goals at Discharge (Final Review):    ITP Comments:     ITP Comments    Row Name 04/08/17 1341           ITP Comments Medical Director, Dr. TFransico Him          Comments: RBenenis making expected progress toward personal goals after completing 5 sessions. Recommend continued exercise and life style modification education including  stress management and relaxation techniques to decrease cardiac risk profile. Mareo is off to a good start with exercise.MBarnet Pall RN,BSN 04/22/2017 9:13 AM

## 2017-04-23 ENCOUNTER — Encounter (HOSPITAL_COMMUNITY)
Admission: RE | Admit: 2017-04-23 | Discharge: 2017-04-23 | Disposition: A | Discharge status: Home / Self Care | Payer: Medicare Other | Source: Ambulatory Visit | Attending: Cardiovascular Disease | Admitting: Cardiovascular Disease

## 2017-04-23 DIAGNOSIS — I2121 ST elevation (STEMI) myocardial infarction involving left circumflex coronary artery: Secondary | ICD-10-CM | POA: Diagnosis not present

## 2017-04-23 DIAGNOSIS — I2102 ST elevation (STEMI) myocardial infarction involving left anterior descending coronary artery: Secondary | ICD-10-CM

## 2017-04-23 DIAGNOSIS — Z955 Presence of coronary angioplasty implant and graft: Secondary | ICD-10-CM | POA: Diagnosis not present

## 2017-04-25 ENCOUNTER — Encounter (HOSPITAL_COMMUNITY)
Admission: RE | Admit: 2017-04-25 | Discharge: 2017-04-25 | Disposition: A | Discharge status: Home / Self Care | Payer: Medicare Other | Source: Ambulatory Visit | Attending: Cardiovascular Disease | Admitting: Cardiovascular Disease

## 2017-04-25 DIAGNOSIS — I2102 ST elevation (STEMI) myocardial infarction involving left anterior descending coronary artery: Secondary | ICD-10-CM

## 2017-04-25 DIAGNOSIS — Z955 Presence of coronary angioplasty implant and graft: Secondary | ICD-10-CM | POA: Diagnosis not present

## 2017-04-25 DIAGNOSIS — I2121 ST elevation (STEMI) myocardial infarction involving left circumflex coronary artery: Secondary | ICD-10-CM | POA: Diagnosis not present

## 2017-04-30 ENCOUNTER — Encounter (HOSPITAL_COMMUNITY)
Admission: RE | Admit: 2017-04-30 | Discharge: 2017-04-30 | Disposition: A | Discharge status: Home / Self Care | Payer: Medicare Other | Source: Ambulatory Visit | Attending: Cardiovascular Disease | Admitting: Cardiovascular Disease

## 2017-04-30 DIAGNOSIS — Z955 Presence of coronary angioplasty implant and graft: Secondary | ICD-10-CM | POA: Diagnosis not present

## 2017-04-30 DIAGNOSIS — I2102 ST elevation (STEMI) myocardial infarction involving left anterior descending coronary artery: Secondary | ICD-10-CM

## 2017-04-30 DIAGNOSIS — I2121 ST elevation (STEMI) myocardial infarction involving left circumflex coronary artery: Secondary | ICD-10-CM | POA: Diagnosis not present

## 2017-05-02 ENCOUNTER — Encounter (HOSPITAL_COMMUNITY)
Admission: RE | Admit: 2017-05-02 | Discharge: 2017-05-02 | Disposition: A | Discharge status: Home / Self Care | Payer: Medicare Other | Source: Ambulatory Visit | Attending: Cardiovascular Disease | Admitting: Cardiovascular Disease

## 2017-05-02 DIAGNOSIS — I2102 ST elevation (STEMI) myocardial infarction involving left anterior descending coronary artery: Secondary | ICD-10-CM

## 2017-05-02 DIAGNOSIS — Z955 Presence of coronary angioplasty implant and graft: Secondary | ICD-10-CM | POA: Diagnosis not present

## 2017-05-02 DIAGNOSIS — I2121 ST elevation (STEMI) myocardial infarction involving left circumflex coronary artery: Secondary | ICD-10-CM | POA: Insufficient documentation

## 2017-05-05 ENCOUNTER — Encounter (HOSPITAL_COMMUNITY)
Admission: RE | Admit: 2017-05-05 | Discharge: 2017-05-05 | Disposition: A | Discharge status: Home / Self Care | Payer: Medicare Other | Source: Ambulatory Visit | Attending: Cardiovascular Disease | Admitting: Cardiovascular Disease

## 2017-05-05 DIAGNOSIS — I2121 ST elevation (STEMI) myocardial infarction involving left circumflex coronary artery: Secondary | ICD-10-CM | POA: Diagnosis not present

## 2017-05-05 DIAGNOSIS — I2102 ST elevation (STEMI) myocardial infarction involving left anterior descending coronary artery: Secondary | ICD-10-CM

## 2017-05-05 DIAGNOSIS — Z955 Presence of coronary angioplasty implant and graft: Secondary | ICD-10-CM | POA: Diagnosis not present

## 2017-05-07 ENCOUNTER — Encounter (HOSPITAL_COMMUNITY)
Admission: RE | Admit: 2017-05-07 | Discharge: 2017-05-07 | Disposition: A | Discharge status: Home / Self Care | Payer: Medicare Other | Source: Ambulatory Visit | Attending: Cardiovascular Disease | Admitting: Cardiovascular Disease

## 2017-05-07 DIAGNOSIS — Z955 Presence of coronary angioplasty implant and graft: Secondary | ICD-10-CM | POA: Diagnosis not present

## 2017-05-07 DIAGNOSIS — I2121 ST elevation (STEMI) myocardial infarction involving left circumflex coronary artery: Secondary | ICD-10-CM | POA: Diagnosis not present

## 2017-05-07 DIAGNOSIS — I2102 ST elevation (STEMI) myocardial infarction involving left anterior descending coronary artery: Secondary | ICD-10-CM

## 2017-05-09 ENCOUNTER — Encounter (HOSPITAL_COMMUNITY)
Admission: RE | Admit: 2017-05-09 | Discharge: 2017-05-09 | Disposition: A | Discharge status: Home / Self Care | Payer: Medicare Other | Source: Ambulatory Visit | Attending: Cardiovascular Disease | Admitting: Cardiovascular Disease

## 2017-05-09 DIAGNOSIS — I2121 ST elevation (STEMI) myocardial infarction involving left circumflex coronary artery: Secondary | ICD-10-CM | POA: Diagnosis not present

## 2017-05-09 DIAGNOSIS — Z955 Presence of coronary angioplasty implant and graft: Secondary | ICD-10-CM | POA: Diagnosis not present

## 2017-05-09 DIAGNOSIS — I2102 ST elevation (STEMI) myocardial infarction involving left anterior descending coronary artery: Secondary | ICD-10-CM

## 2017-05-12 ENCOUNTER — Encounter (HOSPITAL_COMMUNITY)
Admission: RE | Admit: 2017-05-12 | Discharge: 2017-05-12 | Disposition: A | Discharge status: Home / Self Care | Payer: Medicare Other | Source: Ambulatory Visit | Attending: Cardiovascular Disease | Admitting: Cardiovascular Disease

## 2017-05-12 DIAGNOSIS — Z955 Presence of coronary angioplasty implant and graft: Secondary | ICD-10-CM | POA: Diagnosis not present

## 2017-05-12 DIAGNOSIS — I2121 ST elevation (STEMI) myocardial infarction involving left circumflex coronary artery: Secondary | ICD-10-CM | POA: Diagnosis not present

## 2017-05-12 DIAGNOSIS — I2102 ST elevation (STEMI) myocardial infarction involving left anterior descending coronary artery: Secondary | ICD-10-CM

## 2017-05-14 ENCOUNTER — Encounter (HOSPITAL_COMMUNITY)
Admission: RE | Admit: 2017-05-14 | Discharge: 2017-05-14 | Disposition: A | Discharge status: Home / Self Care | Payer: Medicare Other | Source: Ambulatory Visit | Attending: Cardiovascular Disease | Admitting: Cardiovascular Disease

## 2017-05-14 DIAGNOSIS — I2102 ST elevation (STEMI) myocardial infarction involving left anterior descending coronary artery: Secondary | ICD-10-CM

## 2017-05-14 DIAGNOSIS — I2121 ST elevation (STEMI) myocardial infarction involving left circumflex coronary artery: Secondary | ICD-10-CM | POA: Diagnosis not present

## 2017-05-14 DIAGNOSIS — Z955 Presence of coronary angioplasty implant and graft: Secondary | ICD-10-CM | POA: Diagnosis not present

## 2017-05-16 ENCOUNTER — Encounter (HOSPITAL_COMMUNITY): Payer: Medicare Other

## 2017-05-19 ENCOUNTER — Encounter (HOSPITAL_COMMUNITY)
Admission: RE | Admit: 2017-05-19 | Discharge: 2017-05-19 | Disposition: A | Discharge status: Home / Self Care | Payer: Medicare Other | Source: Ambulatory Visit | Attending: Cardiovascular Disease | Admitting: Cardiovascular Disease

## 2017-05-19 DIAGNOSIS — I2121 ST elevation (STEMI) myocardial infarction involving left circumflex coronary artery: Secondary | ICD-10-CM | POA: Diagnosis not present

## 2017-05-19 DIAGNOSIS — I2102 ST elevation (STEMI) myocardial infarction involving left anterior descending coronary artery: Secondary | ICD-10-CM

## 2017-05-19 DIAGNOSIS — Z955 Presence of coronary angioplasty implant and graft: Secondary | ICD-10-CM | POA: Diagnosis not present

## 2017-05-19 NOTE — Progress Notes (Signed)
Cardiac Individual Treatment Plan  Patient Details  Name: John Mccarthy MRN: [id number] Date of Birth: 29-Jul-1951 Referring Provider:     [facility] from 04/08/2017 in [place]  Referring Provider  Quay Burow MD      Initial Encounter Date:    CARDIAC REHAB PHASE II ORIENTATION from 04/08/2017 in [place]  Date  04/08/17  Referring Provider  Quay Burow MD      Visit Diagnosis: 02/11/17 ST elevation myocardial infarction involving left anterior descending (LAD) coronary artery ([place])  Patient's Home Medications on Admission:  Current Outpatient Prescriptions:  .  aspirin EC 81 MG tablet, Take 1 tablet (81 mg total) by mouth daily., Disp: 90 tablet, Rfl: 3 .  atorvastatin (LIPITOR) 40 MG tablet, Take 1 tablet (40 mg total) by mouth daily at 6 PM., Disp: 30 tablet, Rfl: 6 .  metoprolol tartrate (LOPRESSOR) 25 MG tablet, Take 0.5 tablets (12.5 mg total) by mouth 2 (two) times daily., Disp: 30 tablet, Rfl: 6 .  nitroGLYCERIN (NITROSTAT) 0.4 MG SL tablet, Place 1 tablet (0.4 mg total) under the tongue every 5 (five) minutes as needed for chest pain., Disp: 25 tablet, Rfl: 3 .  pantoprazole (PROTONIX) 40 MG tablet, Take 1 tablet (40 mg total) by mouth daily., Disp: 30 tablet, Rfl: 6 .  ticagrelor (BRILINTA) 90 MG TABS tablet, Take 1 tablet (90 mg total) by mouth 2 (two) times daily., Disp: 60 tablet, Rfl: 11  Past Medical History: Past Medical History:  Diagnosis Date  . BPH (benign prostatic hyperplasia)   . Hyperlipidemia   . Melanoma ([place])   . Multiple myeloma ([place])   . Pulmonary embolism ([place]) 06/15/2013  . Skin cancer (melanoma) ([facility])     Tobacco Use: History  Smoking Status  . Never Smoker  Smokeless Tobacco  . Never Used    Labs: Recent Review Flowsheet Data    Labs for ITP Cardiac and Pulmonary Rehab Latest Ref Rng & Units 05/24/2015 10/15/2016 02/11/2017 02/12/2017  04/16/2017   Cholestrol <200 mg/dL 185 210(H) - 197 119   LDLCALC <100 mg/dL 112(H) - - 122(H) 59   LDLDIRECT mg/dL - 146.0 - - -   HDL >40 mg/dL 32(L) 41.00 - 40(L) 33(L)   Trlycerides <150 mg/dL 207(H) 219.0(H) - 175(H) 137   Hemoglobin A1c 4.6 - 6.5 % 5.8(H) 5.5 - - -   TCO2 0 - 100 mmol/L - - 25 - -      Capillary Blood Glucose: Lab Results  Component Value Date   GLUCAP 94 05/23/2015   GLUCAP 100 (H) 09/25/2012     Exercise Target Goals:    Exercise Program Goal: Individual exercise prescription set with THRR, safety & activity barriers. Participant demonstrates ability to understand and report RPE using BORG scale, to self-measure pulse accurately, and to acknowledge the importance of the exercise prescription.  Exercise Prescription Goal: Starting with aerobic activity 30 plus minutes a day, 3 days per week for initial exercise prescription. Provide home exercise prescription and guidelines that participant acknowledges understanding prior to discharge.  Activity Barriers & Risk Stratification:     Activity Barriers & Cardiac Risk Stratification - 04/08/17 1346      Activity Barriers & Cardiac Risk Stratification   Activity Barriers Other (comment)   Comments R achilles tendon repair   Cardiac Risk Stratification High      6 Minute Walk:     6 Minute Walk  Row Name 04/08/17 1431         6 Minute Walk   Phase Initial     Distance 1708 feet     Walk Time 6 minutes     # of Rest Breaks 0     MPH 3.23     METS 3.7     RPE 7     VO2 Peak 12.96     Symptoms No     Resting HR 71 bpm     Resting BP 118/62     Max Ex. HR 102 bpm     Max Ex. BP 124/70     2 Minute Post BP 118/72        Oxygen Initial Assessment:   Oxygen Re-Evaluation:   Oxygen Discharge (Final Oxygen Re-Evaluation):   Initial Exercise Prescription:     Initial Exercise Prescription - 04/08/17 1400      Date of Initial Exercise RX and Referring Provider   Date 04/08/17    Referring Provider Berry, Jonathan MD     Treadmill   MPH 2.7   Grade 1   Minutes 10   METs 3.44     Bike   Level 1   Minutes 10   METs 2.99     NuStep   Level 3   SPM 80   Minutes 10   METs 2     Prescription Details   Frequency (times per week) 3   Duration Progress to 30 minutes of continuous aerobic without signs/symptoms of physical distress     Intensity   THRR 40-80% of Max Heartrate 61-122   Ratings of Perceived Exertion 11-13   Perceived Dyspnea 0-4     Progression   Progression Continue to progress workloads to maintain intensity without signs/symptoms of physical distress.     Resistance Training   Training Prescription Yes   Weight 3lbs   Reps 10-15      Perform Capillary Blood Glucose checks as needed.  Exercise Prescription Changes:      Exercise Prescription Changes    Row Name 05/21/17 1200             Response to Exercise   Blood Pressure (Admit) 128/74       Blood Pressure (Exercise) 134/82       Blood Pressure (Exit) 116/60       Heart Rate (Admit) 70 bpm       Heart Rate (Exercise) 104 bpm       Heart Rate (Exit) 58 bpm       Rating of Perceived Exertion (Exercise) 11       Duration Progress to 45 minutes of aerobic exercise without signs/symptoms of physical distress       Intensity THRR unchanged         Progression   Progression Continue to progress workloads to maintain intensity without signs/symptoms of physical distress.       Average METs 4.5         Resistance Training   Training Prescription Yes       Weight 8lb       Reps 10-15         Bike   Level 2       Minutes 10       METs 4.96         NuStep   Level 6       SPM 80       Minutes 10         METs 3.8         Rower   Level 7       Watts 37       Minutes 10       METs 4.8         Home Exercise Plan   Plans to continue exercise at Home (comment)       Frequency Add 3 additional days to program exercise sessions.          Exercise Comments:       Exercise Comments    Row Name 04/16/17 1650 05/21/17 1121         Exercise Comments Pt is off to a great start with exercise.  Reviewed METs and goals wiht pt.          Exercise Goals and Review:      Exercise Goals    Row Name 04/08/17 1350 04/08/17 1435 04/08/17 1520         Exercise Goals   Increase Physical Activity Yes  -  -     Intervention Develop an individualized exercise prescription for aerobic and resistive training based on initial evaluation findings, risk stratification, comorbidities and participant's personal goals.;Provide advice, education, support and counseling about physical activity/exercise needs.  -  -     Expected Outcomes Achievement of increased cardiorespiratory fitness and enhanced flexibility, muscular endurance and strength shown through measurements of functional capacity and personal statement of participant.  -  -     Increase Strength and Stamina Yes -  Learn how to lift weights safely Yes     Intervention Provide advice, education, support and counseling about physical activity/exercise needs.;Develop an individualized exercise prescription for aerobic and resistive training based on initial evaluation findings, risk stratification, comorbidities and participant's personal goals.  Learn exercise and activity limitations and be able to lift weights safely  -  -     Expected Outcomes Achievement of increased cardiorespiratory fitness and enhanced flexibility, muscular endurance and strength shown through measurements of functional capacity and personal statement of participant.  -  -        Exercise Goals Re-Evaluation :     Exercise Goals Re-Evaluation    Row Name 04/16/17 1649 05/21/17 1120           Exercise Goal Re-Evaluation   Exercise Goals Review Increase Physical Activity;Increase Strenth and Stamina Increase Physical Activity;Increase Strenth and Stamina      Comments Pt is doing well with exercise.  He states that he plays  tennis 2-3 days/week and he walks at home everyday for 59mn/day. Pt is playing some tennis, when it's not too hot outside and he's walking and doing strength training on his off days from [facility]      Expected Outcomes continue with exercicse Rx and increase workloads as tolerated in order to increase cardiorespiratory fitness continue with exercicse Rx and increase workloads as tolerated in order to increase cardiorespiratory fitness          Discharge Exercise Prescription (Final Exercise Prescription Changes):     Exercise Prescription Changes - 05/21/17 1200      Response to Exercise   Blood Pressure (Admit) 128/74   Blood Pressure (Exercise) 134/82   Blood Pressure (Exit) 116/60   Heart Rate (Admit) 70 bpm   Heart Rate (Exercise) 104 bpm   Heart Rate (Exit) 58 bpm   Rating of Perceived Exertion (Exercise) 11   Duration Progress to 45 minutes of aerobic exercise without signs/symptoms of physical distress  Intensity THRR unchanged     Progression   Progression Continue to progress workloads to maintain intensity without signs/symptoms of physical distress.   Average METs 4.5     Resistance Training   Training Prescription Yes   Weight 8lb   Reps 10-15     Bike   Level 2   Minutes 10   METs 4.96     NuStep   Level 6   SPM 80   Minutes 10   METs 3.8     Rower   Level 7   Watts 37   Minutes 10   METs 4.8     Home Exercise Plan   Plans to continue exercise at Home (comment)   Frequency Add 3 additional days to program exercise sessions.      Nutrition:  Target Goals: Understanding of nutrition guidelines, daily intake of sodium <1500mg, cholesterol <200mg, calories 30% from fat and 7% or less from saturated fats, daily to have 5 or more servings of fruits and vegetables.  Biometrics:     Pre Biometrics - 04/08/17 1436      Pre Biometrics   Waist Circumference 41.5 inches   Hip Circumference 41 inches   Waist to Hip Ratio 1.01 %   Triceps Skinfold 23 mm    % Body Fat 30.2 %   Grip Strength 39 kg   Flexibility 7.75 in   Single Leg Stand 30 seconds       Nutrition Therapy Plan and Nutrition Goals:   Nutrition Discharge: Nutrition Scores:   Nutrition Goals Re-Evaluation:   Nutrition Goals Re-Evaluation:   Nutrition Goals Discharge (Final Nutrition Goals Re-Evaluation):   Psychosocial: Target Goals: Acknowledge presence or absence of significant depression and/or stress, maximize coping skills, provide positive support system. Participant is able to verbalize types and ability to use techniques and skills needed for reducing stress and depression.  Initial Review & Psychosocial Screening:     Initial Psych Review & Screening - 04/08/17 1645      Family Dynamics   Good Support System? Yes     Barriers   Psychosocial barriers to participate in program There are no identifiable barriers or psychosocial needs.     Screening Interventions   Interventions Encouraged to exercise      Quality of Life Scores:     Quality of Life - 04/08/17 1510      Quality of Life Scores   Health/Function Pre 27.6 %   Socioeconomic Pre 27.21 %   Psych/Spiritual Pre 27 %   Family Pre 28.8 %   GLOBAL Pre 27.59 %      PHQ-9: Recent Review Flowsheet Data    Depression screen PHQ 2/9 04/14/2017 02/13/2015 10/18/2013   Decreased Interest 0 0 0   Down, Depressed, Hopeless 0 0 0   PHQ - 2 Score 0 0 0     Interpretation of Total Score  Total Score Depression Severity:  1-4 = Minimal depression, 5-9 = Mild depression, 10-14 = Moderate depression, 15-19 = Moderately severe depression, 20-27 = Severe depression   Psychosocial Evaluation and Intervention:   Psychosocial Re-Evaluation:     Psychosocial Re-Evaluation    Row Name 04/22/17 0905 05/19/17 1652           Psychosocial Re-Evaluation   Current issues with None Identified None Identified      Interventions Encouraged to attend Cardiac Rehabilitation for the exercise  Encouraged to attend Cardiac Rehabilitation for the exercise      Continue Psychosocial Services    No Follow up required No Follow up required         Psychosocial Discharge (Final Psychosocial Re-Evaluation):     Psychosocial Re-Evaluation - 05/19/17 1652      Psychosocial Re-Evaluation   Current issues with None Identified   Interventions Encouraged to attend Cardiac Rehabilitation for the exercise   Continue Psychosocial Services  No Follow up required      Vocational Rehabilitation: Provide vocational rehab assistance to qualifying candidates.   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      Education: Education Goals: Education classes will be provided on a weekly basis, covering required topics. Participant will state understanding/return demonstration of topics presented.  Learning Barriers/Preferences:     Learning Barriers/Preferences - 04/08/17 1346      Learning Barriers/Preferences   Learning Barriers Sight   Learning Preferences Skilled Demonstration      Education Topics: Count Your Pulse:  -Group instruction provided by verbal instruction, demonstration, patient participation and written materials to support subject.  Instructors address importance of being able to find your pulse and how to count your pulse when at home without a heart monitor.  Patients get hands on experience counting their pulse with staff help and individually.   CARDIAC REHAB PHASE II EXERCISE from 05/12/2017 in  [facility]  Date  05/02/17  Instruction Review Code  2- meets goals/outcomes      Heart Attack, Angina, and Risk Factor Modification:  -Group instruction provided by verbal instruction, video, and  written materials to support subject.  Instructors address signs and symptoms of angina and heart attacks.    Also discuss risk factors for heart disease and how to make changes to improve heart health risk factors.   Functional Fitness:  -Group instruction provided by verbal instruction, demonstration, patient participation, and written materials to support subject.  Instructors address safety measures for doing things around the house.  Discuss how to get up and down off the floor, how to pick things up properly, how to safely get out of a chair without assistance, and balance training.   Meditation and Mindfulness:  -Group instruction provided by verbal instruction, patient participation, and written materials to support subject.  Instructor addresses importance of mindfulness and meditation practice to help reduce stress and improve awareness.  Instructor also leads participants through a meditation exercise.    Stretching for Flexibility and Mobility:  -Group instruction provided by verbal instruction, patient participation, and written materials to support subject.  Instructors lead participants through series of stretches that are designed to increase flexibility thus improving mobility.  These stretches are additional exercise for major muscle groups that are typically performed during regular warm up and cool down.   Hands Only CPR:  -Group verbal, video, and participation provides a basic overview of AHA guidelines for community CPR. Role-play of emergencies allow participants the opportunity to practice calling for help and chest compression technique with discussion of AED use.   Hypertension: -Group verbal and written instruction that provides a basic overview of hypertension including the most recent diagnostic guidelines, risk factor reduction with self-care instructions and medication management.    Nutrition I class: Heart Healthy Eating:  -Group instruction provided by  PowerPoint slides, verbal discussion, and written   materials to support subject matter. The instructor gives an explanation and review of the Therapeutic Lifestyle Changes diet recommendations, which includes a discussion on lipid goals, dietary fat, sodium, fiber, plant stanol/sterol esters, sugar, and the components of a well-balanced, healthy diet.   CARDIAC REHAB PHASE II EXERCISE from 05/12/2017 in [facility]  Date  05/06/17  Educator  RD  Instruction Review Code  2- meets goals/outcomes      Nutrition II class: Lifestyle Skills:  -Group instruction provided by PowerPoint slides, verbal discussion, and written materials to support subject matter. The instructor gives an explanation and review of label reading, grocery shopping for heart health, heart healthy recipe modifications, and ways to make healthier choices when eating out.   CARDIAC REHAB PHASE II EXERCISE from 05/12/2017 in [facility]  Date  05/13/17  Educator  RD  Instruction Review Code  2- meets goals/outcomes      Diabetes Question & Answer:  -Group instruction provided by PowerPoint slides, verbal discussion, and written materials to support subject matter. The instructor gives an explanation and review of diabetes co-morbidities, pre- and post-prandial blood glucose goals, pre-exercise blood glucose goals, signs, symptoms, and treatment of hypoglycemia and hyperglycemia, and foot care basics.   Diabetes Blitz:  -Group instruction provided by PowerPoint slides, verbal discussion, and written materials to support subject matter. The instructor gives an explanation and review of the physiology behind type 1 and type 2 diabetes, diabetes medications and rational behind using different medications, pre- and post-prandial blood glucose recommendations and Hemoglobin A1c goals, diabetes diet, and exercise including blood glucose guidelines for exercising safely.     Portion Distortion:  -Group instruction provided by PowerPoint slides, verbal discussion, written materials, and food models to support subject matter. The instructor gives an explanation of serving size versus portion size, changes in portions sizes over the last 20 years, and what consists of a serving from each food group.   Stress Management:  -Group instruction provided by verbal instruction, video, and written materials to support subject matter.  Instructors review role of stress in heart disease and how to cope with stress positively.     Exercising on Your Own:  -Group instruction provided by verbal instruction, power point, and written materials to support subject.  Instructors discuss benefits of exercise, components of exercise, frequency and intensity of exercise, and end points for exercise.  Also discuss use of nitroglycerin and activating EMS.  Review options of places to exercise outside of rehab.  Review guidelines for sex with heart disease.   Cardiac Drugs I:  -Group instruction provided by verbal instruction and written materials to support subject.  Instructor reviews cardiac drug classes: antiplatelets, anticoagulants, beta blockers, and statins.  Instructor discusses reasons, side effects, and lifestyle considerations for each drug class.   Cardiac Drugs II:  -Group instruction provided by verbal instruction and written materials to support subject.  Instructor reviews cardiac drug classes: angiotensin converting enzyme inhibitors (ACE-I), angiotensin II receptor blockers (ARBs), nitrates, and calcium channel blockers.  Instructor discusses reasons, side effects, and lifestyle considerations for each drug class.   Anatomy and Physiology of the Circulatory System:  Group verbal and written instruction and models provide basic cardiac anatomy and physiology, with the coronary electrical and arterial systems. Review of: AMI, Angina, Valve disease, Heart Failure,  Peripheral Artery Disease, Cardiac Arrhythmia, Pacemakers, and the ICD.   CARDIAC REHAB PHASE II EXERCISE from 05/12/2017 in  [facility]    Date  04/30/17  Instruction Review Code  2- meets goals/outcomes      Other Education:  -Group or individual verbal, written, or video instructions that support the educational goals of the cardiac rehab program.   Knowledge Questionnaire Score:     Knowledge Questionnaire Score - 04/08/17 1341      Knowledge Questionnaire Score   Pre Score 21/24      Core Components/Risk Factors/Patient Goals at Admission:     Personal Goals and Risk Factors at Admission - 04/08/17 1436      Core Components/Risk Factors/Patient Goals on Admission   Lipids Yes   Intervention Provide education and support for participant on nutrition & aerobic/resistive exercise along with prescribed medications to achieve LDL <70mg, HDL >40mg.   Expected Outcomes Short Term: Participant states understanding of desired cholesterol values and is compliant with medications prescribed. Participant is following exercise prescription and nutrition guidelines.;Long Term: Cholesterol controlled with medications as prescribed, with individualized exercise RX and with personalized nutrition plan. Value goals: LDL < 70mg, HDL > 40 mg.      Core Components/Risk Factors/Patient Goals Review:    Core Components/Risk Factors/Patient Goals at Discharge (Final Review):    ITP Comments:     ITP Comments    Row Name 04/08/17 1341           ITP Comments Medical Director, Dr. Traci Turner           Comments: Collyn is making expected progress toward personal goals after completing 15 sessions. Recommend continued exercise and life style modification education including  stress management and relaxation techniques to decrease cardiac risk profile. Rawlins continues to do well with exercise at cardiac rehab.Maria Whitaker, RN,BSN 05/21/2017 4:36 PM 

## 2017-05-21 ENCOUNTER — Encounter (HOSPITAL_COMMUNITY)
Admission: RE | Admit: 2017-05-21 | Discharge: 2017-05-21 | Disposition: A | Discharge status: Home / Self Care | Payer: Medicare Other | Source: Ambulatory Visit | Attending: Cardiovascular Disease | Admitting: Cardiovascular Disease

## 2017-05-21 DIAGNOSIS — I2121 ST elevation (STEMI) myocardial infarction involving left circumflex coronary artery: Secondary | ICD-10-CM | POA: Diagnosis not present

## 2017-05-21 DIAGNOSIS — I2102 ST elevation (STEMI) myocardial infarction involving left anterior descending coronary artery: Secondary | ICD-10-CM

## 2017-05-21 DIAGNOSIS — Z955 Presence of coronary angioplasty implant and graft: Secondary | ICD-10-CM | POA: Diagnosis not present

## 2017-05-23 ENCOUNTER — Encounter (HOSPITAL_COMMUNITY)
Admission: RE | Admit: 2017-05-23 | Discharge: 2017-05-23 | Disposition: A | Discharge status: Home / Self Care | Payer: Medicare Other | Source: Ambulatory Visit | Attending: Cardiovascular Disease | Admitting: Cardiovascular Disease

## 2017-05-23 DIAGNOSIS — I2102 ST elevation (STEMI) myocardial infarction involving left anterior descending coronary artery: Secondary | ICD-10-CM

## 2017-05-23 DIAGNOSIS — I2121 ST elevation (STEMI) myocardial infarction involving left circumflex coronary artery: Secondary | ICD-10-CM | POA: Diagnosis not present

## 2017-05-23 DIAGNOSIS — Z955 Presence of coronary angioplasty implant and graft: Secondary | ICD-10-CM | POA: Diagnosis not present

## 2017-05-26 ENCOUNTER — Encounter (HOSPITAL_COMMUNITY)
Admission: RE | Admit: 2017-05-26 | Discharge: 2017-05-26 | Disposition: A | Discharge status: Home / Self Care | Payer: Medicare Other | Source: Ambulatory Visit | Attending: Cardiovascular Disease | Admitting: Cardiovascular Disease

## 2017-05-26 DIAGNOSIS — Z955 Presence of coronary angioplasty implant and graft: Secondary | ICD-10-CM | POA: Diagnosis not present

## 2017-05-26 DIAGNOSIS — I2121 ST elevation (STEMI) myocardial infarction involving left circumflex coronary artery: Secondary | ICD-10-CM | POA: Diagnosis not present

## 2017-05-26 DIAGNOSIS — I2102 ST elevation (STEMI) myocardial infarction involving left anterior descending coronary artery: Secondary | ICD-10-CM

## 2017-05-28 ENCOUNTER — Encounter (HOSPITAL_COMMUNITY)
Admission: RE | Admit: 2017-05-28 | Discharge: 2017-05-28 | Disposition: A | Discharge status: Home / Self Care | Payer: Medicare Other | Source: Ambulatory Visit | Attending: Cardiovascular Disease | Admitting: Cardiovascular Disease

## 2017-05-28 DIAGNOSIS — I2121 ST elevation (STEMI) myocardial infarction involving left circumflex coronary artery: Secondary | ICD-10-CM | POA: Diagnosis not present

## 2017-05-28 DIAGNOSIS — H5213 Myopia, bilateral: Secondary | ICD-10-CM | POA: Diagnosis not present

## 2017-05-28 DIAGNOSIS — Z955 Presence of coronary angioplasty implant and graft: Secondary | ICD-10-CM | POA: Diagnosis not present

## 2017-05-28 DIAGNOSIS — H524 Presbyopia: Secondary | ICD-10-CM | POA: Diagnosis not present

## 2017-05-28 DIAGNOSIS — H52203 Unspecified astigmatism, bilateral: Secondary | ICD-10-CM | POA: Diagnosis not present

## 2017-05-28 DIAGNOSIS — H2513 Age-related nuclear cataract, bilateral: Secondary | ICD-10-CM | POA: Diagnosis not present

## 2017-05-28 DIAGNOSIS — I2102 ST elevation (STEMI) myocardial infarction involving left anterior descending coronary artery: Secondary | ICD-10-CM

## 2017-05-30 ENCOUNTER — Encounter (HOSPITAL_COMMUNITY): Payer: Medicare Other

## 2017-06-02 ENCOUNTER — Encounter (HOSPITAL_COMMUNITY)
Admission: RE | Admit: 2017-06-02 | Discharge: 2017-06-02 | Disposition: A | Discharge status: Home / Self Care | Payer: Medicare Other | Source: Ambulatory Visit | Attending: Cardiovascular Disease | Admitting: Cardiovascular Disease

## 2017-06-02 DIAGNOSIS — Z955 Presence of coronary angioplasty implant and graft: Secondary | ICD-10-CM | POA: Diagnosis not present

## 2017-06-02 DIAGNOSIS — I2121 ST elevation (STEMI) myocardial infarction involving left circumflex coronary artery: Secondary | ICD-10-CM | POA: Insufficient documentation

## 2017-06-02 DIAGNOSIS — I2102 ST elevation (STEMI) myocardial infarction involving left anterior descending coronary artery: Secondary | ICD-10-CM

## 2017-06-02 NOTE — Progress Notes (Signed)
John Mccarthy 66 y.o. male Nutrition Note Spoke with pt.  Nutrition Survey reviewed with pt. Pt is following Step 2 of the Therapeutic Lifestyle Changes diet. Pt expressed understanding of the information reviewed. Pt aware of nutrition education classes offered. Lab Results  Component Value Date   HGBA1C 5.5 10/15/2016   Wt Readings from Last 3 Encounters:  04/18/17 206 lb 9.6 oz (93.7 kg)  04/08/17 208 lb 8.9 oz (94.6 kg)  02/25/17 209 lb (94.8 kg)   Nutrition Diagnosis ? Food-and nutrition-related knowledge deficit related to lack of exposure to information as related to diagnosis of: ? CVD ? Overweight related to excessive energy intake as evidenced by a BMI of 29.1 Nutrition Intervention ? Benefits of adopting Therapeutic Lifestyle Changes discussed when Medficts reviewed. ? Pt to attend the Portion Distortion class - met 05/21/17 ? Pt to attend the  ? Nutrition I class - met 05/06/17                       ? Nutrition II class - met 05/13/17 ? Continue client-centered nutrition education by RD, as part of interdisciplinary care.  Goal(s) ? Pt to identify food quantities necessary to achieve weight loss of 6-24 lb (2.7-10.9 kg) at graduation from cardiac rehab.   Monitor and Evaluate progress toward nutrition goal with team.  Derek Mound, M.Ed, RD, LDN, CDE 06/02/2017 11:21 AM

## 2017-06-06 ENCOUNTER — Encounter (HOSPITAL_COMMUNITY)
Admission: RE | Admit: 2017-06-06 | Discharge: 2017-06-06 | Disposition: A | Discharge status: Home / Self Care | Payer: Medicare Other | Source: Ambulatory Visit | Attending: Cardiovascular Disease | Admitting: Cardiovascular Disease

## 2017-06-06 DIAGNOSIS — Z955 Presence of coronary angioplasty implant and graft: Secondary | ICD-10-CM | POA: Diagnosis not present

## 2017-06-06 DIAGNOSIS — I2121 ST elevation (STEMI) myocardial infarction involving left circumflex coronary artery: Secondary | ICD-10-CM | POA: Diagnosis not present

## 2017-06-06 DIAGNOSIS — I2102 ST elevation (STEMI) myocardial infarction involving left anterior descending coronary artery: Secondary | ICD-10-CM

## 2017-06-09 ENCOUNTER — Encounter (HOSPITAL_COMMUNITY)
Admission: RE | Admit: 2017-06-09 | Discharge: 2017-06-09 | Disposition: A | Discharge status: Home / Self Care | Payer: Medicare Other | Source: Ambulatory Visit | Attending: Cardiovascular Disease | Admitting: Cardiovascular Disease

## 2017-06-09 DIAGNOSIS — Z955 Presence of coronary angioplasty implant and graft: Secondary | ICD-10-CM | POA: Diagnosis not present

## 2017-06-09 DIAGNOSIS — I2121 ST elevation (STEMI) myocardial infarction involving left circumflex coronary artery: Secondary | ICD-10-CM | POA: Diagnosis not present

## 2017-06-09 DIAGNOSIS — I2102 ST elevation (STEMI) myocardial infarction involving left anterior descending coronary artery: Secondary | ICD-10-CM

## 2017-06-11 ENCOUNTER — Encounter (HOSPITAL_COMMUNITY)
Admission: RE | Admit: 2017-06-11 | Discharge: 2017-06-11 | Disposition: A | Discharge status: Home / Self Care | Payer: Medicare Other | Source: Ambulatory Visit | Attending: Cardiovascular Disease | Admitting: Cardiovascular Disease

## 2017-06-11 DIAGNOSIS — Z955 Presence of coronary angioplasty implant and graft: Secondary | ICD-10-CM | POA: Diagnosis not present

## 2017-06-11 DIAGNOSIS — I2102 ST elevation (STEMI) myocardial infarction involving left anterior descending coronary artery: Secondary | ICD-10-CM

## 2017-06-11 DIAGNOSIS — I2121 ST elevation (STEMI) myocardial infarction involving left circumflex coronary artery: Secondary | ICD-10-CM | POA: Diagnosis not present

## 2017-06-13 ENCOUNTER — Encounter (HOSPITAL_COMMUNITY)
Admission: RE | Admit: 2017-06-13 | Discharge: 2017-06-13 | Disposition: A | Discharge status: Home / Self Care | Payer: Medicare Other | Source: Ambulatory Visit | Attending: Cardiovascular Disease | Admitting: Cardiovascular Disease

## 2017-06-13 DIAGNOSIS — Z955 Presence of coronary angioplasty implant and graft: Secondary | ICD-10-CM | POA: Diagnosis not present

## 2017-06-13 DIAGNOSIS — I2121 ST elevation (STEMI) myocardial infarction involving left circumflex coronary artery: Secondary | ICD-10-CM | POA: Diagnosis not present

## 2017-06-13 DIAGNOSIS — I2102 ST elevation (STEMI) myocardial infarction involving left anterior descending coronary artery: Secondary | ICD-10-CM

## 2017-06-16 ENCOUNTER — Encounter (HOSPITAL_COMMUNITY)
Admission: RE | Admit: 2017-06-16 | Discharge: 2017-06-16 | Disposition: A | Discharge status: Home / Self Care | Payer: Medicare Other | Source: Ambulatory Visit | Attending: Cardiovascular Disease | Admitting: Cardiovascular Disease

## 2017-06-16 DIAGNOSIS — Z955 Presence of coronary angioplasty implant and graft: Secondary | ICD-10-CM | POA: Diagnosis not present

## 2017-06-16 DIAGNOSIS — I2102 ST elevation (STEMI) myocardial infarction involving left anterior descending coronary artery: Secondary | ICD-10-CM

## 2017-06-16 DIAGNOSIS — I2121 ST elevation (STEMI) myocardial infarction involving left circumflex coronary artery: Secondary | ICD-10-CM | POA: Diagnosis not present

## 2017-06-16 DIAGNOSIS — E785 Hyperlipidemia, unspecified: Secondary | ICD-10-CM | POA: Diagnosis not present

## 2017-06-16 LAB — HEPATIC FUNCTION PANEL
ALBUMIN: 4.2 g/dL (ref 3.6–5.1)
ALT: 38 U/L (ref 9–46)
AST: 33 U/L (ref 10–35)
Alkaline Phosphatase: 105 U/L (ref 40–115)
BILIRUBIN DIRECT: 0.2 mg/dL (ref <=0.2)
BILIRUBIN TOTAL: 0.9 mg/dL (ref 0.2–1.2)
Indirect Bilirubin: 0.7 mg/dL (ref 0.2–1.2)
Total Protein: 6.9 g/dL (ref 6.1–8.1)

## 2017-06-16 LAB — LIPID PANEL
CHOL/HDL RATIO: 2.9 ratio (ref <5.0)
Cholesterol: 114 mg/dL (ref <200)
HDL: 40 mg/dL — AB (ref >40)
LDL CALC: 52 mg/dL (ref <100)
Triglycerides: 111 mg/dL (ref <150)
VLDL: 22 mg/dL (ref <30)

## 2017-06-16 NOTE — Progress Notes (Signed)
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  aspirin EC 81 MG tablet, Take 1 tablet (81 mg total) by mouth daily., Disp: 90 tablet, Rfl: 3 .  atorvastatin (LIPITOR) 40 MG tablet, Take 1 tablet (40 mg total) by mouth daily at 6 PM., Disp: 30 tablet, Rfl: 6 .  metoprolol tartrate (LOPRESSOR) 25 MG tablet, Take 0.5 tablets (12.5 mg total) by mouth 2 (two) times daily., Disp: 30 tablet, Rfl: 6 .  nitroGLYCERIN (NITROSTAT) 0.4 MG SL tablet, Place 1 tablet (0.4 mg total) under the tongue every 5 (five) minutes as needed for chest pain., Disp: 25 tablet, Rfl: 3 .  pantoprazole (PROTONIX) 40 MG tablet, Take 1 tablet (40 mg total) by mouth daily., Disp: 30 tablet, Rfl: 6 .  ticagrelor (BRILINTA) 90 MG TABS tablet, Take 1 tablet (90 mg total) by mouth 2 (two) times daily., Disp: 60 tablet, Rfl: 11  Past Medical History: Past Medical History:  Diagnosis Date  . BPH (benign prostatic hyperplasia)   . Hyperlipidemia   . Melanoma ([place])   . Multiple myeloma ([facility])   . Pulmonary embolism ([place]) 06/15/2013  . Skin cancer (melanoma) ([facility])     Tobacco Use: History  Smoking Status  . Never Smoker  Smokeless Tobacco  . Never Used    Labs: Recent Review Flowsheet Data    Labs for ITP Cardiac and Pulmonary Rehab Latest Ref Rng & Units 10/15/2016 02/11/2017 02/12/2017 04/16/2017  06/16/2017   Cholestrol <200 mg/dL 210(H) - 197 119 114   LDLCALC <100 mg/dL - - 122(H) 59 52   LDLDIRECT mg/dL 146.0 - - - -   HDL >40 mg/dL 41.00 - 40(L) 33(L) 40(L)   Trlycerides <150 mg/dL 219.0(H) - 175(H) 137 111   Hemoglobin A1c 4.6 - 6.5 % 5.5 - - - -   TCO2 0 - 100 mmol/L - 25 - - -      Capillary Blood Glucose: Lab Results  Component Value Date   GLUCAP 94 05/23/2015   GLUCAP 100 (H) 09/25/2012     Exercise Target Goals:    Exercise Program Goal: Individual exercise prescription set with THRR, safety & activity barriers. Participant demonstrates ability to understand and report RPE using BORG scale, to self-measure pulse accurately, and to acknowledge the importance of the exercise prescription.  Exercise Prescription Goal: Starting with aerobic activity 30 plus minutes a day, 3 days per week for initial exercise prescription. Provide home exercise prescription and guidelines that participant acknowledges understanding prior to discharge.  Activity Barriers & Risk Stratification:     Activity Barriers & Cardiac Risk Stratification - 04/08/17 1346      Activity Barriers & Cardiac Risk Stratification   Activity Barriers Other (comment)   Comments R achilles tendon repair   Cardiac Risk Stratification High      6 Minute Walk:     6 Minute Walk  [facility] Mccarthy Name 04/08/17 1431         6 Minute Walk   Phase Initial     Distance 1708 feet     Walk Time 6 minutes     # of Rest Breaks 0     MPH 3.23     METS 3.7     RPE 7     VO2 Peak 12.96     Symptoms No     Resting HR 71 bpm     Resting BP 118/62     Max Ex. HR 102 bpm     Max Ex. BP 124/70     2 Minute Post BP 118/72        Oxygen Initial Assessment:   Oxygen Re-Evaluation:   Oxygen Discharge (Final Oxygen Re-Evaluation):   Initial Exercise Prescription:     Initial Exercise Prescription - 04/08/17 1400      Date of Initial Exercise RX and Referring Provider   Date 04/08/17   Referring  Provider Quay Burow MD     Treadmill   MPH 2.7   Grade 1   Minutes 10   METs 3.44     Bike   Level 1   Minutes 10   METs 2.99     NuStep   Level 3   SPM 80   Minutes 10   METs 2     Prescription Details   Frequency (times per week) 3   Duration Progress to 30 minutes of continuous aerobic without signs/symptoms of physical distress     Intensity   THRR 40-80% of Max Heartrate 61-122   Ratings of Perceived Exertion 11-13   Perceived Dyspnea 0-4     Progression   Progression Continue to progress workloads to maintain intensity without signs/symptoms of physical distress.     Resistance Training   Training Prescription Yes   Weight 3lbs   Reps 10-15      Perform Capillary Blood Glucose checks as needed.  Exercise Prescription Changes:      Exercise Prescription Changes    Row Name 05/21/17 1200 06/12/17 1500           Response to Exercise   Blood Pressure (Admit) 128/74 110/74      Blood Pressure (Exercise) 134/82 126/62      Blood Pressure (Exit) 116/60 110/60      Heart Rate (Admit) 70 bpm 72 bpm      Heart Rate (Exercise) 104 bpm 124 bpm      Heart Rate (Exit) 58 bpm 82 bpm      Rating of Perceived Exertion (Exercise) 11 12      Duration Progress to 45 minutes of aerobic exercise without signs/symptoms of physical distress Progress to 45 minutes of aerobic exercise without signs/symptoms of physical distress      Intensity THRR unchanged THRR unchanged        Progression   Progression Continue to progress workloads to maintain intensity without signs/symptoms of physical distress. Continue to progress workloads to maintain intensity without signs/symptoms of physical distress.      Average METs 4.5 5        Resistance Training   Training Prescription Yes Yes      Weight 8lb 10lb      Reps 10-15 10-15        Bike   Level 2 2      Minutes 10 10      METs 4.96 4.96        NuStep  Level 6 6      SPM 80 80      Minutes 10 10      METs  3.8 4.9        Rower   Level 7 7      Watts 37 43      Minutes 10 10      METs 4.8 5        Home Exercise Plan   Plans to continue exercise at Home (comment) Home (comment)      Frequency Add 3 additional days to program exercise sessions. Add 3 additional days to program exercise sessions.         Exercise Comments:      Exercise Comments    Row Name 04/16/17 1650 05/21/17 1121 06/12/17 1531       Exercise Comments Pt is off to a great start with exercise.  Reviewed METs and goals wiht pt.  reviewed goals with pt.        Exercise Goals and Review:      Exercise Goals    Row Name 04/08/17 1350 04/08/17 1435 04/08/17 1520         Exercise Goals   Increase Physical Activity Yes  -  -     Intervention Develop an individualized exercise prescription for aerobic and resistive training based on initial evaluation findings, risk stratification, comorbidities and participant's personal goals.;Provide advice, education, support and counseling about physical activity/exercise needs.  -  -     Expected Outcomes Achievement of increased cardiorespiratory fitness and enhanced flexibility, muscular endurance and strength shown through measurements of functional capacity and personal statement of participant.  -  -     Increase Strength and Stamina Yes -  Learn how to lift weights safely Yes     Intervention Provide advice, education, support and counseling about physical activity/exercise needs.;Develop an individualized exercise prescription for aerobic and resistive training based on initial evaluation findings, risk stratification, comorbidities and participant's personal goals.  Learn exercise and activity limitations and be able to lift weights safely  -  -     Expected Outcomes Achievement of increased cardiorespiratory fitness and enhanced flexibility, muscular endurance and strength shown through measurements of functional capacity and personal statement of participant.  -  -         Exercise Goals Re-Evaluation :     Exercise Goals Re-Evaluation    Row Name 04/16/17 1649 05/21/17 1120 06/12/17 1531         Exercise Goal Re-Evaluation   Exercise Goals Review Increase Physical Activity;Increase Strenth and Stamina Increase Physical Activity;Increase Strenth and Stamina Increase Physical Activity;Increase Strenth and Stamina     Comments Pt is doing well with exercise.  He states that he plays tennis 2-3 days/week and he walks at home everyday for 12mn/day. Pt is playing some tennis, when it's not too hot outside and he's walking and doing strength training on his off days from [facility] Pt continues to do well with exercise and he is still playing tennis 3xs/week with spouse and friends.      Expected Outcomes continue with exercicse Rx and increase workloads as tolerated in order to increase cardiorespiratory fitness continue with exercicse Rx and increase workloads as tolerated in order to increase cardiorespiratory fitness continue with exercicse Rx and increase workloads as tolerated in order to increase cardiorespiratory fitness         Discharge Exercise Prescription (Final Exercise Prescription Changes):     Exercise Prescription Changes -  06/12/17 1500      Response to Exercise   Blood Pressure (Admit) 110/74   Blood Pressure (Exercise) 126/62   Blood Pressure (Exit) 110/60   Heart Rate (Admit) 72 bpm   Heart Rate (Exercise) 124 bpm   Heart Rate (Exit) 82 bpm   Rating of Perceived Exertion (Exercise) 12   Duration Progress to 45 minutes of aerobic exercise without signs/symptoms of physical distress   Intensity THRR unchanged     Progression   Progression Continue to progress workloads to maintain intensity without signs/symptoms of physical distress.   Average METs 5     Resistance Training   Training Prescription Yes   Weight 10lb   Reps 10-15     Bike   Level 2   Minutes 10   METs 4.96     NuStep   Level 6   SPM 80   Minutes 10    METs 4.9     Rower   Level 7   Watts 43   Minutes 10   METs 5     Home Exercise Plan   Plans to continue exercise at Home (comment)   Frequency Add 3 additional days to program exercise sessions.      Nutrition:  Target Goals: Understanding of nutrition guidelines, daily intake of sodium <1557m, cholesterol <2046m calories 30% from fat and 7% or less from saturated fats, daily to have 5 or more servings of fruits and vegetables.  Biometrics:     Pre Biometrics - 04/08/17 1436      Pre Biometrics   Waist Circumference 41.5 inches   Hip Circumference 41 inches   Waist to Hip Ratio 1.01 %   Triceps Skinfold 23 mm   % Body Fat 30.2 %   Grip Strength 39 kg   Flexibility 7.75 in   Single Leg Stand 30 seconds       Nutrition Therapy Plan and Nutrition Goals:     Nutrition Therapy & Goals - 06/02/17 1115      Nutrition Therapy   Diet Therapeutic Lifestyle Changes     Personal Nutrition Goals   Nutrition Goal Wt loss of 1-2 lb/week to a wt loss goal of 6-24 lb [facility] regarding individualized specific dietary modifications aiming towards targeted core components such as weight, hypertension, lipid management, diabetes, heart failure and other comorbidities.   Expected Outcomes Short Term Goal: Understand basic principles of dietary content, such as calories, fat, sodium, cholesterol and nutrients.;Long Term Goal: Adherence to prescribed nutrition plan.      Nutrition Discharge: Nutrition Scores:     Nutrition Assessments - 06/02/17 1114      MEDFICTS Scores   Pre Score 15      Nutrition Goals Re-Evaluation:   Nutrition Goals Re-Evaluation:   Nutrition Goals Discharge (Final Nutrition Goals Re-Evaluation):   Psychosocial: Target Goals: Acknowledge presence or absence of significant depression and/or stress, maximize coping skills, provide positive support  system. Participant is able to verbalize types and ability to use techniques and skills needed for reducing stress and depression.  Initial Review & Psychosocial Screening:     Initial Psych Review & Screening - 04/08/17 16GuyYes     Barriers   Psychosocial barriers to participate in program There are no identifiable barriers or psychosocial needs.     Screening Interventions  Interventions Encouraged to exercise      Quality of Life Scores:     Quality of Life - 04/08/17 1510      Quality of Life Scores   Health/Function Pre 27.6 %   Socioeconomic Pre 27.21 %   Psych/Spiritual Pre 27 %   Family Pre 28.8 %   GLOBAL Pre 27.59 %      PHQ-9: Recent Review Flowsheet Data    Depression screen [facility] 2/9 04/14/2017 02/13/2015 10/18/2013   Decreased Interest 0 0 0   Down, Depressed, Hopeless 0 0 0   PHQ - 2 Score 0 0 0     Interpretation of Total Score  Total Score Depression Severity:  1-4 = Minimal depression, 5-9 = Mild depression, 10-14 = Moderate depression, 15-19 = Moderately severe depression, 20-27 = Severe depression   Psychosocial Evaluation and Intervention:   Psychosocial Re-Evaluation:     Psychosocial Re-Evaluation    [place] Name 04/22/17 0905 05/19/17 1652 06/16/17 1302         Psychosocial Re-Evaluation   Current issues with None Identified None Identified None Identified     Interventions Encouraged to attend Cardiac Rehabilitation for the exercise Encouraged to attend Cardiac Rehabilitation for the exercise Encouraged to attend Cardiac Rehabilitation for the exercise     Continue Psychosocial Services  No Follow up required No Follow up required No Follow up required        Psychosocial Discharge (Final Psychosocial Re-Evaluation):     Psychosocial Re-Evaluation - 06/16/17 1302      Psychosocial Re-Evaluation   Current issues with None Identified   Interventions Encouraged to attend Cardiac  Rehabilitation for the exercise   Continue Psychosocial Services  No Follow up required      Vocational Rehabilitation: Provide vocational rehab assistance to qualifying candidates.   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      Education: Education Goals: Education classes will be provided on a weekly basis, covering required topics. Participant will state understanding/return demonstration of topics presented.  Learning Barriers/Preferences:     Learning Barriers/Preferences - 04/08/17 1346      Learning Barriers/Preferences   Learning Barriers Sight   Learning Preferences Skilled Demonstration      Education Topics: Count Your Pulse:  -Group instruction provided by verbal instruction, demonstration, patient participation and written materials to support subject.  Instructors address importance of being able to find your pulse and how to count your pulse when at home without a heart monitor.  Patients get hands on experience counting their pulse with staff help and individually.   CARDIAC REHAB PHASE II EXERCISE from 05/21/2017 in [place]  Date  05/02/17  Instruction Review Code  2- meets goals/outcomes      Heart Attack, Angina, and Risk Factor Modification:  -Group instruction provided by verbal instruction, video, and written materials to support subject.  Instructors address signs and symptoms of angina and heart attacks.    Also discuss risk factors for heart disease and how to make changes to improve heart health risk factors.   Functional Fitness:  -Group instruction provided by verbal instruction, demonstration, patient participation, and written materials to support subject.  Instructors  address safety measures for doing things around the  house.  Discuss how to get up and down off the floor, how to pick things up properly, how to safely get out of a chair without assistance, and balance training.   Meditation and Mindfulness:  -Group instruction provided by verbal instruction, patient participation, and written materials to support subject.  Instructor addresses importance of mindfulness and meditation practice to help reduce stress and improve awareness.  Instructor also leads participants through a meditation exercise.    Stretching for Flexibility and Mobility:  -Group instruction provided by verbal instruction, patient participation, and written materials to support subject.  Instructors lead participants through series of stretches that are designed to increase flexibility thus improving mobility.  These stretches are additional exercise for major muscle groups that are typically performed during regular warm up and cool down.   Hands Only CPR:  -Group verbal, video, and participation provides a basic overview of AHA guidelines for community CPR. Role-play of emergencies allow participants the opportunity to practice calling for help and chest compression technique with discussion of AED use.   Hypertension: -Group verbal and written instruction that provides a basic overview of hypertension including the most recent diagnostic guidelines, risk factor reduction with self-care instructions and medication management.    Nutrition I class: Heart Healthy Eating:  -Group instruction provided by PowerPoint slides, verbal discussion, and written materials to support subject matter. The instructor gives an explanation and review of the Therapeutic Lifestyle Changes diet recommendations, which includes a discussion on lipid goals, dietary fat, sodium, fiber, plant stanol/sterol esters, sugar, and the components of a well-balanced, healthy diet.   CARDIAC REHAB PHASE II EXERCISE  from 05/21/2017 in [place]  Date  05/06/17  Educator  RD  Instruction Review Code  2- meets goals/outcomes      Nutrition II class: Lifestyle Skills:  -Group instruction provided by PowerPoint slides, verbal discussion, and written materials to support subject matter. The instructor gives an explanation and review of label reading, grocery shopping for heart health, heart healthy recipe modifications, and ways to make healthier choices when eating out.   CARDIAC REHAB PHASE II EXERCISE from 05/21/2017 in [place]  Date  05/13/17  Educator  RD  Instruction Review Code  2- meets goals/outcomes      Diabetes Question & Answer:  -Group instruction provided by PowerPoint slides, verbal discussion, and written materials to support subject matter. The instructor gives an explanation and review of diabetes co-morbidities, pre- and post-prandial blood glucose goals, pre-exercise blood glucose goals, signs, symptoms, and treatment of hypoglycemia and hyperglycemia, and foot care basics.   Diabetes Blitz:  -Group instruction provided by PowerPoint slides, verbal discussion, and written materials to support subject matter. The instructor gives an explanation and review of the physiology behind type 1 and type 2 diabetes, diabetes medications and rational behind using different medications, pre- and post-prandial blood glucose recommendations and Hemoglobin A1c goals, diabetes diet, and exercise including blood glucose guidelines for exercising safely.    Portion Distortion:  -Group instruction provided by PowerPoint slides, verbal discussion, written materials, and food models to support subject matter. The instructor gives an explanation of serving size versus portion size, changes in portions sizes over the last 20 years, and what consists of a serving from each food group.   CARDIAC REHAB PHASE II EXERCISE from 05/21/2017 in [place]  Date  05/21/17  Educator  RD  Instruction Review Code  2- meets goals/outcomes  Stress Management:  -Group instruction provided by verbal instruction, video, and written materials to support subject matter.  Instructors review role of stress in heart disease and how to cope with stress positively.     Exercising on Your Own:  -Group instruction provided by verbal instruction, power point, and written materials to support subject.  Instructors discuss benefits of exercise, components of exercise, frequency and intensity of exercise, and end points for exercise.  Also discuss use of nitroglycerin and activating EMS.  Review options of places to exercise outside of rehab.  Review guidelines for sex with heart disease.   Cardiac Drugs I:  -Group instruction provided by verbal instruction and written materials to support subject.  Instructor reviews cardiac drug classes: antiplatelets, anticoagulants, beta blockers, and statins.  Instructor discusses reasons, side effects, and lifestyle considerations for each drug class.   Cardiac Drugs II:  -Group instruction provided by verbal instruction and written materials to support subject.  Instructor reviews cardiac drug classes: angiotensin converting enzyme inhibitors (ACE-I), angiotensin II receptor blockers (ARBs), nitrates, and calcium channel blockers.  Instructor discusses reasons, side effects, and lifestyle considerations for each drug class.   Anatomy and Physiology of the Circulatory System:  Group verbal and written instruction and models provide basic cardiac anatomy and physiology, with the coronary electrical and arterial systems. Review of: AMI, Angina, Valve disease, Heart Failure, Peripheral Artery Disease, Cardiac Arrhythmia, Pacemakers, and the ICD.   CARDIAC REHAB PHASE II EXERCISE from 05/21/2017 in [place]  Date  04/30/17  Instruction Review Code  2-  meets goals/outcomes      Other Education:  -Group or individual verbal, written, or video instructions that support the educational goals of the cardiac rehab program.   Knowledge Questionnaire Score:     Knowledge Questionnaire Score - 04/08/17 1341      Knowledge Questionnaire Score   Pre Score 21/24      Core Components/Risk Factors/Patient Goals at Admission:     Personal Goals and Risk Factors at Admission - 04/08/17 1436      Core Components/Risk Factors/Patient Goals on Admission   Lipids Yes   Intervention Provide education and support for participant on nutrition & aerobic/resistive exercise along with prescribed medications to achieve LDL <47m, HDL >437m   Expected Outcomes Short Term: Participant states understanding of desired cholesterol values and is compliant with medications prescribed. Participant is following exercise prescription and nutrition guidelines.;Long Term: Cholesterol controlled with medications as prescribed, with individualized exercise RX and with personalized nutrition plan. Value goals: LDL < 7017mHDL > 40 mg.      Core Components/Risk Factors/Patient Goals Review:    Core Components/Risk Factors/Patient Goals at Discharge (Final Review):    ITP Comments:     ITP Comments    Row Name 04/08/17 1341           ITP Comments Medical Director, Dr. TraFransico Him        Comments:Maximum is making expected progress toward personal goals after completing 24 sessions. Recommend continued exercise and life style modification education including  stress management and relaxation techniques to decrease cardiac risk profile. Adell continues to do well with exercise.MarBarnet PallN,BSN 06/17/2017 1:56 PM

## 2017-06-18 ENCOUNTER — Encounter (HOSPITAL_COMMUNITY)
Admission: RE | Admit: 2017-06-18 | Discharge: 2017-06-18 | Disposition: A | Discharge status: Home / Self Care | Payer: Medicare Other | Source: Ambulatory Visit | Attending: Cardiovascular Disease | Admitting: Cardiovascular Disease

## 2017-06-18 DIAGNOSIS — I2102 ST elevation (STEMI) myocardial infarction involving left anterior descending coronary artery: Secondary | ICD-10-CM

## 2017-06-18 DIAGNOSIS — Z955 Presence of coronary angioplasty implant and graft: Secondary | ICD-10-CM | POA: Diagnosis not present

## 2017-06-18 DIAGNOSIS — I2121 ST elevation (STEMI) myocardial infarction involving left circumflex coronary artery: Secondary | ICD-10-CM | POA: Diagnosis not present

## 2017-06-20 ENCOUNTER — Encounter (HOSPITAL_COMMUNITY)
Admission: RE | Admit: 2017-06-20 | Discharge: 2017-06-20 | Disposition: A | Discharge status: Home / Self Care | Payer: Medicare Other | Source: Ambulatory Visit | Attending: Cardiovascular Disease | Admitting: Cardiovascular Disease

## 2017-06-20 DIAGNOSIS — I2121 ST elevation (STEMI) myocardial infarction involving left circumflex coronary artery: Secondary | ICD-10-CM | POA: Diagnosis not present

## 2017-06-20 DIAGNOSIS — I2102 ST elevation (STEMI) myocardial infarction involving left anterior descending coronary artery: Secondary | ICD-10-CM

## 2017-06-20 DIAGNOSIS — Z955 Presence of coronary angioplasty implant and graft: Secondary | ICD-10-CM | POA: Diagnosis not present

## 2017-06-23 ENCOUNTER — Encounter (HOSPITAL_COMMUNITY)
Admission: RE | Admit: 2017-06-23 | Discharge: 2017-06-23 | Disposition: A | Discharge status: Home / Self Care | Payer: Medicare Other | Source: Ambulatory Visit | Attending: Cardiovascular Disease | Admitting: Cardiovascular Disease

## 2017-06-23 DIAGNOSIS — I2121 ST elevation (STEMI) myocardial infarction involving left circumflex coronary artery: Secondary | ICD-10-CM | POA: Diagnosis not present

## 2017-06-23 DIAGNOSIS — Z955 Presence of coronary angioplasty implant and graft: Secondary | ICD-10-CM | POA: Diagnosis not present

## 2017-06-23 DIAGNOSIS — I2102 ST elevation (STEMI) myocardial infarction involving left anterior descending coronary artery: Secondary | ICD-10-CM

## 2017-06-24 ENCOUNTER — Other Ambulatory Visit: Payer: Self-pay

## 2017-06-24 ENCOUNTER — Ambulatory Visit (HOSPITAL_COMMUNITY): Payer: Medicare Other | Attending: Cardiology

## 2017-06-24 DIAGNOSIS — I080 Rheumatic disorders of both mitral and aortic valves: Secondary | ICD-10-CM | POA: Insufficient documentation

## 2017-06-24 DIAGNOSIS — I2102 ST elevation (STEMI) myocardial infarction involving left anterior descending coronary artery: Secondary | ICD-10-CM

## 2017-06-24 DIAGNOSIS — I2510 Atherosclerotic heart disease of native coronary artery without angina pectoris: Secondary | ICD-10-CM

## 2017-06-24 DIAGNOSIS — E785 Hyperlipidemia, unspecified: Secondary | ICD-10-CM | POA: Diagnosis not present

## 2017-06-24 DIAGNOSIS — Z9861 Coronary angioplasty status: Secondary | ICD-10-CM

## 2017-06-24 DIAGNOSIS — I272 Pulmonary hypertension, unspecified: Secondary | ICD-10-CM | POA: Diagnosis not present

## 2017-06-25 ENCOUNTER — Encounter (HOSPITAL_COMMUNITY)
Admission: RE | Admit: 2017-06-25 | Discharge: 2017-06-25 | Disposition: A | Discharge status: Home / Self Care | Payer: Medicare Other | Source: Ambulatory Visit | Attending: Cardiovascular Disease | Admitting: Cardiovascular Disease

## 2017-06-25 DIAGNOSIS — I2121 ST elevation (STEMI) myocardial infarction involving left circumflex coronary artery: Secondary | ICD-10-CM | POA: Diagnosis not present

## 2017-06-25 DIAGNOSIS — I2102 ST elevation (STEMI) myocardial infarction involving left anterior descending coronary artery: Secondary | ICD-10-CM

## 2017-06-25 DIAGNOSIS — Z955 Presence of coronary angioplasty implant and graft: Secondary | ICD-10-CM | POA: Diagnosis not present

## 2017-06-27 ENCOUNTER — Encounter (HOSPITAL_COMMUNITY)
Admission: RE | Admit: 2017-06-27 | Discharge: 2017-06-27 | Disposition: A | Discharge status: Home / Self Care | Payer: Medicare Other | Source: Ambulatory Visit | Attending: Cardiovascular Disease | Admitting: Cardiovascular Disease

## 2017-06-27 DIAGNOSIS — Z955 Presence of coronary angioplasty implant and graft: Secondary | ICD-10-CM | POA: Diagnosis not present

## 2017-06-27 DIAGNOSIS — I2121 ST elevation (STEMI) myocardial infarction involving left circumflex coronary artery: Secondary | ICD-10-CM | POA: Diagnosis not present

## 2017-06-27 DIAGNOSIS — I2102 ST elevation (STEMI) myocardial infarction involving left anterior descending coronary artery: Secondary | ICD-10-CM

## 2017-06-30 ENCOUNTER — Encounter (HOSPITAL_COMMUNITY)
Admission: RE | Admit: 2017-06-30 | Discharge: 2017-06-30 | Disposition: A | Discharge status: Home / Self Care | Payer: Medicare Other | Source: Ambulatory Visit | Attending: Cardiovascular Disease | Admitting: Cardiovascular Disease

## 2017-06-30 DIAGNOSIS — I2102 ST elevation (STEMI) myocardial infarction involving left anterior descending coronary artery: Secondary | ICD-10-CM

## 2017-06-30 DIAGNOSIS — I2121 ST elevation (STEMI) myocardial infarction involving left circumflex coronary artery: Secondary | ICD-10-CM | POA: Diagnosis not present

## 2017-06-30 DIAGNOSIS — Z955 Presence of coronary angioplasty implant and graft: Secondary | ICD-10-CM | POA: Diagnosis not present

## 2017-07-02 ENCOUNTER — Encounter (HOSPITAL_COMMUNITY)
Admission: RE | Admit: 2017-07-02 | Discharge: 2017-07-02 | Disposition: A | Discharge status: Home / Self Care | Payer: Medicare Other | Source: Ambulatory Visit | Attending: Cardiovascular Disease | Admitting: Cardiovascular Disease

## 2017-07-02 DIAGNOSIS — I2102 ST elevation (STEMI) myocardial infarction involving left anterior descending coronary artery: Secondary | ICD-10-CM

## 2017-07-02 DIAGNOSIS — I2121 ST elevation (STEMI) myocardial infarction involving left circumflex coronary artery: Secondary | ICD-10-CM | POA: Insufficient documentation

## 2017-07-02 DIAGNOSIS — Z955 Presence of coronary angioplasty implant and graft: Secondary | ICD-10-CM | POA: Insufficient documentation

## 2017-07-04 ENCOUNTER — Encounter (HOSPITAL_COMMUNITY)
Admission: RE | Admit: 2017-07-04 | Discharge: 2017-07-04 | Disposition: A | Discharge status: Home / Self Care | Payer: Medicare Other | Source: Ambulatory Visit | Attending: Cardiovascular Disease | Admitting: Cardiovascular Disease

## 2017-07-04 DIAGNOSIS — Z955 Presence of coronary angioplasty implant and graft: Secondary | ICD-10-CM | POA: Diagnosis not present

## 2017-07-04 DIAGNOSIS — I2121 ST elevation (STEMI) myocardial infarction involving left circumflex coronary artery: Secondary | ICD-10-CM | POA: Diagnosis not present

## 2017-07-04 DIAGNOSIS — I2102 ST elevation (STEMI) myocardial infarction involving left anterior descending coronary artery: Secondary | ICD-10-CM

## 2017-07-07 ENCOUNTER — Encounter (HOSPITAL_COMMUNITY)
Admission: RE | Admit: 2017-07-07 | Discharge: 2017-07-07 | Disposition: A | Discharge status: Home / Self Care | Payer: Medicare Other | Source: Ambulatory Visit | Attending: Cardiovascular Disease | Admitting: Cardiovascular Disease

## 2017-07-07 DIAGNOSIS — I2102 ST elevation (STEMI) myocardial infarction involving left anterior descending coronary artery: Secondary | ICD-10-CM

## 2017-07-07 DIAGNOSIS — I2121 ST elevation (STEMI) myocardial infarction involving left circumflex coronary artery: Secondary | ICD-10-CM | POA: Diagnosis not present

## 2017-07-07 DIAGNOSIS — Z955 Presence of coronary angioplasty implant and graft: Secondary | ICD-10-CM | POA: Diagnosis not present

## 2017-07-09 ENCOUNTER — Encounter (HOSPITAL_COMMUNITY)
Admission: RE | Admit: 2017-07-09 | Discharge: 2017-07-09 | Disposition: A | Discharge status: Home / Self Care | Payer: Medicare Other | Source: Ambulatory Visit | Attending: Cardiovascular Disease | Admitting: Cardiovascular Disease

## 2017-07-09 DIAGNOSIS — Z955 Presence of coronary angioplasty implant and graft: Secondary | ICD-10-CM | POA: Diagnosis not present

## 2017-07-09 DIAGNOSIS — I2121 ST elevation (STEMI) myocardial infarction involving left circumflex coronary artery: Secondary | ICD-10-CM | POA: Diagnosis not present

## 2017-07-09 DIAGNOSIS — I2102 ST elevation (STEMI) myocardial infarction involving left anterior descending coronary artery: Secondary | ICD-10-CM

## 2017-07-11 ENCOUNTER — Encounter (HOSPITAL_COMMUNITY)
Admission: RE | Admit: 2017-07-11 | Discharge: 2017-07-11 | Disposition: A | Discharge status: Home / Self Care | Payer: Medicare Other | Source: Ambulatory Visit | Attending: Cardiovascular Disease | Admitting: Cardiovascular Disease

## 2017-07-11 VITALS — Ht 71.0 in | Wt 204.4 lb

## 2017-07-11 DIAGNOSIS — Z955 Presence of coronary angioplasty implant and graft: Secondary | ICD-10-CM | POA: Diagnosis not present

## 2017-07-11 DIAGNOSIS — I2121 ST elevation (STEMI) myocardial infarction involving left circumflex coronary artery: Secondary | ICD-10-CM | POA: Diagnosis not present

## 2017-07-11 DIAGNOSIS — I2102 ST elevation (STEMI) myocardial infarction involving left anterior descending coronary artery: Secondary | ICD-10-CM

## 2017-07-11 NOTE — Progress Notes (Signed)
Discharge Summary  Patient Details  Name: John Mccarthy MRN: [id number] Date of Birth: 03/20/1951 Referring Provider:     [facility] from 04/08/2017 in [place]  Referring Provider  John Burow MD       Number of Visits: 36  Reason for Discharge:  Patient independent in their exercise.  Smoking History:  History  Smoking Status  . Never Smoker  Smokeless Tobacco  . Never Used    Diagnosis:  02/11/17 ST elevation myocardial infarction involving left anterior descending (LAD) coronary artery (HCC)  ADL UCSD:   Initial Exercise Prescription:     Initial Exercise Prescription - 04/08/17 1400      Date of Initial Exercise RX and Referring Provider   Date 04/08/17   Referring Provider John Burow MD     Treadmill   MPH 2.7   Grade 1   Minutes 10   METs 3.44     Bike   Level 1   Minutes 10   METs 2.99     NuStep   Level 3   SPM 80   Minutes 10   METs 2     Prescription Details   Frequency (times per week) 3   Duration Progress to 30 minutes of continuous aerobic without signs/symptoms of physical distress     Intensity   THRR 40-80% of Max Heartrate 61-122   Ratings of Perceived Exertion 11-13   Perceived Dyspnea 0-4     Progression   Progression Continue to progress workloads to maintain intensity without signs/symptoms of physical distress.     Resistance Training   Training Prescription Yes   Weight 3lbs   Reps 10-15      Discharge Exercise Prescription (Final Exercise Prescription Changes):     Exercise Prescription Changes - 07/11/17 1300      Response to Exercise   Blood Pressure (Admit) 106/68   Blood Pressure (Exercise) 124/60   Blood Pressure (Exit) 122/60   Heart Rate (Admit) 70 bpm   Heart Rate (Exercise) 100 bpm   Heart Rate (Exit) 70 bpm   Rating of Perceived Exertion (Exercise) 11   Duration Progress to 45 minutes of aerobic exercise without  signs/symptoms of physical distress   Intensity THRR unchanged     Progression   Progression Continue to progress workloads to maintain intensity without signs/symptoms of physical distress.   Average METs 5.3     Resistance Training   Training Prescription Yes   Weight 9lb   Reps 10-15     Bike   Level 2.8   Minutes 10   METs 6.7     NuStep   Level 6   SPM 80   Minutes 10   METs 4.1     Rower   Level 7   Watts 43   Minutes 10   METs 5     Home Exercise Plan   Plans to continue exercise at Home (comment)   Frequency Add 3 additional days to program exercise sessions.      Functional Capacity:     6 Minute Walk    Row Name 04/08/17 1431 06/27/17 1006       6 Minute Walk   Phase Initial Discharge    Distance 1708 feet 1975 feet    Distance % Change  - 15.63 %    Walk Time 6 minutes 6 minutes    # of Rest Breaks 0 0    MPH 3.23 3.7  METS 3.7 4.7    RPE 7 10    VO2 Peak 12.96 16.4    Symptoms No No    Resting HR 71 bpm 73 bpm    Resting BP 118/62 116/68    Max Ex. HR 102 bpm 117 bpm    Max Ex. BP 124/70 166/86    2 Minute Post BP 118/72  -       Psychological, QOL, Others - Outcomes: PHQ 2/9: Depression screen [facility] 2/9 04/14/2017 02/13/2015 10/18/2013  Decreased Interest 0 0 0  Down, Depressed, Hopeless 0 0 0  PHQ - 2 Score 0 0 0    Quality of Life:     Quality of Life - 08/05/17 1129      Quality of Life Scores   Health/Function Pre 27.6 %   Health/Function Post 29.6 %   Health/Function % Change 7.25 %   Socioeconomic Pre 27.21 %   Socioeconomic Post 28.44 %   Socioeconomic % Change  4.52 %   Psych/Spiritual Pre 27 %   Psych/Spiritual Post 30 %   Psych/Spiritual % Change 11.11 %   Family Pre 28.8 %   Family Post 30 %   Family % Change 4.17 %   GLOBAL Pre 27.59 %   GLOBAL Post 29.47 %   GLOBAL % Change 6.81 %      Personal Goals: Goals established at orientation with interventions provided to work toward goal.     Personal  Goals and Risk Factors at Admission - 04/08/17 1436      Core Components/Risk Factors/Patient Goals on Admission   Lipids Yes   Intervention Provide education and support for participant on nutrition & aerobic/resistive exercise along with prescribed medications to achieve LDL <60m, HDL >4106m   Expected Outcomes Short Term: Participant states understanding of desired cholesterol values and is compliant with medications prescribed. Participant is following exercise prescription and nutrition guidelines.;Long Term: Cholesterol controlled with medications as prescribed, with individualized exercise RX and with personalized nutrition plan. Value goals: LDL < 7068mHDL > 40 mg.       Personal Goals Discharge:     Goals and Risk Factor Review    Row Name 08/06/17 1715 08/06/17 1716           Core Components/Risk Factors/Patient Goals Review   Personal Goals Review Lipids Lipids      Review  - -  John Mccarthy continues to take his statin as presribed      Expected Outcomes  - -  John Mccarthy will continue to exercise and watch his diet         Nutrition & Weight - Outcomes:     Pre Biometrics - 04/08/17 1436      Pre Biometrics   Waist Circumference 41.5 inches   Hip Circumference 41 inches   Waist to Hip Ratio 1.01 %   Triceps Skinfold 23 mm   % Body Fat 30.2 %   Grip Strength 39 kg   Flexibility 7.75 in   Single Leg Stand 30 seconds         Post Biometrics - 07/11/17 0945       Post  Biometrics   Height 5' 11"  (1.803 m)   Weight 204 lb 5.9 oz (92.7 kg)   Waist Circumference 42.25 inches   Hip Circumference 39.5 inches   Waist to Hip Ratio 1.07 %   BMI (Calculated) 28.52   Triceps Skinfold 19 mm   % Body Fat 29.6 %   Grip Strength 44.5  kg   Flexibility 7.5 in   Single Leg Stand 23.2 seconds      Nutrition:     Nutrition Therapy & Goals - 06/02/17 1115      Nutrition Therapy   Diet Therapeutic Lifestyle Changes     Personal Nutrition Goals   Nutrition Goal Wt  loss of 1-2 lb/week to a wt loss goal of 6-24 lb at graduation from [place], educate and counsel regarding individualized specific dietary modifications aiming towards targeted core components such as weight, hypertension, lipid management, diabetes, heart failure and other comorbidities.   Expected Outcomes Short Term Goal: Understand basic principles of dietary content, such as calories, fat, sodium, cholesterol and nutrients.;Long Term Goal: Adherence to prescribed nutrition plan.      Nutrition Discharge:     Nutrition Assessments - 08/06/17 0803      MEDFICTS Scores   Pre Score 15   Post Score 6   Score Difference -9      Education Questionnaire Score:     Knowledge Questionnaire Score - 08/05/17 1128      Knowledge Questionnaire Score   Pre Score 21/24   Post Score 24/24      Goals reviewed with patient; copy given to patient.Taysom graduated from cardiac rehab program today with completion of 36 exercise sessions in Phase II. Pt maintained good attendance and progressed nicely during his participation in rehab as evidenced by increased MET level.   Medication list reconciled. Repeat  PHQ score-  0.  Pt has made significant lifestyle changes and should be commended for his success. Pt feels he has achieved his goals during cardiac rehab.   Pt plans to continue exercise by walking at home.[name], RN,BSN 08/06/2017 5:19 PM

## 2017-07-14 ENCOUNTER — Encounter (HOSPITAL_COMMUNITY)
Admission: RE | Admit: 2017-07-14 | Discharge: 2017-07-14 | Disposition: A | Discharge status: Home / Self Care | Payer: Medicare Other | Source: Ambulatory Visit | Attending: Cardiovascular Disease | Admitting: Cardiovascular Disease

## 2017-07-16 ENCOUNTER — Encounter (HOSPITAL_COMMUNITY): Payer: Medicare Other

## 2017-08-05 NOTE — Addendum Note (Signed)
Encounter addended by: Sol Passer on: 08/05/2017 11:31 AM<BR>    Actions taken: Flowsheet data copied forward, Visit Navigator Flowsheet section accepted

## 2017-08-06 NOTE — Addendum Note (Signed)
Encounter addended by: Magda Kiel, RN on: 08/06/2017  5:20 PM<BR>    Actions taken: Order Reconciliation Section accessed, Visit Navigator Flowsheet section accepted, Sign clinical note

## 2017-08-06 NOTE — Addendum Note (Signed)
Encounter addended by: Jewel Baize, RD on: 08/06/2017  8:07 AM<BR>    Actions taken: Flowsheet data copied forward, Visit Navigator Flowsheet section accepted

## 2017-08-25 ENCOUNTER — Other Ambulatory Visit: Payer: Self-pay | Admitting: Physician Assistant

## 2017-10-16 ENCOUNTER — Other Ambulatory Visit (INDEPENDENT_AMBULATORY_CARE_PROVIDER_SITE_OTHER): Payer: Medicare Other

## 2017-10-16 ENCOUNTER — Ambulatory Visit (INDEPENDENT_AMBULATORY_CARE_PROVIDER_SITE_OTHER): Payer: Medicare Other | Admitting: Internal Medicine

## 2017-10-16 ENCOUNTER — Encounter: Payer: Self-pay | Admitting: Internal Medicine

## 2017-10-16 VITALS — BP 126/80 | HR 61 | Temp 98.2°F | Ht 71.0 in | Wt 201.0 lb

## 2017-10-16 DIAGNOSIS — N509 Disorder of male genital organs, unspecified: Secondary | ICD-10-CM

## 2017-10-16 DIAGNOSIS — K8681 Exocrine pancreatic insufficiency: Secondary | ICD-10-CM | POA: Diagnosis not present

## 2017-10-16 DIAGNOSIS — Z23 Encounter for immunization: Secondary | ICD-10-CM

## 2017-10-16 DIAGNOSIS — R197 Diarrhea, unspecified: Secondary | ICD-10-CM | POA: Diagnosis not present

## 2017-10-16 DIAGNOSIS — N5089 Other specified disorders of the male genital organs: Secondary | ICD-10-CM | POA: Insufficient documentation

## 2017-10-16 DIAGNOSIS — E785 Hyperlipidemia, unspecified: Secondary | ICD-10-CM

## 2017-10-16 DIAGNOSIS — N400 Enlarged prostate without lower urinary tract symptoms: Secondary | ICD-10-CM

## 2017-10-16 DIAGNOSIS — I255 Ischemic cardiomyopathy: Secondary | ICD-10-CM | POA: Diagnosis not present

## 2017-10-16 LAB — CBC WITH DIFFERENTIAL/PLATELET
BASOS ABS: 0.1 10*3/uL (ref 0.0–0.1)
Basophils Relative: 0.9 % (ref 0.0–3.0)
EOS ABS: 0.1 10*3/uL (ref 0.0–0.7)
Eosinophils Relative: 1.1 % (ref 0.0–5.0)
HCT: 48.7 % (ref 39.0–52.0)
HEMOGLOBIN: 16 g/dL (ref 13.0–17.0)
LYMPHS ABS: 1.2 10*3/uL (ref 0.7–4.0)
Lymphocytes Relative: 15.6 % (ref 12.0–46.0)
MCHC: 32.9 g/dL (ref 30.0–36.0)
MCV: 93 fl (ref 78.0–100.0)
MONO ABS: 0.5 10*3/uL (ref 0.1–1.0)
Monocytes Relative: 6.6 % (ref 3.0–12.0)
NEUTROS PCT: 75.8 % (ref 43.0–77.0)
Neutro Abs: 5.9 10*3/uL (ref 1.4–7.7)
Platelets: 226 10*3/uL (ref 150.0–400.0)
RBC: 5.24 Mil/uL (ref 4.22–5.81)
RDW: 14.1 % (ref 11.5–15.5)
WBC: 7.8 10*3/uL (ref 4.0–10.5)

## 2017-10-16 LAB — COMPREHENSIVE METABOLIC PANEL
ALBUMIN: 4.3 g/dL (ref 3.5–5.2)
ALK PHOS: 96 U/L (ref 39–117)
ALT: 26 U/L (ref 0–53)
AST: 27 U/L (ref 0–37)
BILIRUBIN TOTAL: 1 mg/dL (ref 0.2–1.2)
BUN: 13 mg/dL (ref 6–23)
CO2: 27 mEq/L (ref 19–32)
CREATININE: 0.97 mg/dL (ref 0.40–1.50)
Calcium: 9.6 mg/dL (ref 8.4–10.5)
Chloride: 103 mEq/L (ref 96–112)
GFR: 81.7 mL/min (ref >60.00)
GLUCOSE: 105 mg/dL — AB (ref 70–99)
POTASSIUM: 4.1 meq/L (ref 3.5–5.1)
SODIUM: 141 meq/L (ref 135–145)
TOTAL PROTEIN: 7.1 g/dL (ref 6.0–8.3)

## 2017-10-16 LAB — PSA: PSA: 0.73 ng/mL (ref 0.10–4.00)

## 2017-10-16 LAB — SEDIMENTATION RATE: SED RATE: 15 mm/h (ref 0–20)

## 2017-10-16 MED ORDER — PANCRELIPASE (LIP-PROT-AMYL) 40000-126000 UNITS PO CPEP: 1.0000 | ORAL_CAPSULE | Freq: Four times a day (QID) | ORAL | 3 refills | Status: DC

## 2017-10-16 NOTE — Patient Instructions (Signed)

## 2017-10-16 NOTE — Progress Notes (Signed)
Subjective:  Patient ID: John Mccarthy, male    DOB: 28-Dec-1950  Age: 66 y.o. MRN: [id number]  CC: Diarrhea   HPI John Mccarthy presents for concerns about a 1 year history of intermittent watery diarrhea.  He has up to 6 watery bowel movements a day.  He has adjusted his diet and changed his medications with no improvement.  He tells me that dairy products make it a little worse but when he is dairy free the diarrhea does not resolve.  He does not know if it is related to foods that contain gluten.  He denies abdominal pain, bloody stool, loss of appetite, or weight loss.  He also complains of a one year history of right testicular pain.  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  Outpatient Medications Prior to Visit  Medication Sig Dispense Refill  . aspirin EC 81 MG tablet Take 1 tablet (81 mg total) by mouth daily. 90 tablet 3  . atorvastatin (LIPITOR) 40 MG tablet Take 1 tablet (40 mg total) by mouth daily at 6 PM. 30 tablet 6  . ticagrelor (BRILINTA) 90 MG TABS tablet Take 1 tablet (90 mg total) by mouth 2 (two) times daily. 60 tablet 11  . nitroGLYCERIN (NITROSTAT) 0.4 MG SL tablet Place 1 tablet (0.4 mg total) under the tongue every 5 (five) minutes as needed for chest pain. 25 tablet 3  . metoprolol tartrate (LOPRESSOR) 25 MG tablet TAKE 1/2 TABLET TWICE A DAY 30 tablet 6  . pantoprazole (PROTONIX) 40 MG tablet Take 1 tablet (40 mg total) by mouth daily. (Patient not taking: Reported on 07/11/2017) 30 tablet 6   No facility-administered medications prior to visit.     ROS Review of Systems  Constitutional: Negative.  Negative for appetite change, diaphoresis, fatigue and unexpected weight change.  HENT: Negative.  Negative for sinus pressure and trouble swallowing.   Eyes: Negative.   Respiratory: Negative.  Negative for cough, chest tightness, shortness of breath and wheezing.     Cardiovascular: Negative for chest pain, palpitations and leg swelling.  Gastrointestinal: Negative for abdominal pain, constipation, diarrhea, nausea and vomiting.  Endocrine: Negative.   Genitourinary: Positive for scrotal swelling and testicular pain. Negative for decreased urine volume, difficulty urinating, discharge, flank pain, penile pain, penile swelling and urgency.  Musculoskeletal: Negative.   Skin: Negative.  Negative for color change and rash.  Allergic/Immunologic: Negative.   Neurological: Negative.  Negative for dizziness, weakness, light-headedness and numbness.  Hematological: Negative for adenopathy. Does not bruise/bleed easily.  Psychiatric/Behavioral: Negative.     Objective:  BP 126/80 (BP Location: Left Arm, Patient Position: Sitting, Cuff Size: Normal)   Pulse 61   Temp 98.2 F (36.8 C) (Oral)   Ht 5\' 11"  (1.803 m)   Wt 201 lb (91.2 kg)   SpO2 95%   BMI 28.03 kg/m   BP Readings from Last 3 Encounters:  10/16/17 126/80  04/18/17 122/87  04/08/17 118/62    Wt Readings from Last 3 Encounters:  10/16/17 201 lb (91.2 kg)  07/11/17 204 lb 5.9 oz (92.7 kg)  04/18/17 206 lb 9.6 oz (93.7 kg)    Physical Exam  Constitutional: No distress.  HENT:  Mouth/Throat: Oropharynx is clear and moist. No oropharyngeal exudate.  Eyes: Conjunctivae are normal. Right eye exhibits no discharge. Left eye exhibits no discharge. No scleral icterus.  Neck: Normal range of motion. Neck  supple. No JVD present. No thyromegaly present.  Cardiovascular: Normal rate, regular rhythm and intact distal pulses. Exam reveals no gallop and no friction rub.  No murmur heard. Pulmonary/Chest: Effort normal and breath sounds normal. No respiratory distress. He has no wheezes. He has no rales. He exhibits no tenderness.  Abdominal: Soft. Bowel sounds are normal. He exhibits no distension and no mass. There is no tenderness. There is no rebound and no guarding. Hernia confirmed negative in  the right inguinal area and confirmed negative in the left inguinal area.  Genitourinary: Penis normal. Right testis shows no mass, no swelling and no tenderness. Right testis is descended. Left testis shows mass. Left testis shows no swelling and no tenderness. Left testis is descended. Circumcised. No penile erythema or penile tenderness. No discharge found.  Genitourinary Comments: Left hemiscrotum is filled with a firm, round, nontender mass.  Lymphadenopathy:    He has no cervical adenopathy.       Right: No inguinal adenopathy present.       Left: No inguinal adenopathy present.  Skin: He is not diaphoretic.  Vitals reviewed.   Lab Results  Component Value Date   WBC 7.8 10/16/2017   HGB 16.0 10/16/2017   HCT 48.7 10/16/2017   PLT 226.0 10/16/2017   GLUCOSE 105 (H) 10/16/2017   CHOL 114 06/16/2017   TRIG 111 06/16/2017   HDL 40 (L) 06/16/2017   LDLDIRECT 146.0 10/15/2016   LDLCALC 52 06/16/2017   ALT 26 10/16/2017   AST 27 10/16/2017   NA 141 10/16/2017   K 4.1 10/16/2017   CL 103 10/16/2017   CREATININE 0.97 10/16/2017   BUN 13 10/16/2017   CO2 27 10/16/2017   TSH 3.06 10/15/2016   PSA 0.73 10/16/2017   INR 1.00 02/11/2017   HGBA1C 5.5 10/15/2016    No results found.  Assessment & Plan:   Bastian was seen today for diarrhea.  Diagnoses and all orders for this visit:  Need for influenza vaccination -     Flu vaccine HIGH DOSE PF (Fluzone High dose)  Hypocalcemia- his calcium level is normal now. -     Comprehensive metabolic panel; Future  Benign prostatic hyperplasia without lower urinary tract symptoms-his PSA remains low which is very reassuring that he does not have prostate cancer.  He has no symptoms that need to be treated. -     PSA; Future  Hyperlipidemia with target LDL less than 130  Scrotal mass- He has discomfort in the right scrotum but the mass is in the left scrotum.  I have asked him to see urology to see if this needs surgical  intervention. -     Ambulatory referral to Urology  Diarrhea, unspecified type- Will screen for celiac disease.  Will empirically treat for exocrine pancreatic insufficiency.  His CBC and sed rate are normal and he does not have bloody stools so I do not think he has IBD.  He does not have pain so I do not think it is IBS.  Will continue to avoid dairy products. -     CBC with Differential/Platelet; Future -     Sedimentation rate; Future -     Pancreatic elastase, fecal; Future -     Gliadin antibodies, serum; Future -     Tissue transglutaminase, IgA; Future -     Reticulin Antibody, IgA w reflex titer; Future  Exocrine pancreatic insufficiency -     Pancrelipase, Lip-Prot-Amyl, (ZENPEP) 40000-126000 units CPEP; Take 1 capsule 4 (four)  times daily by mouth.   I have discontinued Tana Conch. Hegarty's pantoprazole and metoprolol tartrate. I am also having him start on Pancrelipase (Lip-Prot-Amyl). Additionally, I am having him maintain his aspirin EC, ticagrelor, nitroGLYCERIN, and atorvastatin.  Meds ordered this encounter  Medications  . Pancrelipase, Lip-Prot-Amyl, (ZENPEP) 40000-126000 units CPEP    Sig: Take 1 capsule 4 (four) times daily by mouth.    Dispense:  120 capsule    Refill:  3     Follow-up: No Follow-up on file.  Scarlette Calico, MD

## 2017-10-20 LAB — RETICULIN ANTIBODIES, IGA W TITER: Reticulin IGA Screen: NEGATIVE

## 2017-10-20 LAB — TISSUE TRANSGLUTAMINASE, IGA: (tTG) Ab, IgA: 1 U/mL

## 2017-10-20 LAB — GLIADIN ANTIBODIES, SERUM
GLIADIN IGA: 8 U
Gliadin IgG: 3 Units

## 2017-10-26 ENCOUNTER — Encounter: Payer: Self-pay | Admitting: Internal Medicine

## 2017-10-30 LAB — PANCREATIC ELASTASE, FECAL: PANCREATIC ELASTASE-1, STL: 281 ug/g

## 2017-11-06 ENCOUNTER — Encounter: Payer: Self-pay | Admitting: Internal Medicine

## 2017-12-10 DIAGNOSIS — N430 Encysted hydrocele: Secondary | ICD-10-CM | POA: Diagnosis not present

## 2017-12-19 ENCOUNTER — Encounter: Payer: Self-pay | Admitting: Cardiovascular Disease

## 2017-12-19 ENCOUNTER — Ambulatory Visit (INDEPENDENT_AMBULATORY_CARE_PROVIDER_SITE_OTHER): Payer: Medicare Other | Admitting: Cardiovascular Disease

## 2017-12-19 VITALS — BP 142/84 | HR 62 | Ht 72.0 in | Wt 207.0 lb

## 2017-12-19 DIAGNOSIS — Z7902 Long term (current) use of antithrombotics/antiplatelets: Secondary | ICD-10-CM | POA: Diagnosis not present

## 2017-12-19 DIAGNOSIS — I2510 Atherosclerotic heart disease of native coronary artery without angina pectoris: Secondary | ICD-10-CM

## 2017-12-19 DIAGNOSIS — Z9861 Coronary angioplasty status: Secondary | ICD-10-CM

## 2017-12-19 DIAGNOSIS — E785 Hyperlipidemia, unspecified: Secondary | ICD-10-CM | POA: Diagnosis not present

## 2017-12-19 DIAGNOSIS — I255 Ischemic cardiomyopathy: Secondary | ICD-10-CM | POA: Diagnosis not present

## 2017-12-19 DIAGNOSIS — Z5181 Encounter for therapeutic drug level monitoring: Secondary | ICD-10-CM

## 2017-12-19 MED ORDER — CLOPIDOGREL BISULFATE 75 MG PO TABS: 75.0000 mg | ORAL_TABLET | Freq: Every day | ORAL | 3 refills | Status: DC

## 2017-12-19 NOTE — Progress Notes (Signed)
12/19/2017 [name]   [birth year]  [id number]  Primary Physician Janith Lima, MD Primary Cardiologist: Lorretta Harp MD Lupe Carney, [place]  HPI:  John Mccarthy is a 67 y.o.  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 I last saw him in the office 04/18/17. He had anterior STEMI while think the best tenderness on 02/11/17. He is brought to the cath lab emergently and radial cath by myself revealing an ostial LAD occlusion which I stented with a synergy (3 mm x 12 mm) drug-eluting stent. He did have a 60% proximal to mid dominant RCA stenosis which I elected to treat medically. EF was 50% with anteroapical wall motion abnormality. Follow-up 2-D echocardiogram performed 06/24/17 revealed normalization of LV function. He is on dual therapy including aspirin Brilenta. He is asymptomatic back playing tennis several times a week including singles tennis 3 hours at a time without limitation. His major complaint is of diarrhea of unknown etiology.   Current Meds  Medication Sig  . aspirin EC 81 MG tablet Take 1 tablet (81 mg total) by mouth daily.  Marland Kitchen atorvastatin (LIPITOR) 40 MG tablet Take 1 tablet (40 mg total) by mouth daily at 6 PM.  . nitroGLYCERIN (NITROSTAT) 0.4 MG SL tablet Place 1 tablet (0.4 mg total) under the tongue every 5 (five) minutes as needed for chest pain.  . ticagrelor (BRILINTA) 90 MG TABS tablet Take 1 tablet (90 mg total) by mouth 2 (two) times daily.     Allergies  Allergen Reactions  . Crestor [Rosuvastatin Calcium] Other (See Comments)    Muscle aches  . Lactose Intolerance (Gi) Diarrhea    Social History   Socioeconomic History  . Marital status: Married    Spouse name: Not on file  . Number of children: Not on file  . Years of education: Not on file  . Highest education level: Not on file  Social Needs  . Financial resource strain: Not on file  .  Food insecurity - worry: Not on file  . Food insecurity - inability: Not on file  . Transportation needs - medical: Not on file  . Transportation needs - non-medical: Not on file  Occupational History  . Not on file  Tobacco Use  . Smoking status: Never Smoker  . Smokeless tobacco: Never Used  Substance and Sexual Activity  . Alcohol use: Yes    Alcohol/week: 3.0 oz    Types: 5 Shots of liquor per week    Comment: occassional  . Drug use: No  . Sexual activity: Yes    Birth control/protection: None  Other Topics Concern  . Not on file  Social History Narrative  . Not on file     Review of Systems: General: negative for chills, fever, night sweats or weight changes.  Cardiovascular: negative for chest pain, dyspnea on exertion, edema, orthopnea, palpitations, paroxysmal nocturnal dyspnea or shortness of breath Dermatological: negative for rash Respiratory: negative for cough or wheezing Urologic: negative for hematuria Abdominal: negative for nausea, vomiting, diarrhea, bright red blood per rectum, melena, or hematemesis Neurologic: negative for visual changes, syncope, or dizziness All other systems reviewed and are otherwise negative except as noted above.    Blood pressure (!) 142/84, pulse 62, height 6' (1.829 m), weight 207 lb (93.9 kg).  General appearance: alert and no distress Neck: no adenopathy, no carotid bruit, no JVD, supple, symmetrical, trachea  midline and thyroid not enlarged, symmetric, no tenderness/mass/nodules Lungs: clear to auscultation bilaterally Heart: regular rate and rhythm, S1, S2 normal, no murmur, click, rub or gallop Extremities: extremities normal, atraumatic, no cyanosis or edema Pulses: 2+ and symmetric Skin: Skin color, texture, turgor normal. No rashes or lesions Neurologic: Alert and oriented X 3, normal strength and tone. Normal symmetric reflexes. Normal coordination and gait  EKG sinus rhythm at 62 without ST or T-wave changes. I  personally reviewed this EKG.  ASSESSMENT AND PLAN:   Hyperlipidemia with target LDL less than 130 History of hyperlipidemia on statin therapy with lipid profile performed 06/16/17 with a total cholesterol 114, LDL 52 and HDL of 40.  CAD S/P percutaneous coronary angioplasty History of CAD status post inferior STEMI 02/11/17 with cardiac catheterization performed radially revealing occluded proximal LAD which I stented with a 3 mm x 12 mm long synergy drug-eluting stent postdilated 3.3 mm. He did have a 60% dominant RCA stenosis and inferoapical hypokinesia with an EF of 45%. His port to balloon time was 21 minutes. Follow-up 2-D echo performed 06/24/17 showed normal LV function. He is totally asymptomatic in plays singles tennis  twice a week 3 hours each without limitation. I'm going to discontinue his Brilenta on March 13 of this year and transition him to Plavix. We will check a verify now testing weeks later.  Ischemic cardiomyopathy History of ischemic cardiomyopathy at the time of his anterior STEMI 02/11/17 which ultimately improved to normal in July by 2-D echocardiography.      Lorretta Harp MD FACP,FACC,FAHA, [facility] 12/19/2017 10:26 AM

## 2017-12-19 NOTE — Patient Instructions (Signed)
Medication Instructions: START Plavix 75 mg the day after stopping Brilinta.   Labwork: Your physician recommends that you return for lab work--2 weeks after starting Plavix: P2Y12--only done at the [facility]. You will need to register for this when you go in.   Follow-Up: Your physician wants you to follow-up in: 1 year with Dr. Gwenlyn Found. You will receive a reminder letter in the mail two months in advance. If you don't receive a letter, please call our office to schedule the follow-up appointment.  If you need a refill on your cardiac medications before your next appointment, please call your pharmacy.

## 2017-12-19 NOTE — Addendum Note (Signed)
Addended by: Therisa Doyne on: 12/19/2017 02:38 PM   Modules accepted: Orders

## 2017-12-19 NOTE — Assessment & Plan Note (Signed)
History of hyperlipidemia on statin therapy with lipid profile performed 06/16/17 with a total cholesterol 114, LDL 52 and HDL of 40.

## 2017-12-19 NOTE — Assessment & Plan Note (Signed)
History of CAD status post inferior STEMI 02/11/17 with cardiac catheterization performed radially revealing occluded proximal LAD which I stented with a 3 mm x 12 mm long synergy drug-eluting stent postdilated 3.3 mm. He did have a 60% dominant RCA stenosis and inferoapical hypokinesia with an EF of 45%. His port to balloon time was 21 minutes. Follow-up 2-D echo performed 06/24/17 showed normal LV function. He is totally asymptomatic in plays singles tennis  twice a week 3 hours each without limitation. I'm going to discontinue his Brilenta on March 13 of this year and transition him to Plavix. We will check a verify now testing weeks later.

## 2017-12-19 NOTE — Assessment & Plan Note (Signed)
History of ischemic cardiomyopathy at the time of his anterior STEMI 02/11/17 which ultimately improved to normal in July by 2-D echocardiography.

## 2017-12-30 ENCOUNTER — Other Ambulatory Visit: Payer: Self-pay | Admitting: Cardiovascular Disease

## 2017-12-30 NOTE — Telephone Encounter (Signed)
Rx(s) sent to pharmacy electronically.  

## 2018-01-14 DIAGNOSIS — Z85828 Personal history of other malignant neoplasm of skin: Secondary | ICD-10-CM | POA: Diagnosis not present

## 2018-01-14 DIAGNOSIS — L570 Actinic keratosis: Secondary | ICD-10-CM | POA: Diagnosis not present

## 2018-01-14 DIAGNOSIS — D225 Melanocytic nevi of trunk: Secondary | ICD-10-CM | POA: Diagnosis not present

## 2018-01-14 DIAGNOSIS — L720 Epidermal cyst: Secondary | ICD-10-CM | POA: Diagnosis not present

## 2018-01-14 DIAGNOSIS — Z85820 Personal history of malignant melanoma of skin: Secondary | ICD-10-CM | POA: Diagnosis not present

## 2018-01-14 DIAGNOSIS — D1801 Hemangioma of skin and subcutaneous tissue: Secondary | ICD-10-CM | POA: Diagnosis not present

## 2018-01-14 DIAGNOSIS — D485 Neoplasm of uncertain behavior of skin: Secondary | ICD-10-CM | POA: Diagnosis not present

## 2018-01-14 DIAGNOSIS — D2262 Melanocytic nevi of left upper limb, including shoulder: Secondary | ICD-10-CM | POA: Diagnosis not present

## 2018-01-14 DIAGNOSIS — L821 Other seborrheic keratosis: Secondary | ICD-10-CM | POA: Diagnosis not present

## 2018-01-14 DIAGNOSIS — D2271 Melanocytic nevi of right lower limb, including hip: Secondary | ICD-10-CM | POA: Diagnosis not present

## 2018-01-14 DIAGNOSIS — D2272 Melanocytic nevi of left lower limb, including hip: Secondary | ICD-10-CM | POA: Diagnosis not present

## 2018-01-14 DIAGNOSIS — D2261 Melanocytic nevi of right upper limb, including shoulder: Secondary | ICD-10-CM | POA: Diagnosis not present

## 2018-02-09 ENCOUNTER — Other Ambulatory Visit (HOSPITAL_COMMUNITY)
Admission: RE | Admit: 2018-02-09 | Discharge: 2018-02-09 | Disposition: A | Discharge status: Home / Self Care | Payer: Medicare Other | Source: Ambulatory Visit | Attending: Cardiovascular Disease | Admitting: Cardiovascular Disease

## 2018-02-09 DIAGNOSIS — E785 Hyperlipidemia, unspecified: Secondary | ICD-10-CM | POA: Insufficient documentation

## 2018-02-09 LAB — PLATELET INHIBITION P2Y12: PLATELET FUNCTION P2Y12: 172 [PRU] — AB (ref 194–418)

## 2018-03-10 DIAGNOSIS — L720 Epidermal cyst: Secondary | ICD-10-CM | POA: Diagnosis not present

## 2018-03-10 DIAGNOSIS — Z85828 Personal history of other malignant neoplasm of skin: Secondary | ICD-10-CM | POA: Diagnosis not present

## 2018-06-08 DIAGNOSIS — H5212 Myopia, left eye: Secondary | ICD-10-CM | POA: Diagnosis not present

## 2018-06-08 DIAGNOSIS — H52203 Unspecified astigmatism, bilateral: Secondary | ICD-10-CM | POA: Diagnosis not present

## 2018-06-08 DIAGNOSIS — H524 Presbyopia: Secondary | ICD-10-CM | POA: Diagnosis not present

## 2018-06-08 DIAGNOSIS — H2513 Age-related nuclear cataract, bilateral: Secondary | ICD-10-CM | POA: Diagnosis not present

## 2018-07-09 ENCOUNTER — Encounter: Payer: Self-pay | Admitting: Internal Medicine

## 2018-07-09 ENCOUNTER — Ambulatory Visit (INDEPENDENT_AMBULATORY_CARE_PROVIDER_SITE_OTHER): Payer: Medicare Other | Admitting: Internal Medicine

## 2018-07-09 ENCOUNTER — Other Ambulatory Visit (INDEPENDENT_AMBULATORY_CARE_PROVIDER_SITE_OTHER): Payer: Medicare Other

## 2018-07-09 VITALS — BP 132/70 | HR 58 | Temp 98.1°F | Resp 16 | Ht 72.0 in | Wt 206.1 lb

## 2018-07-09 DIAGNOSIS — E785 Hyperlipidemia, unspecified: Secondary | ICD-10-CM

## 2018-07-09 DIAGNOSIS — I255 Ischemic cardiomyopathy: Secondary | ICD-10-CM | POA: Diagnosis not present

## 2018-07-09 DIAGNOSIS — R6882 Decreased libido: Secondary | ICD-10-CM | POA: Diagnosis not present

## 2018-07-09 DIAGNOSIS — E291 Testicular hypofunction: Secondary | ICD-10-CM | POA: Diagnosis not present

## 2018-07-09 LAB — LIPID PANEL
CHOL/HDL RATIO: 3
CHOLESTEROL: 122 mg/dL (ref 0–200)
HDL: 40 mg/dL (ref >39.00)
LDL Cholesterol: 52 mg/dL (ref 0–99)
NonHDL: 81.84
TRIGLYCERIDES: 151 mg/dL — AB (ref 0.0–149.0)
VLDL: 30.2 mg/dL (ref 0.0–40.0)

## 2018-07-09 LAB — COMPREHENSIVE METABOLIC PANEL
ALBUMIN: 4.3 g/dL (ref 3.5–5.2)
ALK PHOS: 82 U/L (ref 39–117)
ALT: 21 U/L (ref 0–53)
AST: 20 U/L (ref 0–37)
BUN: 13 mg/dL (ref 6–23)
CALCIUM: 9.3 mg/dL (ref 8.4–10.5)
CO2: 28 mEq/L (ref 19–32)
Chloride: 104 mEq/L (ref 96–112)
Creatinine, Ser: 0.97 mg/dL (ref 0.40–1.50)
GFR: 81.53 mL/min (ref >60.00)
Glucose, Bld: 104 mg/dL — ABNORMAL HIGH (ref 70–99)
Potassium: 4 mEq/L (ref 3.5–5.1)
Sodium: 141 mEq/L (ref 135–145)
TOTAL PROTEIN: 7.4 g/dL (ref 6.0–8.3)
Total Bilirubin: 1 mg/dL (ref 0.2–1.2)

## 2018-07-09 MED ORDER — ZOSTER VAC RECOMB ADJUVANTED 50 MCG/0.5ML IM SUSR: 0.5000 mL | Freq: Once | INTRAMUSCULAR | 1 refills | Status: AC

## 2018-07-09 NOTE — Progress Notes (Signed)
Subjective:  Patient ID: John Mccarthy, male    DOB: 19-Apr-1951  Age: 67 y.o. MRN: [id number]  CC: Hyperlipidemia and Coronary Artery Disease   HPI HENSON FRATICELLI presents for f/up - He complains of decreasing muscle mass and low libido.  He wants to have his testosterone level checked.  He has had no recent episodes of DOE, CP, or fatigue.  He plays 2-1/2 hours of singles tennis several times a week and has good endurance.  The diarrhea he was experiencing over the last year has resolved.  He is tolerating the statin well with no muscle or joint aches.  Outpatient Medications Prior to Visit  Medication Sig Dispense Refill  . aspirin EC 81 MG tablet Take 1 tablet (81 mg total) by mouth daily. 90 tablet 3  . atorvastatin (LIPITOR) 40 MG tablet TAKE 1 TABLET (40 MG TOTAL) BY MOUTH DAILY AT 6 PM. 30 tablet 8  . clopidogrel (PLAVIX) 75 MG tablet Take 1 tablet (75 mg total) by mouth daily. 90 tablet 3  . nitroGLYCERIN (NITROSTAT) 0.4 MG SL tablet Place 1 tablet (0.4 mg total) under the tongue every 5 (five) minutes as needed for chest pain. 25 tablet 3  . Pancrelipase, Lip-Prot-Amyl, (ZENPEP) 40000-126000 units CPEP Take 1 capsule 4 (four) times daily by mouth. 120 capsule 3  . ticagrelor (BRILINTA) 90 MG TABS tablet Take 1 tablet (90 mg total) by mouth 2 (two) times daily. 60 tablet 11   No facility-administered medications prior to visit.     ROS Review of Systems  Constitutional: Negative for activity change, diaphoresis and fatigue.  HENT: Negative.   Eyes: Negative for visual disturbance.  Respiratory: Negative for cough, chest tightness, shortness of breath and wheezing.   Cardiovascular: Negative for chest pain, palpitations and leg swelling.  Gastrointestinal: Negative for abdominal pain, constipation, diarrhea, nausea and vomiting.  Endocrine: Negative.  Negative for cold intolerance and heat intolerance.  Genitourinary: Negative.  Negative for difficulty urinating and  dysuria.  Musculoskeletal: Negative.  Negative for arthralgias and myalgias.  Skin: Negative for color change and pallor.  Neurological: Negative.  Negative for dizziness, weakness and numbness.  Hematological: Negative for adenopathy. Does not bruise/bleed easily.  Psychiatric/Behavioral: Negative.     Objective:  BP 132/70 (BP Location: Left Arm, Patient Position: Sitting, Cuff Size: Normal)   Pulse (!) 58   Temp 98.1 F (36.7 C) (Oral)   Resp 16   Ht 6' (1.829 m)   Wt 206 lb 1.3 oz (93.5 kg)   SpO2 96%   BMI 27.95 kg/m   BP Readings from Last 3 Encounters:  07/09/18 132/70  12/19/17 (!) 142/84  10/16/17 126/80    Wt Readings from Last 3 Encounters:  07/09/18 206 lb 1.3 oz (93.5 kg)  12/19/17 207 lb (93.9 kg)  10/16/17 201 lb (91.2 kg)    Physical Exam  Constitutional: He is oriented to person, place, and time. No distress.  HENT:  Mouth/Throat: Oropharynx is clear and moist. No oropharyngeal exudate.  Eyes: Conjunctivae are normal. No scleral icterus.  Neck: Normal range of motion. Neck supple.  Cardiovascular: Normal rate, regular rhythm and normal heart sounds. Exam reveals no gallop.  No murmur heard. Pulmonary/Chest: Effort normal and breath sounds normal. He has no wheezes. He has no rales.  Abdominal: Soft. Normal appearance and bowel sounds are normal. He exhibits no mass. There is no hepatosplenomegaly. There is no tenderness. No hernia.  Musculoskeletal: Normal range of motion. He exhibits no edema, tenderness  or deformity.  Neurological: He is alert and oriented to person, place, and time.  Skin: Skin is warm and dry. No rash noted. He is not diaphoretic.  Vitals reviewed.   Lab Results  Component Value Date   WBC 7.8 10/16/2017   HGB 16.0 10/16/2017   HCT 48.7 10/16/2017   PLT 226.0 10/16/2017   GLUCOSE 104 (H) 07/09/2018   CHOL 122 07/09/2018   TRIG 151.0 (H) 07/09/2018   HDL 40.00 07/09/2018   LDLDIRECT 146.0 10/15/2016   LDLCALC 52  07/09/2018   ALT 21 07/09/2018   AST 20 07/09/2018   NA 141 07/09/2018   K 4.0 07/09/2018   CL 104 07/09/2018   CREATININE 0.97 07/09/2018   BUN 13 07/09/2018   CO2 28 07/09/2018   TSH 2.93 07/09/2018   PSA 0.73 10/16/2017   INR 1.00 02/11/2017   HGBA1C 5.5 10/15/2016    No results found.  Assessment & Plan:   Delon was seen today for hyperlipidemia and coronary artery disease.  Diagnoses and all orders for this visit:  Hyperlipidemia with target LDL less than 130- He has achieved his LDL goal and is doing well on the statin. -     Thyroid Panel With TSH; Future -     Comprehensive metabolic panel; Future -     Lipid panel; Future  Hypocalcemia- His calcium level is normal now. -     Comprehensive metabolic panel; Future  Low libido- He has a low free testosterone level. -     Testosterone Total,Free,Bio, Males; Future -     Testosterone 12.5 MG/ACT (1%) GEL; Place 1 Act onto the skin daily.  Hypogonadism male- He has a low free testosterone level and is symptomatic.  There is no evidence of a secondary cause.  I have asked him to start a transdermal testosterone replacement therapy. -     Testosterone 12.5 MG/ACT (1%) GEL; Place 1 Act onto the skin daily.  Other orders -     Zoster Vaccine Adjuvanted Scnetx) injection; Inject 0.5 mLs into the muscle once for 1 dose.   I have discontinued Sandi Carne ticagrelor and Pancrelipase (Lip-Prot-Amyl). I am also having him start on Zoster Vaccine Adjuvanted and Testosterone. Additionally, I am having him maintain his aspirin EC, nitroGLYCERIN, clopidogrel, and atorvastatin.  Meds ordered this encounter  Medications  . Zoster Vaccine Adjuvanted [facility]) injection    Sig: Inject 0.5 mLs into the muscle once for 1 dose.    Dispense:  0.5 mL    Refill:  1  . Testosterone 12.5 MG/ACT (1%) GEL    Sig: Place 1 Act onto the skin daily.    Dispense:  225 g    Refill:  1     Follow-up: Return in about 6 months  (around 01/09/2019).  Scarlette Calico, MD

## 2018-07-09 NOTE — Patient Instructions (Signed)
Coronary Artery Disease, Male  Coronary artery disease (CAD) is a condition in which the arteries that lead to the heart (coronary arteries) become narrow or blocked. The narrowing or blockage can lead to decreased blood flow to the heart. Prolonged reduced blood flow can cause a heart attack (myocardial infarction or MI). This condition may also be called coronary heart disease.  Because CAD is the leading cause of death in men, it is important to understand what causes this condition and how it is treated.  What are the causes?  CAD is most often caused by atherosclerosis. This is the buildup of fat and cholesterol (plaque) on the inside of the arteries. Over time, the plaque may narrow or block the artery, reducing blood flow to the heart. Plaque can also become weak and break off within a coronary artery and cause a sudden blockage. Other less common causes of CAD include:   An embolism or blood clot in a coronary artery.   A tearing of the artery (spontaneous coronary artery dissection).   An aneurysm.   Inflammation (vasculitis) in the artery wall.    What increases the risk?  The following factors may make you more likely to develop this condition:   Age. Men over age 45 are at a greater risk of CAD.   Family history of CAD.   Gender. Men often develop CAD earlier in life than women.   High blood pressure (hypertension).   Diabetes.   High cholesterol levels.   Tobacco use.   Excessive alcohol use.   Lack of exercise.   A diet high in saturated and trans fats, such as fried food and processed meat.    Other possible risk factors include:   High stress levels.   Depression.   Obesity.   Sleep apnea.    What are the signs or symptoms?  Many people do not have any symptoms during the early stages of CAD. As the condition progresses, symptoms may include:   Chest pain (angina). The pain can:  ? Feel like a crushing or squeezing, or a tightness, pressure, fullness, or heaviness in the  chest.  ? Last more than a few minutes or can stop and recur. The pain tends to get worse with exercise or stress and to fade with rest.   Pain in the arms, neck, jaw, or back.   Unexplained heartburn or indigestion.   Shortness of breath.   Nausea or vomiting.   Sudden light-headedness.   Sudden cold sweats.   Fluttering or fast heartbeat (palpitations).    How is this diagnosed?  This condition is diagnosed based on:   Your family and medical history.   A physical exam.   Tests, including:  ? A test to check the electrical signals in your heart (electrocardiogram).  ? Exercise stress test. This looks for signs of blockage when the heart is stressed with exercise, such as running on a treadmill.  ? Pharmacologic stress test. This test looks for signs of blockage when the heart is being stressed with a medicine.  ? Blood tests.  ? Coronary angiogram. This is a procedure to look at the coronary arteries to see if there is any blockage. During this test, a dye is injected into your arteries so they appear on an X-ray.  ? A test that uses sound waves to take a picture of your heart (echocardiogram).  ? Chest X-ray.    How is this treated?  This condition may be treated by:     Healthy lifestyle changes to reduce risk factors.   Medicines such as:  ? Antiplatelet medicines and blood-thinning medicines, such as aspirin. These help to prevent blood clots.  ? Nitroglycerin.  ? Blood pressure medicines.  ? Cholesterol-lowering medicine.   Coronary angioplasty and stenting. During this procedure, a thin, flexible tube is inserted through a blood vessel and into a blocked artery. A balloon or similar device on the end of the tube is inflated to open up the artery. In some cases, a small, mesh tube (stent) is inserted into the artery to keep it open.   Coronary artery bypass surgery. During this surgery, veins or arteries from other parts of the body are used to create a bypass around the blockage and allow blood  to reach your heart.    Follow these instructions at home:  Medicines   Take over-the-counter and prescription medicines only as told by your health care provider.   Do not take the following medicines unless your health care provider approves:  ? NSAIDs, such as ibuprofen, naproxen, or celecoxib.  ? Vitamin supplements that contain vitamin A, vitamin E, or both.  Lifestyle   Follow an exercise program approved by your health care provider. Aim for 150 minutes of moderate exercise or 75 minutes of vigorous exercise each week.   Maintain a healthy weight or lose weight as approved by your health care provider.   Rest when you are tired.   Learn to manage stress or try to limit your stress. Ask your health care provider for suggestions if you need help.   Get screened for depression and seek treatment, if needed.   Do not use any products that contain nicotine or tobacco, such as cigarettes and e-cigarettes. If you need help quitting, ask your health care provider.   Do not use illegal drugs.  Eating and drinking   Follow a heart-healthy diet. A dietitian can help educate you about healthy food options and changes. In general, eat plenty of fruits and vegetables, lean meats, and whole grains.   Avoid foods high in:  ? Sugar.  ? Salt (sodium).  ? Saturated fat, such as processed or fatty meat.  ? Trans fat, such as fried foods.   Use healthy cooking methods such as roasting, grilling, broiling, baking, poaching, steaming, or stir-frying.   If you drink alcohol, and your health care provider approves, limit your alcohol intake to no more than 2 drinks per day. One drink equals 12 ounces of beer, 5 ounces of wine, or 1 ounces of hard liquor.  General instructions   Manage any other health conditions, such as hypertension and diabetes. These conditions affect your heart.   Your health care provider may ask you to monitor your blood pressure. Ideally, your blood pressure should be below 130/80.   Keep all  follow-up visits as told by your health care provider. This is important.  Get help right away if:   You have pain in your chest, neck, arm, jaw, stomach, or back that:  ? Lasts more than a few minutes.  ? Is recurring.  ? Is not relieved by taking medicine under your tongue (sublingualnitroglycerin).   You have too much (profuse) sweating without cause.   You have unexplained:  ? Heartburn or indigestion.  ? Shortness of breath or difficulty breathing.  ? Fluttering or fast heartbeat (palpitations).  ? Nausea or vomiting.  ? Fatigue.  ? Feelings of nervousness or anxiety.  ? Weakness.  ? Diarrhea.   You   have sudden light-headedness or dizziness.   You faint.   You feel like hurting yourself or think about taking your own life.  These symptoms may represent a serious problem that is an emergency. Do not wait to see if the symptoms will go away. Get medical help right away. Call your local emergency services (911 in the [place] Do not drive yourself to the hospital.  Summary   Coronary artery disease (CAD) is a process in which the arteries that lead to the heart (coronary arteries) become narrow or blocked. The narrowing or blockage can lead to a heart attack.   Many people do not have any symptoms during the early stages of CAD. This is called "silent CAD."   CAD can be treated with lifestyle changes, medicines, surgery, or a combination of these treatments.  This information is not intended to replace advice given to you by your health care provider. Make sure you discuss any questions you have with your health care provider.  Document Released: 06/15/2014 Document Revised: 11/08/2016 Document Reviewed: 11/08/2016  Elsevier Interactive Patient Education  2018 [employer] Inc[employer]

## 2018-07-10 ENCOUNTER — Encounter: Payer: Self-pay | Admitting: Internal Medicine

## 2018-07-10 LAB — TESTOSTERONE TOTAL,FREE,BIO, MALES
Albumin: 4.3 g/dL (ref 3.6–5.1)
Sex Hormone Binding: 55 nmol/L (ref 22–77)
Testosterone, Bioavailable: 46.2 ng/dL — ABNORMAL LOW (ref >=110.0)
Testosterone, Free: 23.5 pg/mL — ABNORMAL LOW (ref 46.0–224.0)
Testosterone: 285 ng/dL (ref 250–827)

## 2018-07-10 LAB — THYROID PANEL WITH TSH
Free Thyroxine Index: 2.5 (ref 1.4–3.8)
T3 UPTAKE: 38 % — AB (ref 22–35)
T4 TOTAL: 6.5 ug/dL (ref 4.9–10.5)
TSH: 2.93 mIU/L (ref 0.40–4.50)

## 2018-07-12 DIAGNOSIS — E291 Testicular hypofunction: Secondary | ICD-10-CM | POA: Insufficient documentation

## 2018-07-12 MED ORDER — TESTOSTERONE 12.5 MG/ACT (1%) TD GEL: 1.0000 | Freq: Every day | TRANSDERMAL | 1 refills | Status: DC

## 2018-07-15 ENCOUNTER — Telehealth: Payer: Self-pay | Admitting: *Deleted

## 2018-07-15 DIAGNOSIS — E291 Testicular hypofunction: Secondary | ICD-10-CM

## 2018-07-15 NOTE — Telephone Encounter (Signed)
Testosterone 12.5mg  1% gel PA initiated via [employer].  Key: [id number] - Rx #: [id number]

## 2018-07-20 NOTE — Telephone Encounter (Signed)
Resubmitted PA. Key: [id number]

## 2018-07-22 ENCOUNTER — Other Ambulatory Visit: Payer: Medicare Other

## 2018-07-22 DIAGNOSIS — E291 Testicular hypofunction: Secondary | ICD-10-CM | POA: Diagnosis not present

## 2018-07-22 NOTE — Addendum Note (Signed)
Addended by: Karle Barr on: 07/22/2018 09:14 AM   Modules accepted: Orders

## 2018-07-24 ENCOUNTER — Encounter: Payer: Self-pay | Admitting: Internal Medicine

## 2018-07-24 LAB — TESTOSTERONE TOTAL,FREE,BIO, MALES
Albumin: 4 g/dL (ref 3.6–5.1)
Sex Hormone Binding: 68 nmol/L (ref 22–77)
TESTOSTERONE FREE: 28 pg/mL — AB (ref 46.0–224.0)
Testosterone, Bioavailable: 51.4 ng/dL — ABNORMAL LOW (ref >=110.0)
Testosterone: 398 ng/dL (ref 250–827)

## 2018-09-09 ENCOUNTER — Ambulatory Visit (INDEPENDENT_AMBULATORY_CARE_PROVIDER_SITE_OTHER): Payer: Medicare Other

## 2018-09-09 DIAGNOSIS — Z23 Encounter for immunization: Secondary | ICD-10-CM

## 2018-10-30 ENCOUNTER — Other Ambulatory Visit: Payer: Self-pay | Admitting: Cardiovascular Disease

## 2018-12-14 ENCOUNTER — Ambulatory Visit (INDEPENDENT_AMBULATORY_CARE_PROVIDER_SITE_OTHER): Payer: Medicare Other | Admitting: Emergency Medicine

## 2018-12-14 DIAGNOSIS — Z23 Encounter for immunization: Secondary | ICD-10-CM

## 2019-01-01 ENCOUNTER — Other Ambulatory Visit: Payer: Self-pay | Admitting: Cardiovascular Disease

## 2019-01-07 ENCOUNTER — Encounter: Payer: Self-pay | Admitting: Gastroenterology

## 2019-01-27 ENCOUNTER — Other Ambulatory Visit: Payer: Self-pay | Admitting: Cardiovascular Disease

## 2019-01-28 ENCOUNTER — Other Ambulatory Visit: Payer: Self-pay | Admitting: Cardiovascular Disease

## 2019-01-28 MED ORDER — CLOPIDOGREL BISULFATE 75 MG PO TABS: 75.0000 mg | ORAL_TABLET | Freq: Every day | ORAL | 0 refills | Status: DC

## 2019-01-28 NOTE — Addendum Note (Signed)
Addended by: Diana Eves on: 01/28/2019 03:55 PM   Modules accepted: Orders

## 2019-01-28 NOTE — Telephone Encounter (Signed)
Rx(s) sent to pharmacy electronically.  

## 2019-02-24 ENCOUNTER — Ambulatory Visit: Payer: Medicare Other | Admitting: Cardiovascular Disease

## 2019-02-24 DIAGNOSIS — L814 Other melanin hyperpigmentation: Secondary | ICD-10-CM | POA: Diagnosis not present

## 2019-02-24 DIAGNOSIS — Z85828 Personal history of other malignant neoplasm of skin: Secondary | ICD-10-CM | POA: Diagnosis not present

## 2019-02-24 DIAGNOSIS — D485 Neoplasm of uncertain behavior of skin: Secondary | ICD-10-CM | POA: Diagnosis not present

## 2019-02-24 DIAGNOSIS — D225 Melanocytic nevi of trunk: Secondary | ICD-10-CM | POA: Diagnosis not present

## 2019-02-24 DIAGNOSIS — L821 Other seborrheic keratosis: Secondary | ICD-10-CM | POA: Diagnosis not present

## 2019-02-24 DIAGNOSIS — L570 Actinic keratosis: Secondary | ICD-10-CM | POA: Diagnosis not present

## 2019-02-24 DIAGNOSIS — C44311 Basal cell carcinoma of skin of nose: Secondary | ICD-10-CM | POA: Diagnosis not present

## 2019-02-24 DIAGNOSIS — L738 Other specified follicular disorders: Secondary | ICD-10-CM | POA: Diagnosis not present

## 2019-02-24 DIAGNOSIS — L819 Disorder of pigmentation, unspecified: Secondary | ICD-10-CM | POA: Diagnosis not present

## 2019-03-02 ENCOUNTER — Telehealth: Payer: Self-pay

## 2019-03-02 NOTE — Telephone Encounter (Signed)
Left voice message for the patient to call to back about switching his appointment to a telehealth visit

## 2019-03-02 NOTE — Telephone Encounter (Signed)
Called Patient's wife John Mccarthy leaving a detailed message about trying to reach the patient John Mccarthy involving his appointment for tomorrow  To possibly switch it to a telephone or video visit if not then rescheduling the appointment to the COVID-19.

## 2019-03-02 NOTE — Telephone Encounter (Signed)
Cardiac Questionnaire:    Since your last visit or hospitalization:    1. Have you been having new or worsening chest pain?  NO   2. Have you been having new or worsening shortness of breath? NO 3. Have you been having new or worsening leg swelling, wt gain, or increase in abdominal girth (pants fitting more tightly)? NO   4. Have you had any passing out spells? NO    *A YES to any of these questions would result in the appointment being kept. *If all the answers to these questions are NO, we should indicate that given the current situation regarding the worldwide coronarvirus pandemic, at the recommendation of the CDC, we are looking to limit gatherings in our waiting area, and thus will reschedule their appointment beyond four weeks from today.   _____________   [id number] Pre-Screening Questions:  . Do you currently have a fever? NO (yes = cancel and refer to pcp for e-visit) . Have you recently travelled on a cruise, internationally, or to [place], [place], [place], [place], [place], or [place], [place] [employer]) ? NO (yes = cancel, stay home, monitor symptoms, and contact pcp or initiate e-visit if symptoms develop) . Have you been in contact with someone that is currently pending confirmation of Covid19 testing or has been confirmed to have the [place] virus?  NO (yes = cancel, stay home, away from tested individual, monitor symptoms, and contact pcp or initiate e-visit if symptoms develop) . Are you currently experiencing fatigue or cough? NO (yes = pt should be prepared to have a mask placed at the time of their visit).      Virtual Visit Pre-Appointment Phone Call  Steps For Call:  1. Confirm consent - "In the setting of the current Covid19 crisis, you are scheduled for a (phone or video) visit with your provider on (date) at (time).  Just as we do with many in-office visits, in order for you to participate in this visit, we must obtain consent.  If you'd like, I can send this to your mychart (if  signed up) or email for you to review.  Otherwise, I can obtain your verbal consent now.  All virtual visits are billed to your insurance company just like a normal visit would be.  By agreeing to a virtual visit, we'd like you to understand that the technology does not allow for your provider to perform an examination, and thus may limit your provider's ability to fully assess your condition.  Finally, though the technology is pretty good, we cannot assure that it will always work on either your or our end, and in the setting of a video visit, we may have to convert it to a phone-only visit.  In either situation, we cannot ensure that we have a secure connection.  Are you willing to proceed?"  2. Give patient instructions for WebEx download to smartphone as below if video visit  3. Advise patient to be prepared with any vital sign or heart rhythm information, their current medicines, and a piece of paper and pen handy for any instructions they may receive the day of their visit  4. Inform patient they will receive a phone call 15 minutes prior to their appointment time (may be from unknown caller ID) so they should be prepared to answer  5. Confirm that appointment type is correct in Epic appointment notes (video vs telephone)    TELEPHONE CALL NOTE  John Mccarthy has been deemed a candidate for a follow-up  tele-health visit to limit community exposure during the Covid-19 pandemic. I spoke with the patient via phone to ensure availability of phone/video source, confirm preferred email & phone number, and discuss instructions and expectations.  I reminded John Mccarthy to be prepared with any vital sign and/or heart rhythm information that could potentially be obtained via home monitoring, at the time of his visit. I reminded John Mccarthy to expect a phone call at the time of his visit if his visit.  Did the patient verbally acknowledge consent to treatment? YES  John Brod, RN 03/02/2019    DOWNLOADING THE [place], go to App Store and type in WebEx in the search bar. [place] [employer], the blue/green circle. The app is free but as with any other app downloads, their phone may require them to verify saved payment information or Apple password. The patient does NOT have to create an account.  - If Android, ask patient to go to [employer] and type in WebEx in the search bar. [place] [employer], the blue/green circle. The app is free but as with any other app downloads, their phone may require them to verify saved payment information or Android password. The patient does NOT have to create an account.   CONSENT FOR TELE-HEALTH VISIT - PLEASE REVIEW  I hereby voluntarily request, consent and authorize [facility] and its employed or contracted physicians, physician assistants, nurse practitioners or other licensed health care professionals (the Practitioner), to provide me with telemedicine health care services (the "Services") as deemed necessary by the treating Practitioner. I acknowledge and consent to receive the Services by the Practitioner via telemedicine. I understand that the telemedicine visit will involve communicating with the Practitioner through live audiovisual communication technology and the disclosure of certain medical information by electronic transmission. I acknowledge that I have been given the opportunity to request an in-person assessment or other available alternative prior to the telemedicine visit and am voluntarily participating in the telemedicine visit.  I understand that I have the right to withhold or withdraw my consent to the use of telemedicine in the course of my care at any time, without affecting my right to future care or treatment, and that the Practitioner or I may terminate the telemedicine visit at any time. I understand that I have the right to inspect all  information obtained and/or recorded in the course of the telemedicine visit and may receive copies of available information for a reasonable fee.  I understand that some of the potential risks of receiving the Services via telemedicine include:  Mar[place] Kitchen Delay or interruption in medical evaluation due to technological equipment failure or disruption; . Information transmitted may not be sufficient (e.g. poor resolution of images) to allow for appropriate medical decision making by the Practitioner; and/or  . In rare instances, security protocols could fail, causing a breach of personal health information.  Furthermore, I acknowledge that it is my responsibility to provide information about my medical history, conditions and care that is complete and accurate to the best of my ability. I acknowledge that Practitioner's advice, recommendations, and/or decision may be based on factors not within their control, such as incomplete or inaccurate data provided by me or distortions of diagnostic images or specimens that may result from electronic transmissions. I understand that the practice of medicine is not an exact science and that Practitioner makes no warranties or guarantees regarding treatment outcomes. I acknowledge that  I will receive a copy of this consent concurrently upon execution via email to the email address I last provided but may also request a printed copy by calling the office of [place].    I understand that my insurance will be billed for this visit.   I have read or had this consent read to me. . I understand the contents of this consent, which adequately explains the benefits and risks of the Services being provided via telemedicine.  . I have been provided ample opportunity to ask questions regarding this consent and the Services and have had my questions answered to my satisfaction. . I give my informed consent for the services to be provided through the use of telemedicine in my  medical care  By participating in this telemedicine visit I agree to the above.

## 2019-03-03 ENCOUNTER — Encounter: Payer: Self-pay | Admitting: Cardiovascular Disease

## 2019-03-03 ENCOUNTER — Telehealth: Payer: Self-pay

## 2019-03-03 ENCOUNTER — Telehealth (INDEPENDENT_AMBULATORY_CARE_PROVIDER_SITE_OTHER): Payer: Medicare Other | Admitting: Cardiovascular Disease

## 2019-03-03 DIAGNOSIS — I2510 Atherosclerotic heart disease of native coronary artery without angina pectoris: Secondary | ICD-10-CM

## 2019-03-03 DIAGNOSIS — E785 Hyperlipidemia, unspecified: Secondary | ICD-10-CM | POA: Diagnosis not present

## 2019-03-03 NOTE — Patient Instructions (Signed)

## 2019-03-03 NOTE — Progress Notes (Signed)
Virtual Visit via Video Note    Evaluation Performed:  Follow-up visit  This visit type was conducted due to national recommendations for restrictions regarding the COVID-19 Pandemic (e.g. social distancing).  This format is felt to be most appropriate for this patient at this time.  All issues noted in this document were discussed and addressed.  No physical exam was performed (except for noted visual exam findings with Video Visits).  Please refer to the patient's chart (MyChart message for video visits and phone note for telephone visits) for the patient's consent to telehealth for [facility].  Date:  03/03/2019   ID:  John Mccarthy, DOB 03-Sep-1951, MRN [id number]  Patient Location:  His home  Provider location:   My home  PCP:  Janith Lima, MD  Cardiologist: Dr. Quay Burow Electrophysiologist:  None   Chief Complaint: Follow-up of his myocardial infarction 2 years ago  History of Present Illness:    John Mccarthy is a 68 y.o. Mccarthy who presents via audio/video conferencing for a telehealth visit today.  The patient does not have symptoms concerning for COVID-19 infection (fever, chills, cough, or new shortness of breath).    John Mccarthy is a [phone].  mildly overweight married Caucasian Mccarthy  father of 2 grandfather of 4 grandchildren who is retired as a Conservator, museum/gallery for heavy Academic librarian. I last saw him in the office 12/19/2017. He had anterior STEMI while think the best tenderness on 02/11/17. He is brought to the cath lab emergently and radial cath by myself revealing an ostial LAD occlusion which I stented with a synergy (3 mm x 12 mm) drug-eluting stent. He did have a 60% proximal to mid dominant RCA stenosis which I elected to treat medically. EF was 50% with anteroapical wall motion abnormality.   His door to balloon time was 22 minutes.  Follow-up 2-D echocardiogram performed 06/24/17 revealed normalization of LV function.  He was on dual  antiplatelet therapy including aspirin and Plavix until recently when he discontinued the Plavix which is appropriate.  He is asymptomatic back playing tennis several times a week including singles tennis 1-1/2 hours at a time without limitation.   Since I saw him approximately a year ago he has remained asymptomatic.  He continues to be active and plays tennis without limitation.  He is only on a baby aspirin for antiplatelet therapy as well his as his statin drug.    Prior CV studies:   The following studies were reviewed today:    Past Medical History:  Diagnosis Date   BPH (benign prostatic hyperplasia)    Hyperlipidemia    Melanoma ([place])    Multiple myeloma ([place])    Pulmonary embolism ([place]) 06/15/2013   Skin cancer (melanoma) ([place])    Past Surgical History:  Procedure Laterality Date   ACHILLES TENDON SURGERY Right 05/18/2013   CARDIAC CATHETERIZATION     CORONARY STENT INTERVENTION N/A 02/11/2017   Procedure: Coronary Stent Intervention;  Surgeon: Lorretta Harp, MD;  Location: [place] [facility];  Service: Cardiovascular;  Laterality: N/A;   ESOPHAGOGASTRODUODENOSCOPY  09/24/2012   Procedure: ESOPHAGOGASTRODUODENOSCOPY (EGD);  Surgeon: Lear Ng, MD;  Location: [facility] ENDOSCOPY;  Service: Endoscopy;  Laterality: N/A;   HAND SURGERY     LEFT HEART CATH AND CORONARY ANGIOGRAPHY N/A 02/11/2017   Procedure: Left Heart Cath and Coronary Angiography;  Surgeon: Lorretta Harp, MD;  Location: [place] [facility];  Service: Cardiovascular;  Laterality: N/A;  Current Meds  Medication Sig   aspirin EC 81 MG tablet Take 1 tablet (81 mg total) by mouth daily.   atorvastatin (LIPITOR) 40 MG tablet TAKE 1 TABLET EVERY DAY AT 6PM     Allergies:   Crestor [rosuvastatin calcium] and Lactose intolerance (gi)   Social History   Tobacco Use   Smoking status: Never Smoker   Smokeless tobacco: Never Used  Substance Use Topics   Alcohol use: Yes     Alcohol/week: 5.0 standard drinks    Types: 5 Shots of liquor per week    Comment: occassional   Drug use: No     Family Hx: The patient's family history includes Coronary artery disease (age of onset: 27) in his sister; Heart failure in his father. There is no history of Cancer, COPD, Stroke, Kidney disease, Hypertension, Hyperlipidemia, Heart disease, Hearing loss, Early death, Drug abuse, Diabetes, or Depression.  ROS:   Please see the history of present illness.     All other systems reviewed and are negative.   Labs/Other Tests and Data Reviewed:    Recent Labs: 07/09/2018: ALT 21; BUN 13; Creatinine, Ser 0.97; Potassium 4.0; Sodium 141; TSH 2.93   Recent Lipid Panel Lab Results  Component Value Date/Time   CHOL 122 07/09/2018 09:31 AM   TRIG 151.0 (H) 07/09/2018 09:31 AM   HDL 40.00 07/09/2018 09:31 AM   CHOLHDL 3 07/09/2018 09:31 AM   LDLCALC 52 07/09/2018 09:31 AM   LDLDIRECT 146.0 10/15/2016 03:17 PM    Wt Readings from Last 3 Encounters:  03/03/19 202 lb (91.6 kg)  07/09/18 206 lb 1.3 oz (93.5 kg)  12/19/17 207 lb (93.9 kg)     Objective:    Vital Signs:  Wt 202 lb (91.6 kg)    BMI 27.40 kg/m    A physical exam was not performed today since this was a video virtual visit  ASSESSMENT & PLAN:    1.  Coronary artery disease- history of CAD status post anterior STEMI 02/11/2017 treated with PCI and stenting of his ostial LAD with a 3 mm x 12 mm long Synergy drug-eluting stent.  This was performed radially.  He did have a 60% proximal to mid dominant RCA stenosis which I elected to treat medically.  He was on dual antiplatelet therapy including aspirin and Brilinta for a year and transition to Plavix after that.  He since discontinued Plavix and remains only on a baby aspirin.  He exercises frequently and is completely asymptomatic.  2.  Ischemic cardiomyopathy- his initial EF was in the 50% range with an anteroapical wall motion abnormality.  Subsequent 2D echo  performed 06/24/2017 revealed normalization of his EF.  3: Hyperlipidemia- history of hyperlipidemia on atorvastatin 40 mg a day with lipid profile performed 07/09/2018 revealing a total cholesterol 122, LDL 52 and HDL 40.  COVID-19 Education: The signs and symptoms of COVID-19 were discussed with the patient and how to seek care for testing (follow up with PCP or arrange E-visit).  The importance of social distancing was discussed today.  Patient Risk:   After full review of this patient's clinical status, I feel that they are at least moderate risk at this time.  Time:   Today, I have spent 20 minutes with the patient with telehealth technology discussing his prior heart attack, pharmacologic therapy, diet and exercise, future office visits..     Medication Adjustments/Labs and Tests Ordered: Current medicines are reviewed at length with the patient today.  Concerns regarding medicines  are outlined above.  Tests Ordered: No orders of the defined types were placed in this encounter.  Medication Changes: No orders of the defined types were placed in this encounter.   Disposition:  Follow up in 12 month(s)  Signed, Quay Burow, MD  03/03/2019 9:38 AM    [facility]

## 2019-03-03 NOTE — Telephone Encounter (Signed)
Reviewed AVS with pt. Pt verbalized understanding 

## 2019-03-10 DIAGNOSIS — Z85828 Personal history of other malignant neoplasm of skin: Secondary | ICD-10-CM | POA: Diagnosis not present

## 2019-03-10 DIAGNOSIS — C44311 Basal cell carcinoma of skin of nose: Secondary | ICD-10-CM | POA: Diagnosis not present

## 2019-04-08 ENCOUNTER — Encounter: Payer: Self-pay | Admitting: Gastroenterology

## 2019-04-27 ENCOUNTER — Other Ambulatory Visit: Payer: Self-pay | Admitting: Cardiovascular Disease

## 2019-06-14 DIAGNOSIS — H52203 Unspecified astigmatism, bilateral: Secondary | ICD-10-CM | POA: Diagnosis not present

## 2019-06-14 DIAGNOSIS — H2513 Age-related nuclear cataract, bilateral: Secondary | ICD-10-CM | POA: Diagnosis not present

## 2019-06-14 DIAGNOSIS — H524 Presbyopia: Secondary | ICD-10-CM | POA: Diagnosis not present

## 2019-06-14 DIAGNOSIS — H5213 Myopia, bilateral: Secondary | ICD-10-CM | POA: Diagnosis not present

## 2019-06-28 ENCOUNTER — Emergency Department (HOSPITAL_COMMUNITY): Payer: Medicare Other

## 2019-06-28 ENCOUNTER — Inpatient Hospital Stay (HOSPITAL_COMMUNITY)
Admission: EM | Disposition: A | Discharge status: Home / Self Care | Payer: Self-pay | Source: Home / Self Care | Attending: Cardiovascular Disease

## 2019-06-28 ENCOUNTER — Inpatient Hospital Stay (HOSPITAL_COMMUNITY)
Admission: EM | Admit: 2019-06-28 | Discharge: 2019-07-01 | DRG: 247 | Disposition: A | Discharge status: Home / Self Care | Payer: Medicare Other | Attending: Cardiovascular Disease | Admitting: Cardiovascular Disease

## 2019-06-28 ENCOUNTER — Emergency Department (HOSPITAL_COMMUNITY): Admit: 2019-06-28 | Payer: Medicare Other | Admitting: Cardiovascular Disease

## 2019-06-28 DIAGNOSIS — N400 Enlarged prostate without lower urinary tract symptoms: Secondary | ICD-10-CM | POA: Diagnosis not present

## 2019-06-28 DIAGNOSIS — Z888 Allergy status to other drugs, medicaments and biological substances status: Secondary | ICD-10-CM | POA: Diagnosis not present

## 2019-06-28 DIAGNOSIS — Z91011 Allergy to milk products: Secondary | ICD-10-CM | POA: Diagnosis not present

## 2019-06-28 DIAGNOSIS — E785 Hyperlipidemia, unspecified: Secondary | ICD-10-CM | POA: Diagnosis not present

## 2019-06-28 DIAGNOSIS — I10 Essential (primary) hypertension: Secondary | ICD-10-CM | POA: Diagnosis not present

## 2019-06-28 DIAGNOSIS — Z8249 Family history of ischemic heart disease and other diseases of the circulatory system: Secondary | ICD-10-CM

## 2019-06-28 DIAGNOSIS — I959 Hypotension, unspecified: Secondary | ICD-10-CM | POA: Diagnosis not present

## 2019-06-28 DIAGNOSIS — I472 Ventricular tachycardia: Secondary | ICD-10-CM | POA: Diagnosis not present

## 2019-06-28 DIAGNOSIS — Z86711 Personal history of pulmonary embolism: Secondary | ICD-10-CM

## 2019-06-28 DIAGNOSIS — I252 Old myocardial infarction: Secondary | ICD-10-CM | POA: Diagnosis not present

## 2019-06-28 DIAGNOSIS — Z7982 Long term (current) use of aspirin: Secondary | ICD-10-CM | POA: Diagnosis not present

## 2019-06-28 DIAGNOSIS — I255 Ischemic cardiomyopathy: Secondary | ICD-10-CM | POA: Diagnosis not present

## 2019-06-28 DIAGNOSIS — R110 Nausea: Secondary | ICD-10-CM | POA: Diagnosis present

## 2019-06-28 DIAGNOSIS — Z20828 Contact with and (suspected) exposure to other viral communicable diseases: Secondary | ICD-10-CM | POA: Diagnosis present

## 2019-06-28 DIAGNOSIS — R9431 Abnormal electrocardiogram [ECG] [EKG]: Secondary | ICD-10-CM | POA: Diagnosis not present

## 2019-06-28 DIAGNOSIS — I2102 ST elevation (STEMI) myocardial infarction involving left anterior descending coronary artery: Secondary | ICD-10-CM | POA: Diagnosis not present

## 2019-06-28 DIAGNOSIS — Z955 Presence of coronary angioplasty implant and graft: Secondary | ICD-10-CM | POA: Diagnosis not present

## 2019-06-28 DIAGNOSIS — Z85820 Personal history of malignant melanoma of skin: Secondary | ICD-10-CM

## 2019-06-28 DIAGNOSIS — Z7902 Long term (current) use of antithrombotics/antiplatelets: Secondary | ICD-10-CM

## 2019-06-28 DIAGNOSIS — Z79899 Other long term (current) drug therapy: Secondary | ICD-10-CM

## 2019-06-28 DIAGNOSIS — N179 Acute kidney failure, unspecified: Secondary | ICD-10-CM | POA: Diagnosis not present

## 2019-06-28 DIAGNOSIS — R079 Chest pain, unspecified: Secondary | ICD-10-CM | POA: Diagnosis not present

## 2019-06-28 DIAGNOSIS — I213 ST elevation (STEMI) myocardial infarction of unspecified site: Secondary | ICD-10-CM

## 2019-06-28 DIAGNOSIS — I2510 Atherosclerotic heart disease of native coronary artery without angina pectoris: Secondary | ICD-10-CM

## 2019-06-28 DIAGNOSIS — Z79890 Hormone replacement therapy (postmenopausal): Secondary | ICD-10-CM

## 2019-06-28 HISTORY — PX: CORONARY/GRAFT ACUTE MI REVASCULARIZATION: CATH118305

## 2019-06-28 HISTORY — PX: LEFT HEART CATH AND CORONARY ANGIOGRAPHY: CATH118249

## 2019-06-28 LAB — CBC WITH DIFFERENTIAL/PLATELET
Abs Immature Granulocytes: 0.03 10*3/uL (ref 0.00–0.07)
Basophils Absolute: 0.1 10*3/uL (ref 0.0–0.1)
Basophils Relative: 1 %
Eosinophils Absolute: 0.1 10*3/uL (ref 0.0–0.5)
Eosinophils Relative: 1 %
HCT: 46.9 % (ref 39.0–52.0)
Hemoglobin: 15.5 g/dL (ref 13.0–17.0)
Immature Granulocytes: 0 %
Lymphocytes Relative: 15 %
Lymphs Abs: 2 10*3/uL (ref 0.7–4.0)
MCH: 30.3 pg (ref 26.0–34.0)
MCHC: 33 g/dL (ref 30.0–36.0)
MCV: 91.6 fL (ref 80.0–100.0)
Monocytes Absolute: 1.1 10*3/uL — ABNORMAL HIGH (ref 0.1–1.0)
Monocytes Relative: 8 %
Neutro Abs: 9.8 10*3/uL — ABNORMAL HIGH (ref 1.7–7.7)
Neutrophils Relative %: 75 %
Platelets: 205 10*3/uL (ref 150–400)
RBC: 5.12 MIL/uL (ref 4.22–5.81)
RDW: 13.2 % (ref 11.5–15.5)
WBC: 13.1 10*3/uL — ABNORMAL HIGH (ref 4.0–10.5)
nRBC: 0 % (ref 0.0–0.2)

## 2019-06-28 LAB — LIPID PANEL
Cholesterol: 133 mg/dL (ref 0–200)
HDL: 41 mg/dL (ref >40)
LDL Cholesterol: 72 mg/dL (ref 0–99)
Total CHOL/HDL Ratio: 3.2 RATIO
Triglycerides: 99 mg/dL (ref <150)
VLDL: 20 mg/dL (ref 0–40)

## 2019-06-28 LAB — COMPREHENSIVE METABOLIC PANEL
ALT: 32 U/L (ref 0–44)
AST: 32 U/L (ref 15–41)
Albumin: 4.1 g/dL (ref 3.5–5.0)
Alkaline Phosphatase: 84 U/L (ref 38–126)
Anion gap: 14 (ref 5–15)
BUN: 16 mg/dL (ref 8–23)
CO2: 21 mmol/L — ABNORMAL LOW (ref 22–32)
Calcium: 9.2 mg/dL (ref 8.9–10.3)
Chloride: 106 mmol/L (ref 98–111)
Creatinine, Ser: 1.48 mg/dL — ABNORMAL HIGH (ref 0.61–1.24)
GFR calc Af Amer: 55 mL/min — ABNORMAL LOW (ref >60)
GFR calc non Af Amer: 47 mL/min — ABNORMAL LOW (ref >60)
Glucose, Bld: 130 mg/dL — ABNORMAL HIGH (ref 70–99)
Potassium: 4 mmol/L (ref 3.5–5.1)
Sodium: 141 mmol/L (ref 135–145)
Total Bilirubin: 1 mg/dL (ref 0.3–1.2)
Total Protein: 7 g/dL (ref 6.5–8.1)

## 2019-06-28 LAB — TROPONIN I (HIGH SENSITIVITY): Troponin I (High Sensitivity): 12 ng/L (ref <18)

## 2019-06-28 LAB — PROTIME-INR
INR: 1.1 (ref 0.8–1.2)
Prothrombin Time: 14.1 seconds (ref 11.4–15.2)

## 2019-06-28 LAB — APTT: aPTT: 26 seconds (ref 24–36)

## 2019-06-28 LAB — SARS CORONAVIRUS 2 BY RT PCR (HOSPITAL ORDER, PERFORMED IN ~~LOC~~ HOSPITAL LAB): SARS Coronavirus 2: NEGATIVE

## 2019-06-28 SURGERY — CORONARY/GRAFT ACUTE MI REVASCULARIZATION
Anesthesia: LOCAL

## 2019-06-28 MED ORDER — MORPHINE SULFATE (PF) 2 MG/ML IV SOLN
2.0000 mg | INTRAVENOUS | Status: DC | PRN
  Administered 2019-06-29 (×2): 2 mg via INTRAVENOUS
  Filled 2019-06-28 (×2): qty 1

## 2019-06-28 MED ORDER — AMIODARONE HCL IN DEXTROSE 360-4.14 MG/200ML-% IV SOLN
30.0000 mg/h | INTRAVENOUS | Status: DC
  Administered 2019-06-28: 30 mg/h via INTRAVENOUS

## 2019-06-28 MED ORDER — NOREPINEPHRINE BITARTRATE 1 MG/ML IV SOLN
INTRAVENOUS | Status: AC | PRN
  Administered 2019-06-28: 10 ug/min via INTRAVENOUS

## 2019-06-28 MED ORDER — SODIUM CHLORIDE 0.9% FLUSH
3.0000 mL | Freq: Two times a day (BID) | INTRAVENOUS | Status: DC
  Administered 2019-06-29 – 2019-07-01 (×5): 3 mL via INTRAVENOUS

## 2019-06-28 MED ORDER — FUROSEMIDE 10 MG/ML IJ SOLN
INTRAMUSCULAR | Status: AC
  Filled 2019-06-28: qty 4

## 2019-06-28 MED ORDER — ONDANSETRON HCL 4 MG/2ML IJ SOLN: 4.0000 mg | Freq: Four times a day (QID) | INTRAMUSCULAR | Status: DC | PRN

## 2019-06-28 MED ORDER — HEPARIN SODIUM (PORCINE) 1000 UNIT/ML IJ SOLN
INTRAMUSCULAR | Status: AC
  Filled 2019-06-28: qty 1

## 2019-06-28 MED ORDER — AMIODARONE HCL IN DEXTROSE 360-4.14 MG/200ML-% IV SOLN: 60.0000 mg/h | INTRAVENOUS | Status: DC

## 2019-06-28 MED ORDER — CHLORHEXIDINE GLUCONATE CLOTH 2 % EX PADS
6.0000 | MEDICATED_PAD | Freq: Every day | CUTANEOUS | Status: DC
  Administered 2019-06-29: 6 via TOPICAL

## 2019-06-28 MED ORDER — LABETALOL HCL 5 MG/ML IV SOLN: 10.0000 mg | INTRAVENOUS | Status: AC | PRN

## 2019-06-28 MED ORDER — TICAGRELOR 90 MG PO TABS
ORAL_TABLET | ORAL | Status: AC
  Filled 2019-06-28: qty 1

## 2019-06-28 MED ORDER — VERAPAMIL HCL 2.5 MG/ML IV SOLN
INTRA_ARTERIAL | Status: DC | PRN
  Administered 2019-06-28: 21:00:00 10 mL via INTRA_ARTERIAL

## 2019-06-28 MED ORDER — SODIUM CHLORIDE 0.9 % IV SOLN
INTRAVENOUS | Status: AC | PRN
  Administered 2019-06-28: 250 mL via INTRAVENOUS

## 2019-06-28 MED ORDER — ZOLPIDEM TARTRATE 5 MG PO TABS: 5.0000 mg | ORAL_TABLET | Freq: Every evening | ORAL | Status: DC | PRN

## 2019-06-28 MED ORDER — SODIUM CHLORIDE 0.9 % IV SOLN
INTRAVENOUS | Status: DC
  Administered 2019-06-28: 21:00:00 via INTRAVENOUS

## 2019-06-28 MED ORDER — NITROGLYCERIN 1 MG/10 ML FOR IR/CATH LAB
INTRA_ARTERIAL | Status: AC
  Filled 2019-06-28: qty 10

## 2019-06-28 MED ORDER — ONDANSETRON HCL 4 MG/2ML IJ SOLN
INTRAMUSCULAR | Status: AC
  Filled 2019-06-28: qty 2

## 2019-06-28 MED ORDER — HEPARIN SODIUM (PORCINE) 5000 UNIT/ML IJ SOLN
4000.0000 [IU] | Freq: Once | INTRAMUSCULAR | Status: AC
  Administered 2019-06-28: 4000 [IU] via INTRAVENOUS

## 2019-06-28 MED ORDER — ACETAMINOPHEN 325 MG PO TABS: 650.0000 mg | ORAL_TABLET | ORAL | Status: DC | PRN

## 2019-06-28 MED ORDER — HEPARIN (PORCINE) IN NACL 1000-0.9 UT/500ML-% IV SOLN
INTRAVENOUS | Status: AC
  Filled 2019-06-28: qty 500

## 2019-06-28 MED ORDER — TICAGRELOR 90 MG PO TABS
ORAL_TABLET | ORAL | Status: DC | PRN
  Administered 2019-06-28: 180 mg via ORAL

## 2019-06-28 MED ORDER — ATORVASTATIN CALCIUM 40 MG PO TABS: 40.0000 mg | ORAL_TABLET | Freq: Every day | ORAL | Status: DC

## 2019-06-28 MED ORDER — LIDOCAINE HCL (PF) 1 % IJ SOLN
INTRAMUSCULAR | Status: AC
  Filled 2019-06-28: qty 30

## 2019-06-28 MED ORDER — LIDOCAINE HCL (PF) 1 % IJ SOLN
INTRAMUSCULAR | Status: DC | PRN
  Administered 2019-06-28: 2 mL

## 2019-06-28 MED ORDER — ASPIRIN 81 MG PO CHEW
81.0000 mg | CHEWABLE_TABLET | Freq: Every day | ORAL | Status: DC
  Administered 2019-06-29 – 2019-07-01 (×3): 81 mg via ORAL
  Filled 2019-06-28 (×3): qty 1

## 2019-06-28 MED ORDER — SODIUM CHLORIDE 0.9 % IV SOLN: 250.0000 mL | INTRAVENOUS | Status: DC | PRN

## 2019-06-28 MED ORDER — AMIODARONE HCL 150 MG/3ML IV SOLN
INTRAVENOUS | Status: DC | PRN
  Administered 2019-06-28: 300 mg via INTRAVENOUS

## 2019-06-28 MED ORDER — SODIUM CHLORIDE 0.9 % IV SOLN
INTRAVENOUS | Status: AC
  Administered 2019-06-28: 23:00:00 via INTRAVENOUS

## 2019-06-28 MED ORDER — ONDANSETRON HCL 4 MG/2ML IJ SOLN
4.0000 mg | Freq: Once | INTRAMUSCULAR | Status: AC
  Administered 2019-06-28: 4 mg via INTRAVENOUS

## 2019-06-28 MED ORDER — AMIODARONE HCL 150 MG/3ML IV SOLN
INTRAVENOUS | Status: AC
  Filled 2019-06-28: qty 3

## 2019-06-28 MED ORDER — NOREPINEPHRINE 4 MG/250ML-% IV SOLN
INTRAVENOUS | Status: AC
  Filled 2019-06-28: qty 250

## 2019-06-28 MED ORDER — NITROGLYCERIN 0.4 MG SL SUBL: 0.4000 mg | SUBLINGUAL_TABLET | SUBLINGUAL | Status: DC | PRN

## 2019-06-28 MED ORDER — IOHEXOL 350 MG/ML SOLN
INTRAVENOUS | Status: DC | PRN
  Administered 2019-06-28: 230 mL via INTRAVENOUS

## 2019-06-28 MED ORDER — ALPRAZOLAM 0.25 MG PO TABS: 0.2500 mg | ORAL_TABLET | Freq: Two times a day (BID) | ORAL | Status: DC | PRN

## 2019-06-28 MED ORDER — HEPARIN (PORCINE) IN NACL 1000-0.9 UT/500ML-% IV SOLN
INTRAVENOUS | Status: DC | PRN
  Administered 2019-06-28 (×2): 500 mL

## 2019-06-28 MED ORDER — ASPIRIN EC 81 MG PO TBEC: 81.0000 mg | DELAYED_RELEASE_TABLET | Freq: Every day | ORAL | Status: DC

## 2019-06-28 MED ORDER — TICAGRELOR 90 MG PO TABS
90.0000 mg | ORAL_TABLET | Freq: Two times a day (BID) | ORAL | Status: DC
  Administered 2019-06-29 – 2019-07-01 (×5): 90 mg via ORAL
  Filled 2019-06-28 (×5): qty 1

## 2019-06-28 MED ORDER — ASPIRIN 81 MG PO CHEW: 324.0000 mg | CHEWABLE_TABLET | Freq: Once | ORAL | Status: AC

## 2019-06-28 MED ORDER — FUROSEMIDE 10 MG/ML IJ SOLN
INTRAMUSCULAR | Status: DC | PRN
  Administered 2019-06-28: 20 mg via INTRAVENOUS

## 2019-06-28 MED ORDER — HYDRALAZINE HCL 20 MG/ML IJ SOLN: 10.0000 mg | INTRAMUSCULAR | Status: AC | PRN

## 2019-06-28 MED ORDER — VERAPAMIL HCL 2.5 MG/ML IV SOLN
INTRAVENOUS | Status: AC
  Filled 2019-06-28: qty 2

## 2019-06-28 MED ORDER — SODIUM CHLORIDE 0.9% FLUSH: 3.0000 mL | INTRAVENOUS | Status: DC | PRN

## 2019-06-28 MED ORDER — HEPARIN SODIUM (PORCINE) 1000 UNIT/ML IJ SOLN
INTRAMUSCULAR | Status: DC | PRN
  Administered 2019-06-28: 7000 [IU] via INTRAVENOUS
  Administered 2019-06-28: 4000 [IU] via INTRAVENOUS

## 2019-06-28 SURGICAL SUPPLY — 26 items
BALLN EMERGE MR 2.0X12 (BALLOONS) ×2
BALLN SAPPHIRE ~~LOC~~ 2.75X10 (BALLOONS) ×1 IMPLANT
BALLN SAPPHIRE ~~LOC~~ 3.0X12 (BALLOONS) ×1 IMPLANT
BALLOON EMERGE MR 2.0X12 (BALLOONS) IMPLANT
BAND CMPR LRG ZPHR (HEMOSTASIS) ×1
BAND ZEPHYR COMPRESS 30 LONG (HEMOSTASIS) ×1 IMPLANT
CATH 5FR JL3.5 JR4 ANG PIG MP (CATHETERS) ×1 IMPLANT
CATH LAUNCHER 6FR EBU3.5 (CATHETERS) ×1 IMPLANT
CATH OPTITORQUE TIG 4.0 5F (CATHETERS) ×1 IMPLANT
CATH VISTA GUIDE 6FR XBLAD3.5 (CATHETERS) ×2 IMPLANT
GLIDESHEATH SLEND A-KIT 6F 22G (SHEATH) ×1 IMPLANT
GUIDEWIRE INQWIRE 1.5J.035X260 (WIRE) IMPLANT
INQWIRE 1.5J .035X260CM (WIRE) ×2
KIT ENCORE 26 ADVANTAGE (KITS) ×1 IMPLANT
KIT HEART LEFT (KITS) ×2 IMPLANT
PACK CARDIAC CATHETERIZATION (CUSTOM PROCEDURE TRAY) ×2 IMPLANT
SHEATH PINNACLE 6F 10CM (SHEATH) ×1 IMPLANT
STENT RESOLUTE ONYX 2.75X8 (Permanent Stent) ×1 IMPLANT
STENT SYNERGY DES 2.50X8 (Permanent Stent) ×1 IMPLANT
STENT SYNERGY DES 2.5X24 (Permanent Stent) ×1 IMPLANT
SYR MEDRAD MARK 7 150ML (SYRINGE) ×2 IMPLANT
TRANSDUCER W/STOPCOCK (MISCELLANEOUS) ×2 IMPLANT
TUBING CIL FLEX 10 FLL-RA (TUBING) ×2 IMPLANT
WIRE ASAHI PROWATER 180CM (WIRE) ×1 IMPLANT
WIRE EMERALD 3MM-J .035X150CM (WIRE) ×1 IMPLANT
WIRE HI TORQ VERSACORE-J 145CM (WIRE) ×1 IMPLANT

## 2019-06-28 NOTE — ED Notes (Signed)
To Cath lab 

## 2019-06-28 NOTE — Consult Note (Signed)
Responded to page re: STEMI. Pt unavailable, initially no family present, EMS said wife may not follow, as fireman was explaining covid visiting policy to her when they left. Later visited with wife in consult rm A as she awaited doctor's report after pt's visit to cath lab. Provided ministry of presence and emotional support. She was overall in good spirits, and shared how they'd been playing piffle ball, but it was so hot, she'd stopped and her husband had not. She thanked me for conversation, and will let staff know if further chaplain services are desired.   Rev. Eloise Levels Chaplain

## 2019-06-28 NOTE — H&P (Signed)
Cardiology Admission History and Physical:   Patient ID: John Mccarthy; MRN: [id number]; DOB: January 09, 1951   Admission date: 06/28/2019  Primary Care Provider: Janith Lima, MD Primary Cardiologist: Quay Burow, MD   Chief Complaint:  Chest discomfort  History of Present Illness:   John Mccarthy is a 68 y.o. male with a history of coronary artery disease (with STEMI in March 2018 and PCI of the ostial LAD with DES Synergy 3.0 x 57m), hyperlipidemia, BPH and melanoma who presents to the hospital with complaints of substernal chest pain. The patient did well after his initial MI in 2018 and was compliant with his dual antiplatelet therapy. His Plavix was discontinued recently. He has been very active without any provocation of symptoms. Today he was out in the sun and was sweating profusely while engaged in physical activity. When he returned home he developed 10/10 pressure-like substernal chest pain (starting at approximately 7:30 pm). He called EMS and was documented to have an anterior wall STEMI. He had associated SOB and nausea. He denied any vomiting. There was no radiation of the chest pain.  In the ED the ECG revealed ST elevations in V1 and V2 with peaked T waves in V2, I and aVL with ST depressions in III and aVF. There was normal sinus rhythm with a rate of 93 bpm. The CXR was unremarkable. He was treated with ASA, IV heparin, NTG and Zofran.  The patient denied any fevers, chills, sick contacts or [place] A COVID test was performed in the ED (results are pending).    Past Medical History:  Diagnosis Date  . BPH (benign prostatic hyperplasia)   . Hyperlipidemia   . [place]   . [place]   . [place]ism ([place] 06/15/2013  . [facility]melanoma) ([facility]     Past Surgical History:  Procedure Laterality Date  . ACHILLES TENDON SURGERY Right 05/18/2013  . CARDIAC CATHETERIZATION    . CORONARY STENT  INTERVENTION N/A 02/11/2017   Procedure: Coronary Stent Intervention;  Surgeon: JLorretta Harp MD;  [facility];  Service: Cardiovascular;  Laterality: N/A;  . ESOPHAGOGASTRODUODENOSCOPY  09/24/2012   Procedure: ESOPHAGOGASTRODUODENOSCOPY (EGD);  Surgeon: VLear Ng MD;  [facility];  Service: Endoscopy;  Laterality: N/A;  . HAND SURGERY    . LEFT HEART CATH AND CORONARY ANGIOGRAPHY N/A 02/11/2017   Procedure: Left Heart Cath and Coronary Angiography;  Surgeon: JLorretta Harp MD;  [facility];  Service: Cardiovascular;  Laterality: N/A;     Medications Prior to Admission: Prior to Admission medications   Medication Sig Start Date End Date Taking? Authorizing Provider  aspirin EC 81 MG tablet Take 1 tablet (81 mg total) by mouth daily. 05/31/15   JJanith Lima MD  atorvastatin (LIPITOR) 40 MG tablet TAKE 1 TABLET EVERY DAY AT 6PM 11/03/18   BLorretta Harp MD  clopidogrel (PLAVIX) 75 MG tablet Take 1 tablet (75 mg total) by mouth daily. NEEDS APPOINTMENT FOR FUTURE REFILLS Patient not taking: Reported on 03/03/2019 01/28/19   BLorretta Harp MD  nitroGLYCERIN (NITROSTAT) 0.4 MG SL tablet Place 1 tablet (0.4 mg total) under the tongue every 5 (five) minutes as needed for chest pain. Patient not taking: Reported on 03/03/2019 04/14/17   BLorretta Harp MD  Testosterone 12.5 MG/ACT (1%) GEL Place 1 Act onto the skin daily. Patient not taking: Reported on 03/03/2019 07/12/18   JJanith Lima  MD     Allergies:    Allergies  Allergen Reactions  . Crestor [Rosuvastatin Calcium] Other (See Comments)    Muscle aches  . Lactose Intolerance (Gi) Diarrhea    Social History:   Social History   Socioeconomic History  . Marital status: Married    Spouse name: Not on file  . Number of children: Not on file  . Years of education: Not on file  . Highest education level: Not on file  Occupational History  . Not on file  Social Needs   . Financial resource strain: Not on file  . Food insecurity    Worry: Not on file    Inability: Not on file  . Transportation needs    Medical: Not on file    Non-medical: Not on file  Tobacco Use  . Smoking status: Never Smoker  . Smokeless tobacco: Never Used  Substance and Sexual Activity  . Alcohol use: Yes    Alcohol/week: 5.0 standard drinks    Types: 5 Shots of liquor per week    Comment: occassional  . Drug use: No  . Sexual activity: Yes    Birth control/protection: None  Lifestyle  . Physical activity    Days per week: Not on file    Minutes per session: Not on file  . Stress: Not on file  Relationships  . Social Herbalist on phone: Not on file    Gets together: Not on file    Attends religious service: Not on file    Active member of club or organization: Not on file    Attends meetings of clubs or organizations: Not on file    Relationship status: Not on file  . Intimate partner violence    Fear of current or ex partner: Not on file    Emotionally abused: Not on file    Physically abused: Not on file    Forced sexual activity: Not on file  Other Topics Concern  . Not on file  Social History Narrative  . Not on file     Family History:  The patient's family history includes Coronary artery disease (age of onset: 29) in his sister; Heart failure in his father. There is no history of Cancer, COPD, Stroke, Kidney disease, Hypertension, Hyperlipidemia, Heart disease, Hearing loss, Early death, Drug abuse, Diabetes, or Depression.     Review of Systems: [y] = yes, _0  = no   . General: Weight gain _1 ; Weight loss _2 ; Anorexia _3 ; Fatigue _4 ; Fever _5 ; Chills _6 ; Weakness _7   . Cardiac: Chest pain/pressure [Y ]; Resting SOB [ Y]; Exertional SOB _8 ; Orthopnea _9 ; Pedal Edema _10 ; Palpitations _11 ; Syncope _12 ; Presyncope _13 ; Paroxysmal nocturnal dyspnea_14   . Pulmonary: Cough _15 ; Wheezing_16 ; Hemoptysis_17 ; Sputum _18 ; Snoring _19   . GI:  Vomiting_20 ; Dysphagia_21 ; Melena_22 ; Hematochezia _23 ; Heartburn_24 ; Abdominal pain _25 ; Constipation _26 ; Diarrhea _27 ; BRBPR _28   . GU: Hematuria_29 ; Dysuria _30 ; Nocturia_31   . Vascular: Pain in legs with walking _32 ; Pain in feet with lying flat _33 ; Non-healing sores _34 ; Stroke _35 ; TIA _36 ; Slurred speech _37 ;  . Neuro: Headaches_38 ; Vertigo_39 ; Seizures_40 ; Paresthesias_41 ;Blurred vision _42 ; Diplopia _43 ; Vision changes _44   . Ortho/Skin: Arthritis _45 ; Joint pain _46 ; Muscle pain _47 ;  Joint swelling '[ ]'$ ; Back Pain '[ ]'$ ; Rash '[ ]'$   . Psych: Depression'[ ]'$ ; Anxiety'[ ]'$   . Heme: Bleeding problems '[ ]'$ ; Clotting disorders '[ ]'$ ; Anemia '[ ]'$   . Endocrine: Diabetes '[ ]'$ ; Thyroid dysfunction'[ ]'$      Physical Exam/Data:   Vitals:   06/28/19 2051 06/28/19 2056  BP: 103/67 122/74  Pulse: 92 97  Resp: 18 (!) 21  SpO2: 93% (!) 89%   No intake or output data in the 24 hours ending 06/28/19 2111 There were no vitals filed for this visit. There is no height or weight on file to calculate BMI.  General:  Well nourished, in moderate discomfort (10/10 chest pain) HEENT: normal Lymph: no adenopathy Neck: no JVD Endocrine:  No thryomegaly Vascular: No carotid bruits; FA pulses 2+ bilaterally without bruits  Cardiac:  normal S1, S2; RRR; no murmur  Lungs:  clear to auscultation bilaterally, no wheezing, rhonchi or rales  Abd: soft, nontender, no hepatomegaly  Ext: no edema Musculoskeletal:  No deformities, BUE and BLE strength normal and equal Skin: warm and dry  Neuro:  CNs 2-12 intact, no focal abnormalities noted Psych:  Normal affect    EKG:  The ECG that was done today was personally reviewed and demonstrates ST elevations in V1 and V2 with peaked T waves in V2, I and aVL with ST depressions in III and aVF. There is normal sinus rhythm with a rate of 93 bpm.    Laboratory Data:  ChemistryNo results for input(s): NA, K, CL, CO2, GLUCOSE, BUN, CREATININE, CALCIUM, GFRNONAA, GFRAA, ANIONGAP  in the last 168 hours.  No results for input(s): PROT, ALBUMIN, AST, ALT, ALKPHOS, BILITOT in the last 168 hours. Hematology Recent Labs  Lab 06/28/19 2057  WBC 13.1*  RBC 5.12  HGB 15.5  HCT 46.9  MCV 91.6  MCH 30.3  MCHC 33.0  RDW 13.2  PLT 205   Cardiac EnzymesNo results for input(s): TROPONINI in the last 168 hours. No results for input(s): TROPIPOC in the last 168 hours.  BNPNo results for input(s): BNP, PROBNP in the last 168 hours.  DDimer No results for input(s): DDIMER in the last 168 hours.  Radiology/Studies:  Dg Chest Portable 1 View  Result Date: 06/28/2019 CLINICAL DATA:  STEMI EXAM: PORTABLE CHEST 1 VIEW COMPARISON:  02/11/2017, CT chest 05/23/2015 FINDINGS: Low lung volumes. Chronic elevation of right diaphragm. Possible small pleural effusions. Stable cardiomediastinal silhouette. No pneumothorax. IMPRESSION: Low lung volumes with possible small pleural effusions. Electronically Signed   By: Donavan Foil M.D.   On: 06/28/2019 21:04    Assessment and Plan:   1. ST Elevation Myocardial Infarction  The patient has a known history of CAD with a prior STEMI in March 2018 status post stenting of the ostial LAD with a DES. Since then he has done reasonably well and is physically active. Today he developed chest pain in the evening and in the ED his ECG revealed ST elevations in the anterior leads with ST depressions in the inferior leads. Cardiac auscultation was unremarkable.   - ASA '324mg'$  x 1 - CXR without cardiomegaly/widened mediastinum - given 4000 units of IV heparin - High dose statins, check a lipid panel - IV Zofran for nausea - Continue to cycle cardiac biomarkers, obtain serial ECGs - Emergent cardiac catheterization to define coronary anatomy +/- PCI of culprit vessel    Severity of Illness: The appropriate patient status for this patient is INPATIENT. Inpatient status is judged to be reasonable and  necessary in order to provide the required intensity  of service to ensure the patient's safety. The patient's presenting symptoms, physical exam findings, and initial radiographic and laboratory data in the context of their chronic comorbidities is felt to place them at high risk for further clinical deterioration. Furthermore, it is not anticipated that the patient will be medically stable for discharge from the hospital within 2 midnights of admission. The following factors support the patient status of inpatient.   " The patient's presenting symptoms include chest pain. " The physical exam findings include normal cardiac auscultation but low normal blood pressure. " The initial radiographic and laboratory data are worrisome because of ST elevations on ECG. " The chronic co-morbidities include hyperlipidemia.   * I certify that at the point of admission it is my clinical judgment that the patient will require inpatient hospital care spanning beyond 2 midnights from the point of admission due to high intensity of service, high risk for further deterioration and high frequency of surveillance required.*    For questions or updates, please contact [place] Please consult [email or web address] for contact info under Cardiology/STEMI.    Signed, Meade Maw, MD  06/28/2019 9:11 PM

## 2019-06-29 ENCOUNTER — Encounter (HOSPITAL_COMMUNITY): Payer: Self-pay | Admitting: Cardiovascular Disease

## 2019-06-29 ENCOUNTER — Inpatient Hospital Stay (HOSPITAL_COMMUNITY): Payer: Medicare Other

## 2019-06-29 ENCOUNTER — Other Ambulatory Visit: Payer: Self-pay

## 2019-06-29 DIAGNOSIS — R079 Chest pain, unspecified: Secondary | ICD-10-CM

## 2019-06-29 DIAGNOSIS — E785 Hyperlipidemia, unspecified: Secondary | ICD-10-CM

## 2019-06-29 LAB — CBC
HCT: 46.6 % (ref 39.0–52.0)
Hemoglobin: 15.9 g/dL (ref 13.0–17.0)
MCH: 30.9 pg (ref 26.0–34.0)
MCHC: 34.1 g/dL (ref 30.0–36.0)
MCV: 90.5 fL (ref 80.0–100.0)
Platelets: 233 10*3/uL (ref 150–400)
RBC: 5.15 MIL/uL (ref 4.22–5.81)
RDW: 13.2 % (ref 11.5–15.5)
WBC: 18.8 10*3/uL — ABNORMAL HIGH (ref 4.0–10.5)
nRBC: 0 % (ref 0.0–0.2)

## 2019-06-29 LAB — LIPID PANEL
Cholesterol: 143 mg/dL (ref 0–200)
HDL: 44 mg/dL (ref >40)
LDL Cholesterol: 93 mg/dL (ref 0–99)
Total CHOL/HDL Ratio: 3.3 RATIO
Triglycerides: 28 mg/dL (ref <150)
VLDL: 6 mg/dL (ref 0–40)

## 2019-06-29 LAB — BASIC METABOLIC PANEL
Anion gap: 17 — ABNORMAL HIGH (ref 5–15)
BUN: 17 mg/dL (ref 8–23)
CO2: 20 mmol/L — ABNORMAL LOW (ref 22–32)
Calcium: 8.8 mg/dL — ABNORMAL LOW (ref 8.9–10.3)
Chloride: 102 mmol/L (ref 98–111)
Creatinine, Ser: 1.39 mg/dL — ABNORMAL HIGH (ref 0.61–1.24)
GFR calc Af Amer: 59 mL/min — ABNORMAL LOW (ref >60)
GFR calc non Af Amer: 51 mL/min — ABNORMAL LOW (ref >60)
Glucose, Bld: 158 mg/dL — ABNORMAL HIGH (ref 70–99)
Potassium: 3.7 mmol/L (ref 3.5–5.1)
Sodium: 139 mmol/L (ref 135–145)

## 2019-06-29 LAB — MRSA PCR SCREENING: MRSA by PCR: NEGATIVE

## 2019-06-29 LAB — ECHOCARDIOGRAM COMPLETE: Weight: 3326.3 oz

## 2019-06-29 LAB — PROTIME-INR
INR: 1.3 — ABNORMAL HIGH (ref 0.8–1.2)
Prothrombin Time: 15.6 seconds — ABNORMAL HIGH (ref 11.4–15.2)

## 2019-06-29 LAB — TROPONIN I (HIGH SENSITIVITY)
Troponin I (High Sensitivity): 12303 ng/L (ref <18)
Troponin I (High Sensitivity): >27000 ng/L (ref <18)

## 2019-06-29 LAB — POCT ACTIVATED CLOTTING TIME
Activated Clotting Time: 213 seconds
Activated Clotting Time: 312 seconds
Activated Clotting Time: 263 seconds
Activated Clotting Time: 169 seconds

## 2019-06-29 LAB — HIV ANTIBODY (ROUTINE TESTING W REFLEX): HIV Screen 4th Generation wRfx: NONREACTIVE

## 2019-06-29 MED ORDER — AMIODARONE HCL IN DEXTROSE 360-4.14 MG/200ML-% IV SOLN
30.0000 mg/h | INTRAVENOUS | Status: DC
  Administered 2019-06-29 – 2019-06-30 (×4): 30 mg/h via INTRAVENOUS
  Filled 2019-06-29 (×3): qty 200

## 2019-06-29 MED ORDER — CARVEDILOL 3.125 MG PO TABS
3.1250 mg | ORAL_TABLET | Freq: Two times a day (BID) | ORAL | Status: DC
  Administered 2019-06-29 – 2019-07-01 (×4): 3.125 mg via ORAL
  Filled 2019-06-29 (×4): qty 1

## 2019-06-29 MED ORDER — PERFLUTREN LIPID MICROSPHERE
1.0000 mL | INTRAVENOUS | Status: AC | PRN
  Administered 2019-06-29: 3 mL via INTRAVENOUS
  Filled 2019-06-29: qty 10

## 2019-06-29 MED ORDER — ATROPINE SULFATE 1 MG/10ML IJ SOSY
PREFILLED_SYRINGE | INTRAMUSCULAR | Status: AC
  Filled 2019-06-29: qty 10

## 2019-06-29 MED ORDER — ATORVASTATIN CALCIUM 80 MG PO TABS
80.0000 mg | ORAL_TABLET | Freq: Every day | ORAL | Status: DC
  Administered 2019-06-29 – 2019-06-30 (×2): 80 mg via ORAL
  Filled 2019-06-29 (×2): qty 1

## 2019-06-29 MED FILL — Furosemide Inj 10 MG/ML: INTRAMUSCULAR | Qty: 4 | Status: AC

## 2019-06-29 NOTE — Progress Notes (Signed)
Progress Note  Patient Name: John Mccarthy Date of Encounter: 06/29/2019  Primary Cardiologist: Dr. Gwenlyn Found  Subjective   Had residual chest pain last evening and this am; now essentially resolved  Inpatient Medications    Scheduled Meds: . aspirin  81 mg Oral Daily  . atorvastatin  40 mg Oral q1800  . Chlorhexidine Gluconate Cloth  6 each Topical Daily  . sodium chloride flush  3 mL Intravenous Q12H  . ticagrelor  90 mg Oral BID   Continuous Infusions: . sodium chloride 10 mL/hr at 06/29/19 0700  . sodium chloride 75 mL/hr at 06/29/19 0700  . sodium chloride    . amiodarone 30 mg/hr (06/29/19 0756)   PRN Meds: sodium chloride, acetaminophen, ALPRAZolam, morphine injection, nitroGLYCERIN, ondansetron (ZOFRAN) IV, sodium chloride flush, zolpidem   Vital Signs    Vitals:   06/29/19 0600 06/29/19 0630 06/29/19 0700 06/29/19 0737  BP: 104/63 106/65 99/69   Pulse: 74 74 74   Resp: (!) 22 14 (!) 22   Temp:    98.8 F (37.1 C)  TempSrc:    Oral  SpO2: 95% 98% 97%   Weight:        Intake/Output Summary (Last 24 hours) at 06/29/2019 0800 Last data filed at 06/29/2019 0700 Gross per 24 hour  Intake 1120.21 ml  Output 1275 ml  Net -154.79 ml    I/O since admission: -[place]   06/28/19 2124 06/29/19 0535  Weight: 91.6 kg 94.3 kg    Telemetry    Sinus in the 70s - Personally Reviewed  ECG    ECG (independently read by me): NSR 75, isolated PVC  Physical Exam   BP 99/69   Pulse 74   Temp 98.8 F (37.1 C) (Oral)   Resp (!) 22   Wt 94.3 kg   SpO2 97%   BMI 28.20 kg/m  General: Alert, oriented, no distress.  Skin: normal turgor, no rashes, warm and dry HEENT: Normocephalic, atraumatic. Pupils equal round and reactive to light; sclera anicteric; extraocular muscles intact;  Nose without nasal septal hypertrophy Mouth/Parynx benign;  Neck: No JVD, no carotid bruits; normal carotid upstroke Lungs: clear to ausculatation and percussion; no  wheezing or rales Chest wall: without tenderness to palpitation Heart: PMI not displaced, RRR, s1 s2 normal, 1/6 systolic murmur, no diastolic murmur, no rubs, gallops, thrills, or heaves Abdomen: soft, nontender; no hepatosplenomehaly, BS+; abdominal aorta nontender and not dilated by palpation. Back: no CVA tenderness Pulses 2+ R greoin and radial sites stable Musculoskeletal: full range of motion, normal strength, no joint deformities Extremities: no clubbing cyanosis or edema, Homan's sign negative  Neurologic: grossly nonfocal; Cranial nerves grossly wnl Psychologic: Normal mood and affect   Labs    Chemistry Recent Labs  Lab 06/28/19 2057 06/29/19 0002  NA 141 139  K 4.0 3.7  CL 106 102  CO2 21* 20*  GLUCOSE 130* 158*  BUN 16 17  CREATININE 1.48* 1.39*  CALCIUM 9.2 8.8*  PROT 7.0  --   ALBUMIN 4.1  --   AST 32  --   ALT 32  --   ALKPHOS 84  --   BILITOT 1.0  --   GFRNONAA 47* 51*  GFRAA 55* 59*  ANIONGAP 14 17*     Hematology Recent Labs  Lab 06/28/19 2057 06/29/19 0002  WBC 13.1* 18.8*  RBC 5.12 5.15  HGB 15.5 15.9  HCT 46.9 46.6  MCV 91.6 90.5  MCH 30.3 30.9  MCHC  33.0 34.1  RDW 13.2 13.2  PLT 205 233   HS   Tropinin 12,303  Cardiac EnzymesNo results for input(s): TROPONINI in the last 168 hours. No results for input(s): TROPIPOC in the last 168 hours.   BNPNo results for input(s): BNP, PROBNP in the last 168 hours.   DDimer No results for input(s): DDIMER in the last 168 hours.   Lipid Panel     Component Value Date/Time   CHOL 143 06/29/2019 0002   TRIG 28 06/29/2019 0002   HDL 44 06/29/2019 0002   CHOLHDL 3.3 06/29/2019 0002   VLDL 6 06/29/2019 0002   LDLCALC 93 06/29/2019 0002   LDLDIRECT 146.0 10/15/2016 1517     Radiology    Dg Chest Portable 1 View  Result Date: 06/28/2019 CLINICAL DATA:  STEMI EXAM: PORTABLE CHEST 1 VIEW COMPARISON:  02/11/2017, CT chest 05/23/2015 FINDINGS: Low lung volumes. Chronic elevation of right  diaphragm. Possible small pleural effusions. Stable cardiomediastinal silhouette. No pneumothorax. IMPRESSION: Low lung volumes with possible small pleural effusions. Electronically Signed   By: Donavan Foil M.D.   On: 06/28/2019 21:04    Cardiac Studies   Cath  The left ventricular systolic function is normal.  LV end diastolic pressure is normal.  The left ventricular ejection fraction is 50-55% by visual estimate.  Prox RCA lesion is 70% stenosed.  Prox Cx lesion is 40% stenosed.  Ost LAD to Prox LAD lesion is 100% stenosed.  A stent was successfully placed.  Post intervention, there is a 0% residual stenosis.  1st Diag lesion is 99% stenosed.   Intervention     Patient Profile     68 y.o. male who had suffered an anterior STEMI in 01/2017 RX with DES stent to LAD ostium who presented with anterior STEMI 06/28/2019.  Assessment & Plan    1. Day 1 s/p Anterior STEMI;  S/p 3 tandem DES stents inserted from LAD ostium. Now on ASA/brilinta; ECG today stable; echo to be done. Troponin HS 12,303. Add low dose carvedilol as BP allows.  2. Transient VT during procedure: now on amiodarone drip:  Will continue today and probable dc amiodarone tomorrow.  3. Hypotension with reperfusion: now off levophed; BP soft at 97 - 409 systolic  4. Hyperlipidemia: LDL 93: Will increase atorvastatin to 80 mg; if unable to get < 70 then add zetia as outpatient.  5. Mild renal insufficiency: Cr 1.48 > 1.39.  Will keep in CCU today.  Signed, Troy Sine, MD, [facility] 06/29/2019, 8:00 AM

## 2019-06-29 NOTE — Care Management (Signed)
1525 06-29-19 Patient presented for [facility]- post cath. Plan for home on Brilinta. Benefits check in process and will make patient aware of cost once completed. No further needs identified at this time. Duanne Moron Case Manager [phone]

## 2019-06-29 NOTE — Progress Notes (Signed)
Right femoral arterial sheath pulled at 0312 by Melvern Sample RN with Nelva Bush RN assisting. ACT 169. Sheath intact upon removal. Manual pressure held at groin site for 20 minutes after sheath removal. Patient's BP cycled q5 minutes during hold and R pedal pulse monitored throughout. Groin site remained Level 0 throughout hold. Transparent pressure dressing applied to site at end of procedure. Patient educated throughout and tolerated procedure well.   Nelva Bush RN

## 2019-06-29 NOTE — TOC Benefit Eligibility Note (Signed)
Transition of Care [facility]) Benefit Eligibility Note    Patient Details  Name: John Mccarthy MRN: [id number] Date of Birth: 1951-06-16   Medication/Dose: BRILINT  90 MG BID  Covered?: Yes  Tier: 3 Drug  Prescription Coverage Preferred Pharmacy: [facility]  Spoke with Person/Company/Phone Number:: BILLIE  @  [employer] # [phone]  Co-Pay: $ [phone]  Prior Approval: No  Deductible: Unmet       Memory Argue Phone Number: 06/29/2019, 3:56 PM

## 2019-06-29 NOTE — Progress Notes (Signed)
[id number] MI education began. Reviewed NTG use, MI restrictions, gave heart healthy diet, importance of brilinta with stent, CRP 2. Pt attended CRP 2 before and referring to [place].  Will follow up tomorrow for ambulation and ex ed.  Pt is interested in participating in Virtual Cardiac Rehab. Pt advised that Virtual Cardiac Rehab is provided at no cost to the patient.  Checklist:  1. Pt has smart device  ie smartphone and/or ipad for downloading an app  Yes 2. Reliable internet/wifi service    Yes 3. Understands how to use their smartphone and navigate within an app.  Yes   Reviewed with pt the scheduling process for virtual cardiac rehab.  Pt verbalized understanding.

## 2019-06-29 NOTE — Progress Notes (Signed)
  Echocardiogram 2D Echocardiogram has been performed.  Burnett Kanaris 06/29/2019, 9:05 AM

## 2019-06-29 NOTE — Progress Notes (Signed)
Pt in bed in chair position eating breakfast. Physician at beside and echo tech waiting to preform echo. No acute distress noted. Pt educated on process of care and planned events. Admission nurse notified to come complete admission history. Vital signs stable and lines to noted to be infusing properly. Will continue to monitor and keep pt and family updated.

## 2019-06-29 NOTE — Progress Notes (Signed)
CRITICAL VALUE ALERT  Critical Value: Troponin 12,303 ng/L  Date & Time Notied: 06/29/2019 at [place]  Provider Notified: Simone Curia MD  Orders Received/Actions taken: Verbal order for follow-up troponin at 0800 today.   Nelva Bush RN

## 2019-06-30 DIAGNOSIS — I472 Ventricular tachycardia: Secondary | ICD-10-CM

## 2019-06-30 LAB — BASIC METABOLIC PANEL
Anion gap: 11 (ref 5–15)
BUN: 12 mg/dL (ref 8–23)
CO2: 26 mmol/L (ref 22–32)
Calcium: 8.5 mg/dL — ABNORMAL LOW (ref 8.9–10.3)
Chloride: 104 mmol/L (ref 98–111)
Creatinine, Ser: 1.14 mg/dL (ref 0.61–1.24)
GFR calc Af Amer: >60 mL/min (ref >60)
GFR calc non Af Amer: >60 mL/min (ref >60)
Glucose, Bld: 114 mg/dL — ABNORMAL HIGH (ref 70–99)
Potassium: 3.8 mmol/L (ref 3.5–5.1)
Sodium: 141 mmol/L (ref 135–145)

## 2019-06-30 LAB — MAGNESIUM: Magnesium: 2.2 mg/dL (ref 1.7–2.4)

## 2019-06-30 MED ORDER — BISACODYL 5 MG PO TBEC: 5.0000 mg | DELAYED_RELEASE_TABLET | Freq: Every day | ORAL | Status: DC | PRN

## 2019-06-30 MED ORDER — ALUM & MAG HYDROXIDE-SIMETH 200-200-20 MG/5ML PO SUSP
15.0000 mL | ORAL | Status: DC | PRN
  Administered 2019-06-30: 15 mL via ORAL
  Filled 2019-06-30: qty 30

## 2019-06-30 MED ORDER — POTASSIUM CHLORIDE CRYS ER 20 MEQ PO TBCR
20.0000 meq | EXTENDED_RELEASE_TABLET | Freq: Once | ORAL | Status: AC
  Administered 2019-06-30: 20 meq via ORAL
  Filled 2019-06-30: qty 1

## 2019-06-30 NOTE — Discharge Instructions (Signed)

## 2019-06-30 NOTE — Progress Notes (Signed)
CARDIAC REHAB PHASE I   PRE:  Rate/Rhythm: 65 SR  BP:  Supine: 102/70  Sitting:   Standing:    SaO2: 96%RA  MODE:  Ambulation: 740 ft   POST:  Rate/Rhythm: 76 SR  BP:  Supine:   Sitting: 115/72  Standing:    SaO2: 98%RA 1122-1145 Pt walked 740 ft on RA with steady gait and tolerated well. No CP. Ex ed completed with pt who voiced understanding.    Graylon Good, RN BSN  06/30/2019 11:44 AM

## 2019-06-30 NOTE — Progress Notes (Signed)
Progress Note  Patient Name: John Mccarthy Date of Encounter: 06/30/2019  Primary Cardiologist: Dr. Gwenlyn Found  Subjective   No ecurrent chest pain  Inpatient Medications    Scheduled Meds: . aspirin  81 mg Oral Daily  . atorvastatin  80 mg Oral q1800  . carvedilol  3.125 mg Oral BID WC  . Chlorhexidine Gluconate Cloth  6 each Topical Daily  . sodium chloride flush  3 mL Intravenous Q12H  . ticagrelor  90 mg Oral BID   Continuous Infusions: . sodium chloride 10 mL/hr at 06/30/19 0200  . sodium chloride    . amiodarone 30 mg/hr (06/30/19 0701)   PRN Meds: sodium chloride, acetaminophen, ALPRAZolam, morphine injection, nitroGLYCERIN, ondansetron (ZOFRAN) IV, sodium chloride flush, zolpidem   Vital Signs    Vitals:   06/30/19 0000 06/30/19 0215 06/30/19 0333 06/30/19 0600  BP: 104/61 98/62    Pulse: 64 65    Resp:      Temp:   99 F (37.2 C)   TempSrc:   Oral   SpO2: 96% 96%    Weight:    94.3 kg  Height:        Intake/Output Summary (Last 24 hours) at 06/30/2019 0759 Last data filed at 06/30/2019 0200 Gross per 24 hour  Intake 1226.35 ml  Output -  Net 1226.35 ml    I/O since admission: +1071  Filed Weights   06/29/19 0535 06/29/19 0825 06/30/19 0600  Weight: 94.3 kg 94.3 kg 94.3 kg    Telemetry    Sinus in the 70s - Personally Reviewed  ECG    06/30/2019 ECG (independently read by me): NSR 62; QS V1-2; T wave inversion V2-4; QTc 491 msec  06/29/2019 ECG (independently read by me): NSR 75, isolated PVC  Physical Exam   BP 98/62   Pulse 65   Temp 99 F (37.2 C) (Oral)   Resp 20   Ht 6' (1.829 m)   Wt 94.3 kg   SpO2 96%   BMI 28.20 kg/m  General: Alert, oriented, no distress.  Skin: normal turgor, no rashes, warm and dry HEENT: Normocephalic, atraumatic. Pupils equal round and reactive to light; sclera anicteric; extraocular muscles intact;  Nose without nasal septal hypertrophy Mouth/Parynx benign;  Neck: No JVD, no carotid bruits;  normal carotid upstroke Lungs: clear to ausculatation and percussion; no wheezing or rales Chest wall: without tenderness to palpitation Heart: PMI not displaced, RRR, s1 s2 normal, 1/6 systolic murmur, no diastolic murmur, no rubs, gallops, thrills, or heaves Abdomen: soft, nontender; no hepatosplenomehaly, BS+; abdominal aorta nontender and not dilated by palpation. Back: no CVA tenderness Pulses 2+; R groin and radial cath site stable Musculoskeletal: full range of motion, normal strength, no joint deformities Extremities: no clubbing cyanosis or edema, Homan's sign negative  Neurologic: grossly nonfocal; Cranial nerves grossly wnl Psychologic: Normal mood and affect   Labs    Chemistry Recent Labs  Lab 06/28/19 2057 06/29/19 0002  NA 141 139  K 4.0 3.7  CL 106 102  CO2 21* 20*  GLUCOSE 130* 158*  BUN 16 17  CREATININE 1.48* 1.39*  CALCIUM 9.2 8.8*  PROT 7.0  --   ALBUMIN 4.1  --   AST 32  --   ALT 32  --   ALKPHOS 84  --   BILITOT 1.0  --   GFRNONAA 47* 51*  GFRAA 55* 59*  ANIONGAP 14 17*     Hematology Recent Labs  Lab 06/28/19 2057 06/29/19 0002  WBC 13.1* 18.8*  RBC 5.12 5.15  HGB 15.5 15.9  HCT 46.9 46.6  MCV 91.6 90.5  MCH 30.3 30.9  MCHC 33.0 34.1  RDW 13.2 13.2  PLT 205 233   HS Tropinin 12,303 >> >27,000  Cardiac EnzymesNo results for input(s): TROPONINI in the last 168 hours. No results for input(s): TROPIPOC in the last 168 hours.   BNPNo results for input(s): BNP, PROBNP in the last 168 hours.   DDimer No results for input(s): DDIMER in the last 168 hours.   Lipid Panel     Component Value Date/Time   CHOL 143 06/29/2019 0002   TRIG 28 06/29/2019 0002   HDL 44 06/29/2019 0002   CHOLHDL 3.3 06/29/2019 0002   VLDL 6 06/29/2019 0002   LDLCALC 93 06/29/2019 0002   LDLDIRECT 146.0 10/15/2016 1517     Radiology    Dg Chest Portable 1 View  Result Date: 06/28/2019 CLINICAL DATA:  STEMI EXAM: PORTABLE CHEST 1 VIEW COMPARISON:   02/11/2017, CT chest 05/23/2015 FINDINGS: Low lung volumes. Chronic elevation of right diaphragm. Possible small pleural effusions. Stable cardiomediastinal silhouette. No pneumothorax. IMPRESSION: Low lung volumes with possible small pleural effusions. Electronically Signed   By: Donavan Foil M.D.   On: 06/28/2019 21:04    Cardiac Studies   Cath  The left ventricular systolic function is normal.  LV end diastolic pressure is normal.  The left ventricular ejection fraction is 50-55% by visual estimate.  Prox RCA lesion is 70% stenosed.  Prox Cx lesion is 40% stenosed.  Ost LAD to Prox LAD lesion is 100% stenosed.  A stent was successfully placed.  Post intervention, there is a 0% residual stenosis.  1st Diag lesion is 99% stenosed.   Intervention    ECHO 06/29/2019 IMPRESSIONS  1. The left ventricle has mildly reduced systolic function, with an ejection fraction of 45-50%. The cavity size was normal. Left ventricular diastolic Doppler parameters are consistent with impaired relaxation. Indeterminate filling pressures The E/e'  is 8-15.  2. Severe hypokinesis of the left ventricular, mid-apical anterior wall and apical segment.  3. The mitral valve is grossly normal.  4. The tricuspid valve is grossly normal.  5. The aortic valve is tricuspid. No stenosis of the aortic valve.  6. The aorta is normal in size and structure.   Patient Profile     68 y.o. male who had suffered an anterior STEMI in 01/2017 RX with DES stent to LAD ostium who presented with anterior STEMI 06/28/2019.  Assessment & Plan    1. Day 2 s/p Anterior STEMI;  S/p 3 tandem DES stents inserted from LAD ostium. Now on ASA/brilinta; ECG today stable; echo to be done. Troponin HS 12,303 -->  >27,000   carvedilol started yesterday; titrate as BP allows. Ultimately add low dose ACE-I/ARB as BP allows. Echo EF 45 - 50% with severe hypokinesis of the left ventricular, mid-apical anterior wall and apical  segment.  2. Transient VT during procedure:  on amiodarone drip:  No recurrence. Will dc amiodarone today  3. Hypotension with reperfusion: transient levophed; tolerating initiation of very lose dose coreg, but BP soft ~ 100; P 60s; will not titrate today.   4. Hyperlipidemia: LDL 93:  atorvastatin increased to 80 mg; if unable to get < 70 then add zetia as outpatient.  5. Mild renal insufficiency: Cr 1.48 > 1.39  Will transfer to cardiac telemetry; Cardiac Rehab.  Signed, Troy Sine, MD, [facility] 06/30/2019, 7:59 AM

## 2019-07-01 ENCOUNTER — Telehealth: Payer: Self-pay | Admitting: Cardiovascular Disease

## 2019-07-01 LAB — BASIC METABOLIC PANEL
Anion gap: 10 (ref 5–15)
BUN: 14 mg/dL (ref 8–23)
CO2: 23 mmol/L (ref 22–32)
Calcium: 8.3 mg/dL — ABNORMAL LOW (ref 8.9–10.3)
Chloride: 108 mmol/L (ref 98–111)
Creatinine, Ser: 1.14 mg/dL (ref 0.61–1.24)
GFR calc Af Amer: >60 mL/min (ref >60)
GFR calc non Af Amer: >60 mL/min (ref >60)
Glucose, Bld: 108 mg/dL — ABNORMAL HIGH (ref 70–99)
Potassium: 3.9 mmol/L (ref 3.5–5.1)
Sodium: 141 mmol/L (ref 135–145)

## 2019-07-01 LAB — CBC
HCT: 41.1 % (ref 39.0–52.0)
Hemoglobin: 13.8 g/dL (ref 13.0–17.0)
MCH: 30.7 pg (ref 26.0–34.0)
MCHC: 33.6 g/dL (ref 30.0–36.0)
MCV: 91.3 fL (ref 80.0–100.0)
Platelets: 172 10*3/uL (ref 150–400)
RBC: 4.5 MIL/uL (ref 4.22–5.81)
RDW: 13.5 % (ref 11.5–15.5)
WBC: 12.6 10*3/uL — ABNORMAL HIGH (ref 4.0–10.5)
nRBC: 0 % (ref 0.0–0.2)

## 2019-07-01 MED ORDER — ATORVASTATIN CALCIUM 80 MG PO TABS: 80.0000 mg | ORAL_TABLET | Freq: Every day | ORAL | 6 refills | Status: DC

## 2019-07-01 MED ORDER — CARVEDILOL 3.125 MG PO TABS: 3.1250 mg | ORAL_TABLET | Freq: Two times a day (BID) | ORAL | 6 refills | Status: DC

## 2019-07-01 MED ORDER — TICAGRELOR 90 MG PO TABS: 90.0000 mg | ORAL_TABLET | Freq: Two times a day (BID) | ORAL | 11 refills | Status: DC

## 2019-07-01 MED FILL — CARVEDILOL 3.125 MG TABLET: 3.125 | 30 days supply | Qty: 60 | Fill #0

## 2019-07-01 MED FILL — ATORVASTATIN CALCIUM 80 MG: 80 | 30 days supply | Qty: 30 | Fill #0

## 2019-07-01 MED FILL — BRILINTA 90 MG TABLET: 90 | 30 days supply | Qty: 60 | Fill #0

## 2019-07-01 NOTE — Discharge Summary (Signed)
Discharge Summary    Patient ID: John Mccarthy MRN: [id number]; DOB: Sep 07, 1951  Admit date: 06/28/2019 Discharge date: 07/01/2019  Primary Care Provider: Janith Lima, MD  Primary Cardiologist: Quay Burow, MD  Discharge Diagnoses    Principal Problem:   STEMI (ST elevation myocardial infarction) [facility]) Active Problems:   Acute ST elevation myocardial infarction (STEMI) involving left anterior descending (LAD) coronary artery (HCC)  ICM Transient VT  HLD  AKI  Allergies Allergies  Allergen Reactions  . Crestor [Rosuvastatin Calcium] Other (See Comments)    Muscle aches  . Lactose Intolerance (Gi) Diarrhea    Diagnostic Studies/Procedures    Cath 06/28/2019 Coronary/Graft Acute MI Revascularization  LEFT HEART CATH AND CORONARY ANGIOGRAPHY  Conclusion    The left ventricular systolic function is normal.  LV end diastolic pressure is normal.  The left ventricular ejection fraction is 50-55% by visual estimate.  Prox RCA lesion is 70% stenosed.  Prox Cx lesion is 40% stenosed.  Ost LAD to Prox LAD lesion is 100% stenosed.  A stent was successfully placed.  Post intervention, there is a 0% residual stenosis.  1st Diag lesion is 99% stenosed.   IMPRESSION: Successful PCI drug-eluting stenting of an occluded proximal LAD in the setting of an anterior STEMI with a door to balloon time of 46 minutes delayed because of requiring alternative access with transient hypotension and ventricular tachyarrhythmias appropriately treated with pressors and antiarrhythmic drugs.  He received Brilinta 180 mg p.o. and will need to be on dual antiplatelet therapy uninterrupted for 12 months.  The radial sheath was removed and a TR band was placed.  The femoral sheath was sewn securely in place.    Intervention    ECHO 06/29/2019 IMPRESSIONS 1. The left ventricle has mildly reduced systolic function, with an ejection fraction of 45-50%. The cavity size was  normal. Left ventricular diastolic Doppler parameters are consistent with impaired relaxation. Indeterminate filling pressures The E/e'  is 8-15. 2. Severe hypokinesis of the left ventricular, mid-apical anterior wall and apical segment. 3. The mitral valve is grossly normal. 4. The tricuspid valve is grossly normal. 5. The aortic valve is tricuspid. No stenosis of the aortic valve. 6. The aorta is normal in size and structure.   History of Present Illness     John Mccarthy is a 68 y.o. male with a history of coronary artery disease (with STEMI in March 2018 and PCI of the ostial LAD with DES Synergy 3.0 x 66mm), hyperlipidemia, BPH and melanoma presented anterior STEMI.   He did well after his MI in 2018. His plavix was discontinued recently. Patient had severe chest pain 10/10 after heavy exertional activity with profound sweating, dyspnea and nausea. EMS called found to have anterior STEMI with EKG ECG revealed ST elevations in V1 and V2 with peaked T waves in V2, I and aVL with ST depressions in III and aVF. He was started on IV heparin and taken to cath lab.   Hospital Course     Consultants: None  1. Anterior STEMI - s/p tendem DES x 3 from ostial LAD. Hs-trop peaked above 27000. Echo showed LVEF of 45-50% with severe hypokinesis of the left ventricular, mid-apical anterior wall and apical segment. Continue low dose BB. DAPT with ASA and Brillinta. Ambulated well. CRP II as outpatient.   2. ICM - Echo as above. Euvolemic. Continue BB. Unable to add ACE/ARB due to soft BP, consider as outpatient.   3. Transient VT during procedure - No reoccurrence.  Now off amiodarone.   4. HLD - 06/29/2019: Cholesterol 143; HDL 44; LDL Cholesterol 93; Triglycerides 28; VLDL 6  - Increased Lipitor to 80mg  qd. Repeat labs in 6 weeks - Needs tight control at LDL less than 70  5. AKi - Resolved.    The patient been seen by Dr. Claiborne Billings today and deemed ready for discharge home. All  follow-up appointments have been scheduled. Discharge medications are listed below.   Discharge Vitals Blood pressure (!) 86/58, pulse 63, temperature 98.8 F (37.1 C), temperature source Oral, resp. rate 19, height 6' (1.829 m), weight 93 kg, SpO2 97 %.  Filed Weights   06/30/19 0600 06/30/19 1323 07/01/19 0438  Weight: 94.3 kg 93.5 kg 93 kg    Labs & Radiologic Studies    CBC Recent Labs    06/28/19 2057 06/29/19 0002 07/01/19 0505  WBC 13.1* 18.8* 12.6*  NEUTROABS 9.8*  --   --   HGB 15.5 15.9 13.8  HCT 46.9 46.6 41.1  MCV 91.6 90.5 91.3  PLT 205 233 258   Basic Metabolic Panel Recent Labs    06/30/19 0653 07/01/19 0505  NA 141 141  K 3.8 3.9  CL 104 108  CO2 26 23  GLUCOSE 114* 108*  BUN 12 14  CREATININE 1.14 1.14  CALCIUM 8.5* 8.3*  MG 2.2  --    Liver Function Tests Recent Labs    06/28/19 2057  AST 32  ALT 32  ALKPHOS 84  BILITOT 1.0  PROT 7.0  ALBUMIN 4.1   Fasting Lipid Panel Recent Labs    06/29/19 0002  CHOL 143  HDL 44  LDLCALC 93  TRIG 28  CHOLHDL 3.3   Thyroid Function Tests No results for input(s): TSH, T4TOTAL, T3FREE, THYROIDAB in the last 72 hours.  Invalid input(s): FREET3 _____________  Dg Chest Portable 1 View  Result Date: 06/28/2019 CLINICAL DATA:  STEMI EXAM: PORTABLE CHEST 1 VIEW COMPARISON:  02/11/2017, CT chest 05/23/2015 FINDINGS: Low lung volumes. Chronic elevation of right diaphragm. Possible small pleural effusions. Stable cardiomediastinal silhouette. No pneumothorax. IMPRESSION: Low lung volumes with possible small pleural effusions. Electronically Signed   By: Donavan Foil M.D.   On: 06/28/2019 21:04   Disposition   Pt is being discharged home today in good condition.  Follow-up Plans & Appointments    Follow-up Information    Janith Lima, MD. Go on 07/06/2019.   Specialty: Internal Medicine Why: @2 :00pm Contact information: [address] [address] [place], [place]. Go on 07/08/2019.   Specialties: Cardiology, Radiology Why: @8 :15 am hospital follow up Contact information: [address] [place] [address] [phone]          Discharge Instructions    Amb Referral to Cardiac Rehabilitation   Complete by: As directed    Diagnosis:  STEMI Coronary Stents     After initial evaluation and assessments completed: Virtual Based Care may be provided alone or in conjunction with Phase 2 Cardiac Rehab based on patient barriers.: Yes   Diet - low sodium heart healthy   Complete by: As directed    Discharge instructions   Complete by: As directed    No driving for 2 weeks. No lifting over 10 lbs for 4 weeks. No sexual activity for 4 weeks. You may not return to work until cleared by your cardiologist. Keep procedure site clean & dry. If you notice increased  pain, swelling, bleeding or pus, call/return!  You may shower, but no soaking baths/hot tubs/pools for 1 week.   Increase activity slowly   Complete by: As directed       Discharge Medications   Allergies as of 07/01/2019      Reactions   Crestor [rosuvastatin Calcium] Other (See Comments)   Muscle aches   Lactose Intolerance (gi) Diarrhea      Medication List    STOP taking these medications   clopidogrel 75 MG tablet Commonly known as: PLAVIX     TAKE these medications   aspirin EC 81 MG tablet Take 1 tablet (81 mg total) by mouth daily.   atorvastatin 80 MG tablet Commonly known as: LIPITOR Take 1 tablet (80 mg total) by mouth daily at 6 PM. What changed:   medication strength  See the new instructions.   carvedilol 3.125 MG tablet Commonly known as: COREG Take 1 tablet (3.125 mg total) by mouth 2 (two) times daily with a meal.   CENTRUM SILVER 50+MEN PO Take 1 tablet by mouth daily.   nitroGLYCERIN 0.4 MG SL tablet Commonly known as: NITROSTAT Place 1 tablet (0.4 mg total) under the tongue every 5 (five) minutes as needed for  chest pain.   Testosterone 12.5 MG/ACT (1%) Gel Place 1 Act onto the skin daily.   ticagrelor 90 MG Tabs tablet Commonly known as: BRILINTA Take 1 tablet (90 mg total) by mouth 2 (two) times daily.        Acute coronary syndrome (MI, NSTEMI, STEMI, etc) this admission?: Yes.     AHA/ACC Clinical Performance & Quality Measures: 1. Aspirin prescribed? - Yes 2. ADP Receptor Inhibitor (Plavix/Clopidogrel, Brilinta/Ticagrelor or Effient/Prasugrel) prescribed (includes medically managed patients)? - Yes 3. Beta Blocker prescribed? - Yes 4. High Intensity Statin (Lipitor 40-80mg  or Crestor 20-40mg ) prescribed? - Yes 5. EF assessed during THIS hospitalization? - Yes 6. For EF <40%, was ACEI/ARB prescribed? - Not Applicable (EF >/= 30%) 7. For EF <40%, Aldosterone Antagonist (Spironolactone or Eplerenone) prescribed? - Not Applicable (EF >/= 94%) 8. Cardiac Rehab Phase II ordered (Included Medically managed Patients)? - Yes     Outstanding Labs/Studies   Lipid panel and LFTs in 6 weeks  Duration of Discharge Encounter   Greater than 30 minutes including physician time.  Jarrett Soho, PA 07/01/2019, 12:26 PM

## 2019-07-01 NOTE — Telephone Encounter (Signed)
New Message  Per PA Bhagat, scheduled TOC visit with PA Kerin Ransom on 07/08/2019 at 8:15 am.

## 2019-07-01 NOTE — Progress Notes (Signed)
Patient alert and oriented, denies pain, v/s stable, iv and tele removed. Discharge instruction explain and given to the patient, all questions answered. Will d/c patient home per order

## 2019-07-01 NOTE — Progress Notes (Addendum)
Progress Note  Patient Name: John Mccarthy Date of Encounter: 07/01/2019  Primary Cardiologist: Dr. Gwenlyn Found  Subjective   Feeling well. No chest pain, sob or palpitations.   Inpatient Medications    Scheduled Meds: . aspirin  81 mg Oral Daily  . atorvastatin  80 mg Oral q1800  . carvedilol  3.125 mg Oral BID WC  . Chlorhexidine Gluconate Cloth  6 each Topical Daily  . sodium chloride flush  3 mL Intravenous Q12H  . ticagrelor  90 mg Oral BID   Continuous Infusions: . sodium chloride 10 mL/hr at 06/30/19 0800  . sodium chloride     PRN Meds: sodium chloride, acetaminophen, ALPRAZolam, alum & mag hydroxide-simeth, bisacodyl, morphine injection, nitroGLYCERIN, ondansetron (ZOFRAN) IV, sodium chloride flush, zolpidem   Vital Signs    Vitals:   06/30/19 2349 07/01/19 0438 07/01/19 0737 07/01/19 0817  BP: (!) 102/59 94/60 (!) 97/51 100/60  Pulse: 72 71 74 67  Resp: 18 18 18    Temp: 98.6 F (37 C) 98.4 F (36.9 C) 99.3 F (37.4 C)   TempSrc: Oral Oral Oral   SpO2: 99% 96% 98%   Weight:  93 kg    Height:        Intake/Output Summary (Last 24 hours) at 07/01/2019 0929 Last data filed at 07/01/2019 0800 Gross per 24 hour  Intake 1060 ml  Output 1750 ml  Net -690 ml   Last 3 Weights 07/01/2019 06/30/2019 06/30/2019  Weight (lbs) 205 lb 206 lb 3.2 oz 207 lb 14.3 oz  Weight (kg) 92.987 kg 93.532 kg 94.3 kg      Telemetry    NSR- Personally Reviewed  ECG    SR with TWI anterior leads   Physical Exam   GEN: No acute distress.   Neck: No JVD Cardiac: RRR, soft systolic murmurs, rubs, or gallops.  Respiratory: Clear to auscultation bilaterally. GI: Soft, nontender, non-distended  MS: No edema; No deformity. Neuro:  Nonfocal  Psych: Normal affect   Labs    High Sensitivity Troponin:   Recent Labs  Lab 06/28/19 2057 06/29/19 0002 06/29/19 0811  TROPONINIHS 12 12,303* >27,000*      Chemistry Recent Labs  Lab 06/28/19 2057 06/29/19 0002 06/30/19  0653 07/01/19 0505  NA 141 139 141 141  K 4.0 3.7 3.8 3.9  CL 106 102 104 108  CO2 21* 20* 26 23  GLUCOSE 130* 158* 114* 108*  BUN 16 17 12 14   CREATININE 1.48* 1.39* 1.14 1.14  CALCIUM 9.2 8.8* 8.5* 8.3*  PROT 7.0  --   --   --   ALBUMIN 4.1  --   --   --   AST 32  --   --   --   ALT 32  --   --   --   ALKPHOS 84  --   --   --   BILITOT 1.0  --   --   --   GFRNONAA 47* 51* >60 >60  GFRAA 55* 59* >60 >60  ANIONGAP 14 17* 11 10     Hematology Recent Labs  Lab 06/28/19 2057 06/29/19 0002 07/01/19 0505  WBC 13.1* 18.8* 12.6*  RBC 5.12 5.15 4.50  HGB 15.5 15.9 13.8  HCT 46.9 46.6 41.1  MCV 91.6 90.5 91.3  MCH 30.3 30.9 30.7  MCHC 33.0 34.1 33.6  RDW 13.2 13.2 13.5  PLT 205 233 172     Radiology    No results found.  Cardiac Studies  Cath  The left ventricular systolic function is normal.  LV end diastolic pressure is normal.  The left ventricular ejection fraction is 50-55% by visual estimate.  Prox RCA lesion is 70% stenosed.  Prox Cx lesion is 40% stenosed.  Ost LAD to Prox LAD lesion is 100% stenosed.  A stent was successfully placed.  Post intervention, there is a 0% residual stenosis.  1st Diag lesion is 99% stenosed.   Intervention    ECHO 06/29/2019 IMPRESSIONS 1. The left ventricle has mildly reduced systolic function, with an ejection fraction of 45-50%. The cavity size was normal. Left ventricular diastolic Doppler parameters are consistent with impaired relaxation. Indeterminate filling pressures The E/e'  is 8-15. 2. Severe hypokinesis of the left ventricular, mid-apical anterior wall and apical segment. 3. The mitral valve is grossly normal. 4. The tricuspid valve is grossly normal. 5. The aortic valve is tricuspid. No stenosis of the aortic valve. 6. The aorta is normal in size and structure.  ECHO 06/29/2019 IMPRESSIONS 1. The left ventricle has mildly reduced systolic function, with an ejection fraction of 45-50%.  The cavity size was normal. Left ventricular diastolic Doppler parameters are consistent with impaired relaxation. Indeterminate filling pressures The E/e'  is 8-15. 2. Severe hypokinesis of the left ventricular, mid-apical anterior wall and apical segment. 3. The mitral valve is grossly normal. 4. The tricuspid valve is grossly normal. 5. The aortic valve is tricuspid. No stenosis of the aortic valve. 6. The aorta is normal in size and structure.   Patient Profile      68 y.o. male who had suffered an anterior STEMI in 01/2017 RX with DES stent to LAD ostium who presented with anterior STEMI 06/28/2019.  Assessment & Plan    1. Anterior STEMI - s/p tendem DES x 3 from ostial LAD. Hs-trop peaked above 27000. Echo showed LVEF of 45-50% with severe hypokinesis of the left ventricular, mid-apical anterior wall and apical segment. Continue low dose BB.   2. ICM - Echo as above. Euvolemic. Continue BB. Unable to add ACE/ARB due to soft BP, consider as outpatient.   3. Transient VT during procedure - No reoccurrence. Now off amiodarone.   4. HLD - 06/29/2019: Cholesterol 143; HDL 44; LDL Cholesterol 93; Triglycerides 28; VLDL 6  - Increased Lipitor to 80mg  qd. Repeat labs in 6 weeks - Needs tight control at LDL less than 70  5. AKi - Resolved.   Ambulating well. Discharge later today.   For questions or updates, please contact [place] Please consult [email or web address] for contact info under        SignedLeanor Kail, PA  07/01/2019, 9:29 AM      Patient seen and examined. Agree with assessment and plan. Day 3 s/p Ant MI Rx with 3 DES stents in LAD. Feels well; no chest pain with walking. Titrate coreg as outpatient as BP and HR allow. OK for dc today; F/U with Dr. Emilio Math, MD, [facility] 07/01/2019 10:13 AM

## 2019-07-01 NOTE — TOC Transition Note (Signed)
Transition of Care [facility]) - CM/SW Discharge Note   Patient Details  Name: John Mccarthy MRN: [id number] Date of Birth: 08-29-1951  [facility] CM/SW Contact:  Zenon Mayo, RN Phone Number: 07/01/2019, 12:36 PM   Clinical Narrative:    Patient for discharge today, s/p cath with stent intervention will be on brilinta, the [facility] pharmacy is filling the 30 day free for patient and they will run a test claim to see how much the brilinta will cost for refills and they will give this information to the patient.    Final next level of care: Home/Self Care Barriers to Discharge: No Barriers Identified   Patient Goals and CMS Choice Patient states their goals for this hospitalization and ongoing recovery are:: get better   Choice offered to / list presented to : NA  Discharge Placement                       Discharge Plan and Services                DME Arranged: (NA)         HH Arranged: NA          Social Determinants of Health (SDOH) Interventions     Readmission Risk Interventions No flowsheet data found.

## 2019-07-01 NOTE — Care Management Important Message (Signed)
Important Message  Patient Details  Name: John Mccarthy MRN: [id number] Date of Birth: 04-08-1951   Medicare Important Message Given:  Yes     Shelda Altes 07/01/2019, 12:30 PM

## 2019-07-02 ENCOUNTER — Telehealth (HOSPITAL_COMMUNITY): Payer: Self-pay

## 2019-07-02 ENCOUNTER — Telehealth: Payer: Self-pay | Admitting: *Deleted

## 2019-07-02 NOTE — Telephone Encounter (Signed)
LMTCB on name-verified VM with appointment reminder for 07/08/19

## 2019-07-02 NOTE — Telephone Encounter (Signed)
Pt insurance is active and benefits verified through Medicare a/b Co-pay 0, DED $198/$198 met, out of pocket 0/0 met, co-insurance 20%. no pre-authorization required. Passport, 07/02/2019 @ 4:18pm, REF# [id number]  Will contact patient to see if he is interested in the Cardiac Rehab Program. If interested, patient will need to complete follow up appt. Once completed, patient will be contacted for scheduling upon review by the RN Navigator.

## 2019-07-02 NOTE — Telephone Encounter (Signed)
Transition Care Management Follow-up Telephone Call   Date discharged? 07/01/19   How have you been since you were released from the hospital? Pt states he is doing ok   Do you understand why you were in the hospital? YES   Do you understand the discharge instructions? YES   Where were you discharged to? HOME   Items Reviewed:  Medications reviewed: YES, pt no longer taking plavix which has been updated on med list  Allergies reviewed: YES  Dietary changes reviewed: YES, heart healthy  Referrals reviewed: YES, will see cardiologist on 07/08/19   Functional Questionnaire:   Activities of Daily Living (ADLs):   He states he are independent in the following: ambulation, bathing and hygiene, feeding, continence, grooming, toileting and dressing States he doesn't require assistance with the following: ambulation, bathing and hygiene, feeding, continence, grooming, toileting and dressing   Any transportation issues/concerns?: NO   Any patient concerns? NO   Confirmed importance and date/time of follow-up visits scheduled YES, appt 07/06/19  Provider Appointment booked with Dr. Ronnald Ramp  Confirmed with patient if condition begins to worsen call PCP or go to the ER.  Patient was given the office number and encouraged to call back with question or concerns.  : YES

## 2019-07-02 NOTE — Telephone Encounter (Signed)
Follow Up   Patient is returning phone call. Patient is also asking for the Cath videos from his procedure.  Please give patient a call back.

## 2019-07-02 NOTE — Telephone Encounter (Signed)
Patient contacted regarding discharge from [facility] on 07/01/19.  Patient understands to follow up with provider [facility] on 8/6 at 8:15am at [facility]. Patient understands discharge instructions? YES Patient understands medications and regiment? YES Patient understands to bring all medications to this visit? YES  Provided patient phone # and email to request records from hospital

## 2019-07-06 ENCOUNTER — Ambulatory Visit (INDEPENDENT_AMBULATORY_CARE_PROVIDER_SITE_OTHER): Payer: Medicare Other | Admitting: Internal Medicine

## 2019-07-06 ENCOUNTER — Encounter: Payer: Self-pay | Admitting: Internal Medicine

## 2019-07-06 ENCOUNTER — Other Ambulatory Visit: Payer: Self-pay

## 2019-07-06 ENCOUNTER — Other Ambulatory Visit (INDEPENDENT_AMBULATORY_CARE_PROVIDER_SITE_OTHER): Payer: Medicare Other

## 2019-07-06 VITALS — BP 110/60 | HR 66 | Temp 97.9°F | Resp 16 | Ht 72.0 in | Wt 207.0 lb

## 2019-07-06 DIAGNOSIS — I2510 Atherosclerotic heart disease of native coronary artery without angina pectoris: Secondary | ICD-10-CM

## 2019-07-06 DIAGNOSIS — E785 Hyperlipidemia, unspecified: Secondary | ICD-10-CM | POA: Diagnosis not present

## 2019-07-06 DIAGNOSIS — Z23 Encounter for immunization: Secondary | ICD-10-CM

## 2019-07-06 DIAGNOSIS — Z9861 Coronary angioplasty status: Secondary | ICD-10-CM

## 2019-07-06 LAB — CBC WITH DIFFERENTIAL/PLATELET
Basophils Absolute: 0.1 10*3/uL (ref 0.0–0.1)
Basophils Relative: 1.1 % (ref 0.0–3.0)
Eosinophils Absolute: 0.2 10*3/uL (ref 0.0–0.7)
Eosinophils Relative: 1.6 % (ref 0.0–5.0)
HCT: 44.3 % (ref 39.0–52.0)
Hemoglobin: 15 g/dL (ref 13.0–17.0)
Lymphocytes Relative: 12.2 % (ref 12.0–46.0)
Lymphs Abs: 1.3 10*3/uL (ref 0.7–4.0)
MCHC: 33.9 g/dL (ref 30.0–36.0)
MCV: 90.3 fl (ref 78.0–100.0)
Monocytes Absolute: 1 10*3/uL (ref 0.1–1.0)
Monocytes Relative: 9.8 % (ref 3.0–12.0)
Neutro Abs: 7.9 10*3/uL — ABNORMAL HIGH (ref 1.4–7.7)
Neutrophils Relative %: 75.3 % (ref 43.0–77.0)
Platelets: 219 10*3/uL (ref 150.0–400.0)
RBC: 4.91 Mil/uL (ref 4.22–5.81)
RDW: 13.8 % (ref 11.5–15.5)
WBC: 10.5 10*3/uL (ref 4.0–10.5)

## 2019-07-06 LAB — BASIC METABOLIC PANEL
BUN: 16 mg/dL (ref 6–23)
CO2: 25 mEq/L (ref 19–32)
Calcium: 9.3 mg/dL (ref 8.4–10.5)
Chloride: 104 mEq/L (ref 96–112)
Creatinine, Ser: 0.99 mg/dL (ref 0.40–1.50)
GFR: 74.7 mL/min (ref >60.00)
Glucose, Bld: 99 mg/dL (ref 70–99)
Potassium: 4.1 mEq/L (ref 3.5–5.1)
Sodium: 139 mEq/L (ref 135–145)

## 2019-07-06 NOTE — Patient Instructions (Signed)
Coronary Artery Disease, Male °Coronary artery disease (CAD) is a condition in which the arteries that lead to the heart (coronary arteries) become narrow or blocked. The narrowing or blockage can lead to decreased blood flow to the heart. Prolonged reduced blood flow can cause a heart attack (myocardial infarction or MI). This condition may also be called coronary heart disease. °Because CAD is the leading cause of death in men, it is important to understand what causes this condition and how it is treated. °What are the causes? °CAD is most often caused by atherosclerosis. This is the buildup of fat and cholesterol (plaque) on the inside of the arteries. Over time, the plaque may narrow or block the artery, reducing blood flow to the heart. Plaque can also become weak and break off within a coronary artery and cause a sudden blockage. Other less common causes of CAD include: °· A blood clot or a piece of a blood clot or other substance that blocks the flow of blood in a coronary artery (embolism). °· A tearing of the artery (spontaneous coronary artery dissection). °· An enlargement of an artery (aneurysm). °· Inflammation (vasculitis) in the artery wall. °What increases the risk? °The following factors may make you more likely to develop this condition: °· Age. Men over age 45 are at a greater risk of CAD. °· Family history of CAD. °· Gender. Men often develop CAD earlier in life than women. °· High blood pressure (hypertension). °· Diabetes. °· High cholesterol levels. °· Tobacco use. °· Excessive alcohol use. °· Lack of exercise. °· A diet high in saturated and trans fats, such as fried food and processed meat. °Other possible risk factors include: °· High stress levels. °· Depression. °· Obesity. °· Sleep apnea. °What are the signs or symptoms? °Many people do not have any symptoms during the early stages of CAD. As the condition progresses, symptoms may include: °· Chest pain (angina). The pain can: °? Feel  like crushing or squeezing, or like a tightness, pressure, fullness, or heaviness in the chest. °? Last more than a few minutes or can stop and recur. The pain tends to get worse with exercise or stress and to fade with rest. °· Pain in the arms, neck, jaw, ear, or back. °· Unexplained heartburn or indigestion. °· Shortness of breath. °· Nausea or vomiting. °· Sudden light-headedness. °· Sudden cold sweats. °· Fluttering or fast heartbeat (palpitations). °How is this diagnosed? °This condition is diagnosed based on: °· Your family and medical history. °· A physical exam. °· Tests, including: °? A test to check the electrical signals in your heart (electrocardiogram). °? Exercise stress test. This looks for signs of blockage when the heart is stressed with exercise, such as running on a treadmill. °? Pharmacologic stress test. This test looks for signs of blockage when the heart is being stressed with a medicine. °? Blood tests. °? Coronary angiogram. This is a procedure to look at the coronary arteries to see if there is any blockage. During this test, a dye is injected into your arteries so they appear on an X-ray. °? Coronary artery CT scan. This CT scan helps detect calcium deposits in your coronary arteries. Calcium deposits are an indicator of CAD. °? A test that uses sound waves to take a picture of your heart (echocardiogram). °? Chest X-ray. °How is this treated? °This condition may be treated by: °· Healthy lifestyle changes to reduce risk factors. °· Medicines such as: °? Antiplatelet medicines and blood-thinning medicines, such   as aspirin. These help to prevent blood clots. °? Nitroglycerin. °? Blood pressure medicines. °? Cholesterol-lowering medicine. °· Coronary angioplasty and stenting. During this procedure, a thin, flexible tube is inserted through a blood vessel and into a blocked artery. A balloon or similar device on the end of the tube is inflated to open up the artery. In some cases, a small,  mesh tube (stent) is inserted into the artery to keep it open. °· Coronary artery bypass surgery. During this surgery, veins or arteries from other parts of the body are used to create a bypass around the blockage and allow blood to reach your heart. °Follow these instructions at home: °Medicines °· Take over-the-counter and prescription medicines only as told by your health care provider. °· Do not take the following medicines unless your health care provider approves: °? NSAIDs, such as ibuprofen, naproxen, or celecoxib. °? Vitamin supplements that contain vitamin A, vitamin E, or both. °Lifestyle °· Follow an exercise program approved by your health care provider. Aim for 150 minutes of moderate exercise or 75 minutes of vigorous exercise each week. °· Maintain a healthy weight or lose weight as approved by your health care provider. °· Learn to manage stress or try to limit your stress. Ask your health care provider for suggestions if you need help. °· Get screened for depression and seek treatment, if needed. °· Do not use any products that contain nicotine or tobacco, such as cigarettes, e-cigarettes, and chewing tobacco. If you need help quitting, ask your health care provider. °· Do not use illegal drugs. °Eating and drinking ° °· Follow a heart-healthy diet. A dietitian can help educate you about healthy food options and changes. In general, eat plenty of fruits and vegetables, lean meats, and whole grains. °· Avoid foods high in: °? Sugar. °? Salt (sodium). °? Saturated fat, such as processed or fatty meat. °? Trans fat, such as fried foods. °· Use healthy cooking methods such as roasting, grilling, broiling, baking, poaching, steaming, or stir-frying. °· Do not drink alcohol if your health care provider tells you not to drink. °· If you drink alcohol: °? Limit how much you have to 0-2 drinks per day. °? Be aware of how much alcohol is in your drink. In the [place] one drink equals one 12 oz bottle of beer  (355 mL), one 5 oz glass of wine (148 mL), or one 1½ oz glass of hard liquor (44 mL). °General instructions °· Manage any other health conditions, such as hypertension and diabetes. These conditions affect your heart. °· Your health care provider may ask you to monitor your blood pressure. Ideally, your blood pressure should be below 130/80. °· Keep all follow-up visits as told by your health care provider. This is important. °Get help right away if: °· You have pain in your chest, neck, ear, arm, jaw, stomach, or back that: °? Lasts more than a few minutes. °? Is recurring. °? Is not relieved by taking medicine under your tongue (sublingual nitroglycerin). °· You have profuse sweating without cause. °· You have unexplained: °? Heartburn or indigestion. °? Shortness of breath or difficulty breathing. °? Fluttering or fast heartbeat (palpitations). °? Nausea or vomiting. °? Fatigue. °? Feelings of nervousness or anxiety. °? Weakness. °? Diarrhea. °· You have sudden light-headedness or dizziness. °· You faint. °· You feel like hurting yourself or think about taking your own life. °These symptoms may represent a serious problem that is an emergency. Do not wait to see if the   symptoms will go away. Get medical help right away. Call your local emergency services (911 in the [place] Do not drive yourself to the hospital. °Summary °· Coronary artery disease (CAD) is a condition in which the arteries that lead to the heart (coronary arteries) become narrow or blocked. The narrowing or blockage can lead to a heart attack. °· Many people do not have any symptoms during the early stages of CAD. °· CAD can be treated with lifestyle changes, medicines, surgery, or a combination of these treatments. °This information is not intended to replace advice given to you by your health care provider. Make sure you discuss any questions you have with your health care provider. °Document Released: 06/15/2014 Document Revised: 08/07/2018  Document Reviewed: 07/28/2018 °Elsevier Patient Education © 2020 [employer] ° °

## 2019-07-06 NOTE — Progress Notes (Signed)
Subjective:  Patient ID: John Mccarthy, male    DOB: 01/11/1951  Age: 68 y.o. MRN: [id number]  CC: Coronary Artery Disease   HPI BRYEN HINDERMAN presents for hosp f/up - He was recently admitted for a STEMI.  He underwent further stenting.  He has had no more episodes of chest pain or shortness of breath.  He is walking several miles a day and denies DOE.  He is concerned that during the admission his calcium was low and his white cell count was elevated.  He tells me he is compliant with the antiplatelet agents and the statin.  He tells me Kary Kos has caused a few episodes of nausea but he denies abdominal pain, vomiting, melena, or bright red blood per rectum.  Outpatient Medications Prior to Visit  Medication Sig Dispense Refill  . aspirin EC 81 MG tablet Take 1 tablet (81 mg total) by mouth daily. 90 tablet 3  . atorvastatin (LIPITOR) 80 MG tablet Take 1 tablet (80 mg total) by mouth daily at 6 PM. 30 tablet 6  . carvedilol (COREG) 3.125 MG tablet Take 1 tablet (3.125 mg total) by mouth 2 (two) times daily with a meal. 60 tablet 6  . Multiple Vitamins-Minerals (CENTRUM SILVER 50+MEN PO) Take 1 tablet by mouth daily.    . nitroGLYCERIN (NITROSTAT) 0.4 MG SL tablet Place 1 tablet (0.4 mg total) under the tongue every 5 (five) minutes as needed for chest pain. 25 tablet 3  . Testosterone 12.5 MG/ACT (1%) GEL Place 1 Act onto the skin daily. 225 g 1  . ticagrelor (BRILINTA) 90 MG TABS tablet Take 1 tablet (90 mg total) by mouth 2 (two) times daily. 60 tablet 11   No facility-administered medications prior to visit.     ROS Review of Systems  Constitutional: Negative.  Negative for diaphoresis and fatigue.  HENT: Negative.  Negative for trouble swallowing.   Eyes: Negative for visual disturbance.  Respiratory: Negative for cough, chest tightness and wheezing.   Gastrointestinal: Positive for nausea. Negative for abdominal pain, anal bleeding, blood in stool, constipation,  diarrhea and vomiting.  Genitourinary: Negative.  Negative for difficulty urinating, dysuria and hematuria.  Musculoskeletal: Negative for arthralgias and myalgias.  Skin: Negative.  Negative for color change and rash.  Neurological: Negative.  Negative for dizziness, weakness and light-headedness.  Hematological: Negative for adenopathy. Does not bruise/bleed easily.  Psychiatric/Behavioral: Negative.     Objective:  BP 110/60 (BP Location: Left Arm, Patient Position: Sitting, Cuff Size: Normal)   Pulse 66   Temp 97.9 F (36.6 C) (Oral)   Resp 16   Ht 6' (1.829 m)   Wt 207 lb (93.9 kg)   SpO2 96%   BMI 28.07 kg/m   BP Readings from Last 3 Encounters:  07/06/19 110/60  07/01/19 (!) 86/58  07/09/18 132/70    Wt Readings from Last 3 Encounters:  07/06/19 207 lb (93.9 kg)  07/01/19 205 lb (93 kg)  03/03/19 202 lb (91.6 kg)    Physical Exam Vitals signs reviewed.  Constitutional:      General: He is not in acute distress.    Appearance: He is not ill-appearing, toxic-appearing or diaphoretic.  HENT:     Nose: Nose normal.     Mouth/Throat:     Mouth: Mucous membranes are moist.  Eyes:     General: No scleral icterus.    Conjunctiva/sclera: Conjunctivae normal.  Neck:     Musculoskeletal: Normal range of motion. No neck rigidity or  muscular tenderness.  Cardiovascular:     Rate and Rhythm: Normal rate and regular rhythm.     Heart sounds: No murmur.  Pulmonary:     Effort: Pulmonary effort is normal. No respiratory distress.     Breath sounds: No stridor. No wheezing, rhonchi or rales.  Abdominal:     General: Abdomen is protuberant. Bowel sounds are normal.     Palpations: There is no hepatomegaly or splenomegaly.     Tenderness: There is no abdominal tenderness.  Musculoskeletal: Normal range of motion.     Right lower leg: No edema.     Left lower leg: No edema.  Lymphadenopathy:     Cervical: No cervical adenopathy.  Skin:    General: Skin is warm and  dry.     Coloration: Skin is not pale.     Findings: No erythema.  Neurological:     General: No focal deficit present.     Mental Status: He is oriented to person, place, and time. Mental status is at baseline.  Psychiatric:        Mood and Affect: Mood normal.        Behavior: Behavior normal.        Thought Content: Thought content normal.     Lab Results  Component Value Date   WBC 10.5 07/06/2019   HGB 15.0 07/06/2019   HCT 44.3 07/06/2019   PLT 219.0 07/06/2019   GLUCOSE 99 07/06/2019   CHOL 143 06/29/2019   TRIG 28 06/29/2019   HDL 44 06/29/2019   LDLDIRECT 146.0 10/15/2016   LDLCALC 93 06/29/2019   ALT 32 06/28/2019   AST 32 06/28/2019   NA 139 07/06/2019   K 4.1 07/06/2019   CL 104 07/06/2019   CREATININE 0.99 07/06/2019   BUN 16 07/06/2019   CO2 25 07/06/2019   TSH 2.93 07/09/2018   PSA 0.73 10/16/2017   INR 1.3 (H) 06/29/2019   HGBA1C 5.5 10/15/2016    Dg Chest Portable 1 View  Result Date: 06/28/2019 CLINICAL DATA:  STEMI EXAM: PORTABLE CHEST 1 VIEW COMPARISON:  02/11/2017, CT chest 05/23/2015 FINDINGS: Low lung volumes. Chronic elevation of right diaphragm. Possible small pleural effusions. Stable cardiomediastinal silhouette. No pneumothorax. IMPRESSION: Low lung volumes with possible small pleural effusions. Electronically Signed   By: Donavan Foil M.D.   On: 06/28/2019 21:04    Assessment & Plan:   Arless was seen today for coronary artery disease.  Diagnoses and all orders for this visit:  CAD S/P percutaneous coronary angioplasty- His white cell count and calcium level are normal now.  He has had no more episodes of angina.  Will continue to work on risk factor modifications. -     CBC with Differential/Platelet; Future -     Basic metabolic panel; Future  Hyperlipidemia with target LDL less than 130-He is doing well on the statin.  Will continue the high dose/high intensity statin unless he develops side effects.  Need for Tdap vaccination  -     Tdap vaccine greater than or equal to 7yo IM   I am having John Mccarthy maintain his aspirin EC, nitroGLYCERIN, Testosterone, Multiple Vitamins-Minerals (CENTRUM SILVER 50+MEN PO), atorvastatin, carvedilol, and ticagrelor.  No orders of the defined types were placed in this encounter.    Follow-up: Return in about 3 months (around 10/06/2019).  Scarlette Calico, MD

## 2019-07-08 ENCOUNTER — Other Ambulatory Visit: Payer: Self-pay

## 2019-07-08 ENCOUNTER — Encounter: Payer: Self-pay | Admitting: General Practice

## 2019-07-08 ENCOUNTER — Ambulatory Visit (INDEPENDENT_AMBULATORY_CARE_PROVIDER_SITE_OTHER): Payer: Medicare Other | Admitting: General Practice

## 2019-07-08 VITALS — BP 115/75 | HR 71 | Ht 72.0 in | Wt 206.0 lb

## 2019-07-08 DIAGNOSIS — I2102 ST elevation (STEMI) myocardial infarction involving left anterior descending coronary artery: Secondary | ICD-10-CM

## 2019-07-08 DIAGNOSIS — E785 Hyperlipidemia, unspecified: Secondary | ICD-10-CM

## 2019-07-08 DIAGNOSIS — N179 Acute kidney failure, unspecified: Secondary | ICD-10-CM | POA: Diagnosis not present

## 2019-07-08 MED ORDER — NITROGLYCERIN 0.4 MG SL SUBL: 0.4000 mg | SUBLINGUAL_TABLET | SUBLINGUAL | 3 refills | Status: DC | PRN

## 2019-07-08 NOTE — Progress Notes (Signed)
Cardiology Clinic Note   Patient Name: John Mccarthy Date of Encounter: 07/08/2019  Primary Care Provider:  Janith Lima, MD Primary Cardiologist:  Quay Burow, MD  Patient Profile    John Mccarthy 68-year-old male presents in the clinic today for follow-up status post STEMI and PCI to LAD on 06/28/2019.  Past Medical History    Past Medical History:  Diagnosis Date   BPH (benign prostatic hyperplasia)    Hyperlipidemia    Melanoma ([place])    Multiple myeloma ([place])    Pulmonary embolism ([place]) 06/15/2013   Skin cancer (melanoma) ([place])    Past Surgical History:  Procedure Laterality Date   ACHILLES TENDON SURGERY Right 05/18/2013   CARDIAC CATHETERIZATION     CORONARY STENT INTERVENTION N/A 02/11/2017   Procedure: Coronary Stent Intervention;  Surgeon: Lorretta Harp, MD;  Location: [place] [facility];  Service: Cardiovascular;  Laterality: N/A;   CORONARY/GRAFT ACUTE MI REVASCULARIZATION N/A 06/28/2019   Procedure: Coronary/Graft Acute MI Revascularization;  Surgeon: Lorretta Harp, MD;  Location: [place] [facility];  Service: Cardiovascular;  Laterality: N/A;   ESOPHAGOGASTRODUODENOSCOPY  09/24/2012   Procedure: ESOPHAGOGASTRODUODENOSCOPY (EGD);  Surgeon: Lear Ng, MD;  Location: [facility] ENDOSCOPY;  Service: Endoscopy;  Laterality: N/A;   HAND SURGERY     LEFT HEART CATH AND CORONARY ANGIOGRAPHY N/A 02/11/2017   Procedure: Left Heart Cath and Coronary Angiography;  Surgeon: Lorretta Harp, MD;  Location: [place] [facility];  Service: Cardiovascular;  Laterality: N/A;   LEFT HEART CATH AND CORONARY ANGIOGRAPHY N/A 06/28/2019   Procedure: LEFT HEART CATH AND CORONARY ANGIOGRAPHY;  Surgeon: Lorretta Harp, MD;  Location: [place] [facility];  Service: Cardiovascular;  Laterality: N/A;    Allergies  Allergies  Allergen Reactions   Crestor [Rosuvastatin Calcium] Other (See Comments)    Muscle aches   Lactose Intolerance (Gi) Diarrhea     History of Present Illness    John Mccarthy was discharged from the hospital on 07/01/2019 status post coronary graft acute MI revascularization with placement of drug-eluting stent to an occluded previously stented proximal LAD lesion(06/28/2019).  His cardiac catheterization also showed 40% proximal CX, and 70% stenosed proximal RCA.  He had a previous cardiac catheterization in 2018 with LAD stent placement.  His estimated LVEF was 50%-55%.  Unable to start ACE/ARB at discharge due to hypotension.  His PMH also includes peptic ulcer disease, hypogonadism, BPH, hyperlipidemia, and very osteoarthritis bilateral knees.    He presents to the clinic today and states he feels well.  He has started to resume his normal daily activities including daily walking for 10 to 12 minutes at a time.  He notes that he has some muscle tension in his neck and upper back that he attributes to anxiety.  He states he tends to carry his stress in his neck and shoulders.  He also states he has a hyperreactive gag reflex and clears his throat fairly often and admits to not drinking as often as he should.  He requests patient assistance form application for Brilinta.  And states he plans to keep his nitroglycerin at hand in case he has another anginal event.  He denies chest pain, shortness of breath, lower extremity edema, fatigue, palpitations, melena, hematuria, hemoptysis, diaphoresis, weakness, presyncope, syncope, orthopnea, and PND.   Home Medications    Prior to Admission medications   Medication Sig Start Date End Date Taking? Authorizing Provider  aspirin EC 81 MG tablet Take 1 tablet (  81 mg total) by mouth daily. 05/31/15   Janith Lima, MD  atorvastatin (LIPITOR) 80 MG tablet Take 1 tablet (80 mg total) by mouth daily at 6 PM. 07/01/19   Bhagat, Bhavinkumar, PA  carvedilol (COREG) 3.125 MG tablet Take 1 tablet (3.125 mg total) by mouth 2 (two) times daily with a meal. 07/01/19   Bhagat, Bhavinkumar, PA    Multiple Vitamins-Minerals (CENTRUM SILVER 50+MEN PO) Take 1 tablet by mouth daily.    [provider]  nitroGLYCERIN (NITROSTAT) 0.4 MG SL tablet Place 1 tablet (0.4 mg total) under the tongue every 5 (five) minutes as needed for chest pain. 04/14/17   Lorretta Harp, MD  Testosterone 12.5 MG/ACT (1%) GEL Place 1 Act onto the skin daily. 07/12/18   Janith Lima, MD  ticagrelor (BRILINTA) 90 MG TABS tablet Take 1 tablet (90 mg total) by mouth 2 (two) times daily. 07/01/19   Leanor Kail, PA    Family History    Family History  Problem Relation Age of Onset   Heart failure Father    Coronary artery disease Sister 74   Cancer Neg Hx    COPD Neg Hx    Stroke Neg Hx    Kidney disease Neg Hx    Hypertension Neg Hx    Hyperlipidemia Neg Hx    Heart disease Neg Hx    Hearing loss Neg Hx    Early death Neg Hx    Drug abuse Neg Hx    Diabetes Neg Hx    Depression Neg Hx    He indicated that his mother is deceased. He indicated that his father is deceased. He indicated that both of his sisters are alive. He indicated that the status of his neg hx is unknown.  Social History    Social History   Socioeconomic History   Marital status: Married    Spouse name: Not on file   Number of children: Not on file   Years of education: Not on file   Highest education level: Not on file  Occupational History   Not on file  Social Needs   Financial resource strain: Not hard at all   Food insecurity    Worry: Never true    Inability: Never true   Transportation needs    Medical: No    Non-medical: No  Tobacco Use   Smoking status: Never Smoker   Smokeless tobacco: Never Used  Substance and Sexual Activity   Alcohol use: Yes    Alcohol/week: 5.0 standard drinks    Types: 5 Shots of liquor per week    Comment: occassional   Drug use: No   Sexual activity: Yes    Birth control/protection: None  Lifestyle   Physical activity    Days per  week: 1 day    Minutes per session: 30 min   Stress: Not at all  Relationships   Social connections    Talks on phone: Twice a week    Gets together: Once a week    Attends religious service: Not on file    Active member of club or organization: Not on file    Attends meetings of clubs or organizations: Not on file    Relationship status: Married   Intimate partner violence    Fear of current or ex partner: No    Emotionally abused: No    Physically abused: No    Forced sexual activity: No  Other Topics Concern   Not on file  Social History Narrative   Not on file     Review of Systems    General:  No chills, fever, night sweats or weight changes.  Cardiovascular:  No chest pain, dyspnea on exertion, edema, orthopnea, palpitations, paroxysmal nocturnal dyspnea. Dermatological: No rash, lesions/masses Respiratory: No cough, dyspnea Urologic: No hematuria, dysuria Abdominal:   No nausea, vomiting, diarrhea, bright red blood per rectum, melena, or hematemesis Neurologic:  No visual changes, wkns, changes in mental status. All other systems reviewed and are otherwise negative except as noted above.  Physical Exam    VS:  BP 115/75    Pulse 71    Ht 6' (1.829 m)    Wt 206 lb (93.4 kg)    SpO2 97%    BMI 27.94 kg/m  , BMI Body mass index is 27.94 kg/m. GEN: Well nourished, well developed, in no acute distress. HEENT: normal. Neck: Supple, no JVD, carotid bruits, or masses. Cardiac: RRR, no murmurs, rubs, or gallops. No clubbing, cyanosis, edema.  Radials/DP/PT 2+ and equal bilaterally.  Right radial cath site clean, dry, and intact. no drainage. Respiratory:  Respirations regular and unlabored, clear to auscultation bilaterally. GI: Soft, nontender, nondistended, BS + x 4. MS: no deformity or atrophy. Skin: warm and dry, no rash. Neuro:  Strength and sensation are intact. Psych: Normal affect.  Accessory Clinical Findings    ECG personally reviewed by me today-normal  sinus rhythm, 69 bpm, low voltage QRS- No acute changes  EKG 07/01/2019: Normal sinus rhythm 71 bpm  EKG 06/30/2019: Normal sinus rhythm 63 bpm low voltage QRS  Echocardiogram 06/29/2019  IMPRESSIONS   1. The left ventricle has mildly reduced systolic function, with an ejection fraction of 45-50%. The cavity size was normal. Left ventricular diastolic Doppler parameters are consistent with impaired relaxation. Indeterminate filling pressures The E/e'  is 8-15.  2. Severe hypokinesis of the left ventricular, mid-apical anterior wall and apical segment.  3. The mitral valve is grossly normal.  4. The tricuspid valve is grossly normal.  5. The aortic valve is tricuspid. No stenosis of the aortic valve.  6. The aorta is normal in size and structure. Assessment & Plan   1.  Anterior STEMI- EKG normal sinus rhythm 07/08/2019.  No chest pain. echocardiogram 06/29/2019 LVEF 45 to 50%.  Left ventricular diastolic parameters consistent with impaired relaxation.  Severe hypokinesis of left ventricular, mid apical anterior wall and apical segment. Continue carvedilol 3.125 mg twice daily Continue aspirin 81 mg tablet daily Continue Brilinta 90 mg tablet twice daily Consider ACE/ARB in the future, not prescribed today due to soft blood pressures and patient not taking blood pressure medication prior to today's visit. Increase physical activity as tolerated Heart healthy low-sodium diet  2.  Hyperlipidemia- 06/29/2019: Cholesterol 143; HDL 44; LDL Cholesterol 93; Triglycerides 28; VLDL 6.  Goal less than LDL 70 Continue Lipitor 80 mg tablet daily Increase physical activity as tolerated Heart healthy low-sodium diet  3.  Acute kidney injury- creatinine increased to 1.39 status post cardiac catheterization 06/29/2019 BMP 07/06/2019 indicates creatinine 0.99 Resolved  Deberah Pelton, NP-C 07/08/2019, 9:02 AM

## 2019-07-08 NOTE — Patient Instructions (Addendum)
Medication Instructions:  Patient assistance paperwork given for Brilinta  If you need a refill on your cardiac medications before your next appointment, please call your pharmacy.   Lab work: Your physician recommends that you return for lab work in: TODAY-BMET If you have labs (blood work) drawn today and your tests are completely normal, you will receive your results only by: Marland Kitchen MyChart Message (if you have MyChart) OR . A paper copy in the mail If you have any lab test that is abnormal or we need to change your treatment, we will call you to review the results.  Testing/Procedures: NONE   Follow-Up: At [facility], you and your health needs are our priority.  As part of our continuing mission to provide you with exceptional heart care, we have created designated Provider Care Teams.  These Care Teams include your primary Cardiologist (physician) and Advanced Practice Providers (APPs -  Physician Assistants and Nurse Practitioners) who all work together to provide you with the care you need, when you need it. You will need a follow up appointment in 3 months.  Please call our office 2 months in advance to schedule this appointment.  You may see Quay Burow, MD or one of the following Advanced Practice Providers on your designated Care Team:   Kerin Ransom, PA-C Roby Lofts, [place] . Sande Rives, PA-C  Any Other Special Instructions Will Be Listed Below (If Applicable). INCREASE YOUR PHYSICAL ACTIVITY EAT A HEART HEALTHY DIET CHECK YOUR BLOOD PRESSURE EVERY DAY AND RECORD YOUR READINGS BRING YOUR READINGS TO YOUR NEXT APPOINTMENT     Heart-Healthy Eating Plan Heart-healthy meal planning includes:  Eating less unhealthy fats.  Eating more healthy fats.  Making other changes in your diet. Talk with your doctor or a diet specialist (dietitian) to create an eating plan that is right for you. What is my plan? Your doctor may recommend an eating plan that includes:  Total  fat: ______% or less of total calories a day.  Saturated fat: ______% or less of total calories a day.  Cholesterol: less than _________mg a day. What are tips for following this plan? Cooking Avoid frying your food. Try to bake, boil, grill, or broil it instead. You can also reduce fat by:  Removing the skin from poultry.  Removing all visible fats from meats.  Steaming vegetables in water or broth. Meal planning   At meals, divide your plate into four equal parts: ? Fill one-half of your plate with vegetables and green salads. ? Fill one-fourth of your plate with whole grains. ? Fill one-fourth of your plate with lean protein foods.  Eat 4-5 servings of vegetables per day. A serving of vegetables is: ? 1 cup of raw or cooked vegetables. ? 2 cups of raw leafy greens.  Eat 4-5 servings of fruit per day. A serving of fruit is: ? 1 medium whole fruit. ?  cup of dried fruit. ?  cup of fresh, frozen, or canned fruit. ?  cup of 100% fruit juice.  Eat more foods that have soluble fiber. These are apples, broccoli, carrots, beans, peas, and barley. Try to get 20-30 g of fiber per day.  Eat 4-5 servings of nuts, legumes, and seeds per week: ? 1 serving of dried beans or legumes equals  cup after being cooked. ? 1 serving of nuts is  cup. ? 1 serving of seeds equals 1 tablespoon. General information  Eat more home-cooked food. Eat less restaurant, buffet, and fast food.  Limit  or avoid alcohol.  Limit foods that are high in starch and sugar.  Avoid fried foods.  Lose weight if you are overweight.  Keep track of how much salt (sodium) you eat. This is important if you have high blood pressure. Ask your doctor to tell you more about this.  Try to add vegetarian meals each week. Fats  Choose healthy fats. These include olive oil and canola oil, flaxseeds, walnuts, almonds, and seeds.  Eat more omega-3 fats. These include salmon, mackerel, sardines, tuna, flaxseed  oil, and ground flaxseeds. Try to eat fish at least 2 times each week.  Check food labels. Avoid foods with trans fats or high amounts of saturated fat.  Limit saturated fats. ? These are often found in animal products, such as meats, butter, and cream. ? These are also found in plant foods, such as palm oil, palm kernel oil, and coconut oil.  Avoid foods with partially hydrogenated oils in them. These have trans fats. Examples are stick margarine, some tub margarines, cookies, crackers, and other baked goods. What foods can I eat? Fruits All fresh, canned (in natural juice), or frozen fruits. Vegetables Fresh or frozen vegetables (raw, steamed, roasted, or grilled). Green salads. Grains Most grains. Choose whole wheat and whole grains most of the time. Rice and pasta, including brown rice and pastas made with whole wheat. Meats and other proteins Lean, well-trimmed beef, veal, pork, and lamb. Chicken and [place] without skin. All fish and shellfish. Wild duck, rabbit, pheasant, and venison. Egg whites or low-cholesterol egg substitutes. Dried beans, peas, lentils, and tofu. Seeds and most nuts. Dairy Low-fat or nonfat cheeses, including ricotta and mozzarella. Skim or 1% milk that is liquid, powdered, or evaporated. Buttermilk that is made with low-fat milk. Nonfat or low-fat yogurt. Fats and oils Non-hydrogenated (trans-free) margarines. Vegetable oils, including soybean, sesame, sunflower, olive, peanut, safflower, corn, canola, and cottonseed. Salad dressings or mayonnaise made with a vegetable oil. Beverages Mineral water. Coffee and tea. Diet carbonated beverages. Sweets and desserts Sherbet, gelatin, and fruit ice. Small amounts of dark chocolate. Limit all sweets and desserts. Seasonings and condiments All seasonings and condiments. The items listed above may not be a complete list of foods and drinks you can eat. Contact a dietitian for more options. What foods should I  avoid? Fruits Canned fruit in heavy syrup. Fruit in cream or butter sauce. Fried fruit. Limit coconut. Vegetables Vegetables cooked in cheese, cream, or butter sauce. Fried vegetables. Grains Breads that are made with saturated or trans fats, oils, or whole milk. Croissants. Sweet rolls. Donuts. High-fat crackers, such as cheese crackers. Meats and other proteins Fatty meats, such as hot dogs, ribs, sausage, bacon, rib-eye roast or steak. High-fat deli meats, such as salami and bologna. Caviar. Domestic duck and goose. Organ meats, such as liver. Dairy Cream, sour cream, cream cheese, and creamed cottage cheese. Whole-milk cheeses. Whole or 2% milk that is liquid, evaporated, or condensed. Whole buttermilk. Cream sauce or high-fat cheese sauce. Yogurt that is made from whole milk. Fats and oils Meat fat, or shortening. Cocoa butter, hydrogenated oils, palm oil, coconut oil, palm kernel oil. Solid fats and shortenings, including bacon fat, salt pork, lard, and butter. Nondairy cream substitutes. Salad dressings with cheese or sour cream. Beverages Regular sodas and juice drinks with added sugar. Sweets and desserts Frosting. Pudding. Cookies. Cakes. Pies. Milk chocolate or white chocolate. Buttered syrups. Full-fat ice cream or ice cream drinks. The items listed above may not be a complete list  of foods and drinks to avoid. Contact a dietitian for more information. Summary  Heart-healthy meal planning includes eating less unhealthy fats, eating more healthy fats, and making other changes in your diet.  Eat a balanced diet. This includes fruits and vegetables, low-fat or nonfat dairy, lean protein, nuts and legumes, whole grains, and heart-healthy oils and fats. This information is not intended to replace advice given to you by your health care provider. Make sure you discuss any questions you have with your health care provider. Document Released: 05/19/2012 Document Revised: 01/22/2018  Document Reviewed: 12/26/2017 Elsevier Patient Education  2020 [employer].     How to Increase Your Level of Physical Activity  Getting regular physical activity is important for your overall health and well-being. Most people do not get enough exercise. There are easy ways to increase your level of physical activity, even if you have not been very active in the past or you are just starting out. Why is physical activity important? Physical activity has many short-term and long-term health benefits. Regular exercise can:  Help you lose weight or maintain a healthy weight.  Strengthen your muscles and bones.  Boost your mood and improve self-esteem.  Reduce your risk of certain long-term (chronic) diseases, like heart disease, cancer, and diabetes.  Help you stay capable of walking and moving around (mobile) as you age.  Prevent accidents, such as falls, as you age.  Increase life expectancy. What are the benefits of being physically active on a regular basis? In addition to improving your physical health, being physically active on most days of the week can help you in ways that you may not expect. Benefits of regular physical activity may include:  Feeling good about your body.  Being able to move around more easily and for longer periods of time without getting tired (increased stamina).  Finding new sources of fun and enjoyment.  Meeting new people who share a common interest.  Being able to fight off illness better (enhanced immunity).  Being able to sleep better. What can happen if I am not physically active on a regular basis? Not getting enough physical activity can lead to an unhealthy lifestyle and future health problems. This can increase your chances of:  Becoming overweight or obese.  Becoming sick.  Developing chronic illnesses, like heart disease or diabetes.  Having mental health problems, like depression or anxiety.  Having sleep  problems.  Having trouble walking or getting yourself around (reduced mobility).  Injuring yourself in a fall as you get older. What steps can I take to be more physically active?   Check with your health care provider about how to get started. Ask your health care provider what activities are safe for you.  Start out slowly. Walking or doing some simple chair exercises is a good place to start, especially if you have not been active before or for a long time.  Try to find activities that you enjoy. You are more likely to commit to an exercise routine if it does not feel like a chore.  If you have bone or joint problems, choose low-impact exercises, like walking or swimming.  Include physical activity in your everyday routine.  Invite friends or family members to exercise with you. This also will help you commit to your workout plan.  Set goals that you can work toward.  Aim for at least 150 minutes of moderate-intensity exercise each week. Examples of moderate-intensity exercise include walking or riding a bike. Where to  find more information  Centers for Disease Control and Prevention: BowlingGrip.is  President's Council on Fitness, Sports & Nutrition www.http://villegas.org/  ChooseMyPlate: [employer].dk Contact a health care provider if:  You have headaches, muscle aches, or joint pain.  You feel dizzy or light-headed while exercising.  You faint.  You have chest pain while exercising. Summary  Exercise benefits your mind and body at any age, even if you are just starting out.  If you have a chronic illness or have not been active for a while, check with your health care provider before increasing your physical activity.  Choose activities that are safe and enjoyable for you. Ask your health care provider what activities are safe for you.  Start slowly. Tell your health care provider if you have problems as  you start to increase your activity level. This information is not intended to replace advice given to you by your health care provider. Make sure you discuss any questions you have with your health care provider. Document Released: 11/07/2016 Document Revised: 03/15/2019 Document Reviewed: 11/07/2016 Elsevier Patient Education  2020 [employer].

## 2019-07-14 ENCOUNTER — Encounter (HOSPITAL_COMMUNITY): Payer: Self-pay

## 2019-07-22 DIAGNOSIS — D2239 Melanocytic nevi of other parts of face: Secondary | ICD-10-CM | POA: Diagnosis not present

## 2019-07-22 DIAGNOSIS — C44311 Basal cell carcinoma of skin of nose: Secondary | ICD-10-CM | POA: Diagnosis not present

## 2019-07-22 DIAGNOSIS — D485 Neoplasm of uncertain behavior of skin: Secondary | ICD-10-CM | POA: Diagnosis not present

## 2019-07-22 DIAGNOSIS — L570 Actinic keratosis: Secondary | ICD-10-CM | POA: Diagnosis not present

## 2019-07-22 DIAGNOSIS — C44319 Basal cell carcinoma of skin of other parts of face: Secondary | ICD-10-CM | POA: Diagnosis not present

## 2019-07-22 DIAGNOSIS — Z85828 Personal history of other malignant neoplasm of skin: Secondary | ICD-10-CM | POA: Diagnosis not present

## 2019-07-25 ENCOUNTER — Other Ambulatory Visit: Payer: Self-pay | Admitting: Cardiovascular Disease

## 2019-07-26 ENCOUNTER — Telehealth (HOSPITAL_COMMUNITY): Payer: Self-pay | Admitting: *Deleted

## 2019-07-26 NOTE — Telephone Encounter (Signed)
Pt returned call from message left earlier this month and letter sent regarding contacting Cardiac Rehab.  Pt previously participated about 2 1/2 years ago.  Pt asked what had changed since then.  Reviewed with pt the safety measures we have in place due to Covid-19. Pt hesitant due to the removal of stationary bikes based upon national recommendations. This was his favorite equipment. Pt would like to begin at a much higher pace of workloads. Reminded pt that he would complete a 6 minute walk test which would be used to develop his exercise prescription and treatment plan. Pt feels he could do more since he participated before in [facility].  Advised pt that his exercise will be individualized.  Pt asked could he come less than 9 weeks. Pt informed that he should come at least a month.  Pt with frequent pauses during the conversation.  Pt would like to think about it and get back to [place]. Cherre Huger, BSN Cardiac and Training and development officer

## 2019-07-27 ENCOUNTER — Other Ambulatory Visit: Payer: Self-pay

## 2019-07-27 ENCOUNTER — Ambulatory Visit (INDEPENDENT_AMBULATORY_CARE_PROVIDER_SITE_OTHER): Payer: Medicare Other

## 2019-07-27 DIAGNOSIS — Z23 Encounter for immunization: Secondary | ICD-10-CM | POA: Diagnosis not present

## 2019-08-04 DIAGNOSIS — C44311 Basal cell carcinoma of skin of nose: Secondary | ICD-10-CM | POA: Diagnosis not present

## 2019-08-04 DIAGNOSIS — Z85828 Personal history of other malignant neoplasm of skin: Secondary | ICD-10-CM | POA: Diagnosis not present

## 2019-08-04 DIAGNOSIS — C44329 Squamous cell carcinoma of skin of other parts of face: Secondary | ICD-10-CM | POA: Diagnosis not present

## 2019-08-11 HISTORY — PX: NOSE SURGERY: SHX723

## 2019-09-16 ENCOUNTER — Encounter: Payer: Self-pay | Admitting: Internal Medicine

## 2019-09-16 ENCOUNTER — Ambulatory Visit (INDEPENDENT_AMBULATORY_CARE_PROVIDER_SITE_OTHER): Payer: Medicare Other | Admitting: Internal Medicine

## 2019-09-16 ENCOUNTER — Other Ambulatory Visit: Payer: Self-pay

## 2019-09-16 VITALS — BP 130/80 | HR 66 | Temp 97.7°F | Ht 72.0 in | Wt 206.0 lb

## 2019-09-16 DIAGNOSIS — I2510 Atherosclerotic heart disease of native coronary artery without angina pectoris: Secondary | ICD-10-CM

## 2019-09-16 DIAGNOSIS — F4323 Adjustment disorder with mixed anxiety and depressed mood: Secondary | ICD-10-CM | POA: Diagnosis not present

## 2019-09-16 DIAGNOSIS — Z9861 Coronary angioplasty status: Secondary | ICD-10-CM

## 2019-09-16 MED ORDER — SERTRALINE HCL 50 MG PO TABS: 50.0000 mg | ORAL_TABLET | Freq: Every day | ORAL | 3 refills | Status: DC

## 2019-09-16 NOTE — Patient Instructions (Signed)

## 2019-09-16 NOTE — Progress Notes (Signed)
   Subjective:   Patient ID: John Mccarthy, male    DOB: Apr 09, 1951, 68 y.o.   MRN: [id number]  HPI The patient is a 68 YO man coming in for concerns about depression. His daughter is a Teacher, music and thinks he needs to start zoloft. He has recently had stents placed for STEMI in July 2020 which was a revision from earlier this year and since that time he is just not as motivated. There is some loss of trust with his body as this was a second heart attack close to the first. He likes to be very active and is worried that this will reduce his capacity. Plays pickle ball and prior to the heart attack did this for about 1-2 hours. Denies SI/HI. Denies current chest pains or pressure.   Review of Systems  Constitutional: Negative.   HENT: Negative.   Eyes: Negative.   Respiratory: Negative for cough, chest tightness and shortness of breath.   Cardiovascular: Negative for chest pain, palpitations and leg swelling.  Gastrointestinal: Negative for abdominal distention, abdominal pain, constipation, diarrhea, nausea and vomiting.  Musculoskeletal: Negative.   Skin: Negative.   Neurological: Negative.   Psychiatric/Behavioral: Positive for decreased concentration and dysphoric mood.    Objective:  Physical Exam Constitutional:      Appearance: He is well-developed.  HENT:     Head: Normocephalic and atraumatic.  Neck:     Musculoskeletal: Normal range of motion.  Cardiovascular:     Rate and Rhythm: Normal rate and regular rhythm.  Pulmonary:     Effort: Pulmonary effort is normal. No respiratory distress.     Breath sounds: Normal breath sounds. No wheezing or rales.  Abdominal:     General: Bowel sounds are normal. There is no distension.     Palpations: Abdomen is soft.     Tenderness: There is no abdominal tenderness. There is no rebound.  Skin:    General: Skin is warm and dry.  Neurological:     Mental Status: He is alert and oriented to person, place, and time.   Coordination: Coordination normal.     Vitals:   09/16/19 1358  BP: 130/80  Pulse: 66  Temp: 97.7 F (36.5 C)  TempSrc: Oral  SpO2: 97%  Weight: 206 lb (93.4 kg)  Height: 6' (1.829 m)    Assessment & Plan:  Visit time 25 minutes: greater than 50% of that time was spent in face to face counseling and coordination of care with the patient: counseled about typical course and options to treat his adjustment disorder as well as other coping skills

## 2019-09-17 DIAGNOSIS — F4320 Adjustment disorder, unspecified: Secondary | ICD-10-CM | POA: Insufficient documentation

## 2019-09-17 NOTE — Assessment & Plan Note (Signed)
Rx zoloft 50 mg daily and talked about treatment expectations and that trust with his heart and body will likely take time. Encouraged him to talk with his cardiologist about any expectations of physical activity. Counseled about potential side effects.

## 2019-10-08 ENCOUNTER — Ambulatory Visit (INDEPENDENT_AMBULATORY_CARE_PROVIDER_SITE_OTHER): Payer: Medicare Other | Admitting: Cardiovascular Disease

## 2019-10-08 ENCOUNTER — Other Ambulatory Visit: Payer: Self-pay

## 2019-10-08 ENCOUNTER — Encounter: Payer: Self-pay | Admitting: Cardiovascular Disease

## 2019-10-08 VITALS — BP 118/64 | HR 67 | Temp 97.2°F | Ht 72.0 in | Wt 199.0 lb

## 2019-10-08 DIAGNOSIS — I2510 Atherosclerotic heart disease of native coronary artery without angina pectoris: Secondary | ICD-10-CM | POA: Diagnosis not present

## 2019-10-08 DIAGNOSIS — E785 Hyperlipidemia, unspecified: Secondary | ICD-10-CM

## 2019-10-08 DIAGNOSIS — I519 Heart disease, unspecified: Secondary | ICD-10-CM | POA: Diagnosis not present

## 2019-10-08 DIAGNOSIS — I255 Ischemic cardiomyopathy: Secondary | ICD-10-CM

## 2019-10-08 DIAGNOSIS — Z9861 Coronary angioplasty status: Secondary | ICD-10-CM | POA: Diagnosis not present

## 2019-10-08 LAB — HEPATIC FUNCTION PANEL
ALT: 37 IU/L (ref 0–44)
AST: 33 IU/L (ref 0–40)
Albumin: 4.1 g/dL (ref 3.8–4.8)
Alkaline Phosphatase: 113 IU/L (ref 39–117)
Bilirubin Total: 0.8 mg/dL (ref 0.0–1.2)
Bilirubin, Direct: 0.23 mg/dL (ref 0.00–0.40)
Total Protein: 6.8 g/dL (ref 6.0–8.5)

## 2019-10-08 LAB — LIPID PANEL
Chol/HDL Ratio: 2.4 ratio (ref 0.0–5.0)
Cholesterol, Total: 105 mg/dL (ref 100–199)
HDL: 43 mg/dL (ref >39)
LDL Chol Calc (NIH): 44 mg/dL (ref 0–99)
Triglycerides: 95 mg/dL (ref 0–149)
VLDL Cholesterol Cal: 18 mg/dL (ref 5–40)

## 2019-10-08 MED ORDER — CARVEDILOL 6.25 MG PO TABS: 6.2500 mg | ORAL_TABLET | Freq: Two times a day (BID) | ORAL | 3 refills | Status: DC

## 2019-10-08 NOTE — Assessment & Plan Note (Signed)
EF 45 to 50% post anterior STEMI.  He is asymptomatic.  I am going to slightly uptitrate his carvedilol from 3.25mg  to 6.25 mg p.o. twice daily.  He checks his blood pressure at home religiously.  We will check a 2D echo in 2 months.  I do not think he has blood pressure to allow initiation of ACE-inhibitor therapy

## 2019-10-08 NOTE — Assessment & Plan Note (Signed)
History of hyperlipidemia on high-dose statin therapy with recent lipid profile performed 06/29/2019 revealing total cholesterol 143, LDL 44 HDL of 93.  He is not at goal for secondary prevention.  And plan to recheck a lipid liver profile today and if felt LDL is not less than 70 we will add Zetia 10 mg a day and recheck.  If after that he is not at goal we will consider Repatha.

## 2019-10-08 NOTE — Progress Notes (Signed)
10/08/2019 [name]   1951-07-08  [id number]  Primary Physician Janith Lima, MD Primary Cardiologist: Lorretta Harp MD Lupe Carney, [place]  HPI:  John Mccarthy is a 68 y.o.  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 He is brought to the cath lab emergently and radial cath by myself revealing an ostial LAD occlusion which I stented with a synergy (3 mm x 12 mm)drug-eluting stent. He did have a 60% proximal to mid dominant RCA stenosis which I elected to treat medically. EF was 50% with anteroapical wall motion abnormality.  His door to balloon time was 22 minutes.  Follow-up 2-D echocardiogram performed 06/24/17 revealed normalization of LV function.  He was on dual antiplatelet therapy including aspirin and Plavix until recently when he discontinued the Plavix which is appropriate.  He is asymptomatic back playing tennisseveral times a week including singles tennis 1-1/2 hours at a time without limitation.   I last saw him in the hospital this past July when he presented with an anterior STEMI with occlusion of the previously placed stent.  I intervened on him 06/28/2019 and placed 3 additional stents in his proximal LAD.  His durable in time was 46 minutes, delayed because of hypotension and reperfusion arrhythmias.  He did have a 99% ostial first diagonal branch stenosis which was jailed vessel and up in the previously placed stents as well as a 70% mid dominant RCA stenosis that was stable.  His EF was 45 to 50% with anteroapical hypokinesia.  He is asymptomatic.  He is out walking every day.  He remains on dual antiplatelet therapy as well as high-dose statin therapy.  He checks his blood pressure at home religiously which runs in the 110/70 range.     Current Meds  Medication Sig  . aspirin EC 81 MG tablet Take 1 tablet (81 mg total) by mouth daily.  Marland Kitchen atorvastatin (LIPITOR) 80 MG tablet Take 1 tablet (80 mg total) by mouth daily at 6 PM.  . carvedilol (COREG) 6.25 MG tablet Take 1 tablet (6.25 mg total) by mouth 2 (two) times daily with a meal.  . Multiple Vitamins-Minerals (CENTRUM SILVER 50+MEN PO) Take 1 tablet by mouth daily.  . sertraline (ZOLOFT) 50 MG tablet Take 1 tablet (50 mg total) by mouth daily.  . ticagrelor (BRILINTA) 90 MG TABS tablet Take 1 tablet (90 mg total) by mouth 2 (two) times daily.  . [DISCONTINUED] carvedilol (COREG) 3.125 MG tablet Take 1 tablet (3.125 mg total) by mouth 2 (two) times daily with a meal.     Allergies  Allergen Reactions  . Crestor [Rosuvastatin Calcium] Other (See Comments)    Muscle aches  . Lactose Intolerance (Gi) Diarrhea    Social History   Socioeconomic History  . Marital status: Married    Spouse name: Not on file  . Number of children: Not on file  . Years of education: Not on file  . Highest education level: Not on file  Occupational History  . Not on file  Social Needs  . Financial resource strain: Not hard at all  . Food insecurity    Worry: Never true    Inability: Never true  . Transportation needs    Medical: No    Non-medical: No  Tobacco Use  .  Smoking status: Never Smoker  . Smokeless tobacco: Never Used  Substance and Sexual Activity  . Alcohol use: Yes    Alcohol/week: 5.0 standard drinks    Types: 5 Shots of liquor per week    Comment: occassional  . Drug use: No  . Sexual activity: Yes    Birth control/protection: None  Lifestyle  . Physical activity    Days per week: 1 day    Minutes per session: 30 min  . Stress: Not at all  Relationships  . Social Herbalist on phone: Twice a week    Gets together: Once a week    Attends religious service: Not on file    Active member of club or organization: Not on file    Attends meetings of  clubs or organizations: Not on file    Relationship status: Married  . Intimate partner violence    Fear of current or ex partner: No    Emotionally abused: No    Physically abused: No    Forced sexual activity: No  Other Topics Concern  . Not on file  Social History Narrative  . Not on file     Review of Systems: General: negative for chills, fever, night sweats or weight changes.  Cardiovascular: negative for chest pain, dyspnea on exertion, edema, orthopnea, palpitations, paroxysmal nocturnal dyspnea or shortness of breath Dermatological: negative for rash Respiratory: negative for cough or wheezing Urologic: negative for hematuria Abdominal: negative for nausea, vomiting, diarrhea, bright red blood per rectum, melena, or hematemesis Neurologic: negative for visual changes, syncope, or dizziness All other systems reviewed and are otherwise negative except as noted above.    Blood pressure 118/64, pulse 67, temperature (!) 97.2 F (36.2 C), height 6' (1.829 m), weight 199 lb (90.3 kg).  General appearance: alert and no distress Neck: no adenopathy, no carotid bruit, no JVD, supple, symmetrical, trachea midline and thyroid not enlarged, symmetric, no tenderness/mass/nodules Lungs: clear to auscultation bilaterally Heart: regular rate and rhythm, S1, S2 normal, no murmur, click, rub or gallop Extremities: extremities normal, atraumatic, no cyanosis or edema Pulses: 2+ and symmetric Skin: Skin color, texture, turgor normal. No rashes or lesions Neurologic: Alert and oriented X 3, normal strength and tone. Normal symmetric reflexes. Normal coordination and gait  EKG not performed today  ASSESSMENT AND PLAN:   Hyperlipidemia with target LDL less than 130 History of hyperlipidemia on high-dose statin therapy with recent lipid profile performed 06/29/2019 revealing total cholesterol 143, LDL 44 HDL of 93.  He is not at goal for secondary prevention.  And plan to recheck a lipid  liver profile today and if felt LDL is not less than 70 we will add Zetia 10 mg a day and recheck.  If after that he is not at goal we will consider Repatha.  CAD S/P percutaneous coronary angioplasty History of CAD status post anterior STEMI 02/11/2017 treated with PCI and drug-eluting stenting.  Did have mild AV groove circumflex disease and 70% mid dominant RCA stenosis.  His EF ultimately improved to 60 to 65% several months after his initial event.  He represented 06/28/2019 with an anterior STEMI related to occlusion of the previously placed stent.  He had 3 additional stents placed restoring antegrade flow with a door to balloon time of 46 minutes.  The procedure was complicated by reperfusion arrhythmias and hypotension which ultimately improved.  Did have a high-grade stenosis within a jailed diagonal branch of the ostium and the RCA stenosis had  not changed.  His EF was in the 45 to 50% range with anteroapical hypokinesia.  He is on low-dose ARB carvedilol as well as dual antiplatelet therapy including aspirin and Brilinta.  We will need to maintain him on dual antiplatelet therapy indefinitely given his multiple stents in his proximal LAD.  Ischemic cardiomyopathy EF 45 to 50% post anterior STEMI.  He is asymptomatic.  I am going to slightly uptitrate his carvedilol from 3.25mg  to 6.25 mg p.o. twice daily.  He checks his blood pressure at home religiously.  We will check a 2D echo in 2 months.  I do not think he has blood pressure to allow initiation of ACE-inhibitor therapy      Lorretta Harp MD [facility], [facility] 10/08/2019 8:30 AM

## 2019-10-08 NOTE — Assessment & Plan Note (Signed)
History of CAD status post anterior STEMI 02/11/2017 treated with PCI and drug-eluting stenting.  Did have mild AV groove circumflex disease and 70% mid dominant RCA stenosis.  His EF ultimately improved to 60 to 65% several months after his initial event.  He represented 06/28/2019 with an anterior STEMI related to occlusion of the previously placed stent.  He had 3 additional stents placed restoring antegrade flow with a door to balloon time of 46 minutes.  The procedure was complicated by reperfusion arrhythmias and hypotension which ultimately improved.  Did have a high-grade stenosis within a jailed diagonal branch of the ostium and the RCA stenosis had not changed.  His EF was in the 45 to 50% range with anteroapical hypokinesia.  He is on low-dose ARB carvedilol as well as dual antiplatelet therapy including aspirin and Brilinta.  We will need to maintain him on dual antiplatelet therapy indefinitely given his multiple stents in his proximal LAD.

## 2019-10-08 NOTE — Patient Instructions (Addendum)
Medication Instructions:  INCREASE the Carvedilol to 6.25 mg twice daily  *If you need a refill on your cardiac medications before your next appointment, please call your pharmacy*  Lab Work: Your provider would like for you to have the following labs today: Lipid and Liver  If you have labs (blood work) drawn today and your tests are completely normal, you will receive your results only by: Marland Kitchen MyChart Message (if you have MyChart) OR . A paper copy in the mail If you have any lab test that is abnormal or we need to change your treatment, we will call you to review the results.  Testing/Procedures: Your physician has requested that you have an echocardiogram in 2 months. Echocardiography is a painless test that uses sound waves to create images of your heart. It provides your doctor with information about the size and shape of your heart and how well your heart's chambers and valves are working. You may receive an ultrasound enhancing agent through an IV if needed to better visualize your heart during the echo.This procedure takes approximately one hour. There are no restrictions for this procedure. This will take place at the [address].    Follow-Up: At [facility], you and your health needs are our priority.  As part of our continuing mission to provide you with exceptional heart care, we have created designated Provider Care Teams.  These Care Teams include your primary Cardiologist (physician) and Advanced Practice Providers (APPs -  Physician Assistants and Nurse Practitioners) who all work together to provide you with the care you need, when you need it.  Your next appointment:   6 months  The format for your next appointment:   In Person  Provider:   Quay Burow, MD   Please follow up with Coletta Memos, NP in 3 months.

## 2019-10-27 ENCOUNTER — Other Ambulatory Visit: Payer: Self-pay

## 2019-12-08 ENCOUNTER — Ambulatory Visit (HOSPITAL_COMMUNITY): Payer: Medicare Other | Attending: Cardiovascular Disease

## 2019-12-08 ENCOUNTER — Other Ambulatory Visit: Payer: Self-pay

## 2019-12-08 DIAGNOSIS — I255 Ischemic cardiomyopathy: Secondary | ICD-10-CM | POA: Diagnosis not present

## 2019-12-08 DIAGNOSIS — I519 Heart disease, unspecified: Secondary | ICD-10-CM | POA: Insufficient documentation

## 2020-01-01 ENCOUNTER — Ambulatory Visit: Payer: Medicare Other

## 2020-01-09 ENCOUNTER — Ambulatory Visit: Payer: Medicare Other | Attending: Internal Medicine

## 2020-01-09 DIAGNOSIS — Z23 Encounter for immunization: Secondary | ICD-10-CM | POA: Insufficient documentation

## 2020-01-09 NOTE — Progress Notes (Signed)
   Covid-19 Vaccination Clinic  Name:  John Mccarthy    MRN: [id number] DOB: 06-07-1951  01/09/2020  John Mccarthy was observed post Covid-19 immunization for 15 minutes without incidence. He was provided with Vaccine Information Sheet and instruction to access the V-Safe system.   John Mccarthy was instructed to call 911 with any severe reactions post vaccine: Marland Kitchen Difficulty breathing  . Swelling of your face and throat  . A fast heartbeat  . A bad rash all over your body  . Dizziness and weakness    Immunizations Administered    Name Date Dose VIS Date Route   Pfizer COVID-19 Vaccine 01/09/2020  8:23 AM 0.3 mL 11/12/2019 Intramuscular   Manufacturer: [place]   Lot: [id number]   [place]: [id number]

## 2020-01-12 ENCOUNTER — Ambulatory Visit: Payer: Medicare Other

## 2020-01-13 ENCOUNTER — Other Ambulatory Visit: Payer: Self-pay | Admitting: Physician Assistant

## 2020-01-14 ENCOUNTER — Ambulatory Visit: Payer: Medicare Other | Admitting: Cardiology

## 2020-01-18 DIAGNOSIS — H9113 Presbycusis, bilateral: Secondary | ICD-10-CM | POA: Diagnosis not present

## 2020-01-18 DIAGNOSIS — H903 Sensorineural hearing loss, bilateral: Secondary | ICD-10-CM | POA: Diagnosis not present

## 2020-01-18 DIAGNOSIS — H9313 Tinnitus, bilateral: Secondary | ICD-10-CM | POA: Diagnosis not present

## 2020-02-02 ENCOUNTER — Ambulatory Visit: Payer: Medicare Other | Attending: Internal Medicine

## 2020-02-02 DIAGNOSIS — Z23 Encounter for immunization: Secondary | ICD-10-CM | POA: Insufficient documentation

## 2020-02-02 NOTE — Progress Notes (Signed)
   Covid-19 Vaccination Clinic  Name:  AYDYN LIZZA    MRN: [id number] DOB: 03-26-1951  02/02/2020  Mr. Zamorski was observed post Covid-19 immunization for 15 minutes without incident. He was provided with Vaccine Information Sheet and instruction to access the V-Safe system.   Mr. W[place]ont was instructed to call 911 with any severe reactions post vaccine: Marland Kitchen Difficulty breathing  . Swelling of face and throat  . A fast heartbeat  . A bad rash all over body  . Dizziness and weakness   Immunizations Administered    Name Date Dose VIS Date Route   Pfizer COVID-19 Vaccine 02/02/2020  3:41 PM 0.3 mL 11/12/2019 Intramuscular   Manufacturer: [place]   Lot: [id number]   [place]: [id number]

## 2020-02-29 ENCOUNTER — Ambulatory Visit (INDEPENDENT_AMBULATORY_CARE_PROVIDER_SITE_OTHER): Payer: Medicare Other | Admitting: Cardiology

## 2020-02-29 ENCOUNTER — Encounter (HOSPITAL_COMMUNITY): Payer: Self-pay | Admitting: *Deleted

## 2020-02-29 ENCOUNTER — Telehealth (HOSPITAL_COMMUNITY): Payer: Self-pay

## 2020-02-29 ENCOUNTER — Other Ambulatory Visit: Payer: Self-pay

## 2020-02-29 ENCOUNTER — Encounter: Payer: Self-pay | Admitting: Cardiology

## 2020-02-29 VITALS — BP 125/70 | HR 68 | Temp 96.4°F | Ht 72.0 in | Wt 203.0 lb

## 2020-02-29 DIAGNOSIS — E785 Hyperlipidemia, unspecified: Secondary | ICD-10-CM | POA: Diagnosis not present

## 2020-02-29 DIAGNOSIS — I2510 Atherosclerotic heart disease of native coronary artery without angina pectoris: Secondary | ICD-10-CM | POA: Diagnosis not present

## 2020-02-29 DIAGNOSIS — I252 Old myocardial infarction: Secondary | ICD-10-CM

## 2020-02-29 DIAGNOSIS — Z86711 Personal history of pulmonary embolism: Secondary | ICD-10-CM | POA: Insufficient documentation

## 2020-02-29 DIAGNOSIS — Z9861 Coronary angioplasty status: Secondary | ICD-10-CM | POA: Diagnosis not present

## 2020-02-29 DIAGNOSIS — Z8719 Personal history of other diseases of the digestive system: Secondary | ICD-10-CM

## 2020-02-29 DIAGNOSIS — I255 Ischemic cardiomyopathy: Secondary | ICD-10-CM

## 2020-02-29 NOTE — Assessment & Plan Note (Signed)
LDL 44 in Nov 2020

## 2020-02-29 NOTE — Assessment & Plan Note (Addendum)
GI bleed 2013- transfused.  Endo showed antral ulcer and erosions (Dr Michail Sermon)

## 2020-02-29 NOTE — Telephone Encounter (Signed)
Pt insurance is active and benefits verified through Medicare a/b Co-pay 0, DED $203/$203 met, out of pocket 0/0 met, co-insurance 20%. no pre-authorization required. Passport, 02/29/2020@12 :02pm, REF# [id number]  Will contact patient to see if he is interested in the Cardiac Rehab Program. If interested, patient will need to complete follow up appt. Once completed, patient will be contacted for scheduling upon review by the RN Navigator.  2ndary insurance is active and benefits verified through [employer]. Co-pay 0, DED 0/0 met, out of pocket 0/0 met, co-insurance 0. No pre-authorization required.

## 2020-02-29 NOTE — Patient Instructions (Addendum)
Medication Instructions:  The current medical regimen is effective;  continue present plan and medications as directed. Please refer to the Current Medication list given to you today. *If you need a refill on your cardiac medications before your next appointment, please call your pharmacy*  Other Instructions Refer to cardiac rehab.  Follow-Up: Your next appointment:  6 month(s)  In Person with Quay Burow, MD.  At [facility], you and your health needs are our priority.  As part of our continuing mission to provide you with exceptional heart care, we have created designated Provider Care Teams.  These Care Teams include your primary Cardiologist (physician) and Advanced Practice Providers (APPs -  Physician Assistants and Nurse Practitioners) who all work together to provide you with the care you need, when you need it.  We recommend signing up for the patient portal called "MyChart".  Sign up information is provided on this After Visit Summary.  MyChart is used to connect with patients for Virtual Visits (Telemedicine).  Patients are able to view lab/test results, encounter notes, upcoming appointments, etc.  Non-urgent messages can be sent to your provider as well.   To learn more about what you can do with MyChart, go to [email or web address].

## 2020-02-29 NOTE — Progress Notes (Signed)
Cardiology Office Note:    Date:  02/29/2020   ID:  John Mccarthy, DOB July 29, 1951, MRN [id number]  PCP:  Janith Lima, MD  Cardiologist:  Quay Burow, MD  Electrophysiologist:  None   Referring MD: Janith Lima, MD   No chief complaint on file.   History of Present Illness:    John Mccarthy is a 69 y.o. male with a hx of an anterior MI in March 2018 while playing tennis.  The catheterization he had a total LAD which was treated with a DES.  At that time he had a residual 60% RCA.  His ejection fraction was 45 to 50%.  He did well after this.  Plavix was stopped in March 2020.  In July 2020 he was playing pickle ball and afterwards developed severe substernal chest pain, he presented with another anterior ST EMI.  When he presented he was hypotensive and had ventricular tachyarrhythmias.  Catheterization was done he had in-stent restenosis in the LAD.  He received 2 more LAD stents.  At that time he had a residual 70% RCA and 40% circumflex.  There was a small Dx1 that was 99% occluded.  He has done well since.  Echocardiogram December 08, 2019 showed an ejection fraction of 50 to 55%.  He last saw Dr. Alvester Chou November 2020.  He is in the office today for follow-up.  He is done well as far as not having chest pain.  He admits he has anxiety.  He is somewhat afraid to resume his usual activities, playing tennis and pickleball.  He is having no issues with his medications.  His lab work in November 2020 look good with an LDL of 44.  Past Medical History:  Diagnosis Date  . BPH (benign prostatic hyperplasia)   . CAD S/P percutaneous coronary angioplasty 2018, 2020   anterior STEMI twice- LAD DES  . GI bleeding 2013  . Hyperlipidemia   . Pulmonary embolism ([place]) 06/15/2013   after achilles tendon repair- Xarelto 6 months  . Skin cancer (melanoma) [facility])     Past Surgical History:  Procedure Laterality Date  . ACHILLES TENDON SURGERY Right 05/18/2013  . CARDIAC  CATHETERIZATION    . CORONARY STENT INTERVENTION N/A 02/11/2017   Procedure: Coronary Stent Intervention;  Surgeon: Lorretta Harp, MD;  Location: [place] [facility];  Service: Cardiovascular;  Laterality: N/A;  . CORONARY/GRAFT ACUTE MI REVASCULARIZATION N/A 06/28/2019   Procedure: Coronary/Graft Acute MI Revascularization;  Surgeon: Lorretta Harp, MD;  Location: [place] [facility];  Service: Cardiovascular;  Laterality: N/A;  . ESOPHAGOGASTRODUODENOSCOPY  09/24/2012   Procedure: ESOPHAGOGASTRODUODENOSCOPY (EGD);  Surgeon: Lear Ng, MD;  Location: [facility] ENDOSCOPY;  Service: Endoscopy;  Laterality: N/A;  . HAND SURGERY    . LEFT HEART CATH AND CORONARY ANGIOGRAPHY N/A 02/11/2017   Procedure: Left Heart Cath and Coronary Angiography;  Surgeon: Lorretta Harp, MD;  Location: [place] [facility];  Service: Cardiovascular;  Laterality: N/A;  . LEFT HEART CATH AND CORONARY ANGIOGRAPHY N/A 06/28/2019   Procedure: LEFT HEART CATH AND CORONARY ANGIOGRAPHY;  Surgeon: Lorretta Harp, MD;  Location: [place] [facility];  Service: Cardiovascular;  Laterality: N/A;  . NOSE SURGERY  08/11/2019   removal of melenoma     Current Medications: Current Meds  Medication Sig  . aspirin EC 81 MG tablet Take 1 tablet (81 mg total) by mouth daily.  Marland Kitchen atorvastatin (LIPITOR) 80 MG tablet TAKE 1 TABLET BY MOUTH EVERY DAY  AT 6 PM  . carvedilol (COREG) 6.25 MG tablet Take 1 tablet (6.25 mg total) by mouth 2 (two) times daily with a meal.  . Multiple Vitamins-Minerals (CENTRUM SILVER 50+MEN PO) Take 1 tablet by mouth daily.  . sertraline (ZOLOFT) 50 MG tablet Take 1 tablet (50 mg total) by mouth daily.  . ticagrelor (BRILINTA) 90 MG TABS tablet Take 1 tablet (90 mg total) by mouth 2 (two) times daily.     Allergies:   Crestor [rosuvastatin calcium] and Lactose intolerance (gi)   Social History   Socioeconomic History  . Marital status: Married    Spouse name: Not on file  . Number of children: Not  on file  . Years of education: Not on file  . Highest education level: Not on file  Occupational History  . Not on file  Tobacco Use  . Smoking status: Never Smoker  . Smokeless tobacco: Never Used  Substance and Sexual Activity  . Alcohol use: Yes    Alcohol/week: 5.0 standard drinks    Types: 5 Shots of liquor per week    Comment: occassional  . Drug use: No  . Sexual activity: Yes    Birth control/protection: None  Other Topics Concern  . Not on file  Social History Narrative  . Not on file   Social Determinants of Health   Financial Resource Strain: Low Risk   . Difficulty of Paying Living Expenses: Not hard at all  Food Insecurity: No Food Insecurity  . Worried About Charity fundraiser in the Last Year: Never true  . Ran Out of Food in the Last Year: Never true  Transportation Needs: No Transportation Needs  . Lack of Transportation (Medical): No  . Lack of Transportation (Non-Medical): No  Physical Activity: Insufficiently Active  . Days of Exercise per Week: 1 day  . Minutes of Exercise per Session: 30 min  Stress: No Stress Concern Present  . Feeling of Stress : Not at all  Social Connections: Unknown  . Frequency of Communication with Friends and Family: Twice a week  . Frequency of Social Gatherings with Friends and Family: Once a week  . Attends Religious Services: Not on file  . Active Member of Clubs or Organizations: Not on file  . Attends Archivist Meetings: Not on file  . Marital Status: Married     Family History: The patient's family history includes Coronary artery disease (age of onset: 8) in his sister; Heart failure in his father. There is no history of Cancer, COPD, Stroke, Kidney disease, Hypertension, Hyperlipidemia, Heart disease, Hearing loss, Early death, Drug abuse, Diabetes, or Depression.  ROS:   Please see the history of present illness.     All other systems reviewed and are negative.  EKGs/Labs/Other Studies Reviewed:     The following studies were reviewed today: Echo Jan 2021  EKG:  EKG is not ordered today.  The ekg ordered 07/22/2019 demonstrates  NSR, AS Qs  Recent Labs: 06/30/2019: Magnesium 2.2 07/06/2019: BUN 16; Creatinine, Ser 0.99; Hemoglobin 15.0; Platelets 219.0; Potassium 4.1; Sodium 139 10/08/2019: ALT 37  Recent Lipid Panel    Component Value Date/Time   CHOL 105 10/08/2019 0842   TRIG 95 10/08/2019 0842   HDL 43 10/08/2019 0842   CHOLHDL 2.4 10/08/2019 0842   CHOLHDL 3.3 06/29/2019 0002   VLDL 6 06/29/2019 0002   LDLCALC 44 10/08/2019 0842   LDLDIRECT 146.0 10/15/2016 1517    Physical Exam:    VS:  BP  125/70   Pulse 68   Temp (!) 96.4 F (35.8 C)   Ht 6' (1.829 m)   Wt 203 lb (92.1 kg)   SpO2 98%   BMI 27.53 kg/m     Wt Readings from Last 3 Encounters:  02/29/20 203 lb (92.1 kg)  10/08/19 199 lb (90.3 kg)  09/16/19 206 lb (93.4 kg)     GEN:  Well nourished, well developed in no acute distress HEENT: Normal NECK: No JVD; No carotid bruits CARDIAC: RRR, no murmurs, rubs, gallops RESPIRATORY:  Clear to auscultation without rales, wheezing or rhonchi  ABDOMEN: Soft, non-tender, non-distended MUSCULOSKELETAL:  No edema; No deformity  SKIN: Warm and dry NEUROLOGIC:  Alert and oriented x 3 PSYCHIATRIC:  Normal affect   ASSESSMENT:    History of ST elevation myocardial infarction (STEMI) STEMI March 2018- LAD PCI/DES STEMI July 2020 secondary to LAD ISR- 4 months after Plavix stopped  CAD S/P percutaneous coronary angioplasty S/P urgent LAD PCI with DES 02/11/17. Residual 60% RCA S/P urgent LAD PCI with DES secondary to ISR, 70% residual RCA, 40% CFX  Dyslipidemia, goal LDL below 70 LDL 44 in Nov 2020  Ischemic cardiomyopathy EF 45-50% at cath and by echo post MI 2020 EF 50-55% by echo Jan 2021  History of GI bleed GI bleed 2013- transfused.  Endo showed antral ulcer and erosions (Dr Michail Sermon)  History of pulmonary embolus (PE) PE 2014 after achilles  tendon repair- Rx'd with Xarelto for 6 months  PLAN:    I suggested cardiac rehab which he was agreeable to.  He will need to maintain dual antiplatelet therapy indefinitely given his multiple stents in his proximal LAD. F/U Dr Gwenlyn Found in 6 months.    Medication Adjustments/Labs and Tests Ordered: Current medicines are reviewed at length with the patient today.  Concerns regarding medicines are outlined above.  Orders Placed This Encounter  Procedures  . AMB referral to cardiac rehabilitation   No orders of the defined types were placed in this encounter.   Patient Instructions  Medication Instructions:  The current medical regimen is effective;  continue present plan and medications as directed. Please refer to the Current Medication list given to you today. *If you need a refill on your cardiac medications before your next appointment, please call your pharmacy*  Other Instructions Refer to cardiac rehab.  Follow-Up: Your next appointment:  6 month(s)  In Person with Quay Burow, MD.  At [facility], you and your health needs are our priority.  As part of our continuing mission to provide you with exceptional heart care, we have created designated Provider Care Teams.  These Care Teams include your primary Cardiologist (physician) and Advanced Practice Providers (APPs -  Physician Assistants and Nurse Practitioners) who all work together to provide you with the care you need, when you need it.  We recommend signing up for the patient portal called "MyChart".  Sign up information is provided on this After Visit Summary.  MyChart is used to connect with patients for Virtual Visits (Telemedicine).  Patients are able to view lab/test results, encounter notes, upcoming appointments, etc.  Non-urgent messages can be sent to your provider as well.   To learn more about what you can do with MyChart, go to [email or web address].        Angelena Form, PA-C  02/29/2020 8:46 AM     [facility]

## 2020-02-29 NOTE — Progress Notes (Signed)
Received referral for this pt to participate in outpatient cardiac rehab s/p STEMi and DES x 2 to LAD.  Awaiting signature by Dr. Gwenlyn Found for insurance reimbursement. Clinical review of pt follow up appt on 3/30 with Kerin Ransom PA with Dr. Gwenlyn Found - cardiologist office note. Pt is making the expected progress in recovery. Pt was contacted by outpatient cardiac rehab phase II staff in July 2020. Pt declined due to his high activity level and engagement to consistent exercise.  Noted in his office note, pt is not confident with resuming his usual activities.  Provider thought that cardiac rehab would be a benefit for him.  Pt appropriate for scheduling for on site cardiac rehab and/or enrollment in Virtual Cardiac Rehab.  Pt Covid score is 4.  Will forward to staff for follow up. Cherre Huger, BSN Cardiac and Training and development officer

## 2020-02-29 NOTE — Progress Notes (Deleted)
Medication Instructions:  *** *If you need a refill on your cardiac medications before your next appointment, please call your pharmacy*   Lab Work: *** If you have labs (blood work) drawn today and your tests are completely normal, you will receive your results only by: Marland Kitchen MyChart Message (if you have MyChart) OR . A paper copy in the mail If you have any lab test that is abnormal or we need to change your treatment, we will call you to review the results.   Testing/Procedures: .INST   Follow-Up: At [facility], you and your health needs are our priority.  As part of our continuing mission to provide you with exceptional heart care, we have created designated Provider Care Teams.  These Care Teams include your primary Cardiologist (physician) and Advanced Practice Providers (APPs -  Physician Assistants and Nurse Practitioners) who all work together to provide you with the care you need, when you need it.  We recommend signing up for the patient portal called "MyChart".  Sign up information is provided on this After Visit Summary.  MyChart is used to connect with patients for Virtual Visits (Telemedicine).  Patients are able to view lab/test results, encounter notes, upcoming appointments, etc.  Non-urgent messages can be sent to your provider as well.   To learn more about what you can do with MyChart, go to [email or web address].    Your next appointment:   {numbers 1-12:[id number]} {Time; day/wk/mo/yr(s):9076}  The format for your next appointment:   {F/U Visit Format          :[id number]  Provider:   {Providers/Teams        :[id number]   Other Instructions ***

## 2020-02-29 NOTE — Assessment & Plan Note (Signed)
EF 45-50% at cath and by echo post MI 2020 EF 50-55% by echo Jan 2021

## 2020-02-29 NOTE — Assessment & Plan Note (Signed)
S/P urgent LAD PCI with DES 02/11/17. Residual 60% RCA S/P urgent LAD PCI with DES secondary to ISR, 70% residual RCA, 40% CFX

## 2020-02-29 NOTE — Assessment & Plan Note (Signed)
STEMI March 2018- LAD PCI/DES STEMI July 2020 secondary to LAD ISR- 4 months after Plavix stopped

## 2020-02-29 NOTE — Assessment & Plan Note (Signed)
PE 2014 after achilles tendon repair- Rx'd with Xarelto for 6 months

## 2020-03-01 ENCOUNTER — Encounter (HOSPITAL_COMMUNITY): Payer: Self-pay

## 2020-03-01 NOTE — Telephone Encounter (Signed)
Attempted to call patient in regards to Cardiac Rehab - LM on VM Mailed letter 

## 2020-03-02 ENCOUNTER — Telehealth (HOSPITAL_COMMUNITY): Payer: Self-pay

## 2020-03-02 NOTE — Telephone Encounter (Signed)
Pt returned CR phone call and stated he would like to participate in CR. Patient will come in for orientation on 03/14/2020 @ 9AM and will attend the 845AM exercise class. Went over insurance, patient verbalized understanding.   Mailed letter.

## 2020-03-06 ENCOUNTER — Telehealth (HOSPITAL_COMMUNITY): Payer: Self-pay

## 2020-03-06 NOTE — Telephone Encounter (Signed)
Cardiac Rehab Medication Review by a Pharmacist  Does the patient  feel that his/her medications are working for him/her?  yes  Has the patient been experiencing any side effects to the medications prescribed?  no  Does the patient measure his/her own blood pressure or blood glucose at home?  yes pt has stopped checking BP since Christmas time because it has been so consistent. It typically runs low around 90/60.  Does the patient have any problems obtaining medications due to transportation or finances?   no  Understanding of regimen: excellent Understanding of indications: excellent Potential of compliance: excellent    Pharmacist comments: none    Berenice Bouton, PharmD PGY1 Pharmacy Resident  Please check AMION for all [place] phone numbers After 10:00 PM, call [place] [phone] 03/06/2020 4:13 PM

## 2020-03-14 ENCOUNTER — Other Ambulatory Visit: Payer: Self-pay

## 2020-03-14 ENCOUNTER — Encounter (HOSPITAL_COMMUNITY)
Admission: RE | Admit: 2020-03-14 | Discharge: 2020-03-14 | Disposition: A | Discharge status: Home / Self Care | Payer: Medicare Other | Source: Ambulatory Visit | Attending: Cardiovascular Disease | Admitting: Cardiovascular Disease

## 2020-03-14 ENCOUNTER — Encounter (HOSPITAL_COMMUNITY): Payer: Self-pay

## 2020-03-14 VITALS — BP 100/56 | HR 67 | Temp 97.5°F | Resp 18 | Ht 71.0 in | Wt 205.7 lb

## 2020-03-14 DIAGNOSIS — I2510 Atherosclerotic heart disease of native coronary artery without angina pectoris: Secondary | ICD-10-CM | POA: Insufficient documentation

## 2020-03-14 DIAGNOSIS — I2102 ST elevation (STEMI) myocardial infarction involving left anterior descending coronary artery: Secondary | ICD-10-CM | POA: Insufficient documentation

## 2020-03-14 DIAGNOSIS — Z9861 Coronary angioplasty status: Secondary | ICD-10-CM | POA: Insufficient documentation

## 2020-03-14 NOTE — Progress Notes (Signed)
Cardiac Individual Treatment Plan  Patient Details  Name: John Mccarthy MRN: [id number] Date of Birth: [birth year] Referring Provider:     [place] from 03/14/2020 in [place]  Referring Provider  Lorretta Harp, MD      Initial Encounter Date:    CARDIAC REHAB PHASE II ORIENTATION from 03/14/2020 in [place]  Date  03/14/20      Visit Diagnosis: CAD S/P percutaneous coronary angioplasty  ST elevation myocardial infarction involving left anterior descending (LAD) coronary artery ([place])  Patient's Home Medications on Admission:  Current Outpatient Medications:  .  aspirin EC 81 MG tablet, Take 1 tablet (81 mg total) by mouth daily., Disp: 90 tablet, Rfl: 3 .  atorvastatin (LIPITOR) 80 MG tablet, TAKE 1 TABLET BY MOUTH EVERY DAY AT 6 PM, Disp: 90 tablet, Rfl: 3 .  carvedilol (COREG) 6.25 MG tablet, Take 1 tablet (6.25 mg total) by mouth 2 (two) times daily with a meal., Disp: 180 tablet, Rfl: 3 .  Multiple Vitamins-Minerals (CENTRUM SILVER 50+MEN PO), Take 1 tablet by mouth daily., Disp: , Rfl:  .  nitroGLYCERIN (NITROSTAT) 0.4 MG SL tablet, Place 1 tablet (0.4 mg total) under the tongue every 5 (five) minutes as needed for chest pain., Disp: 25 tablet, Rfl: 3 .  sertraline (ZOLOFT) 50 MG tablet, Take 1 tablet (50 mg total) by mouth daily., Disp: 90 tablet, Rfl: 3 .  ticagrelor (BRILINTA) 90 MG TABS tablet, Take 1 tablet (90 mg total) by mouth 2 (two) times daily., Disp: 60 tablet, Rfl: 11  Past Medical History: Past Medical History:  Diagnosis Date  . BPH (benign prostatic hyperplasia)   . CAD S/P percutaneous coronary angioplasty 2018, 2020   anterior STEMI twice- LAD DES  . GI bleeding 2013  . Hyperlipidemia   . Pulmonary embolism ([place]) 06/15/2013   after achilles tendon repair- Xarelto 6 months  . Skin cancer (melanoma) ([place])     Tobacco Use: Social History   Tobacco Use   Smoking Status Never Smoker  Smokeless Tobacco Never Used    Labs: Recent Review Flowsheet Data    Labs for ITP Cardiac and Pulmonary Rehab Latest Ref Rng & Units 06/16/2017 07/09/2018 06/28/2019 06/29/2019 10/08/2019   Cholestrol 100 - 199 mg/dL 114 122 133 143 105   LDLCALC 0 - 99 mg/dL 52 52 72 93 44   LDLDIRECT mg/dL - - - - -   HDL >39 mg/dL 40(L) 40.00 41 44 43   Trlycerides 0 - 149 mg/dL 111 151.0(H) 99 28 95   Hemoglobin A1c 4.6 - 6.5 % - - - - -   TCO2 0 - 100 mmol/L - - - - -      Capillary Blood Glucose: Lab Results  Component Value Date   GLUCAP 94 05/23/2015   GLUCAP 100 (H) 09/25/2012     Exercise Target Goals: Exercise Program Goal: Individual exercise prescription set using results from initial 6 min walk test and THRR while considering  patient's activity barriers and safety.   Exercise Prescription Goal: Initial exercise prescription builds to 30-45 minutes a day of aerobic activity, 2-3 days per week.  Home exercise guidelines will be given to patient during program as part of exercise prescription that the participant will acknowledge.  Activity Barriers & Risk Stratification: Activity Barriers & Cardiac Risk Stratification - 03/14/20 0913      Activity Barriers & Cardiac Risk Stratification   Activity Barriers  Arthritis    Cardiac Risk Stratification  High       6 Minute Walk: 6 Minute Walk    Row Name 03/14/20 0921         6 Minute Walk   Phase  Initial     Distance  1834 feet     Walk Time  6 minutes     # of Rest Breaks  0     MPH  3.47     METS  3.84     RPE  11     Perceived Dyspnea   0     VO2 Peak  13.45     Symptoms  No     Resting HR  67 bpm     Resting BP  100/56     Resting Oxygen Saturation   98 %     Exercise Oxygen Saturation  during 6 min walk  96 %     Max Ex. HR  105 bpm     Max Ex. BP  128/72     2 Minute Post BP  138/80        Oxygen Initial Assessment:   Oxygen Re-Evaluation:   Oxygen Discharge (Final  Oxygen Re-Evaluation):   Initial Exercise Prescription: Initial Exercise Prescription - 03/14/20 1100      Date of Initial Exercise RX and Referring Provider   Date  03/14/20    Referring Provider  Lorretta Harp, MD    Expected Discharge Date  05/12/20      Bike   Level  3    Minutes  15    METs  3.2      NuStep   Level  4    SPM  85    Minutes  15    METs  3.2      Prescription Details   Frequency (times per week)  3    Duration  Progress to 30 minutes of continuous aerobic without signs/symptoms of physical distress      Intensity   Ratings of Perceived Exertion  11-13    Perceived Dyspnea  0-4      Progression   Progression  Continue to progress workloads to maintain intensity without signs/symptoms of physical distress.      Resistance Training   Training Prescription  Yes    Weight  5    Reps  10-15       Perform Capillary Blood Glucose checks as needed.  Exercise Prescription Changes:   Exercise Comments:   Exercise Goals and Review:  Exercise Goals    Row Name 03/14/20 0913             Exercise Goals   Increase Physical Activity  Yes       Intervention  Provide advice, education, support and counseling about physical activity/exercise needs.;Develop an individualized exercise prescription for aerobic and resistive training based on initial evaluation findings, risk stratification, comorbidities and participant's personal goals.       Expected Outcomes  Short Term: Attend rehab on a regular basis to increase amount of physical activity.;Long Term: Exercising regularly at least 3-5 days a week.;Long Term: Add in home exercise to make exercise part of routine and to increase amount of physical activity.       Increase Strength and Stamina  Yes       Intervention  Provide advice, education, support and counseling about physical activity/exercise needs.;Develop an individualized exercise prescription for aerobic and resistive training based on  initial evaluation  findings, risk stratification, comorbidities and participant's personal goals.       Expected Outcomes  Short Term: Increase workloads from initial exercise prescription for resistance, speed, and METs.;Short Term: Perform resistance training exercises routinely during rehab and add in resistance training at home;Long Term: Improve cardiorespiratory fitness, muscular endurance and strength as measured by increased METs and functional capacity (6MWT)       Able to understand and use rate of perceived exertion (RPE) scale  Yes       Intervention  Provide education and explanation on how to use RPE scale       Expected Outcomes  Short Term: Able to use RPE daily in rehab to express subjective intensity level;Long Term:  Able to use RPE to guide intensity level when exercising independently       Knowledge and understanding of Target Heart Rate Range (THRR)  Yes       Intervention  Provide education and explanation of THRR including how the numbers were predicted and where they are located for reference       Expected Outcomes  Short Term: Able to state/look up THRR;Long Term: Able to use THRR to govern intensity when exercising independently;Short Term: Able to use daily as guideline for intensity in rehab       Able to check pulse independently  Yes       Intervention  Provide education and demonstration on how to check pulse in carotid and radial arteries.;Review the importance of being able to check your own pulse for safety during independent exercise       Expected Outcomes  Short Term: Able to explain why pulse checking is important during independent exercise;Long Term: Able to check pulse independently and accurately       Understanding of Exercise Prescription  Yes       Intervention  Provide education, explanation, and written materials on patient's individual exercise prescription       Expected Outcomes  Short Term: Able to explain program exercise prescription;Long Term:  Able to explain home exercise prescription to exercise independently          Exercise Goals Re-Evaluation :   Discharge Exercise Prescription (Final Exercise Prescription Changes):   Nutrition:  Target Goals: Understanding of nutrition guidelines, daily intake of sodium 1500mg , cholesterol 200mg , calories 30% from fat and 7% or less from saturated fats, daily to have 5 or more servings of fruits and vegetables.  Biometrics: Pre Biometrics - 03/14/20 0932      Pre Biometrics   Waist Circumference  41.5 inches    Hip Circumference  40.5 inches    Waist to Hip Ratio  1.02 %    Triceps Skinfold  6 mm    % Body Fat  24.7 %    Grip Strength  41.5 kg    Flexibility  0 in    Single Leg Stand  2.31 seconds        Nutrition Therapy Plan and Nutrition Goals:   Nutrition Assessments:   Nutrition Goals Re-Evaluation:   Nutrition Goals Re-Evaluation:   Nutrition Goals Discharge (Final Nutrition Goals Re-Evaluation):   Psychosocial: Target Goals: Acknowledge presence or absence of significant depression and/or stress, maximize coping skills, provide positive support system. Participant is able to verbalize types and ability to use techniques and skills needed for reducing stress and depression.  Initial Review & Psychosocial Screening: Initial Psych Review & Screening - 03/14/20 1014      Initial Review   Current issues with  None Identified      Family Dynamics   Good Support System?  Yes    Comments  John Mccarthy has a positive attitude and outlook. He admits to having a strong support system including family and friends. He has adult children and is married. He denies psychosocial barriers to participation in CR at this time. No intervention is needed.      Barriers   Psychosocial barriers to participate in program  There are no identifiable barriers or psychosocial needs.      Screening Interventions   Interventions  Encouraged to exercise       Quality of  Life Scores: Quality of Life - 03/14/20 1120      Quality of Life   Select  Quality of Life      Quality of Life Scores   Health/Function Pre  21.86 %    Socioeconomic Pre  30 %    Psych/Spiritual Pre  26 %    Family Pre  30 %    GLOBAL Pre  25.4 %      Scores of 19 and below usually indicate a poorer quality of life in these areas.  A difference of  2-3 points is a clinically meaningful difference.  A difference of 2-3 points in the total score of the Quality of Life Index has been associated with significant improvement in overall quality of life, self-image, physical symptoms, and general health in studies assessing change in quality of life.  PHQ-9: Recent Review Flowsheet Data    Depression screen [facility] 2/9 03/14/2020 09/16/2019 07/06/2019 07/09/2018 04/14/2017   Decreased Interest 0 0 0 0 0   Down, Depressed, Hopeless 0 0 0 0 0   PHQ - 2 Score 0 0 0 0 0   Altered sleeping 2 3 - - -   Tired, decreased energy 1 3 - - -   Change in appetite 0 1 - - -   Feeling bad or failure about yourself  0 0 - - -   Trouble concentrating 0 0 - - -   Moving slowly or fidgety/restless 0 3 - - -   Suicidal thoughts 0 0 - - -   PHQ-9 Score 3 10 - - -   Difficult doing work/chores Not difficult at all - - - -     Interpretation of Total Score  Total Score Depression Severity:  1-4 = Minimal depression, 5-9 = Mild depression, 10-14 = Moderate depression, 15-19 = Moderately severe depression, 20-27 = Severe depression   Psychosocial Evaluation and Intervention:   Psychosocial Re-Evaluation:   Psychosocial Discharge (Final Psychosocial Re-Evaluation):   Vocational Rehabilitation: Provide vocational rehab assistance to qualifying candidates.   Vocational Rehab Evaluation & Intervention:   Education: Education Goals: Education classes will be provided on a weekly basis, covering required topics. Participant will state understanding/return demonstration of topics presented.  Learning  Barriers/Preferences:   Education Topics: Count Your Pulse:  -Group instruction provided by verbal instruction, demonstration, patient participation and written materials to support subject.  Instructors address importance of being able to find your pulse and how to count your pulse when at home without a heart monitor.  Patients get hands on experience counting their pulse with staff help and individually.   CARDIAC REHAB PHASE II EXERCISE from 05/21/2017 in [place]  Date  05/02/17  Instruction Review Code (Retired)  2- meets goals/outcomes      Heart Attack, Angina, and Risk Factor Modification:  -Group instruction provided  by verbal instruction, video, and written materials to support subject.  Instructors address signs and symptoms of angina and heart attacks.    Also discuss risk factors for heart disease and how to make changes to improve heart health risk factors.   Functional Fitness:  -Group instruction provided by verbal instruction, demonstration, patient participation, and written materials to support subject.  Instructors address safety measures for doing things around the house.  Discuss how to get up and down off the floor, how to pick things up properly, how to safely get out of a chair without assistance, and balance training.   Meditation and Mindfulness:  -Group instruction provided by verbal instruction, patient participation, and written materials to support subject.  Instructor addresses importance of mindfulness and meditation practice to help reduce stress and improve awareness.  Instructor also leads participants through a meditation exercise.    Stretching for Flexibility and Mobility:  -Group instruction provided by verbal instruction, patient participation, and written materials to support subject.  Instructors lead participants through series of stretches that are designed to increase flexibility thus improving mobility.  These  stretches are additional exercise for major muscle groups that are typically performed during regular warm up and cool down.   Hands Only CPR:  -Group verbal, video, and participation provides a basic overview of AHA guidelines for community CPR. Role-play of emergencies allow participants the opportunity to practice calling for help and chest compression technique with discussion of AED use.   Hypertension: -Group verbal and written instruction that provides a basic overview of hypertension including the most recent diagnostic guidelines, risk factor reduction with self-care instructions and medication management.    Nutrition I class: Heart Healthy Eating:  -Group instruction provided by PowerPoint slides, verbal discussion, and written materials to support subject matter. The instructor gives an explanation and review of the Therapeutic Lifestyle Changes diet recommendations, which includes a discussion on lipid goals, dietary fat, sodium, fiber, plant stanol/sterol esters, sugar, and the components of a well-balanced, healthy diet.   CARDIAC REHAB PHASE II EXERCISE from 05/21/2017 in [place]  Date  05/06/17  Educator  RD  Instruction Review Code (Retired)  2- meets goals/outcomes      Nutrition II class: Lifestyle Skills:  -Group instruction provided by [employer], verbal discussion, and written materials to support subject matter. The instructor gives an explanation and review of label reading, grocery shopping for heart health, heart healthy recipe modifications, and ways to make healthier choices when eating out.   CARDIAC REHAB PHASE II EXERCISE from 05/21/2017 in [place]  Date  05/13/17  Educator  RD  Instruction Review Code (Retired)  2- meets goals/outcomes      Diabetes Question & Answer:  -Group instruction provided by PowerPoint slides, verbal discussion, and written materials to support subject  matter. The instructor gives an explanation and review of diabetes co-morbidities, pre- and post-prandial blood glucose goals, pre-exercise blood glucose goals, signs, symptoms, and treatment of hypoglycemia and hyperglycemia, and foot care basics.   Diabetes Blitz:  -Group instruction provided by PowerPoint slides, verbal discussion, and written materials to support subject matter. The instructor gives an explanation and review of the physiology behind type 1 and type 2 diabetes, diabetes medications and rational behind using different medications, pre- and post-prandial blood glucose recommendations and Hemoglobin A1c goals, diabetes diet, and exercise including blood glucose guidelines for exercising safely.    Portion Distortion:  -Group instruction provided by PowerPoint slides,  verbal discussion, written materials, and food models to support subject matter. The instructor gives an explanation of serving size versus portion size, changes in portions sizes over the last 20 years, and what consists of a serving from each food group.   CARDIAC REHAB PHASE II EXERCISE from 05/21/2017 in [place]  Date  05/21/17  Educator  RD  Instruction Review Code (Retired)  2- meets goals/outcomes      Stress Management:  -Group instruction provided by verbal instruction, video, and written materials to support subject matter.  Instructors review role of stress in heart disease and how to cope with stress positively.     Exercising on Your Own:  -Group instruction provided by verbal instruction, power point, and written materials to support subject.  Instructors discuss benefits of exercise, components of exercise, frequency and intensity of exercise, and end points for exercise.  Also discuss use of nitroglycerin and activating EMS.  Review options of places to exercise outside of rehab.  Review guidelines for sex with heart disease.   Cardiac Drugs I:  -Group  instruction provided by verbal instruction and written materials to support subject.  Instructor reviews cardiac drug classes: antiplatelets, anticoagulants, beta blockers, and statins.  Instructor discusses reasons, side effects, and lifestyle considerations for each drug class.   Cardiac Drugs II:  -Group instruction provided by verbal instruction and written materials to support subject.  Instructor reviews cardiac drug classes: angiotensin converting enzyme inhibitors (ACE-I), angiotensin II receptor blockers (ARBs), nitrates, and calcium channel blockers.  Instructor discusses reasons, side effects, and lifestyle considerations for each drug class.   Anatomy and Physiology of the Circulatory System:  Group verbal and written instruction and models provide basic cardiac anatomy and physiology, with the coronary electrical and arterial systems. Review of: AMI, Angina, Valve disease, Heart Failure, Peripheral Artery Disease, Cardiac Arrhythmia, Pacemakers, and the ICD.   CARDIAC REHAB PHASE II EXERCISE from 05/21/2017 in [place]  Date  04/30/17  Instruction Review Code (Retired)  2- meets goals/outcomes      Other Education:  -Group or individual verbal, written, or video instructions that support the educational goals of the cardiac rehab program.   Holiday Eating Survival Tips:  -Group instruction provided by PowerPoint slides, verbal discussion, and written materials to support subject matter. The instructor gives patients tips, tricks, and techniques to help them not only survive but enjoy the holidays despite the onslaught of food that accompanies the holidays.   Knowledge Questionnaire Score: Knowledge Questionnaire Score - 03/14/20 1120      Knowledge Questionnaire Score   Pre Score  24/24       Core Components/Risk Factors/Patient Goals at Admission: Personal Goals and Risk Factors at Admission - 03/14/20 0913      Core Components/Risk  Factors/Patient Goals on Admission   Lipids  Yes    Intervention  Provide education and support for participant on nutrition & aerobic/resistive exercise along with prescribed medications to achieve LDL 70mg , HDL >40mg .    Expected Outcomes  Short Term: Participant states understanding of desired cholesterol values and is compliant with medications prescribed. Participant is following exercise prescription and nutrition guidelines.;Long Term: Cholesterol controlled with medications as prescribed, with individualized exercise RX and with personalized nutrition plan. Value goals: LDL < 70mg , HDL > 40 mg.       Core Components/Risk Factors/Patient Goals Review:    Core Components/Risk Factors/Patient Goals at Discharge (Final Review):    ITP Comments: ITP  Comments    Row Name 03/14/20 0931           ITP Comments  Dr. Fransico Him, Medical Director Cardiac Rehab [facility]          Comments: Patient attended orientation on 03/14/2020 to review rules and guidelines for program.  Completed 6 minute walk test, Intitial ITP, and exercise prescription. VSS. Telemetry-NSR.  Asymptomatic. Safety measures and social distancing in place per CDC guidelines. Patient declined utilization and enrollment in the [facility] program as a hybrid form of rehab.

## 2020-03-20 ENCOUNTER — Other Ambulatory Visit: Payer: Self-pay

## 2020-03-20 ENCOUNTER — Encounter (HOSPITAL_COMMUNITY)
Admission: RE | Admit: 2020-03-20 | Discharge: 2020-03-20 | Disposition: A | Discharge status: Home / Self Care | Payer: Medicare Other | Source: Ambulatory Visit | Attending: Cardiovascular Disease | Admitting: Cardiovascular Disease

## 2020-03-20 DIAGNOSIS — I2102 ST elevation (STEMI) myocardial infarction involving left anterior descending coronary artery: Secondary | ICD-10-CM

## 2020-03-20 DIAGNOSIS — I2510 Atherosclerotic heart disease of native coronary artery without angina pectoris: Secondary | ICD-10-CM | POA: Diagnosis not present

## 2020-03-20 DIAGNOSIS — Z9861 Coronary angioplasty status: Secondary | ICD-10-CM | POA: Diagnosis not present

## 2020-03-20 NOTE — Progress Notes (Signed)
Daily Session Note  Patient Details  Name: John Mccarthy MRN: [id number] Date of Birth: 08/09/1951 Referring Provider:     [place] from 03/14/2020 in [place]  Referring Provider  Lorretta Harp, MD      Encounter Date: 03/20/2020  Check In: Session Check In - 03/20/20 0856      Check-In   Supervising physician immediately available to respond to emergencies  [facility] immediately available    Physician(s)  Dr. Tawanna Solo    Location  [facility]    Staff Present  Trish Fountain, RN, Marga Melnick, RN, Deland Pretty, MS, ACSM CEP, Exercise Physiologist;Tyara Carol Ada, MS,ACSM CEP, Exercise Physiologist;Dalton Kris Mouton, MS, Exercise Physiologist    Virtual Visit  No    Medication changes reported      No    Fall or balance concerns reported     No    Tobacco Cessation  No Change    Warm-up and Cool-down  Performed on first and last piece of equipment    Resistance Training Performed  Yes    VAD Patient?  No    PAD/SET Patient?  No      Pain Assessment   Currently in Pain?  No/denies    Multiple Pain Sites  No       Capillary Blood Glucose: No results found for this or any previous visit (from the past 24 hour(s)).  Exercise Prescription Changes - 03/20/20 1000      Response to Exercise   Blood Pressure (Admit)  98/68    Blood Pressure (Exercise)  108/70    Blood Pressure (Exit)  102/60    Heart Rate (Admit)  57 bpm    Heart Rate (Exercise)  83 bpm    Heart Rate (Exit)  67 bpm    Rating of Perceived Exertion (Exercise)  11    Perceived Dyspnea (Exercise)  0    Symptoms  None    Comments  Pt's first day of exercise    Duration  Progress to 30 minutes of  aerobic without signs/symptoms of physical distress    Intensity  THRR unchanged      Progression   Progression  Continue to progress workloads to maintain intensity without signs/symptoms of physical distress.    Average METs  3.5      Resistance Training   Training Prescription  Yes    Weight  5lbs    Reps  10-15    Time  10 Minutes      Interval Training   Interval Training  No      Bike   Level  4    Minutes  15    METs  4.2      NuStep   Level  4    SPM  85    Minutes  15    METs  2.8       Social History   Tobacco Use  Smoking Status Never Smoker  Smokeless Tobacco Never Used    Goals Met:  Exercise tolerated well No report of cardiac concerns or symptoms Strength training completed today  Goals Unmet:  Not Applicable  Comments: Pt started cardiac rehab today.  Pt tolerated light exercise without difficulty. VSS, telemetry-Sinus Rhythm, asymptomatic.  Medication list reconciled. Pt denies barriers to medicaiton compliance.  PSYCHOSOCIAL ASSESSMENT:  PHQ-0. Pt exhibits positive coping skills, hopeful outlook with supportive family. No psychosocial needs identified at this time, no psychosocial interventions necessary.  Pt enjoys playing the stock market and [employer].   Pt oriented to exercise equipment and routine.    Understanding verbalized.[name], RN,BSN 03/20/2020 4:59 PM   Dr. Fransico Him is Medical Director for Cardiac Rehab at [facility].

## 2020-03-22 ENCOUNTER — Encounter (HOSPITAL_COMMUNITY)
Admission: RE | Admit: 2020-03-22 | Discharge: 2020-03-22 | Disposition: A | Discharge status: Home / Self Care | Payer: Medicare Other | Source: Ambulatory Visit | Attending: Cardiovascular Disease | Admitting: Cardiovascular Disease

## 2020-03-22 ENCOUNTER — Other Ambulatory Visit: Payer: Self-pay

## 2020-03-22 VITALS — Ht 71.0 in | Wt 205.0 lb

## 2020-03-22 DIAGNOSIS — I2510 Atherosclerotic heart disease of native coronary artery without angina pectoris: Secondary | ICD-10-CM

## 2020-03-22 DIAGNOSIS — I2102 ST elevation (STEMI) myocardial infarction involving left anterior descending coronary artery: Secondary | ICD-10-CM | POA: Diagnosis not present

## 2020-03-22 DIAGNOSIS — Z9861 Coronary angioplasty status: Secondary | ICD-10-CM | POA: Diagnosis not present

## 2020-03-22 NOTE — Progress Notes (Signed)
John Mccarthy 69 y.o. male Nutrition Note  Visit Diagnosis: CAD S/P percutaneous coronary angioplasty  ST elevation myocardial infarction involving left anterior descending (LAD) coronary artery [facility])   Past Medical History:  Diagnosis Date  . BPH (benign prostatic hyperplasia)   . CAD S/P percutaneous coronary angioplasty 2018, 2020   anterior STEMI twice- LAD DES  . GI bleeding 2013  . Hyperlipidemia   . Pulmonary embolism ([place]) 06/15/2013   after achilles tendon repair- Xarelto 6 months  . Skin cancer (melanoma) ([place])      Medications reviewed.   Current Outpatient Medications:  .  aspirin EC 81 MG tablet, Take 1 tablet (81 mg total) by mouth daily., Disp: 90 tablet, Rfl: 3 .  atorvastatin (LIPITOR) 80 MG tablet, TAKE 1 TABLET BY MOUTH EVERY DAY AT 6 PM, Disp: 90 tablet, Rfl: 3 .  carvedilol (COREG) 6.25 MG tablet, Take 1 tablet (6.25 mg total) by mouth 2 (two) times daily with a meal., Disp: 180 tablet, Rfl: 3 .  Multiple Vitamins-Minerals (CENTRUM SILVER 50+MEN PO), Take 1 tablet by mouth daily., Disp: , Rfl:  .  nitroGLYCERIN (NITROSTAT) 0.4 MG SL tablet, Place 1 tablet (0.4 mg total) under the tongue every 5 (five) minutes as needed for chest pain., Disp: 25 tablet, Rfl: 3 .  sertraline (ZOLOFT) 50 MG tablet, Take 1 tablet (50 mg total) by mouth daily., Disp: 90 tablet, Rfl: 3 .  ticagrelor (BRILINTA) 90 MG TABS tablet, Take 1 tablet (90 mg total) by mouth 2 (two) times daily., Disp: 60 tablet, Rfl: 11   Ht Readings from Last 1 Encounters:  03/14/20 5\' 11"  (1.803 m)     Wt Readings from Last 3 Encounters:  03/14/20 205 lb 11 oz (93.3 kg)  02/29/20 203 lb (92.1 kg)  10/08/19 199 lb (90.3 kg)     There is no height or weight on file to calculate BMI.   Social History   Tobacco Use  Smoking Status Never Smoker  Smokeless Tobacco Never Used     Lab Results  Component Value Date   CHOL 105 10/08/2019   Lab Results  Component Value Date   HDL 43  10/08/2019   Lab Results  Component Value Date   LDLCALC 44 10/08/2019   Lab Results  Component Value Date   TRIG 95 10/08/2019     Lab Results  Component Value Date   HGBA1C 5.5 10/15/2016     CBG (last 3)  No results for input(s): GLUCAP in the last 72 hours.   Nutrition Note  Spoke with pt. Nutrition Plan and Nutrition Survey goals reviewed with pt. Pt is trying to follow a Heart Healthy diet.  Pt has been logging his food into the [employer]. He feels this has given him good information about his diet. He requests recommendations for heart healthy diet. Provided him with these. Reviewing food log, he is eating very little fiber, excessive added sugar due to red bull, and too much saturated fats. We discussed ways to modify. Pt expressed understanding of the information reviewed.  Nutrition Diagnosis ? Inadequate fiber intake related to lack of previous education about dietary fiber as evidenced by estimated daily fiber intake of 11 g.  Nutrition Intervention ? Pt's individual nutrition plan reviewed with pt. ? Benefits of adopting Heart Healthy diet discussed when [employer] reviewed.   ? Continue client-centered nutrition education by RD, as part of interdisciplinary care.  Goal(s) ? Pt to identify and limit food sources of saturated fat,  trans fat, refined carbohydrates and sodium ? Pt to build a healthy plate including vegetables, fruits, whole grains, and low-fat dairy products in a heart healthy meal plan.  Plan:   Will provide client-centered nutrition education as part of interdisciplinary care  Monitor and evaluate progress toward nutrition goal with team.   Michaele Offer, MS, RDN, LDN

## 2020-03-24 ENCOUNTER — Encounter (HOSPITAL_COMMUNITY)
Admission: RE | Admit: 2020-03-24 | Discharge: 2020-03-24 | Disposition: A | Discharge status: Home / Self Care | Payer: Medicare Other | Source: Ambulatory Visit | Attending: Cardiovascular Disease | Admitting: Cardiovascular Disease

## 2020-03-24 ENCOUNTER — Other Ambulatory Visit: Payer: Self-pay

## 2020-03-24 DIAGNOSIS — I2102 ST elevation (STEMI) myocardial infarction involving left anterior descending coronary artery: Secondary | ICD-10-CM | POA: Diagnosis not present

## 2020-03-24 DIAGNOSIS — I2510 Atherosclerotic heart disease of native coronary artery without angina pectoris: Secondary | ICD-10-CM

## 2020-03-24 DIAGNOSIS — Z9861 Coronary angioplasty status: Secondary | ICD-10-CM | POA: Diagnosis not present

## 2020-03-27 ENCOUNTER — Other Ambulatory Visit: Payer: Self-pay

## 2020-03-27 ENCOUNTER — Telehealth: Payer: Self-pay | Admitting: Cardiovascular Disease

## 2020-03-27 ENCOUNTER — Encounter (HOSPITAL_COMMUNITY)
Admission: RE | Admit: 2020-03-27 | Discharge: 2020-03-27 | Disposition: A | Discharge status: Home / Self Care | Payer: Medicare Other | Source: Ambulatory Visit | Attending: Cardiovascular Disease | Admitting: Cardiovascular Disease

## 2020-03-27 DIAGNOSIS — Z9861 Coronary angioplasty status: Secondary | ICD-10-CM | POA: Diagnosis not present

## 2020-03-27 DIAGNOSIS — I2510 Atherosclerotic heart disease of native coronary artery without angina pectoris: Secondary | ICD-10-CM

## 2020-03-27 DIAGNOSIS — I2102 ST elevation (STEMI) myocardial infarction involving left anterior descending coronary artery: Secondary | ICD-10-CM | POA: Diagnosis not present

## 2020-03-27 NOTE — Telephone Encounter (Signed)
Call received from [facility] this morning about the patient. She stated that the patient has been having some low blood pressure readings. At rest, the patient's heart rate has been running [id number] systolic with heart rates in the 50-60's. He is asymptomatic.   Blood pressure and heart rate this morning was 96/52  and 65.  The patient has been drinking a red bull in the mornings to increase his pressure and heart rates.  He is currently taking: Carvedilol 6.25 mg bid.   Verdis Frederickson has faxed over the progress notes for Dr. Kennon Holter recommendations.

## 2020-03-27 NOTE — Progress Notes (Signed)
John Mccarthy's systolic  blood pressures at cardiac rehab have been in the 90's to low 100's. John Mccarthy is doing well with exercise at cardiac rehab but reports that he has to drink a red bull every day to keep his heart rate up. John Mccarthy is checking his blood pressures at home and says his systolic blood has been in the upper 80's. Blood pressure this morning at cardiac rehab. 96/52 heart rate 65. Patient encouraged to drink water today at cardiac rehab and is asymptomatic. Will fax exercise flow sheets to Dr. Kennon Holter office for review. Spoke with Lattie Haw about [name] BP's. Will continue to monitor the patient throughout  the program.Jacque Byron Venetia Maxon, RN,BSN 03/27/2020 9:13 AM

## 2020-03-27 NOTE — Telephone Encounter (Signed)
Would continue current meds if pt is assymptomatic

## 2020-03-28 NOTE — Telephone Encounter (Signed)
The patient has been made aware. If he starts to become symptomatic then he should call the office, otherwise keep his appointment on 04/11/20 with Dr. Gwenlyn Found.

## 2020-03-29 ENCOUNTER — Encounter (HOSPITAL_COMMUNITY)
Admission: RE | Admit: 2020-03-29 | Discharge: 2020-03-29 | Disposition: A | Discharge status: Home / Self Care | Payer: Medicare Other | Source: Ambulatory Visit | Attending: Cardiovascular Disease | Admitting: Cardiovascular Disease

## 2020-03-29 ENCOUNTER — Other Ambulatory Visit: Payer: Self-pay

## 2020-03-29 DIAGNOSIS — I2102 ST elevation (STEMI) myocardial infarction involving left anterior descending coronary artery: Secondary | ICD-10-CM

## 2020-03-29 DIAGNOSIS — Z9861 Coronary angioplasty status: Secondary | ICD-10-CM

## 2020-03-29 DIAGNOSIS — I2510 Atherosclerotic heart disease of native coronary artery without angina pectoris: Secondary | ICD-10-CM | POA: Diagnosis not present

## 2020-03-31 ENCOUNTER — Encounter (HOSPITAL_COMMUNITY)
Admission: RE | Admit: 2020-03-31 | Discharge: 2020-03-31 | Disposition: A | Discharge status: Home / Self Care | Payer: Medicare Other | Source: Ambulatory Visit | Attending: Cardiovascular Disease | Admitting: Cardiovascular Disease

## 2020-03-31 ENCOUNTER — Other Ambulatory Visit: Payer: Self-pay

## 2020-03-31 DIAGNOSIS — Z9861 Coronary angioplasty status: Secondary | ICD-10-CM

## 2020-03-31 DIAGNOSIS — I2510 Atherosclerotic heart disease of native coronary artery without angina pectoris: Secondary | ICD-10-CM

## 2020-03-31 DIAGNOSIS — I2102 ST elevation (STEMI) myocardial infarction involving left anterior descending coronary artery: Secondary | ICD-10-CM | POA: Diagnosis not present

## 2020-04-03 ENCOUNTER — Other Ambulatory Visit: Payer: Self-pay

## 2020-04-03 ENCOUNTER — Encounter (HOSPITAL_COMMUNITY)
Admission: RE | Admit: 2020-04-03 | Discharge: 2020-04-03 | Disposition: A | Discharge status: Home / Self Care | Payer: Medicare Other | Source: Ambulatory Visit | Attending: Cardiovascular Disease | Admitting: Cardiovascular Disease

## 2020-04-03 VITALS — Ht 71.0 in | Wt 205.0 lb

## 2020-04-03 DIAGNOSIS — I2510 Atherosclerotic heart disease of native coronary artery without angina pectoris: Secondary | ICD-10-CM | POA: Insufficient documentation

## 2020-04-03 DIAGNOSIS — I252 Old myocardial infarction: Secondary | ICD-10-CM | POA: Diagnosis not present

## 2020-04-03 DIAGNOSIS — Z955 Presence of coronary angioplasty implant and graft: Secondary | ICD-10-CM | POA: Diagnosis not present

## 2020-04-03 DIAGNOSIS — I2102 ST elevation (STEMI) myocardial infarction involving left anterior descending coronary artery: Secondary | ICD-10-CM

## 2020-04-03 DIAGNOSIS — Z85820 Personal history of malignant melanoma of skin: Secondary | ICD-10-CM | POA: Insufficient documentation

## 2020-04-03 DIAGNOSIS — Z86711 Personal history of pulmonary embolism: Secondary | ICD-10-CM | POA: Insufficient documentation

## 2020-04-03 DIAGNOSIS — Z7902 Long term (current) use of antithrombotics/antiplatelets: Secondary | ICD-10-CM | POA: Insufficient documentation

## 2020-04-03 DIAGNOSIS — E785 Hyperlipidemia, unspecified: Secondary | ICD-10-CM | POA: Insufficient documentation

## 2020-04-03 DIAGNOSIS — Z7982 Long term (current) use of aspirin: Secondary | ICD-10-CM | POA: Diagnosis not present

## 2020-04-03 DIAGNOSIS — Z79899 Other long term (current) drug therapy: Secondary | ICD-10-CM | POA: Diagnosis not present

## 2020-04-04 NOTE — Progress Notes (Signed)
Cardiac Individual Treatment Plan  Patient Details  Name: John Mccarthy MRN: [id number] Date of Birth: 1951-11-03 Referring Provider:     [place] from 03/14/2020 in [facility]  Referring Provider  Lorretta Harp, MD      Initial Encounter Date:    CARDIAC REHAB PHASE II ORIENTATION from 03/14/2020 in [facility]  Date  03/14/20      Visit Diagnosis: ST elevation myocardial infarction involving left anterior descending (LAD) coronary artery ([place])  CAD S/P percutaneous coronary angioplasty  Patient's Home Medications on Admission:  Current Outpatient Medications:  .  aspirin EC 81 MG tablet, Take 1 tablet (81 mg total) by mouth daily., Disp: 90 tablet, Rfl: 3 .  atorvastatin (LIPITOR) 80 MG tablet, TAKE 1 TABLET BY MOUTH EVERY DAY AT 6 PM, Disp: 90 tablet, Rfl: 3 .  carvedilol (COREG) 6.25 MG tablet, Take 1 tablet (6.25 mg total) by mouth 2 (two) times daily with a meal., Disp: 180 tablet, Rfl: 3 .  Multiple Vitamins-Minerals (CENTRUM SILVER 50+MEN PO), Take 1 tablet by mouth daily., Disp: , Rfl:  .  nitroGLYCERIN (NITROSTAT) 0.4 MG SL tablet, Place 1 tablet (0.4 mg total) under the tongue every 5 (five) minutes as needed for chest pain., Disp: 25 tablet, Rfl: 3 .  sertraline (ZOLOFT) 50 MG tablet, Take 1 tablet (50 mg total) by mouth daily., Disp: 90 tablet, Rfl: 3 .  ticagrelor (BRILINTA) 90 MG TABS tablet, Take 1 tablet (90 mg total) by mouth 2 (two) times daily., Disp: 60 tablet, Rfl: 11  Past Medical History: Past Medical History:  Diagnosis Date  . BPH (benign prostatic hyperplasia)   . CAD S/P percutaneous coronary angioplasty 2018, 2020   anterior STEMI twice- LAD DES  . GI bleeding 2013  . Hyperlipidemia   . Pulmonary embolism ([place]) 06/15/2013   after achilles tendon repair- Xarelto 6 months  . Skin cancer (melanoma) ([place])     Tobacco Use: Social History   Tobacco Use   Smoking Status Never Smoker  Smokeless Tobacco Never Used    Labs: Recent Review Flowsheet Data    Labs for ITP Cardiac and Pulmonary Rehab Latest Ref Rng & Units 06/16/2017 07/09/2018 06/28/2019 06/29/2019 10/08/2019   Cholestrol 100 - 199 mg/dL 114 122 133 143 105   LDLCALC 0 - 99 mg/dL 52 52 72 93 44   LDLDIRECT mg/dL - - - - -   HDL >39 mg/dL 40(L) 40.00 41 44 43   Trlycerides 0 - 149 mg/dL 111 151.0(H) 99 28 95   Hemoglobin A1c 4.6 - 6.5 % - - - - -   TCO2 0 - 100 mmol/L - - - - -      Capillary Blood Glucose: Lab Results  Component Value Date   GLUCAP 94 05/23/2015   GLUCAP 100 (H) 09/25/2012     Exercise Target Goals: Exercise Program Goal: Individual exercise prescription set using results from initial 6 min walk test and THRR while considering  patient's activity barriers and safety.   Exercise Prescription Goal: Starting with aerobic activity 30 plus minutes a day, 3 days per week for initial exercise prescription. Provide home exercise prescription and guidelines that participant acknowledges understanding prior to discharge.  Activity Barriers & Risk Stratification: Activity Barriers & Cardiac Risk Stratification - 03/14/20 0913      Activity Barriers & Cardiac Risk Stratification   Activity Barriers  Arthritis    Cardiac Risk Stratification  High       6 Minute Walk: 6 Minute Walk    Row Name 03/14/20 0921         6 Minute Walk   Phase  Initial     Distance  1834 feet     Walk Time  6 minutes     # of Rest Breaks  0     MPH  3.47     METS  3.84     RPE  11     Perceived Dyspnea   0     VO2 Peak  13.45     Symptoms  No     Resting HR  67 bpm     Resting BP  100/56     Resting Oxygen Saturation   98 %     Exercise Oxygen Saturation  during 6 min walk  96 %     Max Ex. HR  105 bpm     Max Ex. BP  128/72     2 Minute Post BP  138/80        Oxygen Initial Assessment:   Oxygen Re-Evaluation:   Oxygen Discharge (Final Oxygen  Re-Evaluation):   Initial Exercise Prescription: Initial Exercise Prescription - 03/14/20 1100      Date of Initial Exercise RX and Referring Provider   Date  03/14/20    Referring Provider  Lorretta Harp, MD    Expected Discharge Date  05/12/20      Bike   Level  3    Minutes  15    METs  3.2      NuStep   Level  4    SPM  85    Minutes  15    METs  3.2      Prescription Details   Frequency (times per week)  3    Duration  Progress to 30 minutes of continuous aerobic without signs/symptoms of physical distress      Intensity   Ratings of Perceived Exertion  11-13    Perceived Dyspnea  0-4      Progression   Progression  Continue to progress workloads to maintain intensity without signs/symptoms of physical distress.      Resistance Training   Training Prescription  Yes    Weight  5    Reps  10-15       Perform Capillary Blood Glucose checks as needed.  Exercise Prescription Changes:  Exercise Prescription Changes    Row Name 03/20/20 1000 04/05/20 0900           Response to Exercise   Blood Pressure (Admit)  98/68  96/60      Blood Pressure (Exercise)  108/70  104/72      Blood Pressure (Exit)  102/60  98/60      Heart Rate (Admit)  57 bpm  69 bpm      Heart Rate (Exercise)  83 bpm  90 bpm      Heart Rate (Exit)  67 bpm  65 bpm      Rating of Perceived Exertion (Exercise)  11  12      Perceived Dyspnea (Exercise)  0  0      Symptoms  None  None      Comments  Pt's first day of exercise  Pt tolerated exercise well      Duration  Progress to 30 minutes of  aerobic without signs/symptoms of physical distress  Continue with 30 min of aerobic exercise without  signs/symptoms of physical distress.      Intensity  THRR unchanged  THRR unchanged        Progression   Progression  Continue to progress workloads to maintain intensity without signs/symptoms of physical distress.  Continue to progress workloads to maintain intensity without signs/symptoms of  physical distress.      Average METs  3.5  4.7        Resistance Training   Training Prescription  Yes  No      Weight  5lbs  -      Reps  10-15  -      Time  10 Minutes  -        Interval Training   Interval Training  No  -        Bike   Level  4  5      Minutes  15  15      METs  4.2  5.6        NuStep   Level  4  5      SPM  85  105      Minutes  15  15      METs  2.8  3.5         Exercise Comments:  Exercise Comments    Row Name 03/20/20 1027 04/06/20 0847         Exercise Comments  Pt's first day of exercise. Pt responded very well to exercise prescription. Pt did seem bored with workloads. Increased pt's level on bike from 3 to 4. Will continue to monitor pt and increase workloads as tolerated.  Pt is continuing to respond well to exercise prescription. Will follow up with home exercise plan and review plan to progress pt to the elliptical.         Exercise Goals and Review:  Exercise Goals    Row Name 03/14/20 0913             Exercise Goals   Increase Physical Activity  Yes       Intervention  Provide advice, education, support and counseling about physical activity/exercise needs.;Develop an individualized exercise prescription for aerobic and resistive training based on initial evaluation findings, risk stratification, comorbidities and participant's personal goals.       Expected Outcomes  Short Term: Attend rehab on a regular basis to increase amount of physical activity.;Long Term: Exercising regularly at least 3-5 days a week.;Long Term: Add in home exercise to make exercise part of routine and to increase amount of physical activity.       Increase Strength and Stamina  Yes       Intervention  Provide advice, education, support and counseling about physical activity/exercise needs.;Develop an individualized exercise prescription for aerobic and resistive training based on initial evaluation findings, risk stratification, comorbidities and participant's  personal goals.       Expected Outcomes  Short Term: Increase workloads from initial exercise prescription for resistance, speed, and METs.;Short Term: Perform resistance training exercises routinely during rehab and add in resistance training at home;Long Term: Improve cardiorespiratory fitness, muscular endurance and strength as measured by increased METs and functional capacity (6MWT)       Able to understand and use rate of perceived exertion (RPE) scale  Yes       Intervention  Provide education and explanation on how to use RPE scale       Expected Outcomes  Short Term: Able to use RPE daily in rehab to  express subjective intensity level;Long Term:  Able to use RPE to guide intensity level when exercising independently       Knowledge and understanding of Target Heart Rate Range (THRR)  Yes       Intervention  Provide education and explanation of THRR including how the numbers were predicted and where they are located for reference       Expected Outcomes  Short Term: Able to state/look up THRR;Long Term: Able to use THRR to govern intensity when exercising independently;Short Term: Able to use daily as guideline for intensity in rehab       Able to check pulse independently  Yes       Intervention  Provide education and demonstration on how to check pulse in carotid and radial arteries.;Review the importance of being able to check your own pulse for safety during independent exercise       Expected Outcomes  Short Term: Able to explain why pulse checking is important during independent exercise;Long Term: Able to check pulse independently and accurately       Understanding of Exercise Prescription  Yes       Intervention  Provide education, explanation, and written materials on patient's individual exercise prescription       Expected Outcomes  Short Term: Able to explain program exercise prescription;Long Term: Able to explain home exercise prescription to exercise independently           Exercise Goals Re-Evaluation :    Discharge Exercise Prescription (Final Exercise Prescription Changes): Exercise Prescription Changes - 04/05/20 0900      Response to Exercise   Blood Pressure (Admit)  96/60    Blood Pressure (Exercise)  104/72    Blood Pressure (Exit)  98/60    Heart Rate (Admit)  69 bpm    Heart Rate (Exercise)  90 bpm    Heart Rate (Exit)  65 bpm    Rating of Perceived Exertion (Exercise)  12    Perceived Dyspnea (Exercise)  0    Symptoms  None    Comments  Pt tolerated exercise well    Duration  Continue with 30 min of aerobic exercise without signs/symptoms of physical distress.    Intensity  THRR unchanged      Progression   Progression  Continue to progress workloads to maintain intensity without signs/symptoms of physical distress.    Average METs  4.7      Resistance Training   Training Prescription  No      Bike   Level  5    Minutes  15    METs  5.6      NuStep   Level  5    SPM  105    Minutes  15    METs  3.5       Nutrition:  Target Goals: Understanding of nutrition guidelines, daily intake of sodium 1500mg , cholesterol 200mg , calories 30% from fat and 7% or less from saturated fats, daily to have 5 or more servings of fruits and vegetables.  Biometrics: Pre Biometrics - 03/14/20 0932      Pre Biometrics   Waist Circumference  41.5 inches    Hip Circumference  40.5 inches    Waist to Hip Ratio  1.02 %    Triceps Skinfold  6 mm    % Body Fat  24.7 %    Grip Strength  41.5 kg    Flexibility  0 in    Single Leg Stand  2.31 seconds  Nutrition Therapy Plan and Nutrition Goals: Nutrition Therapy & Goals - 03/22/20 1049      Nutrition Therapy   Diet  Heart Healthy      Personal Nutrition Goals   Nutrition Goal  Pt to identify and limit food sources of saturated fat, trans fat, refined carbohydrates and sodium    Personal Goal #2  Pt to build a healthy plate including vegetables, fruits, whole grains, and  low-fat dairy products in a heart healthy meal plan.      Intervention Plan   Intervention  Prescribe, educate and counsel regarding individualized specific dietary modifications aiming towards targeted core components such as weight, hypertension, lipid management, diabetes, heart failure and other comorbidities.;Nutrition handout(s) given to patient.    Expected Outcomes  Short Term Goal: Understand basic principles of dietary content, such as calories, fat, sodium, cholesterol and nutrients.       Nutrition Assessments: Nutrition Assessments - 03/23/20 1130      MEDFICTS Scores   Pre Score  35       Nutrition Goals Re-Evaluation: Nutrition Goals Re-Evaluation    Row Name 03/22/20 1050             Goals   Current Weight  205 lb (93 kg)       Nutrition Goal  Pt to identify and limit food sources of saturated fat, trans fat, refined carbohydrates and sodium         Personal Goal #2 Re-Evaluation   Personal Goal #2  Pt to build a healthy plate including vegetables, fruits, whole grains, and low-fat dairy products in a heart healthy meal plan.          Nutrition Goals Discharge (Final Nutrition Goals Re-Evaluation): Nutrition Goals Re-Evaluation - 03/22/20 1050      Goals   Current Weight  205 lb (93 kg)    Nutrition Goal  Pt to identify and limit food sources of saturated fat, trans fat, refined carbohydrates and sodium      Personal Goal #2 Re-Evaluation   Personal Goal #2  Pt to build a healthy plate including vegetables, fruits, whole grains, and low-fat dairy products in a heart healthy meal plan.       Psychosocial: Target Goals: Acknowledge presence or absence of significant depression and/or stress, maximize coping skills, provide positive support system. Participant is able to verbalize types and ability to use techniques and skills needed for reducing stress and depression.  Initial Review & Psychosocial Screening: Initial Psych Review & Screening - 03/14/20  1014      Initial Review   Current issues with  None Identified      Family Dynamics   Good Support System?  Yes    Comments  Mr. Wesson has a positive attitude and outlook. He admits to having a strong support system including family and friends. He has adult children and is married. He denies psychosocial barriers to participation in CR at this time. No intervention is needed.      Barriers   Psychosocial barriers to participate in program  There are no identifiable barriers or psychosocial needs.      Screening Interventions   Interventions  Encouraged to exercise       Quality of Life Scores: Quality of Life - 03/14/20 1120      Quality of Life   Select  Quality of Life      Quality of Life Scores   Health/Function Pre  21.86 %    Socioeconomic Pre  30 %  Psych/Spiritual Pre  26 %    Family Pre  30 %    GLOBAL Pre  25.4 %      Scores of 19 and below usually indicate a poorer quality of life in these areas.  A difference of  2-3 points is a clinically meaningful difference.  A difference of 2-3 points in the total score of the Quality of Life Index has been associated with significant improvement in overall quality of life, self-image, physical symptoms, and general health in studies assessing change in quality of life.  PHQ-9: Recent Review Flowsheet Data    Depression screen [facility] 2/9 03/14/2020 09/16/2019 07/06/2019 07/09/2018 04/14/2017   Decreased Interest 0 0 0 0 0   Down, Depressed, Hopeless 0 0 0 0 0   PHQ - 2 Score 0 0 0 0 0   Altered sleeping 2 3 - - -   Tired, decreased energy 1 3 - - -   Change in appetite 0 1 - - -   Feeling bad or failure about yourself  0 0 - - -   Trouble concentrating 0 0 - - -   Moving slowly or fidgety/restless 0 3 - - -   Suicidal thoughts 0 0 - - -   PHQ-9 Score 3 10 - - -   Difficult doing work/chores Not difficult at all - - - -     Interpretation of Total Score  Total Score Depression Severity:  1-4 = Minimal depression, 5-9  = Mild depression, 10-14 = Moderate depression, 15-19 = Moderately severe depression, 20-27 = Severe depression   Psychosocial Evaluation and Intervention:   Psychosocial Re-Evaluation: Psychosocial Re-Evaluation    Row Name 03/21/20 0841 04/04/20 1333           Psychosocial Re-Evaluation   Current issues with  None Identified  None Identified      Interventions  Encouraged to attend Cardiac Rehabilitation for the exercise  Encouraged to attend Cardiac Rehabilitation for the exercise      Continue Psychosocial Services   No Follow up required  No Follow up required         Psychosocial Discharge (Final Psychosocial Re-Evaluation): Psychosocial Re-Evaluation - 04/04/20 1333      Psychosocial Re-Evaluation   Current issues with  None Identified    Interventions  Encouraged to attend Cardiac Rehabilitation for the exercise    Continue Psychosocial Services   No Follow up required       Vocational Rehabilitation: Provide vocational rehab assistance to qualifying candidates.   Vocational Rehab Evaluation & Intervention:   Education: Education Goals: Education classes will be provided on a weekly basis, covering required topics. Participant will state understanding/return demonstration of topics presented.  Learning Barriers/Preferences:   Education Topics: Hypertension, Hypertension Reduction -Define heart disease and high blood pressure. Discus how high blood pressure affects the body and ways to reduce high blood pressure.   Exercise and Your Heart -Discuss why it is important to exercise, the FITT principles of exercise, normal and abnormal responses to exercise, and how to exercise safely.   Angina -Discuss definition of angina, causes of angina, treatment of angina, and how to decrease risk of having angina.   Cardiac Medications -Review what the following cardiac medications are used for, how they affect the body, and side effects that may occur when taking the  medications.  Medications include Aspirin, Beta blockers, calcium channel blockers, ACE Inhibitors, angiotensin receptor blockers, diuretics, digoxin, and antihyperlipidemics.   Congestive Heart Failure -Discuss the  definition of CHF, how to live with CHF, the signs and symptoms of CHF, and how keep track of weight and sodium intake.   Heart Disease and Intimacy -Discus the effect sexual activity has on the heart, how changes occur during intimacy as we age, and safety during sexual activity.   Smoking Cessation / COPD -Discuss different methods to quit smoking, the health benefits of quitting smoking, and the definition of COPD.   Nutrition I: Fats -Discuss the types of cholesterol, what cholesterol does to the heart, and how cholesterol levels can be controlled.   Nutrition II: Labels -Discuss the different components of food labels and how to read food label   Heart Parts/Heart Disease and PAD -Discuss the anatomy of the heart, the pathway of blood circulation through the heart, and these are affected by heart disease.   Stress I: Signs and Symptoms -Discuss the causes of stress, how stress may lead to anxiety and depression, and ways to limit stress.   Stress II: Relaxation -Discuss different types of relaxation techniques to limit stress.   Warning Signs of Stroke / TIA -Discuss definition of a stroke, what the signs and symptoms are of a stroke, and how to identify when someone is having stroke.   Knowledge Questionnaire Score: Knowledge Questionnaire Score - 03/14/20 1120      Knowledge Questionnaire Score   Pre Score  24/24       Core Components/Risk Factors/Patient Goals at Admission: Personal Goals and Risk Factors at Admission - 03/14/20 0913      Core Components/Risk Factors/Patient Goals on Admission   Lipids  Yes    Intervention  Provide education and support for participant on nutrition & aerobic/resistive exercise along with prescribed medications  to achieve LDL 70mg , HDL >40mg .    Expected Outcomes  Short Term: Participant states understanding of desired cholesterol values and is compliant with medications prescribed. Participant is following exercise prescription and nutrition guidelines.;Long Term: Cholesterol controlled with medications as prescribed, with individualized exercise RX and with personalized nutrition plan. Value goals: LDL < 70mg , HDL > 40 mg.       Core Components/Risk Factors/Patient Goals Review:  Goals and Risk Factor Review    Row Name 03/21/20 0843 04/04/20 1334           Core Components/Risk Factors/Patient Goals Review   Personal Goals Review  Weight Management/Obesity;Lipids  Weight Management/Obesity;Lipids      Review  Tarik started exercise on 03/21/20 and did well with exercise. Will continue to monitor BP's at home as Haydar says sometimes he gets low systolic bP in the [id number]  Tracy is doing well with exercise at cardiac rehab. Kolson's systolic BP's remains in the mid 90's to low 100 resting. Patient remains asymptomatic. Dr Gwenlyn Found is aware      Expected Outcomes  Elisandro will continue to participate in phase 2 cardiac rehab for exercise, nutrtion and lifestyle modifications  Mateus will continue to participate in phase 2 cardiac rehab for exercise, nutrtion and lifestyle modifications         Core Components/Risk Factors/Patient Goals at Discharge (Final Review):  Goals and Risk Factor Review - 04/04/20 1334      Core Components/Risk Factors/Patient Goals Review   Personal Goals Review  Weight Management/Obesity;Lipids    Review  Usher is doing well with exercise at cardiac rehab. Mary's systolic BP's remains in the mid 90's to low 100 resting. Patient remains asymptomatic. Dr Gwenlyn Found is aware    Expected Outcomes  Maciah will continue  to participate in phase 2 cardiac rehab for exercise, nutrtion and lifestyle modifications       ITP Comments: ITP Comments    Row Name 03/14/20 0931  03/21/20 0839 04/04/20 1331       ITP Comments  Dr. Fransico Him, Medical Director Cardiac Rehab [place]  30 Day ITP Review. Jonavon started cardiac rehab on 03/20/20 and did well with exercise.  30 Day ITP Review. Patient with good participation and attendance in phase 2 cardiac rehab.        Comments: See ITP Comments.[name], RN,BSN 04/06/2020 12:09 PM

## 2020-04-05 ENCOUNTER — Other Ambulatory Visit: Payer: Self-pay

## 2020-04-05 ENCOUNTER — Encounter (HOSPITAL_COMMUNITY)
Admission: RE | Admit: 2020-04-05 | Discharge: 2020-04-05 | Disposition: A | Discharge status: Home / Self Care | Payer: Medicare Other | Source: Ambulatory Visit | Attending: Cardiovascular Disease | Admitting: Cardiovascular Disease

## 2020-04-05 DIAGNOSIS — Z955 Presence of coronary angioplasty implant and graft: Secondary | ICD-10-CM | POA: Diagnosis not present

## 2020-04-05 DIAGNOSIS — I2102 ST elevation (STEMI) myocardial infarction involving left anterior descending coronary artery: Secondary | ICD-10-CM

## 2020-04-05 DIAGNOSIS — Z9861 Coronary angioplasty status: Secondary | ICD-10-CM

## 2020-04-05 DIAGNOSIS — Z7902 Long term (current) use of antithrombotics/antiplatelets: Secondary | ICD-10-CM | POA: Diagnosis not present

## 2020-04-05 DIAGNOSIS — I252 Old myocardial infarction: Secondary | ICD-10-CM | POA: Diagnosis not present

## 2020-04-05 DIAGNOSIS — I2510 Atherosclerotic heart disease of native coronary artery without angina pectoris: Secondary | ICD-10-CM | POA: Diagnosis not present

## 2020-04-05 DIAGNOSIS — Z7982 Long term (current) use of aspirin: Secondary | ICD-10-CM | POA: Diagnosis not present

## 2020-04-05 DIAGNOSIS — Z79899 Other long term (current) drug therapy: Secondary | ICD-10-CM | POA: Diagnosis not present

## 2020-04-07 ENCOUNTER — Encounter (HOSPITAL_COMMUNITY)
Admission: RE | Admit: 2020-04-07 | Discharge: 2020-04-07 | Disposition: A | Discharge status: Home / Self Care | Payer: Medicare Other | Source: Ambulatory Visit | Attending: Cardiovascular Disease | Admitting: Cardiovascular Disease

## 2020-04-07 ENCOUNTER — Other Ambulatory Visit: Payer: Self-pay

## 2020-04-07 DIAGNOSIS — Z7982 Long term (current) use of aspirin: Secondary | ICD-10-CM | POA: Diagnosis not present

## 2020-04-07 DIAGNOSIS — I2510 Atherosclerotic heart disease of native coronary artery without angina pectoris: Secondary | ICD-10-CM | POA: Diagnosis not present

## 2020-04-07 DIAGNOSIS — Z7902 Long term (current) use of antithrombotics/antiplatelets: Secondary | ICD-10-CM | POA: Diagnosis not present

## 2020-04-07 DIAGNOSIS — Z955 Presence of coronary angioplasty implant and graft: Secondary | ICD-10-CM | POA: Diagnosis not present

## 2020-04-07 DIAGNOSIS — Z79899 Other long term (current) drug therapy: Secondary | ICD-10-CM | POA: Diagnosis not present

## 2020-04-07 DIAGNOSIS — I252 Old myocardial infarction: Secondary | ICD-10-CM | POA: Diagnosis not present

## 2020-04-07 DIAGNOSIS — I2102 ST elevation (STEMI) myocardial infarction involving left anterior descending coronary artery: Secondary | ICD-10-CM

## 2020-04-10 ENCOUNTER — Encounter (HOSPITAL_COMMUNITY)
Admission: RE | Admit: 2020-04-10 | Discharge: 2020-04-10 | Disposition: A | Discharge status: Home / Self Care | Payer: Medicare Other | Source: Ambulatory Visit | Attending: Cardiovascular Disease | Admitting: Cardiovascular Disease

## 2020-04-10 ENCOUNTER — Other Ambulatory Visit: Payer: Self-pay

## 2020-04-10 DIAGNOSIS — I2102 ST elevation (STEMI) myocardial infarction involving left anterior descending coronary artery: Secondary | ICD-10-CM

## 2020-04-10 DIAGNOSIS — Z955 Presence of coronary angioplasty implant and graft: Secondary | ICD-10-CM | POA: Diagnosis not present

## 2020-04-10 DIAGNOSIS — I252 Old myocardial infarction: Secondary | ICD-10-CM | POA: Diagnosis not present

## 2020-04-10 DIAGNOSIS — Z7902 Long term (current) use of antithrombotics/antiplatelets: Secondary | ICD-10-CM | POA: Diagnosis not present

## 2020-04-10 DIAGNOSIS — I2510 Atherosclerotic heart disease of native coronary artery without angina pectoris: Secondary | ICD-10-CM

## 2020-04-10 DIAGNOSIS — Z9861 Coronary angioplasty status: Secondary | ICD-10-CM

## 2020-04-10 DIAGNOSIS — Z7982 Long term (current) use of aspirin: Secondary | ICD-10-CM | POA: Diagnosis not present

## 2020-04-10 DIAGNOSIS — Z79899 Other long term (current) drug therapy: Secondary | ICD-10-CM | POA: Diagnosis not present

## 2020-04-11 ENCOUNTER — Encounter: Payer: Self-pay | Admitting: Cardiovascular Disease

## 2020-04-11 ENCOUNTER — Ambulatory Visit (INDEPENDENT_AMBULATORY_CARE_PROVIDER_SITE_OTHER): Payer: Medicare Other | Admitting: Cardiovascular Disease

## 2020-04-11 VITALS — BP 120/60 | HR 54 | Temp 98.0°F | Ht 72.0 in | Wt 196.6 lb

## 2020-04-11 DIAGNOSIS — Z9861 Coronary angioplasty status: Secondary | ICD-10-CM | POA: Diagnosis not present

## 2020-04-11 DIAGNOSIS — E785 Hyperlipidemia, unspecified: Secondary | ICD-10-CM | POA: Diagnosis not present

## 2020-04-11 DIAGNOSIS — I2510 Atherosclerotic heart disease of native coronary artery without angina pectoris: Secondary | ICD-10-CM | POA: Diagnosis not present

## 2020-04-11 DIAGNOSIS — I255 Ischemic cardiomyopathy: Secondary | ICD-10-CM | POA: Diagnosis not present

## 2020-04-11 MED ORDER — CLOPIDOGREL BISULFATE 75 MG PO TABS: 75.0000 mg | ORAL_TABLET | Freq: Every day | ORAL | 3 refills | Status: DC

## 2020-04-11 NOTE — Assessment & Plan Note (Signed)
History of ischemic cardiomyopathy with an EF initially in the 45% range post anterior STEMI in July of last year which increased to the 50 to 55% range in November on low-dose carvedilol.  He is not on an ARB because of relative hypotension.

## 2020-04-11 NOTE — Patient Instructions (Signed)
Medication Instructions:  STOP BRILINTA THE LAST DAY OF July THEN START PLAVIX 75 MG ONCE DAILY  *If you need a refill on your cardiac medications before your next appointment, please call your pharmacy*   Lab Work: Your physician recommends that you return for lab work in: [place]  If you have labs (blood work) drawn today and your tests are completely normal, you will receive your results only by: Marland Kitchen MyChart Message (if you have MyChart) OR . A paper copy in the mail If you have any lab test that is abnormal or we need to change your treatment, we will call you to review the results.  Follow-Up: At [facility], you and your health needs are our priority.  As part of our continuing mission to provide you with exceptional heart care, we have created designated Provider Care Teams.  These Care Teams include your primary Cardiologist (physician) and Advanced Practice Providers (APPs -  Physician Assistants and Nurse Practitioners) who all work together to provide you with the care you need, when you need it.  We recommend signing up for the patient portal called "MyChart".  Sign up information is provided on this After Visit Summary.  MyChart is used to connect with patients for Virtual Visits (Telemedicine).  Patients are able to view lab/test results, encounter notes, upcoming appointments, etc.  Non-urgent messages can be sent to your provider as well.   To learn more about what you can do with MyChart, go to [email or web address].    Your next appointment:   12 month(s)  The format for your next appointment:   Either In Person or Virtual  Provider:   You may see Quay Burow, MD or one of the following Advanced Practice Providers on your designated Care Team:    Kerin Ransom, PA-C  [place], [place]  Coletta Memos, [place]

## 2020-04-11 NOTE — Progress Notes (Signed)
04/11/2020 [name]   1951/09/11  [id number]  Primary Physician Janith Lima, MD Primary Cardiologist: Lorretta Harp MD Lupe Carney, [place]  HPI:  John Mccarthy is a 69 y.o.  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 He is brought to the cath lab emergently and radial cath by myself revealing an ostial LAD occlusion which I stented with a synergy (3 mm x 12 mm)drug-eluting stent. He did have a 60% proximal to mid dominant RCA stenosis which I elected to treat medically. EF was 50% with anteroapical wall motion abnormality.His door to balloon time was 22 minutes. Follow-up 2-D echocardiogram performed 06/24/17 revealed normalization of LV function.He was on dual antiplatelet therapy including aspirin and Plavix until recently when he discontinued the Plavix which is appropriate. He is asymptomatic back playing tennisseveral times a week including singles tennis1-1/2hours at a time without limitation.   I last saw him in the hospital this past July when he presented with an anterior STEMI with occlusion of the previously placed stent.  I intervened on him 06/28/2019 and placed 3 additional stents in his proximal LAD.  His durable in time was 46 minutes, delayed because of hypotension and reperfusion arrhythmias.  He did have a 99% ostial first diagonal branch stenosis which was jailed vessel and up in the previously placed stents as well as a 70% mid dominant RCA stenosis that was stable.  His EF was 45 to 50% with anteroapical hypokinesia.  He is asymptomatic.  He is out walking every day, and is currently participating in cardiac rehab. He remains on dual antiplatelet therapy as well as high-dose statin therapy.  He checks his blood pressure at home  religiously which runs in the 110/70 range.  Since I saw him 6 months ago he has seen Kerin Ransom in March of this year.  He continues to do well participating cardiac rehab.  Is very active and exercises without limitation.  His only complaint is of morning fatigue.  He denies chest pain or shortness of breath.    Current Meds  Medication Sig  . aspirin EC 81 MG tablet Take 1 tablet (81 mg total) by mouth daily.  Marland Kitchen atorvastatin (LIPITOR) 80 MG tablet TAKE 1 TABLET BY MOUTH EVERY DAY AT 6 PM  . carvedilol (COREG) 6.25 MG tablet Take 1 tablet (6.25 mg total) by mouth 2 (two) times daily with a meal.  . Multiple Vitamins-Minerals (CENTRUM SILVER 50+MEN PO) Take 1 tablet by mouth daily.  . sertraline (ZOLOFT) 50 MG tablet Take 1 tablet (50 mg total) by mouth daily.  . [DISCONTINUED] ticagrelor (BRILINTA) 90 MG TABS tablet Take 1 tablet (90 mg total) by mouth 2 (two) times daily.     Allergies  Allergen Reactions  . Crestor [Rosuvastatin Calcium] Other (See Comments)    Muscle aches  . Lactose Intolerance (Gi) Diarrhea    Social History   Socioeconomic History  . Marital status: Married    Spouse name: Not on file  . Number of children: Not on file  . Years of education: Not on file  . Highest education level: Not on file  Occupational History  . Not on file  Tobacco Use  . Smoking status: Never Smoker  . Smokeless tobacco: Never Used  Substance and Sexual Activity  .  Alcohol use: Yes    Alcohol/week: 5.0 standard drinks    Types: 5 Shots of liquor per week    Comment: occassional  . Drug use: No  . Sexual activity: Yes    Birth control/protection: None  Other Topics Concern  . Not on file  Social History Narrative  . Not on file   Social Determinants of Health   Financial Resource Strain: Low Risk   . Difficulty of Paying Living Expenses: Not hard at all  Food Insecurity: No Food Insecurity  . Worried About Charity fundraiser in the Last Year: Never true  . Ran  Out of Food in the Last Year: Never true  Transportation Needs: No Transportation Needs  . Lack of Transportation (Medical): No  . Lack of Transportation (Non-Medical): No  Physical Activity: Insufficiently Active  . Days of Exercise per Week: 3 days  . Minutes of Exercise per Session: 30 min  Stress: No Stress Concern Present  . Feeling of Stress : Not at all  Social Connections: Unknown  . Frequency of Communication with Friends and Family: Twice a week  . Frequency of Social Gatherings with Friends and Family: Once a week  . Attends Religious Services: Not on file  . Active Member of Clubs or Organizations: Not on file  . Attends Archivist Meetings: Not on file  . Marital Status: Married  Human resources officer Violence: Not At Risk  . Fear of Current or Ex-Partner: No  . Emotionally Abused: No  . Physically Abused: No  . Sexually Abused: No     Review of Systems: General: negative for chills, fever, night sweats or weight changes.  Cardiovascular: negative for chest pain, dyspnea on exertion, edema, orthopnea, palpitations, paroxysmal nocturnal dyspnea or shortness of breath Dermatological: negative for rash Respiratory: negative for cough or wheezing Urologic: negative for hematuria Abdominal: negative for nausea, vomiting, diarrhea, bright red blood per rectum, melena, or hematemesis Neurologic: negative for visual changes, syncope, or dizziness All other systems reviewed and are otherwise negative except as noted above.    Blood pressure 120/60, pulse (!) 54, temperature 98 F (36.7 C), height 6' (1.829 m), weight 196 lb 9.6 oz (89.2 kg), SpO2 95 %.  General appearance: alert and no distress Neck: no adenopathy, no carotid bruit, no JVD, supple, symmetrical, trachea midline and thyroid not enlarged, symmetric, no tenderness/mass/nodules Lungs: clear to auscultation bilaterally Heart: regular rate and rhythm, S1, S2 normal, no murmur, click, rub or  gallop Extremities: extremities normal, atraumatic, no cyanosis or edema Pulses: 2+ and symmetric Skin: Skin color, texture, turgor normal. No rashes or lesions Neurologic: Alert and oriented X 3, normal strength and tone. Normal symmetric reflexes. Normal coordination and gait  EKG sinus bradycardia at 54 with anterior Q waves suggesting old septal infarct.  I personally reviewed this EKG.  ASSESSMENT AND PLAN:   Dyslipidemia, goal LDL below 70 History of dyslipidemia on high-dose statin therapy with lipid profile performed 10/08/2019 Total cholesterol 105, LDL 44 and HDL 43.  CAD S/P percutaneous coronary angioplasty History of CAD status post LAD intervention with drug-eluting stent 02/11/2017.  He presented with an anterior wall myocardial infarction 06/28/2019 with an occluded proximal LAD stent.  He had 3 additional stents placed with a door to balloon time of 46 minutes.  This was delayed because of hypotension and reperfusion arrhythmias.  He did have a 99% ostial first diagonal branch stenosis which was jailed by the previously placed stents.  His EF initially was 45  to 50% with anteroapical hypokinesia.  Subsequent 2D echo performed 10/08/2019 revealed an EF of 50 to 55% without wall motion abnormality.  He remains on dual antiplatelet therapy including aspirin and Brilinta.  Going to transition him to Plavix at the end of July of this year  Ischemic cardiomyopathy History of ischemic cardiomyopathy with an EF initially in the 45% range post anterior STEMI in July of last year which increased to the 50 to 55% range in November on low-dose carvedilol.  He is not on an ARB because of relative hypotension.      Lorretta Harp MD FACP,FACC,FAHA, [facility] 04/11/2020 8:29 AM

## 2020-04-11 NOTE — Assessment & Plan Note (Signed)
History of dyslipidemia on high-dose statin therapy with lipid profile performed 10/08/2019 Total cholesterol 105, LDL 44 and HDL 43.

## 2020-04-11 NOTE — Assessment & Plan Note (Signed)
History of CAD status post LAD intervention with drug-eluting stent 02/11/2017.  He presented with an anterior wall myocardial infarction 06/28/2019 with an occluded proximal LAD stent.  He had 3 additional stents placed with a door to balloon time of 46 minutes.  This was delayed because of hypotension and reperfusion arrhythmias.  He did have a 99% ostial first diagonal branch stenosis which was jailed by the previously placed stents.  His EF initially was 45 to 50% with anteroapical hypokinesia.  Subsequent 2D echo performed 10/08/2019 revealed an EF of 50 to 55% without wall motion abnormality.  He remains on dual antiplatelet therapy including aspirin and Brilinta.  Going to transition him to Plavix at the end of July of this year

## 2020-04-12 ENCOUNTER — Other Ambulatory Visit: Payer: Self-pay

## 2020-04-12 ENCOUNTER — Encounter (HOSPITAL_COMMUNITY)
Admission: RE | Admit: 2020-04-12 | Discharge: 2020-04-12 | Disposition: A | Discharge status: Home / Self Care | Payer: Medicare Other | Source: Ambulatory Visit | Attending: Cardiovascular Disease | Admitting: Cardiovascular Disease

## 2020-04-12 DIAGNOSIS — Z955 Presence of coronary angioplasty implant and graft: Secondary | ICD-10-CM | POA: Diagnosis not present

## 2020-04-12 DIAGNOSIS — Z79899 Other long term (current) drug therapy: Secondary | ICD-10-CM | POA: Diagnosis not present

## 2020-04-12 DIAGNOSIS — I2102 ST elevation (STEMI) myocardial infarction involving left anterior descending coronary artery: Secondary | ICD-10-CM

## 2020-04-12 DIAGNOSIS — Z7902 Long term (current) use of antithrombotics/antiplatelets: Secondary | ICD-10-CM | POA: Diagnosis not present

## 2020-04-12 DIAGNOSIS — I2510 Atherosclerotic heart disease of native coronary artery without angina pectoris: Secondary | ICD-10-CM | POA: Diagnosis not present

## 2020-04-12 DIAGNOSIS — Z7982 Long term (current) use of aspirin: Secondary | ICD-10-CM | POA: Diagnosis not present

## 2020-04-12 DIAGNOSIS — Z9861 Coronary angioplasty status: Secondary | ICD-10-CM

## 2020-04-12 DIAGNOSIS — I252 Old myocardial infarction: Secondary | ICD-10-CM | POA: Diagnosis not present

## 2020-04-12 NOTE — Progress Notes (Signed)
Reviewed home exercise prescription with patient today. Pt voices he is currently walking 1-2 days per week for about an hour but is inconsistent with this activity. The importance of developing a consistent walking/exercise routine discussed. Pt agrees that it is important to do so. [place] of safe exercise program reviewed. Pt was encouraged to always take prescribed medications prior to engaging in exercise/physcial activity. Stressed to carry cell phone/ID when walking outdoors. Appropriate clothing/shoe choices discussed. Weather parameters for temperature and humidity reviewed and hydration before, during, and activity stressed. THR of 60-120 reviewed with rationale for the PRE rating of 11-13 with activity. Warm-up and cool-down encouraged. The prescription of walking 2-4 days/week for 30-45 minutes reviewed. The safe and proper use of NTG discussed and patient was encouraged to cary at all times. Reviewed signs and symptoms that would require patient to terminate exercise.  Instructed if symptoms resolve that he should call his physician. If they do not resolve he should call 911. Patient verbalized understanding of exercise prescription. Pt provided copy.   Lesly Rubenstein, MS, ACSM EP-C, CCRP

## 2020-04-13 DIAGNOSIS — L738 Other specified follicular disorders: Secondary | ICD-10-CM | POA: Diagnosis not present

## 2020-04-13 DIAGNOSIS — L814 Other melanin hyperpigmentation: Secondary | ICD-10-CM | POA: Diagnosis not present

## 2020-04-13 DIAGNOSIS — Z85828 Personal history of other malignant neoplasm of skin: Secondary | ICD-10-CM | POA: Diagnosis not present

## 2020-04-13 DIAGNOSIS — L570 Actinic keratosis: Secondary | ICD-10-CM | POA: Diagnosis not present

## 2020-04-13 DIAGNOSIS — D2271 Melanocytic nevi of right lower limb, including hip: Secondary | ICD-10-CM | POA: Diagnosis not present

## 2020-04-13 DIAGNOSIS — Z85820 Personal history of malignant melanoma of skin: Secondary | ICD-10-CM | POA: Diagnosis not present

## 2020-04-13 DIAGNOSIS — D1801 Hemangioma of skin and subcutaneous tissue: Secondary | ICD-10-CM | POA: Diagnosis not present

## 2020-04-13 DIAGNOSIS — D2272 Melanocytic nevi of left lower limb, including hip: Secondary | ICD-10-CM | POA: Diagnosis not present

## 2020-04-13 DIAGNOSIS — D2262 Melanocytic nevi of left upper limb, including shoulder: Secondary | ICD-10-CM | POA: Diagnosis not present

## 2020-04-13 DIAGNOSIS — L821 Other seborrheic keratosis: Secondary | ICD-10-CM | POA: Diagnosis not present

## 2020-04-13 DIAGNOSIS — D225 Melanocytic nevi of trunk: Secondary | ICD-10-CM | POA: Diagnosis not present

## 2020-04-14 ENCOUNTER — Encounter (HOSPITAL_COMMUNITY)
Admission: RE | Admit: 2020-04-14 | Discharge: 2020-04-14 | Disposition: A | Discharge status: Home / Self Care | Payer: Medicare Other | Source: Ambulatory Visit | Attending: Cardiovascular Disease | Admitting: Cardiovascular Disease

## 2020-04-14 ENCOUNTER — Other Ambulatory Visit: Payer: Self-pay

## 2020-04-14 DIAGNOSIS — I2510 Atherosclerotic heart disease of native coronary artery without angina pectoris: Secondary | ICD-10-CM | POA: Diagnosis not present

## 2020-04-14 DIAGNOSIS — Z79899 Other long term (current) drug therapy: Secondary | ICD-10-CM | POA: Diagnosis not present

## 2020-04-14 DIAGNOSIS — Z9861 Coronary angioplasty status: Secondary | ICD-10-CM

## 2020-04-14 DIAGNOSIS — Z7902 Long term (current) use of antithrombotics/antiplatelets: Secondary | ICD-10-CM | POA: Diagnosis not present

## 2020-04-14 DIAGNOSIS — I252 Old myocardial infarction: Secondary | ICD-10-CM | POA: Diagnosis not present

## 2020-04-14 DIAGNOSIS — Z7982 Long term (current) use of aspirin: Secondary | ICD-10-CM | POA: Diagnosis not present

## 2020-04-14 DIAGNOSIS — Z955 Presence of coronary angioplasty implant and graft: Secondary | ICD-10-CM | POA: Diagnosis not present

## 2020-04-14 DIAGNOSIS — I2102 ST elevation (STEMI) myocardial infarction involving left anterior descending coronary artery: Secondary | ICD-10-CM

## 2020-04-17 ENCOUNTER — Encounter (HOSPITAL_COMMUNITY): Payer: Medicare Other

## 2020-04-19 ENCOUNTER — Encounter (HOSPITAL_COMMUNITY): Payer: Medicare Other

## 2020-04-21 ENCOUNTER — Encounter (HOSPITAL_COMMUNITY): Payer: Medicare Other

## 2020-04-24 ENCOUNTER — Encounter (HOSPITAL_COMMUNITY)
Admission: RE | Admit: 2020-04-24 | Discharge: 2020-04-24 | Disposition: A | Discharge status: Home / Self Care | Payer: Medicare Other | Source: Ambulatory Visit | Attending: Cardiovascular Disease | Admitting: Cardiovascular Disease

## 2020-04-24 ENCOUNTER — Other Ambulatory Visit: Payer: Self-pay

## 2020-04-24 DIAGNOSIS — Z9861 Coronary angioplasty status: Secondary | ICD-10-CM

## 2020-04-24 DIAGNOSIS — I2102 ST elevation (STEMI) myocardial infarction involving left anterior descending coronary artery: Secondary | ICD-10-CM

## 2020-04-24 DIAGNOSIS — Z7902 Long term (current) use of antithrombotics/antiplatelets: Secondary | ICD-10-CM | POA: Diagnosis not present

## 2020-04-24 DIAGNOSIS — Z955 Presence of coronary angioplasty implant and graft: Secondary | ICD-10-CM | POA: Diagnosis not present

## 2020-04-24 DIAGNOSIS — I252 Old myocardial infarction: Secondary | ICD-10-CM | POA: Diagnosis not present

## 2020-04-24 DIAGNOSIS — Z79899 Other long term (current) drug therapy: Secondary | ICD-10-CM | POA: Diagnosis not present

## 2020-04-24 DIAGNOSIS — I2510 Atherosclerotic heart disease of native coronary artery without angina pectoris: Secondary | ICD-10-CM | POA: Diagnosis not present

## 2020-04-24 DIAGNOSIS — Z7982 Long term (current) use of aspirin: Secondary | ICD-10-CM | POA: Diagnosis not present

## 2020-04-26 ENCOUNTER — Other Ambulatory Visit: Payer: Self-pay

## 2020-04-26 ENCOUNTER — Encounter (HOSPITAL_COMMUNITY)
Admission: RE | Admit: 2020-04-26 | Discharge: 2020-04-26 | Disposition: A | Discharge status: Home / Self Care | Payer: Medicare Other | Source: Ambulatory Visit | Attending: Cardiovascular Disease | Admitting: Cardiovascular Disease

## 2020-04-26 DIAGNOSIS — I2510 Atherosclerotic heart disease of native coronary artery without angina pectoris: Secondary | ICD-10-CM

## 2020-04-26 DIAGNOSIS — Z7982 Long term (current) use of aspirin: Secondary | ICD-10-CM | POA: Diagnosis not present

## 2020-04-26 DIAGNOSIS — Z7902 Long term (current) use of antithrombotics/antiplatelets: Secondary | ICD-10-CM | POA: Diagnosis not present

## 2020-04-26 DIAGNOSIS — Z79899 Other long term (current) drug therapy: Secondary | ICD-10-CM | POA: Diagnosis not present

## 2020-04-26 DIAGNOSIS — Z955 Presence of coronary angioplasty implant and graft: Secondary | ICD-10-CM | POA: Diagnosis not present

## 2020-04-26 DIAGNOSIS — I252 Old myocardial infarction: Secondary | ICD-10-CM | POA: Diagnosis not present

## 2020-04-26 DIAGNOSIS — I2102 ST elevation (STEMI) myocardial infarction involving left anterior descending coronary artery: Secondary | ICD-10-CM

## 2020-04-28 ENCOUNTER — Other Ambulatory Visit: Payer: Self-pay

## 2020-04-28 ENCOUNTER — Encounter (HOSPITAL_COMMUNITY)
Admission: RE | Admit: 2020-04-28 | Discharge: 2020-04-28 | Disposition: A | Discharge status: Home / Self Care | Payer: Medicare Other | Source: Ambulatory Visit | Attending: Cardiovascular Disease | Admitting: Cardiovascular Disease

## 2020-04-28 DIAGNOSIS — I2102 ST elevation (STEMI) myocardial infarction involving left anterior descending coronary artery: Secondary | ICD-10-CM

## 2020-04-28 DIAGNOSIS — Z7902 Long term (current) use of antithrombotics/antiplatelets: Secondary | ICD-10-CM | POA: Diagnosis not present

## 2020-04-28 DIAGNOSIS — Z7982 Long term (current) use of aspirin: Secondary | ICD-10-CM | POA: Diagnosis not present

## 2020-04-28 DIAGNOSIS — Z9861 Coronary angioplasty status: Secondary | ICD-10-CM

## 2020-04-28 DIAGNOSIS — Z955 Presence of coronary angioplasty implant and graft: Secondary | ICD-10-CM | POA: Diagnosis not present

## 2020-04-28 DIAGNOSIS — Z79899 Other long term (current) drug therapy: Secondary | ICD-10-CM | POA: Diagnosis not present

## 2020-04-28 DIAGNOSIS — I252 Old myocardial infarction: Secondary | ICD-10-CM | POA: Diagnosis not present

## 2020-04-28 DIAGNOSIS — I2510 Atherosclerotic heart disease of native coronary artery without angina pectoris: Secondary | ICD-10-CM | POA: Diagnosis not present

## 2020-05-03 ENCOUNTER — Encounter (HOSPITAL_COMMUNITY)
Admission: RE | Admit: 2020-05-03 | Discharge: 2020-05-03 | Disposition: A | Discharge status: Home / Self Care | Payer: Medicare Other | Source: Ambulatory Visit | Attending: Cardiovascular Disease | Admitting: Cardiovascular Disease

## 2020-05-03 ENCOUNTER — Other Ambulatory Visit: Payer: Self-pay

## 2020-05-03 DIAGNOSIS — Z955 Presence of coronary angioplasty implant and graft: Secondary | ICD-10-CM | POA: Insufficient documentation

## 2020-05-03 DIAGNOSIS — I252 Old myocardial infarction: Secondary | ICD-10-CM | POA: Insufficient documentation

## 2020-05-03 DIAGNOSIS — I2102 ST elevation (STEMI) myocardial infarction involving left anterior descending coronary artery: Secondary | ICD-10-CM

## 2020-05-03 DIAGNOSIS — E785 Hyperlipidemia, unspecified: Secondary | ICD-10-CM | POA: Insufficient documentation

## 2020-05-03 DIAGNOSIS — Z7982 Long term (current) use of aspirin: Secondary | ICD-10-CM | POA: Insufficient documentation

## 2020-05-03 DIAGNOSIS — I2510 Atherosclerotic heart disease of native coronary artery without angina pectoris: Secondary | ICD-10-CM | POA: Insufficient documentation

## 2020-05-03 DIAGNOSIS — Z85820 Personal history of malignant melanoma of skin: Secondary | ICD-10-CM | POA: Insufficient documentation

## 2020-05-03 DIAGNOSIS — Z79899 Other long term (current) drug therapy: Secondary | ICD-10-CM | POA: Insufficient documentation

## 2020-05-03 DIAGNOSIS — Z86711 Personal history of pulmonary embolism: Secondary | ICD-10-CM | POA: Insufficient documentation

## 2020-05-03 DIAGNOSIS — Z7902 Long term (current) use of antithrombotics/antiplatelets: Secondary | ICD-10-CM | POA: Insufficient documentation

## 2020-05-03 NOTE — Progress Notes (Signed)
Cardiac Individual Treatment Plan  Patient Details  Name: John Mccarthy MRN: [id number] Date of Birth: 1950-12-31 Referring Provider:     [place] from 03/14/2020 in [facility]  Referring Provider  Lorretta Harp, MD      Initial Encounter Date:    CARDIAC REHAB PHASE II ORIENTATION from 03/14/2020 in [place]  Date  03/14/20      Visit Diagnosis: ST elevation myocardial infarction involving left anterior descending (LAD) coronary artery ([place])  CAD S/P percutaneous coronary angioplasty  Patient's Home Medications on Admission:  Current Outpatient Medications:    aspirin EC 81 MG tablet, Take 1 tablet (81 mg total) by mouth daily., Disp: 90 tablet, Rfl: 3   atorvastatin (LIPITOR) 80 MG tablet, TAKE 1 TABLET BY MOUTH EVERY DAY AT 6 PM, Disp: 90 tablet, Rfl: 3   carvedilol (COREG) 6.25 MG tablet, Take 1 tablet (6.25 mg total) by mouth 2 (two) times daily with a meal., Disp: 180 tablet, Rfl: 3   clopidogrel (PLAVIX) 75 MG tablet, Take 1 tablet (75 mg total) by mouth daily., Disp: 90 tablet, Rfl: 3   Multiple Vitamins-Minerals (CENTRUM SILVER 50+MEN PO), Take 1 tablet by mouth daily., Disp: , Rfl:    nitroGLYCERIN (NITROSTAT) 0.4 MG SL tablet, Place 1 tablet (0.4 mg total) under the tongue every 5 (five) minutes as needed for chest pain., Disp: 25 tablet, Rfl: 3   sertraline (ZOLOFT) 50 MG tablet, Take 1 tablet (50 mg total) by mouth daily., Disp: 90 tablet, Rfl: 3  Past Medical History: Past Medical History:  Diagnosis Date   BPH (benign prostatic hyperplasia)    CAD S/P percutaneous coronary angioplasty 2018, 2020   anterior STEMI twice- LAD DES   GI bleeding 2013   Hyperlipidemia    Pulmonary embolism ([place]) 06/15/2013   after achilles tendon repair- Xarelto 6 months   Skin cancer (melanoma) ([place])     Tobacco Use: Social History   Tobacco Use  Smoking Status  Never Smoker  Smokeless Tobacco Never Used    Labs: Recent Review Flowsheet Data    Labs for ITP Cardiac and Pulmonary Rehab Latest Ref Rng & Units 06/16/2017 07/09/2018 06/28/2019 06/29/2019 10/08/2019   Cholestrol 100 - 199 mg/dL 114 122 133 143 105   LDLCALC 0 - 99 mg/dL 52 52 72 93 44   LDLDIRECT mg/dL - - - - -   HDL >39 mg/dL 40(L) 40.00 41 44 43   Trlycerides 0 - 149 mg/dL 111 151.0(H) 99 28 95   Hemoglobin A1c 4.6 - 6.5 % - - - - -   TCO2 0 - 100 mmol/L - - - - -      Capillary Blood Glucose: Lab Results  Component Value Date   GLUCAP 94 05/23/2015   GLUCAP 100 (H) 09/25/2012     Exercise Target Goals: Exercise Program Goal: Individual exercise prescription set using results from initial 6 min walk test and THRR while considering  patients activity barriers and safety.   Exercise Prescription Goal: Starting with aerobic activity 30 plus minutes a day, 3 days per week for initial exercise prescription. Provide home exercise prescription and guidelines that participant acknowledges understanding prior to discharge.  Activity Barriers & Risk Stratification: Activity Barriers & Cardiac Risk Stratification - 03/14/20 0913      Activity Barriers & Cardiac Risk Stratification   Activity Barriers  Arthritis    Cardiac Risk Stratification  High  6 Minute Walk: 6 Minute Walk    Row Name 03/14/20 0921         6 Minute Walk   Phase  Initial     Distance  1834 feet     Walk Time  6 minutes     # of Rest Breaks  0     MPH  3.47     METS  3.84     RPE  11     Perceived Dyspnea   0     VO2 Peak  13.45     Symptoms  No     Resting HR  67 bpm     Resting BP  100/56     Resting Oxygen Saturation   98 %     Exercise Oxygen Saturation  during 6 min walk  96 %     Max Ex. HR  105 bpm     Max Ex. BP  128/72     2 Minute Post BP  138/80        Oxygen Initial Assessment:   Oxygen Re-Evaluation:   Oxygen Discharge (Final Oxygen Re-Evaluation):   Initial  Exercise Prescription: Initial Exercise Prescription - 03/14/20 1100      Date of Initial Exercise RX and Referring Provider   Date  03/14/20    Referring Provider  Lorretta Harp, MD    Expected Discharge Date  05/12/20      Bike   Level  3    Minutes  15    METs  3.2      NuStep   Level  4    SPM  85    Minutes  15    METs  3.2      Prescription Details   Frequency (times per week)  3    Duration  Progress to 30 minutes of continuous aerobic without signs/symptoms of physical distress      Intensity   Ratings of Perceived Exertion  11-13    Perceived Dyspnea  0-4      Progression   Progression  Continue to progress workloads to maintain intensity without signs/symptoms of physical distress.      Resistance Training   Training Prescription  Yes    Weight  5    Reps  10-15       Perform Capillary Blood Glucose checks as needed.  Exercise Prescription Changes:  Exercise Prescription Changes    Row Name 03/20/20 1000 04/05/20 0900 04/19/20 0707 04/28/20 0704       Response to Exercise   Blood Pressure (Admit)  98/68  96/60  110/66  98/56    Blood Pressure (Exercise)  108/70  104/72  118/78  104/70    Blood Pressure (Exit)  102/60  98/60  106/60  110/60    Heart Rate (Admit)  57 bpm  69 bpm  67 bpm  65 bpm    Heart Rate (Exercise)  83 bpm  90 bpm  104 bpm  96 bpm    Heart Rate (Exit)  67 bpm  65 bpm  73 bpm  65 bpm    Rating of Perceived Exertion (Exercise)  11  12  14  14     Perceived Dyspnea (Exercise)  0  0  0  0    Symptoms  None  None  None  None    Comments  Pt's first day of exercise  Pt tolerated exercise well  Added Ellipitical to Exercise Prescription  Added more time on  Elliptical     Duration  Progress to 30 minutes of  aerobic without signs/symptoms of physical distress  Continue with 30 min of aerobic exercise without signs/symptoms of physical distress.  Continue with 30 min of aerobic exercise without signs/symptoms of physical distress.   Continue with 30 min of aerobic exercise without signs/symptoms of physical distress.    Intensity  THRR unchanged  THRR unchanged  THRR unchanged  THRR unchanged      Progression   Progression  Continue to progress workloads to maintain intensity without signs/symptoms of physical distress.  Continue to progress workloads to maintain intensity without signs/symptoms of physical distress.  Continue to progress workloads to maintain intensity without signs/symptoms of physical distress.  Continue to progress workloads to maintain intensity without signs/symptoms of physical distress.    Average METs  3.5  4.7  4.9  4.9      Resistance Training   Training Prescription  Yes  No  Yes  No    Weight  5lbs  --  6lbs  --    Reps  10-15  --  10-15  --    Time  10 Minutes  --  10 Minutes  --      Interval Training   Interval Training  No  --  No  No      Bike   Level  4  5  5  5     Minutes  15  15  15  15     METs  4.2  5.6  5.5  5      NuStep   Level  4  5  5  5     SPM  85  105  105  105    Minutes  15  15  15  15     METs  2.8  3.5  3.3  3.2      Elliptical   Level  --  --  1  1    Speed  --  --  1  1    Minutes  --  --  5  10    METs  --  --  5.8  6.5      Home Exercise Plan   Plans to continue exercise at  --  --  Home (comment) Walking, Tennis  Home (comment) Walking, Tennis    Frequency  --  --  Add 2 additional days to program exercise sessions.  Add 2 additional days to program exercise sessions.    Initial Home Exercises Provided  --  --  04/12/20  04/12/20       Exercise Comments:  Exercise Comments    Row Name 03/20/20 1027 04/06/20 0847 04/12/20 1003 05/04/20 1337     Exercise Comments  Pt's first day of exercise. Pt responded very well to exercise prescription. Pt did seem bored with workloads. Increased pt's level on bike from 3 to 4. Will continue to monitor pt and increase workloads as tolerated.  Pt is continuing to respond well to exercise prescription. Will follow  up with home exercise plan and review plan to progress pt to the elliptical.  Reviewed home exercise program with patient. He voices walking at home but not consistantly. Pt provided copy of exercise prescription. See notes.  Pt is continuing to respond well to exercise. Pt is now able to exercise longer on the elliptical machine (about 8 minutes). Still working with pt to consistently exercise on own outside of cardiac rehab. Will continue to monitor.  Exercise Goals and Review:  Exercise Goals    Row Name 03/14/20 0913             Exercise Goals   Increase Physical Activity  Yes       Intervention  Provide advice, education, support and counseling about physical activity/exercise needs.;Develop an individualized exercise prescription for aerobic and resistive training based on initial evaluation findings, risk stratification, comorbidities and participant's personal goals.       Expected Outcomes  Short Term: Attend rehab on a regular basis to increase amount of physical activity.;Long Term: Exercising regularly at least 3-5 days a week.;Long Term: Add in home exercise to make exercise part of routine and to increase amount of physical activity.       Increase Strength and Stamina  Yes       Intervention  Provide advice, education, support and counseling about physical activity/exercise needs.;Develop an individualized exercise prescription for aerobic and resistive training based on initial evaluation findings, risk stratification, comorbidities and participant's personal goals.       Expected Outcomes  Short Term: Increase workloads from initial exercise prescription for resistance, speed, and METs.;Short Term: Perform resistance training exercises routinely during rehab and add in resistance training at home;Long Term: Improve cardiorespiratory fitness, muscular endurance and strength as measured by increased METs and functional capacity (6MWT)       Able to understand and use rate of  perceived exertion (RPE) scale  Yes       Intervention  Provide education and explanation on how to use RPE scale       Expected Outcomes  Short Term: Able to use RPE daily in rehab to express subjective intensity level;Long Term:  Able to use RPE to guide intensity level when exercising independently       Knowledge and understanding of Target Heart Rate Range (THRR)  Yes       Intervention  Provide education and explanation of THRR including how the numbers were predicted and where they are located for reference       Expected Outcomes  Short Term: Able to state/look up THRR;Long Term: Able to use THRR to govern intensity when exercising independently;Short Term: Able to use daily as guideline for intensity in rehab       Able to check pulse independently  Yes       Intervention  Provide education and demonstration on how to check pulse in carotid and radial arteries.;Review the importance of being able to check your own pulse for safety during independent exercise       Expected Outcomes  Short Term: Able to explain why pulse checking is important during independent exercise;Long Term: Able to check pulse independently and accurately       Understanding of Exercise Prescription  Yes       Intervention  Provide education, explanation, and written materials on patient's individual exercise prescription       Expected Outcomes  Short Term: Able to explain program exercise prescription;Long Term: Able to explain home exercise prescription to exercise independently          Exercise Goals Re-Evaluation : Exercise Goals Re-Evaluation    [place] Name 04/12/20 1005 05/04/20 1338           Exercise Goal Re-Evaluation   Exercise Goals Review  Increase Physical Activity;Increase Strength and Stamina;Able to understand and use rate of perceived exertion (RPE) scale;Knowledge and understanding of Target Heart Rate Range (THRR);Able to check pulse independently;Understanding of Exercise Prescription  Increase Physical Activity;Increase Strength and Stamina;Able to understand and use rate of perceived exertion (RPE) scale;Knowledge and understanding of Target Heart Rate Range (THRR);Able to check pulse independently;Understanding of Exercise Prescription      Comments  Pt voices walking for a hour 1-2 days/wk, but not consistantly. He walks up to 1 hour on these walks. Reviewed components of exercise prescription to hopefully gain more consistency in frequency and duration of exercise. Pt voices understnading of the above goals.  Pt is continuing to increase cardiovascular endurance. Pt is able to exercise at higher exercise intensities for longer periods of time. Pt states he is feeling stronger. Will continue to progress workloads as tolerated.      Expected Outcomes  Pt will walk 2-4 days/week at home for 30-45 minutes and abide by the guidlines in the exercise prescription.  Pt will continue work towards to exercising at home 2-4 days a week for 30-45 minutes.          Discharge Exercise Prescription (Final Exercise Prescription Changes): Exercise Prescription Changes - 04/28/20 0704      Response to Exercise   Blood Pressure (Admit)  98/56    Blood Pressure (Exercise)  104/70    Blood Pressure (Exit)  110/60    Heart Rate (Admit)  65 bpm    Heart Rate (Exercise)  96 bpm    Heart Rate (Exit)  65 bpm    Rating of Perceived Exertion (Exercise)  14    Perceived Dyspnea (Exercise)  0    Symptoms  None    Comments  Added more time on Elliptical     Duration  Continue with 30 min of aerobic exercise without signs/symptoms of physical distress.    Intensity  THRR unchanged      Progression   Progression  Continue to progress workloads to maintain intensity without signs/symptoms of physical distress.    Average METs  4.9      Resistance Training   Training Prescription  No      Interval Training   Interval Training  No      Bike   Level  5    Minutes  15    METs  5      NuStep    Level  5    SPM  105    Minutes  15    METs  3.2      Elliptical   Level  1    Speed  1    Minutes  10    METs  6.5      Home Exercise Plan   Plans to continue exercise at  Home (comment)   Walking, Tennis   Frequency  Add 2 additional days to program exercise sessions.    Initial Home Exercises Provided  04/12/20       Nutrition:  Target Goals: Understanding of nutrition guidelines, daily intake of sodium 1500mg , cholesterol 200mg , calories 30% from fat and 7% or less from saturated fats, daily to have 5 or more servings of fruits and vegetables.  Biometrics: Pre Biometrics - 03/14/20 0932      Pre Biometrics   Waist Circumference  41.5 inches    Hip Circumference  40.5 inches    Waist to Hip Ratio  1.02 %    Triceps Skinfold  6 mm    % Body Fat  24.7 %    Grip Strength  41.5 kg    Flexibility  0 in    Single Leg Stand  2.31 seconds        Nutrition Therapy Plan and Nutrition Goals: Nutrition Therapy & Goals - 03/22/20 1049      Nutrition Therapy   Diet  Heart Healthy      Personal Nutrition Goals   Nutrition Goal  Pt to identify and limit food sources of saturated fat, trans fat, refined carbohydrates and sodium    Personal Goal #2  Pt to build a healthy plate including vegetables, fruits, whole grains, and low-fat dairy products in a heart healthy meal plan.      Intervention Plan   Intervention  Prescribe, educate and counsel regarding individualized specific dietary modifications aiming towards targeted core components such as weight, hypertension, lipid management, diabetes, heart failure and other comorbidities.;Nutrition handout(s) given to patient.    Expected Outcomes  Short Term Goal: Understand basic principles of dietary content, such as calories, fat, sodium, cholesterol and nutrients.       Nutrition Assessments: Nutrition Assessments - 03/23/20 1130      MEDFICTS Scores   Pre Score  35       Nutrition Goals Re-Evaluation: Nutrition  Goals Re-Evaluation    Row Name 03/22/20 1050             Goals   Current Weight  205 lb (93 kg)       Nutrition Goal  Pt to identify and limit food sources of saturated fat, trans fat, refined carbohydrates and sodium         Personal Goal #2 Re-Evaluation   Personal Goal #2  Pt to build a healthy plate including vegetables, fruits, whole grains, and low-fat dairy products in a heart healthy meal plan.          Nutrition Goals Discharge (Final Nutrition Goals Re-Evaluation): Nutrition Goals Re-Evaluation - 03/22/20 1050      Goals   Current Weight  205 lb (93 kg)    Nutrition Goal  Pt to identify and limit food sources of saturated fat, trans fat, refined carbohydrates and sodium      Personal Goal #2 Re-Evaluation   Personal Goal #2  Pt to build a healthy plate including vegetables, fruits, whole grains, and low-fat dairy products in a heart healthy meal plan.       Psychosocial: Target Goals: Acknowledge presence or absence of significant depression and/or stress, maximize coping skills, provide positive support system. Participant is able to verbalize types and ability to use techniques and skills needed for reducing stress and depression.  Initial Review & Psychosocial Screening: Initial Psych Review & Screening - 03/14/20 1014      Initial Review   Current issues with  None Identified      Family Dynamics   Good Support System?  Yes    Comments  Mr. Derman has a positive attitude and outlook. He admits to having a strong support system including family and friends. He has adult children and is married. He denies psychosocial barriers to participation in CR at this time. No intervention is needed.      Barriers   Psychosocial barriers to participate in program  There are no identifiable barriers or psychosocial needs.      Screening Interventions   Interventions  Encouraged to exercise       Quality of Life Scores: Quality of Life - 03/14/20 1120       Quality of Life   Select  Quality of Life      Quality of Life Scores   Health/Function Pre  21.86 %    Socioeconomic Pre  30 %    Psych/Spiritual Pre  26 %    Family Pre  30 %    GLOBAL Pre  25.4 %      Scores of 19 and below usually indicate a poorer quality of life in these areas.  A difference of  2-3 points is a clinically meaningful difference.  A difference of 2-3 points in the total score of the Quality of Life Index has been associated with significant improvement in overall quality of life, self-image, physical symptoms, and general health in studies assessing change in quality of life.  PHQ-9: Recent Review Flowsheet Data    Depression screen [facility] 2/9 03/14/2020 09/16/2019 07/06/2019 07/09/2018 04/14/2017   Decreased Interest 0 0 0 0 0   Down, Depressed, Hopeless 0 0 0 0 0   PHQ - 2 Score 0 0 0 0 0   Altered sleeping 2 3 - - -   Tired, decreased energy 1 3 - - -   Change in appetite 0 1 - - -   Feeling bad or failure about yourself  0 0 - - -   Trouble concentrating 0 0 - - -   Moving slowly or fidgety/restless 0 3 - - -   Suicidal thoughts 0 0 - - -   PHQ-9 Score 3 10 - - -   Difficult doing work/chores Not difficult at all - - - -     Interpretation of Total Score  Total Score Depression Severity:  1-4 = Minimal depression, 5-9 = Mild depression, 10-14 = Moderate depression, 15-19 = Moderately severe depression, 20-27 = Severe depression   Psychosocial Evaluation and Intervention:   Psychosocial Re-Evaluation: Psychosocial Re-Evaluation    Row Name 03/21/20 0841 04/04/20 1333 05/03/20 1420         Psychosocial Re-Evaluation   Current issues with  None Identified  None Identified  Current Stress Concerns     Comments  --  --  Thaddius's wife was recently hospitalized.     Expected Outcomes  --  --  Will provide emotional support as needed     Interventions  Encouraged to attend Cardiac Rehabilitation for the exercise  Encouraged to attend Cardiac Rehabilitation for  the exercise  Encouraged to attend Cardiac Rehabilitation for the exercise;Stress management education     Continue Psychosocial Services   No Follow up required  No Follow up required  No Follow up required       Initial Review   Source of Stress Concerns  --  --  Family        Psychosocial Discharge (Final Psychosocial Re-Evaluation): Psychosocial Re-Evaluation - 05/03/20 1420      Psychosocial Re-Evaluation   Current issues with  Current Stress Concerns    Comments  Crosby's wife was recently hospitalized.    Expected Outcomes  Will provide emotional support as needed    Interventions  Encouraged to attend Cardiac Rehabilitation for the exercise;Stress management education    Continue Psychosocial Services   No Follow up required      Initial Review   Source of Stress Concerns  Family       Vocational Rehabilitation: Provide vocational rehab assistance to qualifying candidates.   Vocational Rehab Evaluation & Intervention:   Education: Education Goals: Education classes will be provided on a weekly basis, covering required topics. Participant will state understanding/return demonstration of topics presented.  Learning Barriers/Preferences:   Education Topics: Hypertension, Hypertension Reduction -Define heart disease and  high blood pressure. Discus how high blood pressure affects the body and ways to reduce high blood pressure.   Exercise and Your Heart -Discuss why it is important to exercise, the FITT principles of exercise, normal and abnormal responses to exercise, and how to exercise safely.   Angina -Discuss definition of angina, causes of angina, treatment of angina, and how to decrease risk of having angina.   Cardiac Medications -Review what the following cardiac medications are used for, how they affect the body, and side effects that may occur when taking the medications.  Medications include Aspirin, Beta blockers, calcium channel blockers, ACE  Inhibitors, angiotensin receptor blockers, diuretics, digoxin, and antihyperlipidemics.   Congestive Heart Failure -Discuss the definition of CHF, how to live with CHF, the signs and symptoms of CHF, and how keep track of weight and sodium intake.   Heart Disease and Intimacy -Discus the effect sexual activity has on the heart, how changes occur during intimacy as we age, and safety during sexual activity.   Smoking Cessation / COPD -Discuss different methods to quit smoking, the health benefits of quitting smoking, and the definition of COPD.   Nutrition I: Fats -Discuss the types of cholesterol, what cholesterol does to the heart, and how cholesterol levels can be controlled.   Nutrition II: Labels -Discuss the different components of food labels and how to read food label   Heart Parts/Heart Disease and PAD -Discuss the anatomy of the heart, the pathway of blood circulation through the heart, and these are affected by heart disease.   Stress I: Signs and Symptoms -Discuss the causes of stress, how stress may lead to anxiety and depression, and ways to limit stress.   Stress II: Relaxation -Discuss different types of relaxation techniques to limit stress.   Warning Signs of Stroke / TIA -Discuss definition of a stroke, what the signs and symptoms are of a stroke, and how to identify when someone is having stroke.   Knowledge Questionnaire Score: Knowledge Questionnaire Score - 03/14/20 1120      Knowledge Questionnaire Score   Pre Score  24/24       Core Components/Risk Factors/Patient Goals at Admission: Personal Goals and Risk Factors at Admission - 03/14/20 0913      Core Components/Risk Factors/Patient Goals on Admission   Lipids  Yes    Intervention  Provide education and support for participant on nutrition & aerobic/resistive exercise along with prescribed medications to achieve LDL 70mg , HDL >40mg .    Expected Outcomes  Short Term: Participant states  understanding of desired cholesterol values and is compliant with medications prescribed. Participant is following exercise prescription and nutrition guidelines.;Long Term: Cholesterol controlled with medications as prescribed, with individualized exercise RX and with personalized nutrition plan. Value goals: LDL < 70mg , HDL > 40 mg.       Core Components/Risk Factors/Patient Goals Review:  Goals and Risk Factor Review    Row Name 03/21/20 0843 04/04/20 1334 05/03/20 1423         Core Components/Risk Factors/Patient Goals Review   Personal Goals Review  Weight Management/Obesity;Lipids  Weight Management/Obesity;Lipids  Weight Management/Obesity;Lipids     Review  Valton started exercise on 03/21/20 and did well with exercise. Will continue to monitor BP's at home as Terence says sometimes he gets low systolic bP in the [id number]  Kaede is doing well with exercise at cardiac rehab. Cuinn's systolic BP's remains in the mid 90's to low 100 resting. Patient remains asymptomatic. Dr Gwenlyn Found is aware  Delfino Lovett  continues to do well with exercise at cardiac rehab.Natanael's vital signs have been stable.     Expected Outcomes  Maud will continue to participate in phase 2 cardiac rehab for exercise, nutrtion and lifestyle modifications  Amil will continue to participate in phase 2 cardiac rehab for exercise, nutrtion and lifestyle modifications  Dontray will continue to participate in phase 2 cardiac rehab for exercise, nutrtion and lifestyle modifications. Seiya will complete exercise at cardiac rehab next week.        Core Components/Risk Factors/Patient Goals at Discharge (Final Review):  Goals and Risk Factor Review - 05/03/20 1423      Core Components/Risk Factors/Patient Goals Review   Personal Goals Review  Weight Management/Obesity;Lipids    Review  Rishi continues to do well with exercise at cardiac rehab.Gabriella's vital signs have been stable.    Expected Outcomes  Brashawn will continue  to participate in phase 2 cardiac rehab for exercise, nutrtion and lifestyle modifications. Other will complete exercise at cardiac rehab next week.       ITP Comments: ITP Comments    Row Name 03/14/20 0931 03/21/20 0839 04/04/20 1331 05/03/20 1420     ITP Comments  Dr. Fransico Him, Medical Director Card[facility] [place]  30 Day ITP Review. Tamotsu started cardiac rehab on 03/20/20 and did well with exercise.  30 Day ITP Review. Patient with good participation and attendance in phase 2 cardiac rehab.  30 Day ITP Review. Patient with good participation and attendance in phase 2 cardiac rehab.       Comments: See ITP Review.[name], RN,BSN 05/04/2020 2:43 PM

## 2020-05-03 NOTE — Progress Notes (Signed)
Incomplete Session Note  Patient Details  Name: VITOLD LAPIETRA MRN: [id number] Date of Birth: 02-09-1951 Referring Provider:     [place] from 03/14/2020 in [place]  Referring Provider  Lorretta Harp, MD      [name] did not complete his rehab session.  Hamsa reported that he did not sleep well at all last night.Amrom did not exercise today and plans to return to exercise on Friday.[name], RN,BSN 05/03/2020 10:30 AM

## 2020-05-05 ENCOUNTER — Other Ambulatory Visit: Payer: Self-pay

## 2020-05-05 ENCOUNTER — Encounter (HOSPITAL_COMMUNITY)
Admission: RE | Admit: 2020-05-05 | Discharge: 2020-05-05 | Disposition: A | Discharge status: Home / Self Care | Payer: Medicare Other | Source: Ambulatory Visit | Attending: Cardiovascular Disease | Admitting: Cardiovascular Disease

## 2020-05-05 DIAGNOSIS — E785 Hyperlipidemia, unspecified: Secondary | ICD-10-CM | POA: Diagnosis not present

## 2020-05-05 DIAGNOSIS — I2102 ST elevation (STEMI) myocardial infarction involving left anterior descending coronary artery: Secondary | ICD-10-CM

## 2020-05-05 DIAGNOSIS — I252 Old myocardial infarction: Secondary | ICD-10-CM | POA: Diagnosis present

## 2020-05-05 DIAGNOSIS — Z7902 Long term (current) use of antithrombotics/antiplatelets: Secondary | ICD-10-CM | POA: Diagnosis not present

## 2020-05-05 DIAGNOSIS — Z86711 Personal history of pulmonary embolism: Secondary | ICD-10-CM | POA: Diagnosis not present

## 2020-05-05 DIAGNOSIS — Z79899 Other long term (current) drug therapy: Secondary | ICD-10-CM | POA: Diagnosis not present

## 2020-05-05 DIAGNOSIS — Z955 Presence of coronary angioplasty implant and graft: Secondary | ICD-10-CM | POA: Diagnosis not present

## 2020-05-05 DIAGNOSIS — Z85820 Personal history of malignant melanoma of skin: Secondary | ICD-10-CM | POA: Diagnosis not present

## 2020-05-05 DIAGNOSIS — Z7982 Long term (current) use of aspirin: Secondary | ICD-10-CM | POA: Diagnosis not present

## 2020-05-05 DIAGNOSIS — I2510 Atherosclerotic heart disease of native coronary artery without angina pectoris: Secondary | ICD-10-CM

## 2020-05-08 ENCOUNTER — Encounter (HOSPITAL_COMMUNITY)
Admission: RE | Admit: 2020-05-08 | Discharge: 2020-05-08 | Disposition: A | Discharge status: Home / Self Care | Payer: Medicare Other | Source: Ambulatory Visit | Attending: Cardiovascular Disease | Admitting: Cardiovascular Disease

## 2020-05-08 ENCOUNTER — Other Ambulatory Visit: Payer: Self-pay

## 2020-05-08 DIAGNOSIS — Z9861 Coronary angioplasty status: Secondary | ICD-10-CM

## 2020-05-08 DIAGNOSIS — I252 Old myocardial infarction: Secondary | ICD-10-CM | POA: Diagnosis not present

## 2020-05-08 DIAGNOSIS — I2102 ST elevation (STEMI) myocardial infarction involving left anterior descending coronary artery: Secondary | ICD-10-CM

## 2020-05-10 ENCOUNTER — Telehealth (HOSPITAL_COMMUNITY): Payer: Self-pay | Admitting: Internal Medicine

## 2020-05-10 ENCOUNTER — Encounter (HOSPITAL_COMMUNITY): Payer: Medicare Other

## 2020-05-12 ENCOUNTER — Encounter (HOSPITAL_COMMUNITY)
Admission: RE | Admit: 2020-05-12 | Discharge: 2020-05-12 | Disposition: A | Discharge status: Home / Self Care | Payer: Medicare Other | Source: Ambulatory Visit | Attending: Cardiovascular Disease | Admitting: Cardiovascular Disease

## 2020-05-12 ENCOUNTER — Other Ambulatory Visit: Payer: Self-pay

## 2020-05-12 VITALS — Ht 71.0 in | Wt 193.3 lb

## 2020-05-12 DIAGNOSIS — I252 Old myocardial infarction: Secondary | ICD-10-CM | POA: Diagnosis not present

## 2020-05-12 DIAGNOSIS — I2102 ST elevation (STEMI) myocardial infarction involving left anterior descending coronary artery: Secondary | ICD-10-CM

## 2020-05-12 DIAGNOSIS — I2510 Atherosclerotic heart disease of native coronary artery without angina pectoris: Secondary | ICD-10-CM

## 2020-05-15 NOTE — Progress Notes (Signed)
Discharge Progress Report ° °Patient Details  °John Mccarthy: John John Mccarthy °MRN: [id number] °Date of Birth: 08/13/1951 °Referring Provider:   °  CARDIAC REHAB PHASE II ORIENTATION from 03/14/2020 in [facility]  °Referring Provider Mccarthy, John J, MD  °  ° ° ° °Number of Visits: 18 ° °Reason for Discharge:  °Patient reached a stable level of exercise. °Patient independent in their exercise. °Patient has met program and personal goals. ° °Smoking History:  °Social History  ° °Tobacco Use  °Smoking Status Never Smoker  °Smokeless Tobacco Never Used  ° ° °Diagnosis:  °ST elevation myocardial infarction involving left anterior descending (LAD) coronary artery (HCC) ° °CAD S/P percutaneous coronary angioplasty ° °ADL [facility]: ° ° °Initial Exercise Prescription: ° Initial Exercise Prescription - 03/14/20 1100   °  ° Date of Initial Exercise RX and Referring Provider  ° Date 03/14/20   ° Referring Provider Mccarthy, John J, MD   ° Expected Discharge Date 05/12/20   °  ° Bike  ° Level 3   ° Minutes 15   ° METs 3.2   °  ° NuStep  ° Level 4   ° SPM 85   ° Minutes 15   ° METs 3.2   °  ° Prescription Details  ° Frequency (times per week) 3   ° Duration Progress to 30 minutes of continuous aerobic without signs/symptoms of physical distress   °  ° Intensity  ° Ratings of Perceived Exertion 11-13   ° Perceived Dyspnea 0-4   °  ° Progression  ° Progression Continue to progress workloads to maintain intensity without signs/symptoms of physical distress.   °  ° Resistance Training  ° Training Prescription Yes   ° Weight 5   ° Reps 10-15   °  °  °  ° ° °Discharge Exercise Prescription (Final Exercise Prescription Changes): ° Exercise Prescription Changes - 05/12/20 1336   °  ° Response to Exercise  ° Blood Pressure (Admit) 118/64   ° Blood Pressure (Exercise) 108/52   ° Blood Pressure (Exit) 108/56   ° Heart Rate (Admit) 65 bpm   ° Heart Rate (Exercise) 105 bpm   ° Heart Rate (Exit) 69 bpm   ° Rating of  Perceived Exertion (Exercise) 12   ° Perceived Dyspnea (Exercise) 0   ° Symptoms None   ° Comments Pt's last day of exercise   ° Duration Progress to 45 minutes of aerobic exercise without signs/symptoms of physical distress   ° Intensity THRR unchanged   °  ° Progression  ° Progression Continue to progress workloads to maintain intensity without signs/symptoms of physical distress.   ° Average METs 5.3   °  ° Resistance Training  ° Training Prescription No   °  ° Interval Training  ° Interval Training No   °  ° Bike  ° Level 5   ° Minutes 15   ° METs 6.9   °  ° NuStep  ° Level 5   ° SPM 105   ° Minutes 15   ° METs 2.4   °  ° Elliptical  ° Level 1   ° Speed 1   ° Minutes 10   ° METs 6.7   °  ° Home Exercise Plan  ° Plans to continue exercise at Home (comment)   Walking, Tennis  ° Frequency Add 4 additional days to program exercise sessions.   ° Initial Home Exercises Provided 04/12/20   °  °  °  ° ° °  Functional Capacity: ° 6 Minute Walk   ° Row John Mccarthy 03/14/20 0921 05/18/20 1446  °  °  ° 6 Minute Walk  ° Phase Initial Discharge   ° Distance 1834 feet 1960 feet   ° Distance % Change -- 6.87 %   ° Distance Feet Change -- 126 ft   ° Walk Time 6 minutes 6 minutes   ° # of Rest Breaks 0 0   ° MPH 3.47 3.71   ° METS 3.84 4.1   ° RPE 11 11   ° Perceived Dyspnea  0 0   ° VO2 Peak 13.45 14.3   ° Symptoms No No   ° Resting HR 67 bpm 76 bpm   ° Resting BP 100/56 100/54   ° Resting Oxygen Saturation  98 % --   ° Exercise Oxygen Saturation  during 6 min walk 96 % --   ° Max Ex. HR 105 bpm 102 bpm   ° Max Ex. BP 128/72 120/60   ° 2 Minute Post BP 138/80 100/62   °  °  °  ° ° °Psychological, QOL, Others - Outcomes: °PHQ 2/9: °Depression screen PHQ 2/9 05/12/2020 03/14/2020 09/16/2019 07/06/2019 07/09/2018  °Decreased Interest 0 0 0 0 0  °Down, Depressed, Hopeless 0 0 0 0 0  °PHQ - 2 Score 0 0 0 0 0  °Altered sleeping - 2 3 - -  °Tired, decreased energy - 1 3 - -  °Change in appetite - 0 1 - -  °Feeling bad or failure about yourself  - 0 0  - -  °Trouble concentrating - 0 0 - -  °Moving slowly or fidgety/restless - 0 3 - -  °Suicidal thoughts - 0 0 - -  °PHQ-9 Score - 3 10 - -  °Difficult doing work/chores - Not difficult at all - - -  ° ° °Quality of Life: ° Quality of Life - 05/18/20 1438   °  ° Quality of Life Scores  ° Health/Function Pre 21.86 %   ° Health/Function Post 28.4 %   ° Health/Function % Change 29.92 %   ° Socioeconomic Pre 30 %   ° Socioeconomic Post 30 %   ° Socioeconomic % Change  0 %   ° Psych/Spiritual Pre 26 %   ° Psych/Spiritual Post 28.93 %   ° Psych/Spiritual % Change 11.27 %   ° Family Pre 30 %   ° Family Post 30 %   ° Family % Change 0 %   ° GLOBAL Pre 25.4 %   ° GLOBAL Post 29.05 %   ° GLOBAL % Change 14.37 %   °  °  °  ° ° °Personal Goals: °Goals established at orientation with interventions provided to work toward goal. ° Personal Goals and Risk Factors at Admission - 03/14/20 0913   °  ° Core Components/Risk Factors/Patient Goals on Admission  ° Lipids Yes   ° Intervention Provide education and support for participant on nutrition & aerobic/resistive exercise along with prescribed medications to achieve LDL <70mg, HDL >40mg.   ° Expected Outcomes Short Term: Participant states understanding of desired cholesterol values and is compliant with medications prescribed. Participant is following exercise prescription and nutrition guidelines.;Long Term: Cholesterol controlled with medications as prescribed, with individualized exercise RX and with personalized nutrition plan. Value goals: LDL < 70mg, HDL > 40 mg.   °  °  °  °  ° °Personal Goals Discharge: ° Goals and Risk Factor Review   °   John John Mccarthy 03/21/20 3354 04/04/20 1334 05/03/20 1423 05/26/20 1609       Core Components/Risk Factors/Patient Goals Review   Personal Goals Review Weight Management/Obesity;Lipids Weight Management/Obesity;Lipids Weight Management/Obesity;Lipids Weight Management/Obesity;Lipids    Review John John Mccarthy started exercise on 03/21/20 and did well  with exercise. Will continue to monitor BP's at home as John John Mccarthy says sometimes he gets low systolic bP in the 56'Y John John Mccarthy is doing well with exercise at cardiac rehab. John John Mccarthy's systolic BP's remains in the mid 90's to low 100 resting. Patient remains asymptomatic. Dr Gwenlyn Found is aware John John Mccarthy continues to do well with exercise at cardiac rehab.John John Mccarthy's vital signs have been stable. John John Mccarthy continues to do well with exercise at cardiac rehab.John John Mccarthy's vital signs have been stable.    Expected Outcomes John John Mccarthy will continue to participate in phase 2 cardiac rehab for exercise, nutrtion and lifestyle modifications John John Mccarthy will continue to participate in phase 2 cardiac rehab for exercise, nutrtion and lifestyle modifications John John Mccarthy will continue to participate in phase 2 cardiac rehab for exercise, nutrtion and lifestyle modifications. John John Mccarthy will complete exercise at cardiac rehab next week. John John Mccarthy completed exercise on 05/12/20 and plans to continue exercise by walking 3-4 days a week           Exercise Goals and Review:  Exercise Goals    Row John Mccarthy 03/14/20 0913             Exercise Goals   Increase Physical Activity Yes       Intervention Provide advice, education, support and counseling about physical activity/exercise needs.;Develop an individualized exercise prescription for aerobic and resistive training based on initial evaluation findings, risk stratification, comorbidities and participant's personal goals.       Expected Outcomes Short Term: Attend rehab on a regular basis to increase amount of physical activity.;Long Term: Exercising regularly at least 3-5 days a week.;Long Term: Add in home exercise to make exercise part of routine and to increase amount of physical activity.       Increase Strength and Stamina Yes       Intervention Provide advice, education, support and counseling about physical activity/exercise needs.;Develop an individualized exercise prescription for aerobic and  resistive training based on initial evaluation findings, risk stratification, comorbidities and participant's personal goals.       Expected Outcomes Short Term: Increase workloads from initial exercise prescription for resistance, speed, and METs.;Short Term: Perform resistance training exercises routinely during rehab and add in resistance training at home;Long Term: Improve cardiorespiratory fitness, muscular endurance and strength as measured by increased METs and functional capacity (6MWT)       Able to understand and use rate of perceived exertion (RPE) scale Yes       Intervention Provide education and explanation on how to use RPE scale       Expected Outcomes Short Term: Able to use RPE daily in rehab to express subjective intensity level;Long Term:  Able to use RPE to guide intensity level when exercising independently       Knowledge and understanding of Target Heart Rate Range (THRR) Yes       Intervention Provide education and explanation of THRR including how the numbers were predicted and where they are located for reference       Expected Outcomes Short Term: Able to state/look up THRR;Long Term: Able to use THRR to govern intensity when exercising independently;Short Term: Able to use daily as guideline for intensity in rehab       Able to check pulse independently Yes  Intervention Provide education and demonstration on how to check pulse in carotid and radial arteries.;Review the importance of being able to check your own pulse for safety during independent exercise      ° Expected Outcomes Short Term: Able to explain why pulse checking is important during independent exercise;Long Term: Able to check pulse independently and accurately      ° Understanding of Exercise Prescription Yes      ° Intervention Provide education, explanation, and written materials on patient's individual exercise prescription      ° Expected Outcomes Short Term: Able to explain program exercise  prescription;Long Term: Able to explain home exercise prescription to exercise independently      °  °  °  ° ° °Exercise Goals Re-Evaluation: ° Exercise Goals Re-Evaluation   ° Row John Mccarthy 04/12/20 1005 05/04/20 1338 05/18/20 1501  °  °  °  ° Exercise Goal Re-Evaluation  ° Exercise Goals Review Increase Physical Activity;Increase Strength and Stamina;Able to understand and use rate of perceived exertion (RPE) scale;Knowledge and understanding of Target Heart Rate Range (THRR);Able to check pulse independently;Understanding of Exercise Prescription Increase Physical Activity;Increase Strength and Stamina;Able to understand and use rate of perceived exertion (RPE) scale;Knowledge and understanding of Target Heart Rate Range (THRR);Able to check pulse independently;Understanding of Exercise Prescription Increase Physical Activity;Increase Strength and Stamina;Able to understand and use rate of perceived exertion (RPE) scale;Knowledge and understanding of Target Heart Rate Range (THRR);Able to check pulse independently;Understanding of Exercise Prescription    ° Comments Pt voices walking for a hour 1-2 days/wk, but not consistantly. He walks up to 1 hour on these walks. Reviewed components of exercise prescription to hopefully gain more consistency in frequency and duration of exercise. Pt voices understnading of the above goals. Pt is continuing to increase cardiovascular endurance. Pt is able to exercise at higher exercise intensities for longer periods of time. Pt states he is feeling stronger. Will continue to progress workloads as tolerated. Pt completed 18 sessions of cardiac rehab. Pt increased functional capacity by 6.87%, increasing post 6mwt distance by 126ft. Pt's goal was to increase confidence with higher intensity exercises and now pt states he feels comfortable.    ° Expected Outcomes Pt will walk 2-4 days/week at home for 30-45 minutes and abide by the guidlines in the exercise prescription. Pt will  continue work towards to exercising at home 2-4 days a week for 30-45 minutes. Pt will continue to exercise 3-4 days a week, 30-45 minutes. Pt plans to walk for exercise and hopefully resume tennis.    °  °  °  ° ° °Nutrition & Weight - Outcomes: ° Pre Biometrics - 03/14/20 0932   °  ° Pre Biometrics  ° Waist Circumference 41.5 inches   ° Hip Circumference 40.5 inches   ° Waist to Hip Ratio 1.02 %   ° Triceps Skinfold 6 mm   ° % Body Fat 24.7 %   ° Grip Strength 41.5 kg   ° Flexibility 0 in   ° Single Leg Stand 2.31 seconds   °  °  °  ° ° Post Biometrics - 05/18/20 1448   °  °  Post  Biometrics  ° Height 5' 11" (1.803 m)   ° Weight 87.7 kg   ° Waist Circumference 41 inches   ° Hip Circumference 39 inches   ° Waist to Hip Ratio 1.05 %   ° BMI (Calculated) 26.98   ° Triceps Skinfold 6 mm   ° %   Body Fat 23.9 %   ° Grip Strength 41 kg   ° Flexibility 14 in   ° Single Leg Stand 30 seconds   °  °  °  ° ° °Nutrition: ° Nutrition Therapy & Goals - 03/22/20 1049   °  ° Nutrition Therapy  ° Diet Heart Healthy   °  ° Personal Nutrition Goals  ° Nutrition Goal Pt to identify and limit food sources of saturated fat, trans fat, refined carbohydrates and sodium   ° Personal Goal #2 Pt to build a healthy plate including vegetables, fruits, whole grains, and low-fat dairy products in a heart healthy meal plan.   °  ° Intervention Plan  ° Intervention Prescribe, educate and counsel regarding individualized specific dietary modifications aiming towards targeted core components such as weight, hypertension, lipid management, diabetes, heart failure and other comorbidities.;Nutrition handout(s) given to patient.   ° Expected Outcomes Short Term Goal: Understand basic principles of dietary content, such as calories, fat, sodium, cholesterol and nutrients.   °  °  °  ° ° °Nutrition Discharge: ° Nutrition Assessments - 05/25/20 1032   °  ° MEDFICTS Scores  ° Post Score 24   °  °  °  ° ° °Education Questionnaire Score: ° Knowledge  Questionnaire Score - 05/18/20 1439   °  ° Knowledge Questionnaire Score  ° Pre Score 24/24   ° Post Score 23/24   °  °  °  ° ° °Goals reviewed with patient; copy given to patient.Pt graduated from cardiac rehab program today with completion of 18 exercise sessions in Phase II. Pt maintained good attendance and progressed nicely during his participation in rehab as evidenced by increased MET level.   Medication list reconciled. Repeat  PHQ score- 0 Pt has made significant lifestyle changes and should be commended for his success. Pt feels he has achieved his goals during cardiac rehab.   Pt plans to continue exercise by walking. John John Mccarthy increased his distance on his post exercise walk test and lost 3 kg we are proud of John John Mccarthy's progress!John Whitaker, RN,BSN °05/26/2020 4:12 PM °

## 2020-06-03 ENCOUNTER — Other Ambulatory Visit: Payer: Self-pay | Admitting: Physician Assistant

## 2020-06-06 NOTE — Telephone Encounter (Signed)
This is Dr. Berry's pt 

## 2020-07-18 DIAGNOSIS — H5213 Myopia, bilateral: Secondary | ICD-10-CM | POA: Diagnosis not present

## 2020-07-18 DIAGNOSIS — H2513 Age-related nuclear cataract, bilateral: Secondary | ICD-10-CM | POA: Diagnosis not present

## 2020-07-18 DIAGNOSIS — H524 Presbyopia: Secondary | ICD-10-CM | POA: Diagnosis not present

## 2020-07-18 DIAGNOSIS — H52203 Unspecified astigmatism, bilateral: Secondary | ICD-10-CM | POA: Diagnosis not present

## 2020-08-30 ENCOUNTER — Other Ambulatory Visit: Payer: Self-pay | Admitting: Internal Medicine

## 2020-09-07 ENCOUNTER — Encounter: Payer: Self-pay | Admitting: Internal Medicine

## 2020-09-07 ENCOUNTER — Other Ambulatory Visit: Payer: Self-pay

## 2020-09-07 ENCOUNTER — Ambulatory Visit (INDEPENDENT_AMBULATORY_CARE_PROVIDER_SITE_OTHER): Payer: Medicare Other | Admitting: Internal Medicine

## 2020-09-07 VITALS — BP 120/78 | HR 68 | Temp 98.2°F | Ht 71.0 in | Wt 199.0 lb

## 2020-09-07 DIAGNOSIS — I255 Ischemic cardiomyopathy: Secondary | ICD-10-CM

## 2020-09-07 DIAGNOSIS — Z23 Encounter for immunization: Secondary | ICD-10-CM | POA: Diagnosis not present

## 2020-09-07 DIAGNOSIS — F4323 Adjustment disorder with mixed anxiety and depressed mood: Secondary | ICD-10-CM | POA: Diagnosis not present

## 2020-09-07 DIAGNOSIS — N400 Enlarged prostate without lower urinary tract symptoms: Secondary | ICD-10-CM | POA: Diagnosis not present

## 2020-09-07 DIAGNOSIS — E785 Hyperlipidemia, unspecified: Secondary | ICD-10-CM | POA: Diagnosis not present

## 2020-09-07 MED ORDER — SERTRALINE HCL 50 MG PO TABS: 50.0000 mg | ORAL_TABLET | Freq: Every day | ORAL | 3 refills | Status: DC

## 2020-09-07 NOTE — Progress Notes (Signed)
Subjective:  Patient ID: John Mccarthy, male    DOB: [birth year]  Age: 69 y.o. MRN: [id number]  CC: Hyperlipidemia  This visit occurred during the SARS-CoV-2 public health emergency.  Safety protocols were in place, including screening questions prior to the visit, additional usage of staff PPE, and extensive cleaning of exam room while observing appropriate contact time as indicated for disinfecting solutions.    HPI John Mccarthy presents for f/up - He wants to stay on the current dose of sertraline. He is active and denies any recent episodes of chest pain, shortness of breath, palpitations, edema, or fatigue.  Outpatient Medications Prior to Visit  Medication Sig Dispense Refill  . aspirin EC 81 MG tablet Take 1 tablet (81 mg total) by mouth daily. 90 tablet 3  . atorvastatin (LIPITOR) 80 MG tablet TAKE 1 TABLET BY MOUTH EVERY DAY AT 6 PM 90 tablet 3  . carvedilol (COREG) 6.25 MG tablet Take 1 tablet (6.25 mg total) by mouth 2 (two) times daily with a meal. 180 tablet 3  . clopidogrel (PLAVIX) 75 MG tablet Take 1 tablet (75 mg total) by mouth daily. 90 tablet 3  . Multiple Vitamins-Minerals (CENTRUM SILVER 50+MEN PO) Take 1 tablet by mouth daily.    . sertraline (ZOLOFT) 50 MG tablet Take 1 tablet (50 mg total) by mouth daily. 90 tablet 3  . nitroGLYCERIN (NITROSTAT) 0.4 MG SL tablet Place 1 tablet (0.4 mg total) under the tongue every 5 (five) minutes as needed for chest pain. 25 tablet 3  . Mccarthy 90 MG TABS tablet Take 90 mg by mouth 2 (two) times daily.     No facility-administered medications prior to visit.    ROS Review of Systems  Constitutional: Negative for appetite change, diaphoresis, fatigue and unexpected weight change.  HENT: Negative.   Eyes: Negative for visual disturbance.  Respiratory: Negative for cough, chest tightness, shortness of breath and wheezing.   Cardiovascular: Negative for chest pain, palpitations and leg swelling.  Gastrointestinal:  Negative for abdominal pain, constipation, diarrhea, nausea and vomiting.  Endocrine: Negative.   Genitourinary: Negative.  Negative for difficulty urinating.  Musculoskeletal: Negative for arthralgias and myalgias.  Skin: Negative.  Negative for color change and pallor.  Neurological: Negative.  Negative for dizziness, weakness, light-headedness and headaches.  Hematological: Negative for adenopathy. Does not bruise/bleed easily.  Psychiatric/Behavioral: Positive for dysphoric mood. Negative for sleep disturbance and suicidal ideas. The patient is not nervous/anxious.     Objective:  BP 120/78   Pulse 68   Temp 98.2 F (36.8 C) (Oral)   Ht 5\' 11"  (1.803 m)   Wt 199 lb (90.3 kg)   SpO2 98%   BMI 27.75 kg/m   BP Readings from Last 3 Encounters:  09/07/20 120/78  04/11/20 120/60  03/14/20 (!) 100/56    Wt Readings from Last 3 Encounters:  09/07/20 199 lb (90.3 kg)  05/18/20 193 lb 5.5 oz (87.7 kg)  04/11/20 196 lb 9.6 oz (89.2 kg)    Physical Exam HENT:     Nose: Nose normal.     Mouth/Throat:     Mouth: Mucous membranes are moist.  Eyes:     General: No scleral icterus.    Conjunctiva/sclera: Conjunctivae normal.  Cardiovascular:     Rate and Rhythm: Normal rate and regular rhythm.     Heart sounds: No murmur heard.   Pulmonary:     Effort: Pulmonary effort is normal.     Breath sounds: No stridor. No  wheezing, rhonchi or rales.  Abdominal:     General: Abdomen is flat. Bowel sounds are normal. There is no distension.     Palpations: Abdomen is soft. There is no hepatomegaly, splenomegaly or mass.     Tenderness: There is no abdominal tenderness.  Genitourinary:    Comments: He was not willing to undress for a GU/rectal exam. Musculoskeletal:        General: Normal range of motion.     Cervical back: Neck supple.     Right lower leg: No edema.     Left lower leg: No edema.  Lymphadenopathy:     Cervical: No cervical adenopathy.  Skin:    General: Skin is  warm and dry.  Neurological:     General: No focal deficit present.     Mental Status: He is alert. Mental status is at baseline.  Psychiatric:        Mood and Affect: Mood normal.        Behavior: Behavior normal.        Thought Content: Thought content normal.        Judgment: Judgment normal.     Lab Results  Component Value Date   WBC 8.8 09/08/2020   HGB 14.5 09/08/2020   HCT 42.7 09/08/2020   PLT 193.0 09/08/2020   GLUCOSE 100 (H) 09/08/2020   CHOL 109 09/08/2020   TRIG 102.0 09/08/2020   HDL 37.90 (L) 09/08/2020   LDLDIRECT 146.0 10/15/2016   LDLCALC 51 09/08/2020   ALT 37 10/08/2019   AST 33 10/08/2019   NA 142 09/08/2020   K 4.0 09/08/2020   CL 103 09/08/2020   CREATININE 1.05 09/08/2020   BUN 13 09/08/2020   CO2 31 09/08/2020   TSH 3.43 09/08/2020   PSA 0.58 09/08/2020   INR 1.3 (H) 06/29/2019   HGBA1C 5.5 10/15/2016    No results found.  Assessment & Plan:   John Mccarthy was seen today for hyperlipidemia.  Diagnoses and all orders for this visit:  Flu vaccine need -     Flu Vaccine QUAD High Dose(Fluad)  Benign prostatic hyperplasia without lower urinary tract symptoms- His PSA is low which is a reassuring sign that he does not have prostate cancer. -     PSA; Future  Dyslipidemia, goal LDL below 70- He has achieved his LDL goal and is doing well on the statin. -     Lipid panel; Future -     TSH; Future  Ischemic cardiomyopathy- He is well compensated with this. -     CBC with Differential/Platelet; Future -     Cancel: BASIC METABOLIC PANEL WITH GFR; Future  Adjustment disorder with mixed anxiety and depressed mood -     sertraline (ZOLOFT) 50 MG tablet; Take 1 tablet (50 mg total) by mouth daily.   I have discontinued John Mccarthy. I am also having him maintain his aspirin EC, Multiple Vitamins-Minerals (CENTRUM SILVER 50+MEN PO), nitroGLYCERIN, carvedilol, atorvastatin, clopidogrel, and sertraline.  Meds ordered this  encounter  Medications  . sertraline (ZOLOFT) 50 MG tablet    Sig: Take 1 tablet (50 mg total) by mouth daily.    Dispense:  90 tablet    Refill:  3     Follow-up: No follow-ups on file.  John Calico, MD

## 2020-09-08 ENCOUNTER — Other Ambulatory Visit (INDEPENDENT_AMBULATORY_CARE_PROVIDER_SITE_OTHER): Payer: Medicare Other

## 2020-09-08 DIAGNOSIS — N400 Enlarged prostate without lower urinary tract symptoms: Secondary | ICD-10-CM

## 2020-09-08 DIAGNOSIS — I255 Ischemic cardiomyopathy: Secondary | ICD-10-CM | POA: Diagnosis not present

## 2020-09-08 DIAGNOSIS — E785 Hyperlipidemia, unspecified: Secondary | ICD-10-CM

## 2020-09-08 LAB — CBC WITH DIFFERENTIAL/PLATELET
Basophils Absolute: 0.1 10*3/uL (ref 0.0–0.1)
Basophils Relative: 0.8 % (ref 0.0–3.0)
Eosinophils Absolute: 0.2 10*3/uL (ref 0.0–0.7)
Eosinophils Relative: 1.9 % (ref 0.0–5.0)
HCT: 42.7 % (ref 39.0–52.0)
Hemoglobin: 14.5 g/dL (ref 13.0–17.0)
Lymphocytes Relative: 20.6 % (ref 12.0–46.0)
Lymphs Abs: 1.8 10*3/uL (ref 0.7–4.0)
MCHC: 34 g/dL (ref 30.0–36.0)
MCV: 90.1 fl (ref 78.0–100.0)
Monocytes Absolute: 0.7 10*3/uL (ref 0.1–1.0)
Monocytes Relative: 8.5 % (ref 3.0–12.0)
Neutro Abs: 6 10*3/uL (ref 1.4–7.7)
Neutrophils Relative %: 68.2 % (ref 43.0–77.0)
Platelets: 193 10*3/uL (ref 150.0–400.0)
RBC: 4.74 Mil/uL (ref 4.22–5.81)
RDW: 14.4 % (ref 11.5–15.5)
WBC: 8.8 10*3/uL (ref 4.0–10.5)

## 2020-09-08 LAB — LIPID PANEL
Cholesterol: 109 mg/dL (ref 0–200)
HDL: 37.9 mg/dL — ABNORMAL LOW (ref >39.00)
LDL Cholesterol: 51 mg/dL (ref 0–99)
NonHDL: 71.24
Total CHOL/HDL Ratio: 3
Triglycerides: 102 mg/dL (ref 0.0–149.0)
VLDL: 20.4 mg/dL (ref 0.0–40.0)

## 2020-09-08 LAB — BASIC METABOLIC PANEL
BUN: 13 mg/dL (ref 6–23)
CO2: 31 mEq/L (ref 19–32)
Calcium: 9.3 mg/dL (ref 8.4–10.5)
Chloride: 103 mEq/L (ref 96–112)
Creatinine, Ser: 1.05 mg/dL (ref 0.40–1.50)
GFR: 70.91 mL/min (ref >60.00)
Glucose, Bld: 100 mg/dL — ABNORMAL HIGH (ref 70–99)
Potassium: 4 mEq/L (ref 3.5–5.1)
Sodium: 142 mEq/L (ref 135–145)

## 2020-09-08 LAB — TSH: TSH: 3.43 u[IU]/mL (ref 0.35–4.50)

## 2020-09-08 LAB — PSA: PSA: 0.58 ng/mL (ref 0.10–4.00)

## 2020-09-09 ENCOUNTER — Other Ambulatory Visit: Payer: Self-pay | Admitting: Internal Medicine

## 2020-09-09 ENCOUNTER — Encounter: Payer: Self-pay | Admitting: Internal Medicine

## 2020-09-09 DIAGNOSIS — Z1211 Encounter for screening for malignant neoplasm of colon: Secondary | ICD-10-CM | POA: Insufficient documentation

## 2020-09-09 DIAGNOSIS — Z23 Encounter for immunization: Secondary | ICD-10-CM | POA: Insufficient documentation

## 2020-09-09 MED ORDER — SHINGRIX 50 MCG/0.5ML IM SUSR: 0.5000 mL | Freq: Once | INTRAMUSCULAR | 1 refills | Status: AC

## 2020-09-09 NOTE — Patient Instructions (Signed)
Major Depressive Disorder, Adult Major depressive disorder (MDD) is a mental health condition. It may also be called clinical depression or unipolar depression. MDD usually causes feelings of sadness, hopelessness, or helplessness. MDD can also cause physical symptoms. It can interfere with work, school, relationships, and other everyday activities. MDD may be mild, moderate, or severe. It may occur once (single episode major depressive disorder) or it may occur multiple times (recurrent major depressive disorder). What are the causes? The exact cause of this condition is not known. MDD is most likely caused by a combination of things, which may include:  Genetic factors. These are traits that are passed along from parent to child.  Individual factors. Your personality, your behavior, and the way you handle your thoughts and feelings may contribute to MDD. This includes personality traits and behaviors learned from others.  Physical factors, such as: ? Differences in the part of your brain that controls emotion. This part of your brain may be different than it is in people who do not have MDD. ? Long-term (chronic) medical or psychiatric illnesses.  Social factors. Traumatic experiences or major life changes may play a role in the development of MDD. What increases the risk? This condition is more likely to develop in women. The following factors may also make you more likely to develop MDD:  A family history of depression.  Troubled family relationships.  Abnormally low levels of certain brain chemicals.  Traumatic events in childhood, especially abuse or the loss of a parent.  Being under a lot of stress, or long-term stress, especially from upsetting life experiences or losses.  A history of: ? Chronic physical illness. ? Other mental health disorders. ? Substance abuse.  Poor living conditions.  Experiencing social exclusion or discrimination on a regular basis. What are the  signs or symptoms? The main symptoms of MDD typically include:  Constant depressed or irritable mood.  Loss of interest in things and activities. MDD symptoms may also include:  Sleeping or eating too much or too little.  Unexplained weight change.  Fatigue or low energy.  Feelings of worthlessness or guilt.  Difficulty thinking clearly or making decisions.  Thoughts of suicide or of harming others.  Physical agitation or weakness.  Isolation. Severe cases of MDD may also occur with other symptoms, such as:  Delusions or hallucinations, in which you imagine things that are not real (psychotic depression).  Low-level depression that lasts at least a year (chronic depression or persistent depressive disorder).  Extreme sadness and hopelessness (melancholic depression).  Trouble speaking and moving (catatonic depression). How is this diagnosed? This condition may be diagnosed based on:  Your symptoms.  Your medical history, including your mental health history. This may involve tests to evaluate your mental health. You may be asked questions about your lifestyle, including any drug and alcohol use, and how long you have had symptoms of MDD.  A physical exam.  Blood tests to rule out other conditions. You must have a depressed mood and at least four other MDD symptoms most of the day, nearly every day in the same 2-week timeframe before your health care provider can confirm a diagnosis of MDD. How is this treated? This condition is usually treated by mental health professionals, such as psychologists, psychiatrists, and clinical social workers. You may need more than one type of treatment. Treatment may include:  Psychotherapy. This is also called talk therapy or counseling. Types of psychotherapy include: ? Cognitive behavioral therapy (CBT). This type of therapy   teaches you to recognize unhealthy feelings, thoughts, and behaviors, and replace them with positive thoughts  and actions. ? Interpersonal therapy (IPT). This helps you to improve the way you relate to and communicate with others. ? Family therapy. This treatment includes members of your family.  Medicine to treat anxiety and depression, or to help you control certain emotions and behaviors.  Lifestyle changes, such as: ? Limiting alcohol and drug use. ? Exercising regularly. ? Getting plenty of sleep. ? Making healthy eating choices. ? Spending more time outdoors.  Treatments involving stimulation of the brain can be used in situations with extremely severe symptoms, or when medicine or other therapies do not work over time. These treatments include electroconvulsive therapy, transcranial magnetic stimulation, and vagal nerve stimulation. Follow these instructions at home: Activity  Return to your normal activities as told by your health care provider.  Exercise regularly and spend time outdoors as told by your health care provider. General instructions  Take over-the-counter and prescription medicines only as told by your health care provider.  Do not drink alcohol. If you drink alcohol, limit your alcohol intake to no more than 1 drink a day for nonpregnant women and 2 drinks a day for men. One drink equals 12 oz of beer, 5 oz of wine, or 1 oz of hard liquor. Alcohol can affect any antidepressant medicines you are taking. Talk to your health care provider about your alcohol use.  Eat a healthy diet and get plenty of sleep.  Find activities that you enjoy doing, and make time to do them.  Consider joining a support group. Your health care provider may be able to recommend a support group.  Keep all follow-up visits as told by your health care provider. This is important. Where to find more information National Alliance on Mental Illness  www.nami.org [place] National Institute of Mental Health  www.nimh.nih.gov National Suicide Prevention Lifeline  1-800-273-TALK (8255). This is  free, 24-hour help. Contact a health care provider if:  Your symptoms get worse.  You develop new symptoms. Get help right away if:  You self-harm.  You have serious thoughts about hurting yourself or others.  You see, hear, taste, smell, or feel things that are not present (hallucinate). This information is not intended to replace advice given to you by your health care provider. Make sure you discuss any questions you have with your health care provider. Document Revised: 10/31/2017 Document Reviewed: 05/29/2016 Elsevier Patient Education  2020 Elsevier Inc.  

## 2020-09-28 ENCOUNTER — Other Ambulatory Visit: Payer: Self-pay | Admitting: Cardiovascular Disease

## 2020-10-15 ENCOUNTER — Ambulatory Visit: Payer: Medicare Other | Attending: Internal Medicine

## 2020-10-15 DIAGNOSIS — Z23 Encounter for immunization: Secondary | ICD-10-CM

## 2020-10-15 NOTE — Progress Notes (Signed)
° °  [facility]ov[facility]  Name:  John Mccarthy    MRN: [id number] DOB: 11/30/1951  10/15/2020  [name] was observed post Covid-19 immunization for 15 minutes without incident. He was provided with Vaccine Information Sheet and instruction to access the V-Safe system.   [name] was instructed to call 911 with any severe reactions post vaccine:  Difficulty breathing   Swelling of face and throat   A fast heartbeat   A bad rash all over body   Dizziness and weakness   Immunizations Administered    Name Date Dose VIS Date [place] COVID-19 Vaccine 10/15/2020  9:23 AM 0.3 mL 09/20/2020 Intramuscular   Manufacturer: [employer]   Lot: [id number]   [place]: [id number]

## 2021-01-11 ENCOUNTER — Other Ambulatory Visit: Payer: Self-pay | Admitting: Physician Assistant

## 2021-01-11 NOTE — Telephone Encounter (Signed)
This is Dr. Berry's pt 

## 2021-02-07 ENCOUNTER — Other Ambulatory Visit: Payer: Self-pay

## 2021-02-07 ENCOUNTER — Ambulatory Visit (INDEPENDENT_AMBULATORY_CARE_PROVIDER_SITE_OTHER): Payer: Medicare Other

## 2021-02-07 VITALS — BP 100/70 | HR 59 | Temp 98.2°F | Ht 71.0 in | Wt 204.2 lb

## 2021-02-07 DIAGNOSIS — Z0000 Encounter for general adult medical examination without abnormal findings: Secondary | ICD-10-CM

## 2021-02-07 DIAGNOSIS — I2102 ST elevation (STEMI) myocardial infarction involving left anterior descending coronary artery: Secondary | ICD-10-CM | POA: Diagnosis not present

## 2021-02-07 NOTE — Patient Instructions (Addendum)
John Mccarthy , Thank you for taking time to come for your Medicare Wellness Visit. I appreciate your ongoing commitment to your health goals. Please review the following plan we discussed and let me know if I can assist you in the future.   Screening recommendations/referrals: Colonoscopy: 11/24/2008; due every 10 years Recommended yearly ophthalmology/optometry visit for glaucoma screening and checkup Recommended yearly dental visit for hygiene and checkup  Vaccinations: Influenza vaccine: 09/07/2020 Pneumococcal vaccine: 05/31/2015, 12/14/2018 Tdap vaccine: 07/06/2019; due every 10 years Shingles vaccine:  11/2021, 01/16/2021 ([facility]) Covid-19: 01/09/2020, 02/02/2020, 10/15/2020  Advanced directives: Please bring a copy of your health care power of attorney and living will to the office at your convenience.  Conditions/risks identified: Yes; Reviewed health maintenance screenings with patient today and relevant education, vaccines, and/or referrals were provided. Please continue to do your personal lifestyle choices by: daily care of teeth and gums, regular physical activity (goal should be 5 days a week for 30 minutes), eat a healthy diet, avoid tobacco and drug use, limiting any alcohol intake, taking a low-dose aspirin (if not allergic or have been advised by your provider otherwise) and taking vitamins and minerals as recommended by your provider. Continue doing brain stimulating activities (puzzles, reading, adult coloring books, staying active) to keep memory sharp. Continue to eat heart healthy diet (full of fruits, vegetables, whole grains, lean protein, water--limit salt, fat, and sugar intake) and increase physical activity as tolerated.  Next appointment: Please schedule your next Medicare Wellness Visit with your Nurse Health Advisor in 1 year by calling [phone].  Preventive Care 72 Years and Older, Male Preventive care refers to lifestyle choices and visits with your health care  provider that can promote health and wellness. What does preventive care include?  A yearly physical exam. This is also called an annual well check.  Dental exams once or twice a year.  Routine eye exams. Ask your health care provider how often you should have your eyes checked.  Personal lifestyle choices, including:  Daily care of your teeth and gums.  Regular physical activity.  Eating a healthy diet.  Avoiding tobacco and drug use.  Limiting alcohol use.  Practicing safe sex.  Taking low doses of aspirin every day.  Taking vitamin and mineral supplements as recommended by your health care provider. What happens during an annual well check? The services and screenings done by your health care provider during your annual well check will depend on your age, overall health, lifestyle risk factors, and family history of disease. Counseling  Your health care provider may ask you questions about your:  Alcohol use.  Tobacco use.  Drug use.  Emotional well-being.  Home and relationship well-being.  Sexual activity.  Eating habits.  History of falls.  Memory and ability to understand (cognition).  Work and work Statistician. Screening  You may have the following tests or measurements:  Height, weight, and BMI.  Blood pressure.  Lipid and cholesterol levels. These may be checked every 5 years, or more frequently if you are over 72 years old.  Skin check.  Lung cancer screening. You may have this screening every year starting at age 72 if you have a 30-pack-year history of smoking and currently smoke or have quit within the past 15 years.  Fecal occult blood test (FOBT) of the stool. You may have this test every year starting at age 72.  Flexible sigmoidoscopy or colonoscopy. You may have a sigmoidoscopy every 5 years or a colonoscopy every 10 years starting  at age 72.  Prostate cancer screening. Recommendations will vary depending on your family history and  other risks.  Hepatitis C blood test.  Hepatitis B blood test.  Sexually transmitted disease (STD) testing.  Diabetes screening. This is done by checking your blood sugar (glucose) after you have not eaten for a while (fasting). You may have this done every 1-3 years.  Abdominal aortic aneurysm (AAA) screening. You may need this if you are a current or former smoker.  Osteoporosis. You may be screened starting at age 72 if you are at high risk. Talk with your health care provider about your test results, treatment options, and if necessary, the need for more tests. Vaccines  Your health care provider may recommend certain vaccines, such as:  Influenza vaccine. This is recommended every year.  Tetanus, diphtheria, and acellular pertussis (Tdap, Td) vaccine. You may need a Td booster every 10 years.  Zoster vaccine. You may need this after age 72.  Pneumococcal 13-valent conjugate (PCV13) vaccine. One dose is recommended after age 72.  Pneumococcal polysaccharide (PPSV23) vaccine. One dose is recommended after age 72. Talk to your health care provider about which screenings and vaccines you need and how often you need them. This information is not intended to replace advice given to you by your health care provider. Make sure you discuss any questions you have with your health care provider. Document Released: 12/15/2015 Document Revised: 08/07/2016 Document Reviewed: 09/19/2015 Elsevier Interactive Patient Education  2017 [place] Prevention in the Home Falls can cause injuries. They can happen to people of all ages. There are many things you can do to make your home safe and to help prevent falls. What can I do on the outside of my home?  Regularly fix the edges of walkways and driveways and fix any cracks.  Remove anything that might make you trip as you walk through a door, such as a raised step or threshold.  Trim any bushes or trees on the path to your  home.  Use bright outdoor lighting.  Clear any walking paths of anything that might make someone trip, such as rocks or tools.  Regularly check to see if handrails are loose or broken. Make sure that both sides of any steps have handrails.  Any raised decks and porches should have guardrails on the edges.  Have any leaves, snow, or ice cleared regularly.  Use sand or salt on walking paths during winter.  Clean up any spills in your garage right away. This includes oil or grease spills. What can I do in the bathroom?  Use night lights.  Install grab bars by the toilet and in the tub and shower. Do not use towel bars as grab bars.  Use non-skid mats or decals in the tub or shower.  If you need to sit down in the shower, use a plastic, non-slip stool.  Keep the floor dry. Clean up any water that spills on the floor as soon as it happens.  Remove soap buildup in the tub or shower regularly.  Attach bath mats securely with double-sided non-slip rug tape.  Do not have throw rugs and other things on the floor that can make you trip. What can I do in the bedroom?  Use night lights.  Make sure that you have a light by your bed that is easy to reach.  Do not use any sheets or blankets that are too big for your bed. They should not hang down onto  the floor.  Have a firm chair that has side arms. You can use this for support while you get dressed.  Do not have throw rugs and other things on the floor that can make you trip. What can I do in the kitchen?  Clean up any spills right away.  Avoid walking on wet floors.  Keep items that you use a lot in easy-to-reach places.  If you need to reach something above you, use a strong step stool that has a grab bar.  Keep electrical cords out of the way.  Do not use floor polish or wax that makes floors slippery. If you must use wax, use non-skid floor wax.  Do not have throw rugs and other things on the floor that can make you  trip. What can I do with my stairs?  Do not leave any items on the stairs.  Make sure that there are handrails on both sides of the stairs and use them. Fix handrails that are broken or loose. Make sure that handrails are as long as the stairways.  Check any carpeting to make sure that it is firmly attached to the stairs. Fix any carpet that is loose or worn.  Avoid having throw rugs at the top or bottom of the stairs. If you do have throw rugs, attach them to the floor with carpet tape.  Make sure that you have a light switch at the top of the stairs and the bottom of the stairs. If you do not have them, ask someone to add them for you. What else can I do to help prevent falls?  Wear shoes that:  Do not have high heels.  Have rubber bottoms.  Are comfortable and fit you well.  Are closed at the toe. Do not wear sandals.  If you use a stepladder:  Make sure that it is fully opened. Do not climb a closed stepladder.  Make sure that both sides of the stepladder are locked into place.  Ask someone to hold it for you, if possible.  Clearly mark and make sure that you can see:  Any grab bars or handrails.  First and last steps.  Where the edge of each step is.  Use tools that help you move around (mobility aids) if they are needed. These include:  Canes.  Walkers.  Scooters.  Crutches.  Turn on the lights when you go into a dark area. Replace any light bulbs as soon as they burn out.  Set up your furniture so you have a clear path. Avoid moving your furniture around.  If any of your floors are uneven, fix them.  If there are any pets around you, be aware of where they are.  Review your medicines with your doctor. Some medicines can make you feel dizzy. This can increase your chance of falling. Ask your doctor what other things that you can do to help prevent falls. This information is not intended to replace advice given to you by your health care provider. Make  sure you discuss any questions you have with your health care provider. Document Released: 09/14/2009 Document Revised: 04/25/2016 Document Reviewed: 12/23/2014 Elsevier Interactive Patient Education  2017 [employer].

## 2021-02-07 NOTE — Progress Notes (Signed)
Subjective:   John Mccarthy is a 70 y.o. male who presents for Medicare Annual/Subsequent preventive examination.  Review of Systems    No ROS. Medicare Wellness Visit. Additional risk factors are reflected in social history. Cardiac Risk Factors include: advanced age (>14men, >48 women);dyslipidemia;male gender;family history of premature cardiovascular disease     Objective:    Today's Vitals   02/07/21 0816  BP: 100/70  Pulse: (!) 59  Temp: 98.2 F (36.8 C)  SpO2: 93%  Weight: 204 lb 3.2 oz (92.6 kg)  Height: 5\' 11"  (1.803 m)  PainSc: 0-No pain   Body mass index is 28.48 kg/m.  Advanced Directives 02/07/2021 03/14/2020 06/29/2019 04/08/2017 02/13/2017 10/16/2016 05/23/2015  Does Patient Have a Medical Advance Directive? No No No No No Yes No  Type of Advance Directive - - - - - Press photographer;Living will -  Does patient want to make changes to medical advance directive? - - - - - No - Patient declined -  Copy of [place] in Chart? - - - - - Yes -  Would patient like information on creating a medical advance directive? No - Patient declined Yes (MAU/Ambulatory/Procedural Areas - Information given) Yes (Inpatient - patient defers creating a medical advance directive and declines information at this time) No - Patient declined No - Patient declined - No - patient declined information  Pre-existing out of facility DNR order (yellow form or pink MOST form) - - - - - - -    Current Medications (verified) Outpatient Encounter Medications as of 02/07/2021  Medication Sig  . aspirin EC 81 MG tablet Take 1 tablet (81 mg total) by mouth daily.  Marland Kitchen atorvastatin (LIPITOR) 80 MG tablet TAKE 1 TABLET BY MOUTH EVERY DAY AT 6 PM  . carvedilol (COREG) 6.25 MG tablet TAKE 1 TABLET (6.25 MG TOTAL) BY MOUTH 2 (TWO) TIMES DAILY WITH A MEAL.  Marland Kitchen clopidogrel (PLAVIX) 75 MG tablet Take 1 tablet (75 mg total) by mouth daily.  . Multiple Vitamins-Minerals (CENTRUM  SILVER 50+MEN PO) Take 1 tablet by mouth daily.  . sertraline (ZOLOFT) 50 MG tablet Take 1 tablet (50 mg total) by mouth daily.  . nitroGLYCERIN (NITROSTAT) 0.4 MG SL tablet Place 1 tablet (0.4 mg total) under the tongue every 5 (five) minutes as needed for chest pain.   No facility-administered encounter medications on file as of 02/07/2021.    Allergies (verified) Crestor [rosuvastatin calcium] and Lactose intolerance (gi)   History: Past Medical History:  Diagnosis Date  . BPH (benign prostatic hyperplasia)   . CAD S/P percutaneous coronary angioplasty 2018, 2020   anterior STEMI twice- LAD DES  . GI bleeding 2013  . Hyperlipidemia   . Pulmonary embolism ([place]) 06/15/2013   after achilles tendon repair- Xarelto 6 months  . Skin cancer (melanoma) [facility])    Past Surgical History:  Procedure Laterality Date  . ACHILLES TENDON SURGERY Right 05/18/2013  . CARDIAC CATHETERIZATION    . CORONARY STENT INTERVENTION N/A 02/11/2017   Procedure: Coronary Stent Intervention;  Surgeon: Lorretta Harp, MD;  Location: [place] [facility];  Service: Cardiovascular;  Laterality: N/A;  . CORONARY/GRAFT ACUTE MI REVASCULARIZATION N/A 06/28/2019   Procedure: Coronary/Graft Acute MI Revascularization;  Surgeon: Lorretta Harp, MD;  Location: [place] [facility];  Service: Cardiovascular;  Laterality: N/A;  . ESOPHAGOGASTRODUODENOSCOPY  09/24/2012   Procedure: ESOPHAGOGASTRODUODENOSCOPY (EGD);  Surgeon: Lear Ng, MD;  Location: [facility];  Service: Endoscopy;  Laterality:  N/A;  . HAND SURGERY    . LEFT HEART CATH AND CORONARY ANGIOGRAPHY N/A 02/11/2017   Procedure: Left Heart Cath and Coronary Angiography;  Surgeon: Lorretta Harp, MD;  Location: [place] [facility];  Service: Cardiovascular;  Laterality: N/A;  . LEFT HEART CATH AND CORONARY ANGIOGRAPHY N/A 06/28/2019   Procedure: LEFT HEART CATH AND CORONARY ANGIOGRAPHY;  Surgeon: Lorretta Harp, MD;  Location: [place] [facility];   Service: Cardiovascular;  Laterality: N/A;  . NOSE SURGERY  08/11/2019   removal of melenoma    Family History  Problem Relation Age of Onset  . Heart failure Father   . Coronary artery disease Sister 64  . Cancer Neg Hx   . COPD Neg Hx   . Stroke Neg Hx   . Kidney disease Neg Hx   . Hypertension Neg Hx   . Hyperlipidemia Neg Hx   . Heart disease Neg Hx   . Hearing loss Neg Hx   . Early death Neg Hx   . Drug abuse Neg Hx   . Diabetes Neg Hx   . Depression Neg Hx    Social History   Socioeconomic History  . Marital status: Married    Spouse name: Not on file  . Number of children: Not on file  . Years of education: Not on file  . Highest education level: Not on file  Occupational History  . Not on file  Tobacco Use  . Smoking status: Never Smoker  . Smokeless tobacco: Never Used  Vaping Use  . Vaping Use: Never used  Substance and Sexual Activity  . Alcohol use: Yes    Alcohol/week: 5.0 standard drinks    Types: 5 Shots of liquor per week    Comment: occassional  . Drug use: No  . Sexual activity: Yes    Birth control/protection: None  Other Topics Concern  . Not on file  Social History Narrative  . Not on file   Social Determinants of Health   Financial Resource Strain: Low Risk   . Difficulty of Paying Living Expenses: Not hard at all  Food Insecurity: No Food Insecurity  . Worried About Charity fundraiser in the Last Year: Never true  . Ran Out of Food in the Last Year: Never true  Transportation Needs: No Transportation Needs  . Lack of Transportation (Medical): No  . Lack of Transportation (Non-Medical): No  Physical Activity: Inactive  . Days of Exercise per Week: 0 days  . Minutes of Exercise per Session: 0 min  Stress: No Stress Concern Present  . Feeling of Stress : Not at all  Social Connections: Socially Integrated  . Frequency of Communication with Friends and Family: More than three times a week  . Frequency of Social Gatherings with  Friends and Family: More than three times a week  . Attends Religious Services: More than 4 times per year  . Active Member of Clubs or Organizations: Yes  . Attends Archivist Meetings: More than 4 times per year  . Marital Status: Married    Tobacco Counseling Counseling given: Not Answered   Clinical Intake:  Pre-visit preparation completed: Yes  Pain : No/denies pain Pain Score: 0-No pain     BMI - recorded: 28.48 Nutritional Status: BMI 25 -29 Overweight Nutritional Risks: None Diabetes: No  How often do you need to have someone help you when you read instructions, pamphlets, or other written materials from your doctor or pharmacy?: 1 - Never  What is the last grade level you completed in school?: Master's Degree  Diabetic? no  Interpreter Needed?: No  Information entered by :: Mallerie Blok N. Persia Lintner, LPN.   Activities of Daily Living In your present state of health, do you have any difficulty performing the following activities: 02/07/2021  Hearing? Y  Vision? N  Difficulty concentrating or making decisions? N  Walking or climbing stairs? N  Dressing or bathing? N  Doing errands, shopping? N  Preparing Food and eating ? N  Using the Toilet? N  In the past six months, have you accidently leaked urine? N  Do you have problems with loss of bowel control? N  Managing your Medications? N  Managing your Finances? N  Housekeeping or managing your Housekeeping? N  Some recent data might be hidden    Patient Care Team: Janith Lima, MD as PCP - General (Internal Medicine) Lorretta Harp, MD as PCP - Cardiology (Cardiology)  Indicate any recent Medical Services you may have received from other than [facility] providers in the past year (date may be approximate).     Assessment:   This is a routine wellness examination for Anuj.  Hearing/Vision screen No exam data present  Dietary issues and exercise activities discussed: Current Exercise Habits:  The patient does not participate in regular exercise at present, Exercise limited by: cardiac condition(s)  Goals   None    Depression Screen PHQ 2/9 Scores 02/07/2021 05/12/2020 03/14/2020 09/16/2019 07/06/2019 07/09/2018 04/14/2017  PHQ - 2 Score 0 0 0 0 0 0 0  PHQ- 9 Score - - 3 10 - - -    Fall Risk Fall Risk  02/07/2021 03/14/2020 10/27/2019 07/09/2018 04/08/2017  Falls in the past year? 1 0 0 No No  Comment - - Emmi Telephone Survey: data to providers prior to load - -  Number falls in past yr: 0 - - - -  Injury with Fall? 0 - - - -  Risk for fall due to : No Fall Risks Other (Comment) - - -  Risk for fall due to: Comment - Single leg stand less than 5 seconds. - - -  Follow up Falls evaluation completed Falls evaluation completed - - -    FALL RISK PREVENTION PERTAINING TO THE HOME:  Any stairs in or around the home? Yes  If so, are there any without handrails? No  Home free of loose throw rugs in walkways, pet beds, electrical cords, etc? Yes  Adequate lighting in your home to reduce risk of falls? Yes   ASSISTIVE DEVICES UTILIZED TO PREVENT FALLS:  Life alert? Yes  Use of a cane, walker or w/c? No  Grab bars in the bathroom? Yes  Shower chair or bench in shower? Yes  Elevated toilet seat or a handicapped toilet? Yes   TIMED UP AND GO:  Was the test performed? No .  Length of time to ambulate 10 feet: 0 sec.   Gait steady and fast without use of assistive device  Cognitive Function: Normal cognitive status assessed by direct observation by this Nurse Health Advisor. No abnormalities found.          Immunizations Immunization History  Administered Date(s) Administered  . Fluad Quad(high Dose 65+) 07/27/2019, 09/07/2020  . Influenza Split 09/25/2012  . Influenza, High Dose Seasonal PF 10/15/2016, 10/16/2017, 09/09/2018  . Influenza,inj,Quad PF,6+ Mos 10/18/2013  . PFIZER(Purple Top)SARS-COV-2 Vaccination 01/09/2020, 02/02/2020, 10/15/2020  . Pneumococcal Conjugate-13  05/31/2015  . Pneumococcal Polysaccharide-23 06/18/2013, 12/14/2018  .  Td 11/15/2008  . Tdap 07/06/2019  . Zoster 07/16/2013    TDAP status: Up to date  Flu Vaccine status: Up to date  Pneumococcal vaccine status: Up to date  Covid-19 vaccine status: Completed vaccines  Qualifies for Shingles Vaccine? Yes   Zostavax completed Yes   Shingrix Completed?: Yes  Screening Tests Health Maintenance  Topic Date Due  . COLONOSCOPY (Pts 45-79yrs Insurance coverage will need to be confirmed)  11/24/2018  . TETANUS/TDAP  07/05/2029  . INFLUENZA VACCINE  Completed  . COVID-19 Vaccine  Completed  . Hepatitis C Screening  Completed  . PNA vac Low Risk Adult  Completed  . HPV VACCINES  Aged Out    Health Maintenance  Health Maintenance Due  Topic Date Due  . COLONOSCOPY (Pts 45-101yrs Insurance coverage will need to be confirmed)  11/24/2018    Colorectal cancer screening: Type of screening: Colonoscopy. Completed 11/24/2008. Repeat every 10 years  Lung Cancer Screening: (Low Dose CT Chest recommended if Age 18-80 years, 30 pack-year currently smoking OR have quit w/in 15years.) does not qualify.   Lung Cancer Screening Referral: no  Additional Screening:  Hepatitis C Screening: does qualify; Completed yes  Vision Screening: Recommended annual ophthalmology exams for early detection of glaucoma and other disorders of the eye. Is the patient up to date with their annual eye exam?  Yes  Who is the provider or what is the name of the office in which the patient attends annual eye exams? Marygrace Drought, MD. If pt is not established with a provider, would they like to be referred to a provider to establish care? No .   Dental Screening: Recommended annual dental exams for proper oral hygiene  Community Resource Referral / Chronic Care Management: CRR required this visit?  No   CCM required this visit?  No      Plan:     I have personally reviewed and noted the following in  the patient's chart:   . Medical and social history . Use of alcohol, tobacco or illicit drugs  . Current medications and supplements . Functional ability and status . Nutritional status . Physical activity . Advanced directives . List of other physicians . Hospitalizations, surgeries, and ER visits in previous 12 months . Vitals . Screenings to include cognitive, depression, and falls . Referrals and appointments  In addition, I have reviewed and discussed with patient certain preventive protocols, quality metrics, and best practice recommendations. A written personalized care plan for preventive services as well as general preventive health recommendations were provided to patient.     Sheral Flow, LPN   12/07/1094   Nurse Notes:  Medications reviewed with patient; no opioid use noted.

## 2021-03-29 ENCOUNTER — Telehealth: Payer: Self-pay | Admitting: *Deleted

## 2021-03-29 NOTE — Telephone Encounter (Signed)
A message was left, re: his follow up visit. 

## 2021-04-12 ENCOUNTER — Other Ambulatory Visit: Payer: Self-pay | Admitting: *Deleted

## 2021-04-12 DIAGNOSIS — E785 Hyperlipidemia, unspecified: Secondary | ICD-10-CM | POA: Diagnosis not present

## 2021-04-12 DIAGNOSIS — Z9861 Coronary angioplasty status: Secondary | ICD-10-CM | POA: Diagnosis not present

## 2021-04-12 DIAGNOSIS — I2510 Atherosclerotic heart disease of native coronary artery without angina pectoris: Secondary | ICD-10-CM

## 2021-04-12 DIAGNOSIS — I255 Ischemic cardiomyopathy: Secondary | ICD-10-CM | POA: Diagnosis not present

## 2021-04-12 LAB — LIPID PANEL
Chol/HDL Ratio: 3.3 ratio (ref 0.0–5.0)
Cholesterol, Total: 123 mg/dL (ref 100–199)
HDL: 37 mg/dL — ABNORMAL LOW (ref >39)
LDL Chol Calc (NIH): 65 mg/dL (ref 0–99)
Triglycerides: 118 mg/dL (ref 0–149)
VLDL Cholesterol Cal: 21 mg/dL (ref 5–40)

## 2021-04-12 LAB — HEPATIC FUNCTION PANEL
ALT: 22 IU/L (ref 0–44)
AST: 26 IU/L (ref 0–40)
Albumin: 4.3 g/dL (ref 3.7–4.7)
Alkaline Phosphatase: 93 IU/L (ref 44–121)
Bilirubin Total: 0.7 mg/dL (ref 0.0–1.2)
Bilirubin, Direct: 0.21 mg/dL (ref 0.00–0.40)
Total Protein: 6.7 g/dL (ref 6.0–8.5)

## 2021-04-17 ENCOUNTER — Ambulatory Visit (INDEPENDENT_AMBULATORY_CARE_PROVIDER_SITE_OTHER): Payer: Medicare Other | Admitting: Cardiovascular Disease

## 2021-04-17 ENCOUNTER — Other Ambulatory Visit: Payer: Self-pay

## 2021-04-17 ENCOUNTER — Encounter: Payer: Self-pay | Admitting: Cardiovascular Disease

## 2021-04-17 VITALS — BP 122/66 | HR 57 | Ht 72.0 in | Wt 208.0 lb

## 2021-04-17 DIAGNOSIS — I255 Ischemic cardiomyopathy: Secondary | ICD-10-CM | POA: Diagnosis not present

## 2021-04-17 DIAGNOSIS — Z9861 Coronary angioplasty status: Secondary | ICD-10-CM | POA: Diagnosis not present

## 2021-04-17 DIAGNOSIS — E785 Hyperlipidemia, unspecified: Secondary | ICD-10-CM | POA: Diagnosis not present

## 2021-04-17 DIAGNOSIS — I2510 Atherosclerotic heart disease of native coronary artery without angina pectoris: Secondary | ICD-10-CM

## 2021-04-17 NOTE — Patient Instructions (Signed)

## 2021-04-17 NOTE — Assessment & Plan Note (Signed)
History of CAD status post anterior STEMI 02/11/2017.  I brought him to the Cath Lab and performed radial diagnostic cath and PCI and stenting of the proximal LAD with a Synergy 3 mm x 12 mm long drug-eluting stent.  He does have a 60% proximal to mid LAD stenosis which I elected to treat medically.  His EF at that time was 50% with anteroapical wall motion abnormality.  His door to balloon time was 22 minutes.  Subsequent 2D echo performed 06/24/2017 showed normalization of LV function.  I ultimately discontinued his Plavix but unfortunately he came back with acute stent thrombosis and recurrent anterior MI 06/28/2019.  I placed 3 additional drug-eluting stents in his proximal LAD.  His door to balloon time was 46 minutes, delayed because of hypotension and reperfusion arrhythmias.  His mid LAD lesion had unchanged.  He is still active remains on dual antiplatelet therapy.  He denies chest pain or shortness of breath.

## 2021-04-17 NOTE — Assessment & Plan Note (Signed)
History of ischemic cardiomyopathy with echo performed 12/08/2019 revealing normalization of LV function with an EF of 50 to 55%.  His wall motion abnormalities had improved as well.

## 2021-04-17 NOTE — Progress Notes (Signed)
He did     04/17/2021 [facility]   27-Aug-1951  [id number]  Primary Physician Janith Lima, MD Primary Cardiologist: Lorretta Harp MD Lupe Carney, [place]  HPI:  John Mccarthy is a 70 y.o.   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 He is brought to the cath lab emergently and radial cath by myself revealing an ostial LAD occlusion which I stented with a synergy (3 mm x 12 mm)drug-eluting stent. He did have a 60% proximal to mid dominant RCA stenosis which I elected to treat medically. EF was 50% with anteroapical wall motion abnormality.His door to balloon time was 22 minutes. Follow-up 2-D echocardiogram performed 06/24/17 revealed normalization of LV function.He was on dual antiplatelet therapy including aspirin and Plavix until recently when he discontinued the Plavix which is appropriate. He is asymptomatic back playing tennisseveral times a week including singles tennis1-1/2hours at a time without limitation.   I last saw him in the hospital this past July when he presented with an anterior STEMI with occlusion of the previously placed stent. I intervened on him 06/28/2019 and placed 3 additional stents in his proximal LAD. His durable in time was 46 minutes, delayed because of hypotension and reperfusion arrhythmias. He did have a 99% ostial first diagonal branch stenosis which was jailed vessel and up in the previously placed stents as well as a 70% mid dominant RCA stenosis that was stable. His EF was 45 to 50% with anteroapical hypokinesia. He is asymptomatic. He is out walking every day, and is currently participating in cardiac rehab.He remains on dual antiplatelet therapy as well as high-dose statin therapy. He checks his blood pressure at  home religiously which runs in the 110/70 range.  Since I saw him in the office a year ago he continues to do well.  He is not as active as he used to be.  He denies chest pain or shortness of breath.  He remains on dual antibiotic therapy which she will be on for life.  2D echo performed 12/08/2019 revealed normalization of LV function as well as wall motion abnormalities.  Current Meds  Medication Sig  . aspirin EC 81 MG tablet Take 1 tablet (81 mg total) by mouth daily.  Marland Kitchen atorvastatin (LIPITOR) 80 MG tablet TAKE 1 TABLET BY MOUTH EVERY DAY AT 6 PM  . carvedilol (COREG) 6.25 MG tablet TAKE 1 TABLET (6.25 MG TOTAL) BY MOUTH 2 (TWO) TIMES DAILY WITH A MEAL.  Marland Kitchen clopidogrel (PLAVIX) 75 MG tablet Take 1 tablet (75 mg total) by mouth daily.  . Multiple Vitamins-Minerals (CENTRUM SILVER 50+MEN PO) Take 1 tablet by mouth daily.  . sertraline (ZOLOFT) 50 MG tablet Take 1 tablet (50 mg total) by mouth daily.     Allergies  Allergen Reactions  . Crestor [Rosuvastatin Calcium] Other (See Comments)    Muscle aches  . Lactose Intolerance (Gi) Diarrhea    Social History   Socioeconomic History  . Marital status: Married    Spouse name: Not on file  . Number of children: Not on file  . Years of education: Not on file  . Highest education level: Not on file  Occupational History  . Not on file  Tobacco Use  . Smoking status: Never Smoker  . Smokeless tobacco: Never Used  Vaping Use  .  Vaping Use: Never used  Substance and Sexual Activity  . Alcohol use: Yes    Alcohol/week: 5.0 standard drinks    Types: 5 Shots of liquor per week    Comment: occassional  . Drug use: No  . Sexual activity: Yes    Birth control/protection: None  Other Topics Concern  . Not on file  Social History Narrative  . Not on file   Social Determinants of Health   Financial Resource Strain: Low Risk   . Difficulty of Paying Living Expenses: Not hard at all  Food Insecurity: No Food Insecurity  . Worried  About Charity fundraiser in the Last Year: Never true  . Ran Out of Food in the Last Year: Never true  Transportation Needs: No Transportation Needs  . Lack of Transportation (Medical): No  . Lack of Transportation (Non-Medical): No  Physical Activity: Inactive  . Days of Exercise per Week: 0 days  . Minutes of Exercise per Session: 0 min  Stress: No Stress Concern Present  . Feeling of Stress : Not at all  Social Connections: Socially Integrated  . Frequency of Communication with Friends and Family: More than three times a week  . Frequency of Social Gatherings with Friends and Family: More than three times a week  . Attends Religious Services: More than 4 times per year  . Active Member of Clubs or Organizations: Yes  . Attends Archivist Meetings: More than 4 times per year  . Marital Status: Married  Human resources officer Violence: Not on file     Review of Systems: General: negative for chills, fever, night sweats or weight changes.  Cardiovascular: negative for chest pain, dyspnea on exertion, edema, orthopnea, palpitations, paroxysmal nocturnal dyspnea or shortness of breath Dermatological: negative for rash Respiratory: negative for cough or wheezing Urologic: negative for hematuria Abdominal: negative for nausea, vomiting, diarrhea, bright red blood per rectum, melena, or hematemesis Neurologic: negative for visual changes, syncope, or dizziness All other systems reviewed and are otherwise negative except as noted above.    Blood pressure 122/66, pulse (!) 57, height 6' (1.829 m), weight 208 lb (94.3 kg), SpO2 96 %.  General appearance: alert and no distress Neck: no adenopathy, no carotid bruit, no JVD, supple, symmetrical, trachea midline and thyroid not enlarged, symmetric, no tenderness/mass/nodules Lungs: clear to auscultation bilaterally Heart: regular rate and rhythm, S1, S2 normal, no murmur, click, rub or gallop Extremities: extremities normal, atraumatic,  no cyanosis or edema Pulses: 2+ and symmetric Skin: Skin color, texture, turgor normal. No rashes or lesions Neurologic: Alert and oriented X 3, normal strength and tone. Normal symmetric reflexes. Normal coordination and gait  EKG sinus bradycardia 57 with low limb voltage and septal Q waves.  I personally reviewed this EKG.  ASSESSMENT AND PLAN:   Dyslipidemia, goal LDL below 70 History of dyslipidemia on statin therapy with lipid profile performed 04/12/2021 revealing total cholesterol of 123, LDL of 65 and HDL 37.  CAD S/P percutaneous coronary angioplasty History of CAD status post anterior STEMI 02/11/2017.  I brought him to the Cath Lab and performed radial diagnostic cath and PCI and stenting of the proximal LAD with a Synergy 3 mm x 12 mm long drug-eluting stent.  He does have a 60% proximal to mid LAD stenosis which I elected to treat medically.  His EF at that time was 50% with anteroapical wall motion abnormality.  His door to balloon time was 22 minutes.  Subsequent 2D echo performed 06/24/2017 showed  normalization of LV function.  I ultimately discontinued his Plavix but unfortunately he came back with acute stent thrombosis and recurrent anterior MI 06/28/2019.  I placed 3 additional drug-eluting stents in his proximal LAD.  His door to balloon time was 46 minutes, delayed because of hypotension and reperfusion arrhythmias.  His mid LAD lesion had unchanged.  He is still active remains on dual antiplatelet therapy.  He denies chest pain or shortness of breath.  Ischemic cardiomyopathy History of ischemic cardiomyopathy with echo performed 12/08/2019 revealing normalization of LV function with an EF of 50 to 55%.  His wall motion abnormalities had improved as well.      Lorretta Harp MD FACP,FACC,FAHA, [facility] 04/17/2021 11:14 AM

## 2021-04-17 NOTE — Assessment & Plan Note (Signed)
History of dyslipidemia on statin therapy with lipid profile performed 04/12/2021 revealing total cholesterol of 123, LDL of 65 and HDL 37.

## 2021-04-26 ENCOUNTER — Telehealth: Payer: Self-pay | Admitting: Internal Medicine

## 2021-04-26 NOTE — Progress Notes (Signed)
  Chronic Care Management   Outreach Note  04/26/2021 Name: John Mccarthy MRN: [id number] DOB: 05-02-1951  Referred by: Janith Lima, MD Reason for referral : No chief complaint on file.   An unsuccessful telephone outreach was attempted today. The patient was referred to the pharmacist for assistance with care management and care coordination.   Follow Up Plan:   Lauretta Grill Upstream Scheduler

## 2021-04-26 NOTE — Chronic Care Management (AMB) (Signed)
  Chronic Care Management   Note  04/26/2021 Name: John Mccarthy MRN: [id number] DOB: 25-Nov-1951  [name] is a 70 y.o. year old male who is a primary care patient of Janith Lima, MD. I reached out to [name] by phone today in response to a referral sent by [name] PCP, Janith Lima, MD.   [name] was given information about Chronic Care Management services today including:  1. CCM service includes personalized support from designated clinical staff supervised by his physician, including individualized plan of care and coordination with other care providers 2. 24/7 contact phone numbers for assistance for urgent and routine care needs. 3. Service will only be billed when office clinical staff spend 20 minutes or more in a month to coordinate care. 4. Only one practitioner may furnish and bill the service in a calendar month. 5. The patient may stop CCM services at any time (effective at the end of the month) by phone call to the office staff.   Patient agreed to services and verbal consent obtained.   Follow up plan:   Lauretta Grill Upstream Scheduler

## 2021-04-27 IMAGING — DX PORTABLE CHEST - 1 VIEW
1 series · 1 of 1 positions shown · non-contrast
Comparison: 02/11/2017, CT chest 05/23/2015

CLINICAL DATA: STEMI

EXAM:
PORTABLE CHEST 1 VIEW

[chest ap]
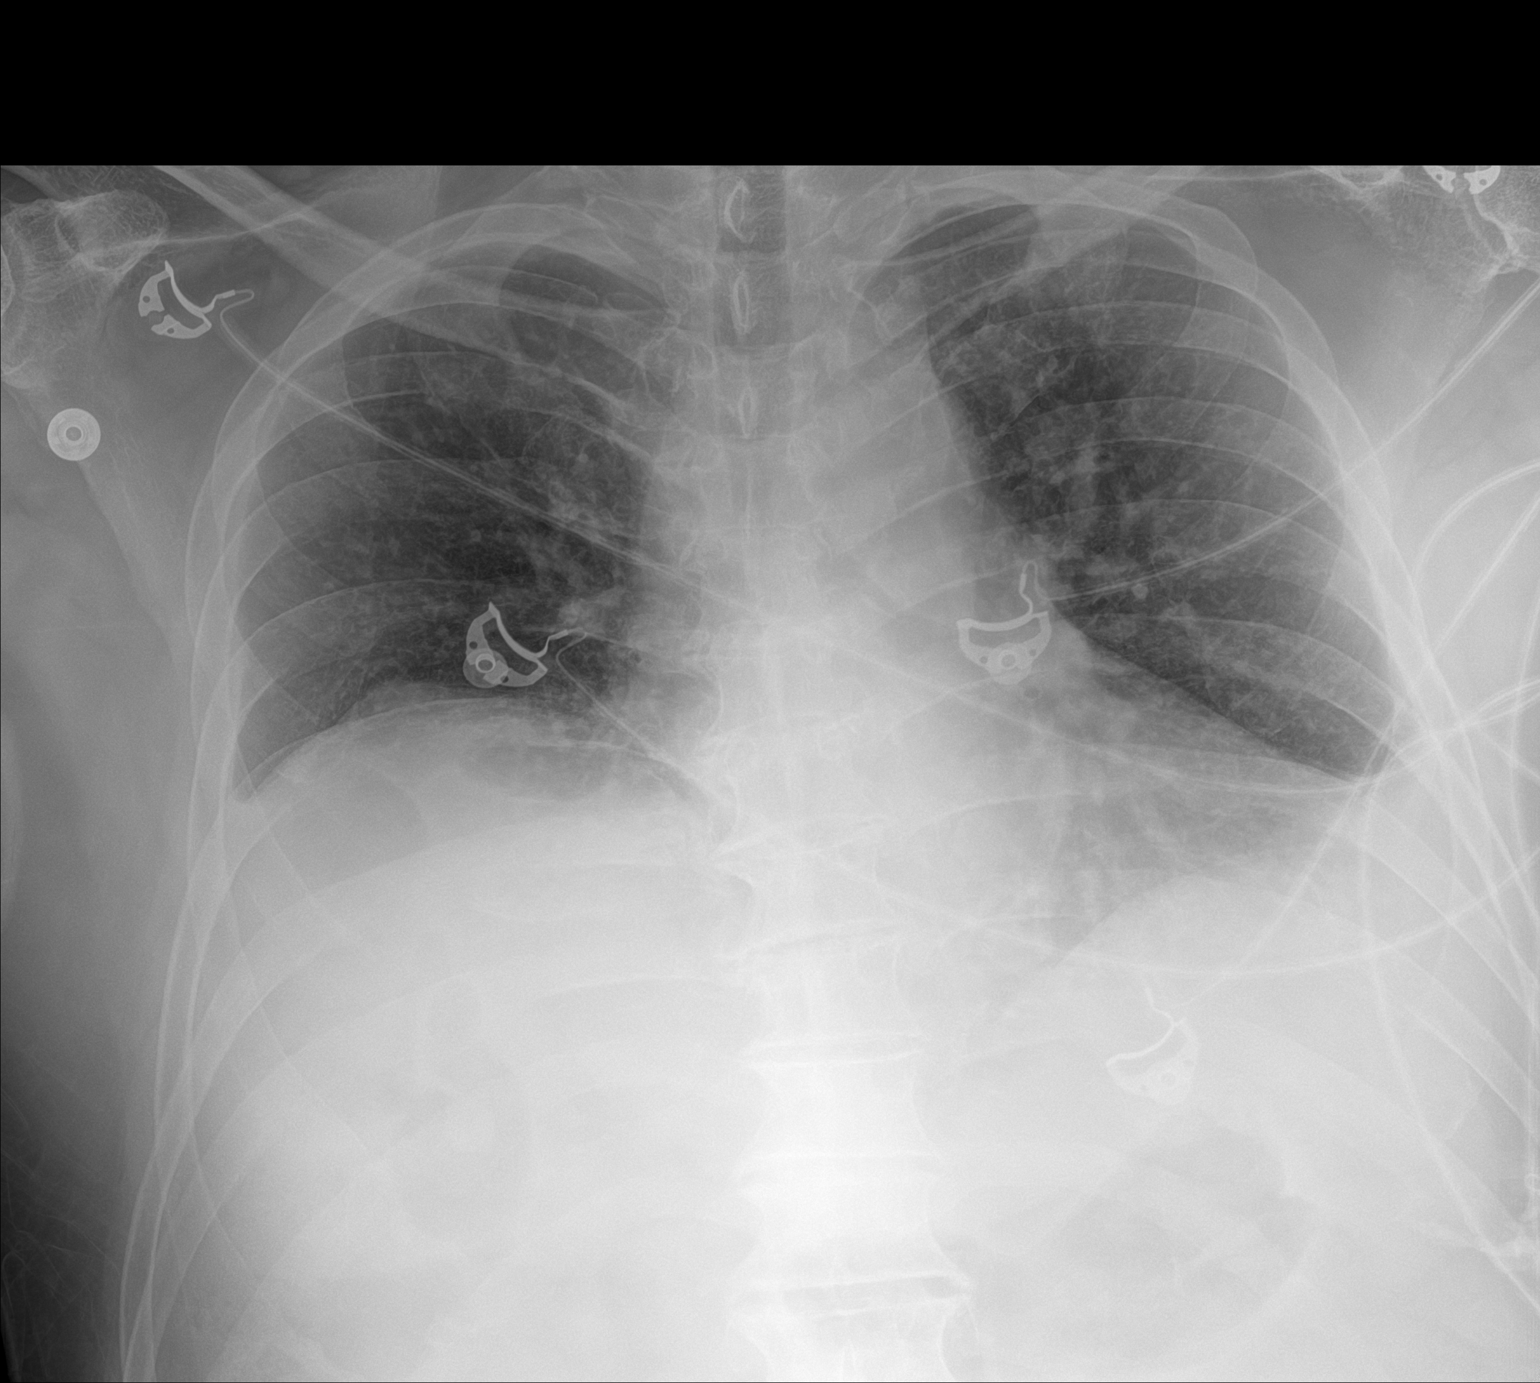

[1 of 1 positions shown; findings below may reference images not displayed]

FINDINGS: Low lung volumes. Chronic elevation of right diaphragm. Possible
small pleural effusions. Stable cardiomediastinal silhouette. No
pneumothorax.
IMPRESSION: Low lung volumes with possible small pleural effusions.

## 2021-05-21 ENCOUNTER — Other Ambulatory Visit: Payer: Self-pay | Admitting: Cardiovascular Disease

## 2021-06-06 NOTE — Progress Notes (Signed)
Chronic Care Management Pharmacy Note  06/07/2021 Name:  John Mccarthy MRN:  [id number] DOB:  10/21/1951  Summary: -Patient doing well, denies any issues or concerns with medications -Has been checking blood pressure at home, has been averaging <120/80 - HR ~60 - no issues with hyper/hypotension  -reports that he feels depression is under control, is interested in counseling/ psych referral   Recommendations/Changes made from today's visit: - No changes to medications, patient agreeable to continue to check blood pressure at least once weekly - Send psych referral as patient requested   Subjective: John Mccarthy is an 70 y.o. year old male who is a primary patient of Janith Lima, MD.  The CCM team was consulted for assistance with disease management and care coordination needs.    Engaged with patient face to face for initial visit in response to provider referral for pharmacy case management and/or care coordination services.   Consent to Services:  The patient was given the following information about Chronic Care Management services today, agreed to services, and gave verbal consent: 1. CCM service includes personalized support from designated clinical staff supervised by the primary care provider, including individualized plan of care and coordination with other care providers 2. 24/7 contact phone numbers for assistance for urgent and routine care needs. 3. Service will only be billed when office clinical staff spend 20 minutes or more in a month to coordinate care. 4. Only one practitioner may furnish and bill the service in a calendar month. 5.The patient may stop CCM services at any time (effective at the end of the month) by phone call to the office staff. 6. The patient will be responsible for cost sharing (co-pay) of up to 20% of the service fee (after annual deductible is met). Patient agreed to services and consent obtained.  Patient Care Team: Janith Lima, MD  as PCP - General (Internal Medicine) Lorretta Harp, MD as PCP - Cardiology (Cardiology) Delice Bison Darnelle Maffucci, [facility] as Pharmacist (Pharmacist)  Recent office visits: 02/07/2021 - Lisette Abu - LPN -CCM care team - completed AWV  09/07/2020 - PCP visit - brilinta d/cd - continued on dual antiplatelet therapy (ASA and plavix)  Recent consult visits: 04/17/2021 - Dr. Gwenlyn Found - Cardiology - EKG showed sinus bradycardia with low limb voltage and septal Q wavers - no changes to medications at this time   Hospital visits: None in previous 6 months  Objective:  Lab Results  Component Value Date   CREATININE 1.05 09/08/2020   BUN 13 09/08/2020   GFR 70.91 09/08/2020   GFRNONAA >60 07/01/2019   GFRAA >60 07/01/2019   NA 142 09/08/2020   K 4.0 09/08/2020   CALCIUM 9.3 09/08/2020   CO2 31 09/08/2020   GLUCOSE 100 (H) 09/08/2020    Lab Results  Component Value Date/Time   HGBA1C 5.5 10/15/2016 03:17 PM   HGBA1C 5.8 (H) 05/24/2015 07:08 AM   GFR 70.91 09/08/2020 08:14 AM   GFR 74.70 07/06/2019 03:08 PM    Last diabetic Eye exam:  No results found for: HMDIABEYEEXA  Last diabetic Foot exam:  No results found for: HMDIABFOOTEX   Lab Results  Component Value Date   CHOL 123 04/12/2021   HDL 37 (L) 04/12/2021   LDLCALC 65 04/12/2021   LDLDIRECT 146.0 10/15/2016   TRIG 118 04/12/2021   CHOLHDL 3.3 04/12/2021    Hepatic Function Latest Ref Rng & Units 04/12/2021 10/08/2019 06/28/2019  Total Protein 6.0 - 8.5 g/dL 6.7 6.8  7.0  Albumin 3.7 - 4.7 g/dL 4.3 4.1 4.1  AST 0 - 40 IU/L 26 33 32  ALT 0 - 44 IU/L 22 37 32  Alk Phosphatase 44 - 121 IU/L 93 113 84  Total Bilirubin 0.0 - 1.2 mg/dL 0.7 0.8 1.0  Bilirubin, Direct 0.00 - 0.40 mg/dL 0.21 0.23 -    Lab Results  Component Value Date/Time   TSH 3.43 09/08/2020 08:14 AM   TSH 2.93 07/09/2018 09:31 AM    CBC Latest Ref Rng & Units 09/08/2020 07/06/2019 07/01/2019  WBC 4.0 - 10.5 K/uL 8.8 10.5 12.6(H)  Hemoglobin 13.0 - 17.0 g/dL  14.5 15.0 13.8  Hematocrit 39.0 - 52.0 % 42.7 44.3 41.1  Platelets 150.0 - 400.0 K/uL 193.0 219.0 172    No results found for: VD25OH  Clinical ASCVD: Yes  The ASCVD Risk score Mikey Bussing DC Jr., et al., 2013) failed to calculate for the following reasons:   The patient has a prior MI or stroke diagnosis    Depression screen [facility] 2/9 06/07/2021 02/07/2021 05/12/2020  Decreased Interest 0 0 0  Down, Depressed, Hopeless 0 0 0  PHQ - 2 Score 0 0 0  Altered sleeping 0 - -  Tired, decreased energy 0 - -  Change in appetite 0 - -  Feeling bad or failure about yourself  0 - -  Trouble concentrating 0 - -  Moving slowly or fidgety/restless 0 - -  Suicidal thoughts 0 - -  PHQ-9 Score 0 - -  Difficult doing work/chores - - -    Social History   Tobacco Use  Smoking Status Never  Smokeless Tobacco Never   BP Readings from Last 3 Encounters:  04/17/21 122/66  02/07/21 100/70  09/07/20 120/78   Pulse Readings from Last 3 Encounters:  04/17/21 (!) 57  02/07/21 (!) 59  09/07/20 68   Wt Readings from Last 3 Encounters:  04/17/21 208 lb (94.3 kg)  02/07/21 204 lb 3.2 oz (92.6 kg)  09/07/20 199 lb (90.3 kg)   BMI Readings from Last 3 Encounters:  04/17/21 28.21 kg/m  02/07/21 28.48 kg/m  09/07/20 27.75 kg/m    Assessment/Interventions: Review of patient past medical history, allergies, medications, health status, including review of consultants reports, laboratory and other test data, was performed as part of comprehensive evaluation and provision of chronic care management services.   SDOH:  (Social Determinants of Health) assessments and interventions performed: Yes  SDOH Screenings   Alcohol Screen: Low Risk    Last Alcohol Screening Score (AUDIT): 4  Depression (PHQ2-9): Low Risk    PHQ-2 Score: 0  Financial Resource Strain: Low Risk    Difficulty of Paying Living Expenses: Not hard at all  Food Insecurity: No Food Insecurity   Worried About Charity fundraiser in the Last  Year: Never true   Ran Out of Food in the Last Year: Never true  Housing: Low Risk    Last Housing Risk Score: 0  Physical Activity: Inactive   Days of Exercise per Week: 0 days   Minutes of Exercise per Session: 0 min  Social Connections: Engineer, building services of Communication with Friends and Family: More than three times a week   Frequency of Social Gatherings with Friends and Family: More than three times a week   Attends Religious Services: More than 4 times per year   Active Member of [employer] or Organizations: Yes   Attends Archivist Meetings: More than 4 times per  year   Marital Status: Married  Stress: No Stress Concern Present   Feeling of Stress : Not at all  Tobacco Use: Low Risk    Smoking Tobacco Use: Never   Smokeless Tobacco Use: Never  Transportation Needs: No Transportation Needs   Lack of Transportation (Medical): No   Lack of Transportation (Non-Medical): No    CCM Care Plan  Allergies  Allergen Reactions   Crestor [Rosuvastatin Calcium] Other (See Comments)    Muscle aches   Lactose Intolerance (Gi) Diarrhea    Medications Reviewed Today     Reviewed by Tomasa Blase, [facility] (Pharmacist) on 06/07/21 at [phone]  Med List Status: <None>   Medication Order Taking? Sig Documenting Provider Last Dose Status Informant  aspirin EC 81 MG tablet [id number] Yes Take 1 tablet (81 mg total) by mouth daily. Janith Lima, MD Taking Active Self           Med Note Armando Gang Feb 29, 2020  8:05 AM)    atorvastatin (LIPITOR) 80 MG tablet [id number] Yes TAKE 1 TABLET BY MOUTH EVERY DAY AT 6 PM Lorretta Harp, MD Taking Active   Calcium Carbonate (CALCIUM 500 PO) [id number] Yes Take 1 tablet by mouth once a week. [provider] Taking Active   Calcium Carbonate Antacid (ALKA-SELTZER ANTACID PO) [id number] Yes Take 1 tablet by mouth daily as needed. [provider] Taking Active   carvedilol (COREG) 6.25 MG tablet  [id number] Yes TAKE 1 TABLET (6.25 MG TOTAL) BY MOUTH 2 (TWO) TIMES DAILY WITH A MEAL. Lorretta Harp, MD Taking Active   clopidogrel (PLAVIX) 75 MG tablet [id number] Yes TAKE 1 TABLET BY MOUTH EVERY DAY Lorretta Harp, MD Taking Active   Multiple Vitamins-Minerals (CENTRUM SILVER 50+MEN PO) [id number] Yes Take 1 tablet by mouth daily. [provider] Taking Active Self  nitroGLYCERIN (NITROSTAT) 0.4 MG SL tablet [id number]  Place 1 tablet (0.4 mg total) under the tongue every 5 (five) minutes as needed for chest pain. Deberah Pelton, NP  Expired 10/06/19 2359   sertraline (ZOLOFT) 50 MG tablet [id number] Yes Take 1 tablet (50 mg total) by mouth daily. 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   CAD S/P percutaneous coronary angioplasty 02/25/2017   Ischemic cardiomyopathy 02/25/2017   Primary osteoarthritis of both knees 05/31/2015   BPH (benign prostatic hyperplasia) 02/13/2015   Dyslipidemia, goal LDL below 70 07/16/2013   Routine general medical examination at a health care facility 06/18/2013    Immunization History  Administered Date(s) Administered   Fluad Quad(high Dose 65+) 07/27/2019, 09/07/2020   Influenza Split 09/25/2012   Influenza, High Dose Seasonal PF 10/15/2016, 10/16/2017, 09/09/2018   Influenza,inj,Quad PF,6+ Mos 10/18/2013   PFIZER(Purple Top)SARS-COV-2 Vaccination 01/09/2020, 02/02/2020, 10/15/2020   Pneumococcal Conjugate-13 05/31/2015   Pneumococcal Polysaccharide-23 06/18/2013, 12/14/2018   Td 11/15/2008   Tdap 07/06/2019   Zoster Recombinat (Shingrix) 11/01/2020, 01/16/2021   Zoster, Live 07/16/2013    Conditions to be  addressed/monitored:  Hypertension, Hyperlipidemia, Coronary Artery Disease, and Depression  Care Plan : CCM Care Plan  Updates made by Tomasa Blase, [place] since  06/07/2021 12:00 AM     Problem: HLD, CAD, Previous STEMI, Depression   Priority: High  Onset Date: 06/07/2021     Long-Range Goal: Disease Management   Start Date: 06/07/2021  Expected End Date: 12/08/2021  This Visit's Progress: On track  Priority: High  Note:   Current Barriers:  Unable to independently monitor therapeutic efficacy  Pharmacist Clinical Goal(s):  Patient will achieve adherence to monitoring guidelines and medication adherence to achieve therapeutic efficacy maintain control of blood pressure and LDL as evidenced by blood pressure logs and next lipid panel  through collaboration with PharmD and provider.   Interventions: 1:1 collaboration with Janith Lima, MD regarding development and update of comprehensive plan of care as evidenced by provider attestation and co-signature Inter-disciplinary care team collaboration (see longitudinal plan of care) Comprehensive medication review performed; medication list updated in electronic medical record  Hyperlipidemia/ CAD / Previous STEMI x 2 - most recent 06/21/19: (LDL goal < 70) -Controlled -Last LDL 74m/dL (04/12/2021) -Current treatment: Atorvastatin 850mdaily  Clopidogrel 7555maily  Aspirin 50m80mily  Carvedilol 6.25mg59m tablet twice daily  Nitroglycerin 0.4mg S71mablet - 1 tablet under the tongue every 5 minutes as needed  -Medications previously tried: rosuvastatin (causes muscle aches), metoprolol  -Current home readings: 98/67,   106/52 -58,  112/68 - 69 -Current dietary patterns: moderates fatty/fried food intake -Current exercise habits: limited at this time, previously had been more active playing tennis and pickleball, but since last heart attack has not been as active  -Educated on Cholesterol goals;  Benefits of statin for ASCVD risk  reduction; Importance of limiting foods high in cholesterol; -Denies hypotensive/hypertensive symptoms -Educated on BP goals and benefits of medications for prevention of heart attack, stroke and kidney damage; Daily salt intake goal < 2300 mg; Exercise goal of 150 minutes per week; Importance of home blood pressure monitoring; Symptoms of hypotension and importance of maintaining adequate hydration; -Counseled on diet and exercise extensively Recommended to continue current medication  Adjustment disorder (Goal: Mood Control ) -Controlled -Current treatment: Sertraline 50mg d86m  -Medications previously tried/failed: n/a -PHQ9: 0 -Educated on Benefits of medication for symptom control Benefits of cognitive-behavioral therapy with or without medication -Recommended to continue current medication -discussed with patient about counseling /psych referral which he would like to have sent   Health Maintenance -Vaccine gaps: COVID 19 Booster (4th dose) -Current therapy:  Multivitamin - 1 tablet daily  Alka-Seltzer antacid - 1 tablet daily as needed  Calcium carbonate 500mg - 62mblet weekly  -Educated on Cost vs benefit of each product must be carefully weighed by individual consumer -Patient is satisfied with current therapy and denies issues -Recommended to continue current medication  Patient Goals/Self-Care Activities Patient will:  - take medications as prescribed check blood pressure once weekly, document, and provide at future appointments  Follow Up Plan: Telephone follow up appointment with care management team member scheduled for: The patient has been provided with contact information for the care management team and has been advised to call with any health related questions or concerns.        Medication Assistance: None required.  Patient affirms current coverage meets needs.  Compliance/Adherence/Medication fill history: Care Gaps: Colonoscopy   Patient's  preferred pharmacy is:  [id number] [id number], [place] - [address] [phone] [phone]? Yes Pt endorses 100% compliance  Care Plan  and Follow Up Patient Decision:  Patient agrees to Care Plan and Follow-up.  Plan: Telephone follow up appointment with care management team member scheduled for:  6 months  and The patient has been provided with contact information for the care management team and has been advised to call with any health related questions or concerns.   Tomasa Blase, PharmD Clinical Pharmacist, [place]

## 2021-06-07 ENCOUNTER — Other Ambulatory Visit: Payer: Self-pay

## 2021-06-07 ENCOUNTER — Ambulatory Visit (INDEPENDENT_AMBULATORY_CARE_PROVIDER_SITE_OTHER): Payer: Medicare Other

## 2021-06-07 DIAGNOSIS — Z9861 Coronary angioplasty status: Secondary | ICD-10-CM | POA: Diagnosis not present

## 2021-06-07 DIAGNOSIS — E785 Hyperlipidemia, unspecified: Secondary | ICD-10-CM

## 2021-06-07 DIAGNOSIS — I2510 Atherosclerotic heart disease of native coronary artery without angina pectoris: Secondary | ICD-10-CM

## 2021-06-07 DIAGNOSIS — I252 Old myocardial infarction: Secondary | ICD-10-CM | POA: Diagnosis not present

## 2021-06-07 DIAGNOSIS — F4323 Adjustment disorder with mixed anxiety and depressed mood: Secondary | ICD-10-CM

## 2021-06-07 NOTE — Patient Instructions (Signed)
Visit Information   PATIENT GOALS:   Goals Addressed             This Visit's Progress    Begin and Stick with Counseling-Depression       Timeframe:  Long-Range Goal Priority:  High Start Date: 06/07/2021                            Expected End Date: 12/08/2021                      Follow Up Date 12/07/2021   - check out counseling - keep 90 percent of counseling appointments - schedule counseling appointment    Why is this important?   Beating depression may take some time.  If you don't feel better right away, don't give up on your treatment plan.         Track and Manage Blood Pressure and Heart Rate - Previous History of STEMI and CAD       Timeframe:  Long-Range Goal Priority:  High Start Date:  06/07/2021                          Expected End Date:  12/08/2021                    Follow Up Date 12/07/2021   - check blood pressure weekly - choose a place to take my blood pressure (home, clinic or office, retail store) - write blood pressure results in a log or diary    Why is this important?   You won't feel high blood pressure, but it can still hurt your blood vessels.  High blood pressure can cause heart or kidney problems. It can also cause a stroke.  Making lifestyle changes like losing a little weight or eating less salt will help.  Checking your blood pressure at home and at different times of the day can help to control blood pressure.  If the doctor prescribes medicine remember to take it the way the doctor ordered.  Call the office if you cannot afford the medicine or if there are questions about it.             Consent to CCM Services: John Mccarthy was given information about Chronic Care Management services today including:  CCM service includes personalized support from designated clinical staff supervised by his physician, including individualized plan of care and coordination with other care providers 24/7 contact phone numbers for assistance for  urgent and routine care needs. Service will only be billed when office clinical staff spend 20 minutes or more in a month to coordinate care. Only one practitioner may furnish and bill the service in a calendar month. The patient may stop CCM services at any time (effective at the end of the month) by phone call to the office staff. The patient will be responsible for cost sharing (co-pay) of up to 20% of the service fee (after annual deductible is met).  Patient agreed to services and verbal consent obtained.   Patient verbalizes understanding of instructions provided today and agrees to view in [place].   Telephone follow up appointment with care management team member scheduled for: 6 months  The patient has been provided with contact information for the care management team and has been advised to call with any health related questions or concerns.   Tomasa Blase, PharmD Clinical  Pharmacist, [place]    CLINICAL CARE PLAN: Patient Care Plan: CCM Care Plan     Problem Identified: HLD, CAD, Previous STEMI, Depression   Priority: High  Onset Date: 06/07/2021     Long-Range Goal: Disease Management   Start Date: 06/07/2021  Expected End Date: 12/08/2021  This Visit's Progress: On track  Priority: High  Note:   Current Barriers:  Unable to independently monitor therapeutic efficacy  Pharmacist Clinical Goal(s):  Patient will achieve adherence to monitoring guidelines and medication adherence to achieve therapeutic efficacy maintain control of blood pressure and LDL as evidenced by blood pressure logs and next lipid panel  through collaboration with PharmD and provider.   Interventions: 1:1 collaboration with Janith Lima, MD regarding development and update of comprehensive plan of care as evidenced by provider attestation and co-signature Inter-disciplinary care team collaboration (see longitudinal plan of care) Comprehensive medication review performed;  medication list updated in electronic medical record  Hyperlipidemia/ CAD / Previous STEMI x 2 - most recent 06/21/19: (LDL goal < 70) -Controlled -Last LDL 75m/dL (04/12/2021) -Current treatment: Atorvastatin 843mdaily  Clopidogrel 7530maily  Aspirin 66m39mily  Carvedilol 6.25mg73m tablet twice daily  Nitroglycerin 0.4mg S63mablet - 1 tablet under the tongue every 5 minutes as needed  -Medications previously tried: rosuvastatin (causes muscle aches), metoprolol  -Current home readings: 98/67,   106/52 -58,  112/68 - 69 -Current dietary patterns: moderates fatty/fried food intake -Current exercise habits: limited at this time, previously had been more active playing tennis and pickleball, but since last heart attack has not been as active  -Educated on Cholesterol goals;  Benefits of statin for ASCVD risk reduction; Importance of limiting foods high in cholesterol; -Denies hypotensive/hypertensive symptoms -Educated on BP goals and benefits of medications for prevention of heart attack, stroke and kidney damage; Daily salt intake goal < 2300 mg; Exercise goal of 150 minutes per week; Importance of home blood pressure monitoring; Symptoms of hypotension and importance of maintaining adequate hydration; -Counseled on diet and exercise extensively Recommended to continue current medication  Depression (Goal: Mood Control ) -Controlled -Current treatment: Sertraline 50mg d79m  -Medications previously tried/failed: n/a -PHQ9: 0 -Educated on Benefits of medication for symptom control Benefits of cognitive-behavioral therapy with or without medication -Recommended to continue current medication -discussed with patient about counseling /psych referral which he would like to have sent   Health Maintenance -Vaccine gaps: COVID 19 Booster (4th dose) -Current therapy:  Multivitamin - 1 tablet daily  Alka-Seltzer antacid - 1 tablet daily as needed  Calcium carbonate 500mg - 70mblet  weekly  -Educated on Cost vs benefit of each product must be carefully weighed by individual consumer -Patient is satisfied with current therapy and denies issues -Recommended to continue current medication  Patient Goals/Self-Care Activities Patient will:  - take medications as prescribed check blood pressure once weekly, document, and provide at future appointments  Follow Up Plan: Telephone follow up appointment with care management team member scheduled for: The patient has been provided with contact information for the care management team and has been advised to call with any health related questions or concerns.

## 2021-07-09 DIAGNOSIS — Z85828 Personal history of other malignant neoplasm of skin: Secondary | ICD-10-CM | POA: Diagnosis not present

## 2021-07-09 DIAGNOSIS — L821 Other seborrheic keratosis: Secondary | ICD-10-CM | POA: Diagnosis not present

## 2021-07-09 DIAGNOSIS — D1801 Hemangioma of skin and subcutaneous tissue: Secondary | ICD-10-CM | POA: Diagnosis not present

## 2021-07-09 DIAGNOSIS — D225 Melanocytic nevi of trunk: Secondary | ICD-10-CM | POA: Diagnosis not present

## 2021-07-09 DIAGNOSIS — L570 Actinic keratosis: Secondary | ICD-10-CM | POA: Diagnosis not present

## 2021-07-09 DIAGNOSIS — D2272 Melanocytic nevi of left lower limb, including hip: Secondary | ICD-10-CM | POA: Diagnosis not present

## 2021-07-09 DIAGNOSIS — L814 Other melanin hyperpigmentation: Secondary | ICD-10-CM | POA: Diagnosis not present

## 2021-07-09 DIAGNOSIS — C4441 Basal cell carcinoma of skin of scalp and neck: Secondary | ICD-10-CM | POA: Diagnosis not present

## 2021-07-09 DIAGNOSIS — D2271 Melanocytic nevi of right lower limb, including hip: Secondary | ICD-10-CM | POA: Diagnosis not present

## 2021-07-23 DIAGNOSIS — H353131 Nonexudative age-related macular degeneration, bilateral, early dry stage: Secondary | ICD-10-CM | POA: Diagnosis not present

## 2021-07-23 DIAGNOSIS — H5213 Myopia, bilateral: Secondary | ICD-10-CM | POA: Diagnosis not present

## 2021-07-23 DIAGNOSIS — H524 Presbyopia: Secondary | ICD-10-CM | POA: Diagnosis not present

## 2021-07-23 DIAGNOSIS — H2513 Age-related nuclear cataract, bilateral: Secondary | ICD-10-CM | POA: Diagnosis not present

## 2021-08-09 DIAGNOSIS — Z85828 Personal history of other malignant neoplasm of skin: Secondary | ICD-10-CM | POA: Diagnosis not present

## 2021-08-09 DIAGNOSIS — C44319 Basal cell carcinoma of skin of other parts of face: Secondary | ICD-10-CM | POA: Diagnosis not present

## 2021-08-28 ENCOUNTER — Other Ambulatory Visit: Payer: Self-pay | Admitting: Internal Medicine

## 2021-08-28 DIAGNOSIS — F4323 Adjustment disorder with mixed anxiety and depressed mood: Secondary | ICD-10-CM

## 2021-09-17 DIAGNOSIS — Z23 Encounter for immunization: Secondary | ICD-10-CM | POA: Diagnosis not present

## 2021-09-18 ENCOUNTER — Other Ambulatory Visit: Payer: Self-pay | Admitting: Cardiovascular Disease

## 2021-10-08 ENCOUNTER — Telehealth: Payer: Self-pay

## 2021-10-08 NOTE — Progress Notes (Signed)
Chronic Care Management Pharmacy Assistant   Name: John Mccarthy  MRN: [id number] DOB: 1950-12-16  Reason for Encounter: Disease State -Hypertension Appointment: Telephone 12/04/20 @ 11 am  Recent office visits:  None listed  Recent consult visits:  None listed  Hospital visits:  None in previous 6 months  Medications: Outpatient Encounter Medications as of 10/08/2021  Medication Sig   aspirin EC 81 MG tablet Take 1 tablet (81 mg total) by mouth daily.   atorvastatin (LIPITOR) 80 MG tablet TAKE 1 TABLET BY MOUTH EVERY DAY AT 6 PM   Calcium Carbonate (CALCIUM 500 PO) Take 1 tablet by mouth once a week.   Calcium Carbonate Antacid (ALKA-SELTZER ANTACID PO) Take 1 tablet by mouth daily as needed.   carvedilol (COREG) 6.25 MG tablet TAKE 1 TABLET BY MOUTH 2 TIMES DAILY WITH A MEAL.   clopidogrel (PLAVIX) 75 MG tablet TAKE 1 TABLET BY MOUTH EVERY DAY   Multiple Vitamins-Minerals (CENTRUM SILVER 50+MEN PO) Take 1 tablet by mouth daily.   nitroGLYCERIN (NITROSTAT) 0.4 MG SL tablet Place 1 tablet (0.4 mg total) under the tongue every 5 (five) minutes as needed for chest pain.   sertraline (ZOLOFT) 50 MG tablet TAKE 1 TABLET BY MOUTH EVERY DAY   No facility-administered encounter medications on file as of 10/08/2021.    Reviewed chart prior to disease state call. Spoke with patient regarding BP  Recent Office Vitals: BP Readings from Last 3 Encounters:  04/17/21 122/66  02/07/21 100/70  09/07/20 120/78   Pulse Readings from Last 3 Encounters:  04/17/21 (!) 57  02/07/21 (!) 59  09/07/20 68    Wt Readings from Last 3 Encounters:  04/17/21 208 lb (94.3 kg)  02/07/21 204 lb 3.2 oz (92.6 kg)  09/07/20 199 lb (90.3 kg)     Kidney Function Lab Results  Component Value Date/Time   CREATININE 1.05 09/08/2020 08:14 AM   CREATININE 0.99 07/06/2019 03:08 PM   CREATININE 0.95 04/16/2017 08:06 AM   GFR 70.91 09/08/2020 08:14 AM   GFRNONAA >60 07/01/2019 05:05 AM   GFRNONAA  82 04/16/2017 08:06 AM   GFRAA >60 07/01/2019 05:05 AM   GFRAA >89 04/16/2017 08:06 AM    BMP Latest Ref Rng & Units 09/08/2020 07/06/2019 07/01/2019  Glucose 70 - 99 mg/dL 100(H) 99 108(H)  BUN 6 - 23 mg/dL 13 16 14   Creatinine 0.40 - 1.50 mg/dL 1.05 0.99 1.14  Sodium 135 - 145 mEq/L 142 139 141  Potassium 3.5 - 5.1 mEq/L 4.0 4.1 3.9  Chloride 96 - 112 mEq/L 103 104 108  CO2 19 - 32 mEq/L 31 25 23   Calcium 8.4 - 10.5 mg/dL 9.3 9.3 8.3(L)    Current antihypertensive regimen:  Carvedilol 6.25mg  - 1 tablet twice daily  Nitroglycerin 0.4mg  SL tablet - 1 tablet under the tongue every 5 minutes as needed   How often are you checking your Blood Pressure?  Patient states he hasn't been checking his BP at home lately, he has been feeling fine.  Current home BP readings:  No readings to report  What recent interventions/DTPs have been made by any provider to improve Blood Pressure control since last CPP Visit:  None noted  Any recent hospitalizations or ED visits since last visit with CPP? No  What diet changes have been made to improve Blood Pressure Control?  Patient states no recent diet changes.  What exercise is being done to improve your Blood Pressure Control?   Patient previously had been  more active playing tennis and pickleball, but since last heart attack has not been as active.  Adherence Review: Is the patient currently on ACE/ARB medication? No Does the patient have >5 day gap between last estimated fill dates? No   Care Gaps Colonoscopy - last 11/24/2008 Diabetic Foot Exam - NA Mammogram - NA Ophthalmology - NA Dexa Scan - NA Annual Well Visit - NA Micro albumin - NA Hemoglobin A1c - NA  Star Rating Drugs: Atorvastatin - filled 10/07/21 [place], [place] Clinical Pharmacists Assistant [phone]

## 2021-11-23 ENCOUNTER — Other Ambulatory Visit: Payer: Self-pay | Admitting: Internal Medicine

## 2021-11-23 DIAGNOSIS — F4323 Adjustment disorder with mixed anxiety and depressed mood: Secondary | ICD-10-CM

## 2021-12-04 ENCOUNTER — Other Ambulatory Visit: Payer: Self-pay

## 2021-12-04 ENCOUNTER — Ambulatory Visit (INDEPENDENT_AMBULATORY_CARE_PROVIDER_SITE_OTHER): Payer: Medicare Other

## 2021-12-04 DIAGNOSIS — I252 Old myocardial infarction: Secondary | ICD-10-CM

## 2021-12-04 DIAGNOSIS — I2510 Atherosclerotic heart disease of native coronary artery without angina pectoris: Secondary | ICD-10-CM

## 2021-12-04 NOTE — Progress Notes (Signed)
Chronic Care Management Pharmacy Note  12/04/2021 Name:  John Mccarthy MRN:  [id number] DOB:  January 22, 1951  Summary: -Patient doing well, denies any issues or concerns with medications -Has been checking blood pressure on occasion - reports that most recent readings were 104/63 and 98/67 - patient reports no issues with symptoms of hypotension -  -Reports that he has not been as active as he would like to be, plans to increase activity in the new year   Recommendations/Changes made from today's visit: -Recommending no changes to medications, agreeable to continue checking blood pressure weekly - to reach out for symptoms of hypotension / should BP become uncontrolled   Subjective: John Mccarthy is an 71 y.o. year old male who is a primary patient of Janith Lima, MD.  The CCM team was consulted for assistance with disease management and care coordination needs.    Engaged with patient by telephone for follow up visit in response to provider referral for pharmacy case management and/or care coordination services.   Consent to Services:  The patient was given the following information about Chronic Care Management services today, agreed to services, and gave verbal consent: 1. CCM service includes personalized support from designated clinical staff supervised by the primary care provider, including individualized plan of care and coordination with other care providers 2. 24/7 contact phone numbers for assistance for urgent and routine care needs. 3. Service will only be billed when office clinical staff spend 20 minutes or more in a month to coordinate care. 4. Only one practitioner may furnish and bill the service in a calendar month. 5.The patient may stop CCM services at any time (effective at the end of the month) by phone call to the office staff. 6. The patient will be responsible for cost sharing (co-pay) of up to 20% of the service fee (after annual deductible is met). Patient  agreed to services and consent obtained.  Patient Care Team: Janith Lima, MD as PCP - General (Internal Medicine) Lorretta Harp, MD as PCP - Cardiology (Cardiology) Delice Bison Darnelle Maffucci, [facility] as Pharmacist (Pharmacist)  Recent office visits: None since last visit   Recent consult visits: None since last visit    Hospital visits: None in previous 6 months  Objective:  Lab Results  Component Value Date   CREATININE 1.05 09/08/2020   BUN 13 09/08/2020   GFR 70.91 09/08/2020   GFRNONAA >60 07/01/2019   GFRAA >60 07/01/2019   NA 142 09/08/2020   K 4.0 09/08/2020   CALCIUM 9.3 09/08/2020   CO2 31 09/08/2020   GLUCOSE 100 (H) 09/08/2020    Lab Results  Component Value Date/Time   HGBA1C 5.5 10/15/2016 03:17 PM   HGBA1C 5.8 (H) 05/24/2015 07:08 AM   GFR 70.91 09/08/2020 08:14 AM   GFR 74.70 07/06/2019 03:08 PM    Last diabetic Eye exam:  No results found for: HMDIABEYEEXA  Last diabetic Foot exam:  No results found for: HMDIABFOOTEX   Lab Results  Component Value Date   CHOL 123 04/12/2021   HDL 37 (L) 04/12/2021   LDLCALC 65 04/12/2021   LDLDIRECT 146.0 10/15/2016   TRIG 118 04/12/2021   CHOLHDL 3.3 04/12/2021    Hepatic Function Latest Ref Rng & Units 04/12/2021 10/08/2019 06/28/2019  Total Protein 6.0 - 8.5 g/dL 6.7 6.8 7.0  Albumin 3.7 - 4.7 g/dL 4.3 4.1 4.1  AST 0 - 40 IU/L 26 33 32  ALT 0 - 44 IU/L 22 37 32  Alk Phosphatase 44 - 121 IU/L 93 113 84  Total Bilirubin 0.0 - 1.2 mg/dL 0.7 0.8 1.0  Bilirubin, Direct 0.00 - 0.40 mg/dL 0.21 0.23 -    Lab Results  Component Value Date/Time   TSH 3.43 09/08/2020 08:14 AM   TSH 2.93 07/09/2018 09:31 AM    CBC Latest Ref Rng & Units 09/08/2020 07/06/2019 07/01/2019  WBC 4.0 - 10.5 K/uL 8.8 10.5 12.6(H)  Hemoglobin 13.0 - 17.0 g/dL 14.5 15.0 13.8  Hematocrit 39.0 - 52.0 % 42.7 44.3 41.1  Platelets 150.0 - 400.0 K/uL 193.0 219.0 172    No results found for: VD25OH  Clinical ASCVD: Yes  The ASCVD Risk score  (Arnett DK, et al., 2019) failed to calculate for the following reasons:   The patient has a prior MI or stroke diagnosis    Depression screen [facility] 2/9 06/07/2021 02/07/2021 05/12/2020  Decreased Interest 0 0 0  Down, Depressed, Hopeless 0 0 0  PHQ - 2 Score 0 0 0  Altered sleeping 0 - -  Tired, decreased energy 0 - -  Change in appetite 0 - -  Feeling bad or failure about yourself  0 - -  Trouble concentrating 0 - -  Moving slowly or fidgety/restless 0 - -  Suicidal thoughts 0 - -  PHQ-9 Score 0 - -  Difficult doing work/chores - - -    Social History   Tobacco Use  Smoking Status Never  Smokeless Tobacco Never   BP Readings from Last 3 Encounters:  04/17/21 122/66  02/07/21 100/70  09/07/20 120/78   Pulse Readings from Last 3 Encounters:  04/17/21 (!) 57  02/07/21 (!) 59  09/07/20 68   Wt Readings from Last 3 Encounters:  04/17/21 208 lb (94.3 kg)  02/07/21 204 lb 3.2 oz (92.6 kg)  09/07/20 199 lb (90.3 kg)   BMI Readings from Last 3 Encounters:  04/17/21 28.21 kg/m  02/07/21 28.48 kg/m  09/07/20 27.75 kg/m    Assessment/Interventions: Review of patient past medical history, allergies, medications, health status, including review of consultants reports, laboratory and other test data, was performed as part of comprehensive evaluation and provision of chronic care management services.   SDOH:  (Social Determinants of Health) assessments and interventions performed: Yes  SDOH Screenings   Alcohol Screen: Low Risk    Last Alcohol Screening Score (AUDIT): 4  Depression (PHQ2-9): Low Risk    PHQ-2 Score: 0  Financial Resource Strain: Low Risk    Difficulty of Paying Living Expenses: Not hard at all  Food Insecurity: No Food Insecurity   Worried About Charity fundraiser in the Last Year: Never true   Ran Out of Food in the Last Year: Never true  Housing: Low Risk    Last Housing Risk Score: 0  Physical Activity: Inactive   Days of Exercise per Week: 0 days    Minutes of Exercise per Session: 0 min  Social Connections: Engineer, building services of Communication with Friends and Family: More than three times a week   Frequency of Social Gatherings with Friends and Family: More than three times a week   Attends Religious Services: More than 4 times per year   Active Member of [employer] or Organizations: Yes   Attends Music therapist: More than 4 times per year   Marital Status: Married  Stress: No Stress Concern Present   Feeling of Stress : Not at all  Tobacco Use: Low Risk    Smoking  Tobacco Use: Never   Smokeless Tobacco Use: Never   Passive Exposure: Not on file  Transportation Needs: No Transportation Needs   Lack of Transportation (Medical): No   Lack of Transportation (Non-Medical): No    CCM Care Plan  Allergies  Allergen Reactions   Crestor [Rosuvastatin Calcium] Other (See Comments)    Muscle aches   Lactose Intolerance (Gi) Diarrhea    Medications Reviewed Today     Reviewed by Tomasa Blase, [facility] (Pharmacist) on 12/04/21 at 1105  Med List Status: <None>   Medication Order Taking? Sig Documenting Provider Last Dose Status Informant  aspirin EC 81 MG tablet [id number] Yes Take 1 tablet (81 mg total) by mouth daily. Janith Lima, MD Taking Active Self           Med Note Armando Gang Feb 29, 2020  8:05 AM)    atorvastatin (LIPITOR) 80 MG tablet [id number] Yes TAKE 1 TABLET BY MOUTH EVERY DAY AT 6 PM Lorretta Harp, MD Taking Active   Calcium Carbonate (CALCIUM 500 PO) [id number] Yes Take 1 tablet by mouth once a week. [provider] Taking Active   carvedilol (COREG) 6.25 MG tablet [id number] Yes TAKE 1 TABLET BY MOUTH 2 TIMES DAILY WITH A MEAL. Lorretta Harp, MD Taking Active   clopidogrel (PLAVIX) 75 MG tablet [id number] Yes TAKE 1 TABLET BY MOUTH EVERY DAY Lorretta Harp, MD Taking Active   Multiple Vitamins-Minerals (CENTRUM SILVER 50+MEN PO) [id number] Yes Take 1 tablet by  mouth daily. [provider] Taking Active Self  nitroGLYCERIN (NITROSTAT) 0.4 MG SL tablet [id number]  Place 1 tablet (0.4 mg total) under the tongue every 5 (five) minutes as needed for chest pain. 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   CAD S/P percutaneous coronary angioplasty 02/25/2017   Ischemic cardiomyopathy 02/25/2017   Primary osteoarthritis of both knees 05/31/2015   BPH (benign prostatic hyperplasia) 02/13/2015   Dyslipidemia, goal LDL below 70 07/16/2013   Routine general medical examination at a health care facility 06/18/2013    Immunization History  Administered Date(s) Administered   Fluad Quad(high Dose 65+) 07/27/2019, 09/07/2020   Influenza Split 09/25/2012   Influenza, High Dose Seasonal PF 10/15/2016, 10/16/2017, 09/09/2018   Influenza,inj,Quad PF,6+ Mos 10/18/2013   PFIZER(Purple Top)SARS-COV-2 Vaccination 01/09/2020, 02/02/2020, 10/15/2020   Pneumococcal Conjugate-13 05/31/2015   Pneumococcal Polysaccharide-23 06/18/2013, 12/14/2018   Td 11/15/2008   Tdap 07/06/2019   Zoster Recombinat (Shingrix) 11/01/2020, 01/16/2021   Zoster, Live 07/16/2013    Conditions to be addressed/monitored:  Hypertension, Hyperlipidemia, Coronary Artery Disease, and Depression  Care Plan : CCM Care Plan  Updates made by Tomasa Blase, RPH since 12/04/2021 12:00 AM     Problem: HLD, CAD, Previous STEMI, Adjustment Disorder   Priority: High  Onset Date: 06/07/2021     Long-Range Goal: Disease Management   Start Date: 06/07/2021   Expected End Date: 12/04/2022  This Visit's Progress: On track  Recent Progress: On track  Priority: High  Note:   Current Barriers:  Unable to independently monitor therapeutic efficacy  Pharmacist Clinical Goal(s):  Patient will achieve adherence to monitoring guidelines and medication adherence to achieve therapeutic efficacy maintain control of blood pressure and LDL as evidenced by blood pressure logs and next lipid panel  through collaboration with PharmD and provider.   Interventions: 1:1 collaboration with Janith Lima, MD regarding development and update of comprehensive plan of care as evidenced by provider attestation and co-signature Inter-disciplinary care team collaboration (see longitudinal plan of care) Comprehensive medication review performed; medication list updated in electronic medical record  Hyperlipidemia/ CAD / Previous STEMI x 2 - most recent 06/21/19: (LDL goal < 70) -Controlled -Last LDL 71m/dL (04/12/2021) -Current treatment: Atorvastatin 865mdaily  Clopidogrel 7565maily  Aspirin 1m13mily  Carvedilol 6.25mg66m tablet twice daily  Nitroglycerin 0.4mg S67mablet - 1 tablet under the tongue every 5 minutes as needed  -Medications previously tried: rosuvastatin (causes muscle aches), metoprolol  -Current home readings: 104/63, 98/67 -Current dietary patterns: moderates fatty/fried food intake -Current exercise habits: Limited at this time - plans to increase in the New Year  -Educated on Cholesterol goals;  Benefits of statin for ASCVD risk reduction; Importance of limiting foods high in cholesterol; -Denies hypotensive/hypertensive symptoms -Educated on BP goals and benefits of medications for prevention of heart attack, stroke and kidney damage; Daily salt intake goal < 2300 mg; Exercise goal of 150 minutes per week; Importance of home blood pressure monitoring; Symptoms of hypotension and importance of maintaining adequate hydration; -Counseled  on diet and exercise extensively Recommended to continue current medication  Adjustment disorder (Goal: Mood Control ) -Controlled -Current treatment: Sertraline 50mg d60m  -Medications previously tried/failed: n/a -PHQ9: 0 -Educated on Benefits of medication for symptom control Benefits of cognitive-behavioral therapy with or without medication -Recommended to continue current medication -Patient feels that mood has improved, to continue medication, believes that with increase in activity this will continue to improve mood   Health Maintenance -Vaccine gaps: COVID 19 Booster (4th dose) -Current therapy:  Multivitamin - 1 tablet daily  Calcium carbonate 500mg - 31mblet weekly  -Educated on Cost vs benefit of each product must be carefully weighed by individual consumer -Patient is satisfied with current therapy and denies issues -Recommended to continue current medication  Patient Goals/Self-Care Activities Patient will:  - take medications as prescribed check blood pressure once weekly, document, and provide at future appointments  Follow Up Plan: Telephone follow up appointment with care management team member scheduled for: 8 months  The patient has been provided with contact information for the care management team and has been advised to call with any health related questions or concerns.      Medication Assistance: None required.  Patient affirms current coverage meets needs.  Compliance/Adherence/Medication fill history: Care Gaps: Colonoscopy   Patient's preferred pharmacy is:  [id number] [id number], [place] [address] [phone] box? Yes Pt endorses 100% compliance  Care Plan and Follow Up Patient Decision:  Patient agrees to Care Plan and Follow-up.  Plan: Telephone follow up appointment with care management team member  scheduled for:  8 months  and The patient has been provided with contact information for the care management team and has been advised to call with any health related questions or concerns.   Red Mandt C Tomasa BlaseClinical Pharmacist, [place]  [place]

## 2021-12-04 NOTE — Patient Instructions (Signed)
Visit Information  Following are the goals we discussed today:   Track and Manage My Blood Pressures   Timeframe:  Long-Range Goal Priority:  High Start Date:  06/07/2021                          Expected End Date:  08/08/2022                    Follow Up Date 12/07/2022   - check blood pressure weekly - choose a place to take my blood pressure (home, clinic or office, retail store) - write blood pressure results in a log or diary    Why is this important?   You won't feel high blood pressure, but it can still hurt your blood vessels.  High blood pressure can cause heart or kidney problems. It can also cause a stroke.  Making lifestyle changes like losing a little weight or eating less salt will help.  Checking your blood pressure at home and at different times of the day can help to control blood pressure.  If the doctor prescribes medicine remember to take it the way the doctor ordered.  Call the office if you cannot afford the medicine or if there are questions about it.   Plan: Telephone follow up appointment with care management team member scheduled for:  8 months  The patient has been provided with contact information for the care management team and has been advised to call with any health related questions or concerns.   Tomasa Blase, PharmD Clinical Pharmacist, [facility]   Please call the care guide team at [phone] if you need to cancel or reschedule your appointment.   Patient verbalizes understanding of instructions provided today and agrees to view in [place].

## 2022-01-01 DIAGNOSIS — Z9861 Coronary angioplasty status: Secondary | ICD-10-CM

## 2022-01-01 DIAGNOSIS — I252 Old myocardial infarction: Secondary | ICD-10-CM | POA: Diagnosis not present

## 2022-01-01 DIAGNOSIS — I2510 Atherosclerotic heart disease of native coronary artery without angina pectoris: Secondary | ICD-10-CM

## 2022-01-02 ENCOUNTER — Other Ambulatory Visit: Payer: Self-pay | Admitting: Cardiovascular Disease

## 2022-02-17 ENCOUNTER — Other Ambulatory Visit: Payer: Self-pay | Admitting: Internal Medicine

## 2022-02-17 DIAGNOSIS — F4323 Adjustment disorder with mixed anxiety and depressed mood: Secondary | ICD-10-CM

## 2022-02-22 ENCOUNTER — Ambulatory Visit (INDEPENDENT_AMBULATORY_CARE_PROVIDER_SITE_OTHER): Payer: Medicare Other

## 2022-02-22 ENCOUNTER — Other Ambulatory Visit: Payer: Self-pay

## 2022-02-22 VITALS — Wt 208.0 lb

## 2022-02-22 DIAGNOSIS — Z0000 Encounter for general adult medical examination without abnormal findings: Secondary | ICD-10-CM | POA: Diagnosis not present

## 2022-02-22 NOTE — Patient Instructions (Signed)
John Mccarthy , ?Thank you for taking time to come for your Medicare Wellness Visit. I appreciate your ongoing commitment to your health goals. Please review the following plan we discussed and let me know if I can assist you in the future.  ? ?Screening recommendations/referrals: ?Colonoscopy: Done 11/24/2008 - This is recommended every 10 years - *past due - consider scheduling soon ?Recommended yearly ophthalmology/optometry visit for glaucoma screening and checkup ?Recommended yearly dental visit for hygiene and checkup ? ?Vaccinations: ?Influenza vaccine: Done 09/17/2021 - Repeat annually ?Pneumococcal vaccine: Done 05/31/2015 & 12/14/2018 ?Tdap vaccine: Done 07/06/2019 - Repeat in 10 years ?Shingles vaccine: Done 11/01/2020 & 01/16/2021  ?Covid-19: Done 01/09/2020, 02/02/2020, & 10/15/2020 ? ?Advanced directives: Advance directive discussed with you today. Even though you declined this today, please call our office should you change your mind, and we can give you the proper paperwork for you to fill out.  ? ?Conditions/risks identified: Aim for 30 minutes of exercise or brisk walking, 6-8 glasses of water, and 5 servings of fruits and vegetables each day.  ? ?Next appointment: Follow up in one year for your annual wellness visit.  ? ?Preventive Care 56 Years and Older, Male ? ?Preventive care refers to lifestyle choices and visits with your health care provider that can promote health and wellness. ?What does preventive care include? ?A yearly physical exam. This is also called an annual well check. ?Dental exams once or twice a year. ?Routine eye exams. Ask your health care provider how often you should have your eyes checked. ?Personal lifestyle choices, including: ?Daily care of your teeth and gums. ?Regular physical activity. ?Eating a healthy diet. ?Avoiding tobacco and drug use. ?Limiting alcohol use. ?Practicing safe sex. ?Taking low doses of aspirin every day. ?Taking vitamin and mineral supplements as  recommended by your health care provider. ?What happens during an annual well check? ?The services and screenings done by your health care provider during your annual well check will depend on your age, overall health, lifestyle risk factors, and family history of disease. ?Counseling  ?Your health care provider may ask you questions about your: ?Alcohol use. ?Tobacco use. ?Drug use. ?Emotional well-being. ?Home and relationship well-being. ?Sexual activity. ?Eating habits. ?History of falls. ?Memory and ability to understand (cognition). ?Work and work Statistician. ?Screening  ?You may have the following tests or measurements: ?Height, weight, and BMI. ?Blood pressure. ?Lipid and cholesterol levels. These may be checked every 5 years, or more frequently if you are over 38 years old. ?Skin check. ?Lung cancer screening. You may have this screening every year starting at age 89 if you have a 30-pack-year history of smoking and currently smoke or have quit within the past 15 years. ?Fecal occult blood test (FOBT) of the stool. You may have this test every year starting at age 67. ?Flexible sigmoidoscopy or colonoscopy. You may have a sigmoidoscopy every 5 years or a colonoscopy every 10 years starting at age 48. ?Prostate cancer screening. Recommendations will vary depending on your family history and other risks. ?Hepatitis C blood test. ?Hepatitis B blood test. ?Sexually transmitted disease (STD) testing. ?Diabetes screening. This is done by checking your blood sugar (glucose) after you have not eaten for a while (fasting). You may have this done every 1-3 years. ?Abdominal aortic aneurysm (AAA) screening. You may need this if you are a current or former smoker. ?Osteoporosis. You may be screened starting at age 39 if you are at high risk. ?Talk with your health care provider about your test  results, treatment options, and if necessary, the need for more tests. ?Vaccines  ?Your health care provider may recommend  certain vaccines, such as: ?Influenza vaccine. This is recommended every year. ?Tetanus, diphtheria, and acellular pertussis (Tdap, Td) vaccine. You may need a Td booster every 10 years. ?Zoster vaccine. You may need this after age 51. ?Pneumococcal 13-valent conjugate (PCV13) vaccine. One dose is recommended after age 64. ?Pneumococcal polysaccharide (PPSV23) vaccine. One dose is recommended after age 24. ?Talk to your health care provider about which screenings and vaccines you need and how often you need them. ?This information is not intended to replace advice given to you by your health care provider. Make sure you discuss any questions you have with your health care provider. ?Document Released: 12/15/2015 Document Revised: 08/07/2016 Document Reviewed: 09/19/2015 ?Elsevier Interactive Patient Education ? 2017 [employer]. ? ?Fall Prevention in the Home ?Falls can cause injuries. They can happen to people of all ages. There are many things you can do to make your home safe and to help prevent falls. ?What can I do on the outside of my home? ?Regularly fix the edges of walkways and driveways and fix any cracks. ?Remove anything that might make you trip as you walk through a door, such as a raised step or threshold. ?Trim any bushes or trees on the path to your home. ?Use bright outdoor lighting. ?Clear any walking paths of anything that might make someone trip, such as rocks or tools. ?Regularly check to see if handrails are loose or broken. Make sure that both sides of any steps have handrails. ?Any raised decks and porches should have guardrails on the edges. ?Have any leaves, snow, or ice cleared regularly. ?Use sand or salt on walking paths during winter. ?Clean up any spills in your garage right away. This includes oil or grease spills. ?What can I do in the bathroom? ?Use night lights. ?Install grab bars by the toilet and in the tub and shower. Do not use towel bars as grab bars. ?Use non-skid mats or  decals in the tub or shower. ?If you need to sit down in the shower, use a plastic, non-slip stool. ?Keep the floor dry. Clean up any water that spills on the floor as soon as it happens. ?Remove soap buildup in the tub or shower regularly. ?Attach bath mats securely with double-sided non-slip rug tape. ?Do not have throw rugs and other things on the floor that can make you trip. ?What can I do in the bedroom? ?Use night lights. ?Make sure that you have a light by your bed that is easy to reach. ?Do not use any sheets or blankets that are too big for your bed. They should not hang down onto the floor. ?Have a firm chair that has side arms. You can use this for support while you get dressed. ?Do not have throw rugs and other things on the floor that can make you trip. ?What can I do in the kitchen? ?Clean up any spills right away. ?Avoid walking on wet floors. ?Keep items that you use a lot in easy-to-reach places. ?If you need to reach something above you, use a strong step stool that has a grab bar. ?Keep electrical cords out of the way. ?Do not use floor polish or wax that makes floors slippery. If you must use wax, use non-skid floor wax. ?Do not have throw rugs and other things on the floor that can make you trip. ?What can I do with my stairs? ?  Do not leave any items on the stairs. ?Make sure that there are handrails on both sides of the stairs and use them. Fix handrails that are broken or loose. Make sure that handrails are as long as the stairways. ?Check any carpeting to make sure that it is firmly attached to the stairs. Fix any carpet that is loose or worn. ?Avoid having throw rugs at the top or bottom of the stairs. If you do have throw rugs, attach them to the floor with carpet tape. ?Make sure that you have a light switch at the top of the stairs and the bottom of the stairs. If you do not have them, ask someone to add them for you. ?What else can I do to help prevent falls? ?Wear shoes that: ?Do not  have high heels. ?Have rubber bottoms. ?Are comfortable and fit you well. ?Are closed at the toe. Do not wear sandals. ?If you use a stepladder: ?Make sure that it is fully opened. Do not climb a closed stepladder.

## 2022-02-22 NOTE — Progress Notes (Signed)
? ?Subjective:  ? John Mccarthy is a 71 y.o. male who presents for Medicare Annual/Subsequent preventive examination. ? ?Virtual Visit via Telephone Note ? ?I connected with  John Mccarthy on 02/22/22 at  1:00 PM EDT by telephone and verified that I am speaking with the correct person using two identifiers. ? ?Location: ?Patient: Home ?Provider: [facility] ?Persons participating in the virtual visit: patient/Nurse Health Advisor ?  ?I discussed the limitations, risks, security and privacy concerns of performing an evaluation and management service by telephone and the availability of in person appointments. The patient expressed understanding and agreed to proceed. ? ?Interactive audio and video telecommunications were attempted between this nurse and patient, however failed, due to patient having technical difficulties OR patient did not have access to video capability.  We continued and completed visit with audio only. ? ?Some vital signs may be absent or patient reported.  ? ?Perlie Scheuring Dionne Ano, LPN  ? ?Review of Systems    ? ?Cardiac Risk Factors include: advanced age (>76mn, >>79women);male gender;dyslipidemia;family history of premature cardiovascular disease;sedentary lifestyle;Other (see comment), Risk factor comments: hx of P.E., CAD, Cardiomyopathy ? ?   ?Objective:  ?  ?Today's Vitals  ? 02/22/22 1332  ?Weight: 208 lb (94.3 kg)  ? ?Body mass index is 28.21 kg/m?. ? ? ?  02/22/2022  ?  1:42 PM 02/07/2021  ?  8:27 AM 03/14/2020  ? 10:13 AM 06/29/2019  ? 12:18 AM 04/08/2017  ?  4:47 PM 02/13/2017  ? 10:23 AM 10/16/2016  ? 12:49 PM  ?Advanced Directives  ?Does Patient Have a Medical Advance Directive? No No No No No No Yes  ?Type of [place]of [place] will  ?Does patient want to make changes to medical advance directive?       No - Patient declined  ?Copy of [place]?       Yes  ?Would patient like information on creating a  medical advance directive? No - Patient declined No - Patient declined Yes (MAU/Ambulatory/Procedural Areas - Information given) Yes (Inpatient - patient defers creating a medical advance directive and declines information at this time) No - Patient declined No - Patient declined   ? ? ?Current Medications (verified) ?Outpatient Encounter Medications as of 02/22/2022  ?Medication Sig  ? aspirin EC 81 MG tablet Take 1 tablet (81 mg total) by mouth daily.  ? atorvastatin (LIPITOR) 80 MG tablet TAKE 1 TABLET BY MOUTH EVERY DAY AT 6 PM  ? Calcium Carbonate (CALCIUM 500 PO) Take 1 tablet by mouth once a week.  ? carvedilol (COREG) 6.25 MG tablet TAKE 1 TABLET BY MOUTH 2 TIMES DAILY WITH A MEAL.  ? clopidogrel (PLAVIX) 75 MG tablet TAKE 1 TABLET BY MOUTH EVERY DAY  ? Multiple Vitamins-Minerals (CENTRUM SILVER 50+MEN PO) Take 1 tablet by mouth daily.  ? sertraline (ZOLOFT) 50 MG tablet TAKE 1 TABLET BY MOUTH EVERY DAY  ? nitroGLYCERIN (NITROSTAT) 0.4 MG SL tablet Place 1 tablet (0.4 mg total) under the tongue every 5 (five) minutes as needed for chest pain.  ? ?No facility-administered encounter medications on file as of 02/22/2022.  ? ? ?Allergies (verified) ?Crestor [rosuvastatin calcium] and Lactose intolerance (gi)  ? ?History: ?Past Medical History:  ?Diagnosis Date  ? BPH (benign prostatic hyperplasia)   ? CAD S/P percutaneous coronary angioplasty 2018, 2020  ? anterior STEMI twice- LAD DES  ? GI bleeding 2013  ?  Hyperlipidemia   ? Pulmonary embolism ([place]) 06/15/2013  ? after achilles tendon repair- Xarelto 6 months  ? Skin cancer (melanoma) ([place])   ? ?Past Surgical History:  ?Procedure Laterality Date  ? ACHILLES TENDON SURGERY Right 05/18/2013  ? CARDIAC CATHETERIZATION    ? CORONARY STENT INTERVENTION N/A 02/11/2017  ? Procedure: Coronary Stent Intervention;  Surgeon: Lorretta Harp, MD;  Location: [place] [facility];  Service: Cardiovascular;  Laterality: N/A;  ? CORONARY/GRAFT ACUTE MI REVASCULARIZATION N/A  06/28/2019  ? Procedure: Coronary/Graft Acute MI Revascularization;  Surgeon: Lorretta Harp, MD;  Location: [place];  Service: Cardiovascular;  Laterality: N/A;  ? ESOPHAGOGASTRODUODENOSCOPY  09/24/2012  ? Procedure: ESOPHAGOGASTRODUODENOSCOPY (EGD);  Surgeon: Lear Ng, MD;  Location[facility] ENDOSCOPY;  Service: Endoscopy;  Laterality: N/A;  ? HAND SURGERY    ? LEFT HEART CATH AND CORONARY ANGIOGRAPHY N/A 02/11/2017  ? Procedure: Left Heart Cath and Coronary Angiography;  Surgeon: Lorretta Harp, MD;  Location: [place] [facility];  Service: Cardiovascular;  Laterality: N/A;  ? LEFT HEART CATH AND CORONARY ANGIOGRAPHY N/A 06/28/2019  ? Procedure: LEFT HEART CATH AND CORONARY ANGIOGRAPHY;  Surgeon: Lorretta Harp, MD;  Location: [place] [facility];  Service: Cardiovascular;  Laterality: N/A;  ? NOSE SURGERY  08/11/2019  ? removal of melenoma   ? ?Family History  ?Problem Relation Age of Onset  ? Heart failure Father   ? Coronary artery disease Sister 42  ? Cancer Neg Hx   ? COPD Neg Hx   ? Stroke Neg Hx   ? Kidney disease Neg Hx   ? Hypertension Neg Hx   ? Hyperlipidemia Neg Hx   ? Heart disease Neg Hx   ? Hearing loss Neg Hx   ? Early death Neg Hx   ? Drug abuse Neg Hx   ? Diabetes Neg Hx   ? Depression Neg Hx   ? ?Social History  ? ?Socioeconomic History  ? Marital status: Married  ?  Spouse name: John Mccarthy  ? Number of children: Not on file  ? Years of education: Not on file  ? Highest education level: Not on file  ?Occupational History  ? Occupation: retired  ?Tobacco Use  ? Smoking status: Never  ? Smokeless tobacco: Never  ?Vaping Use  ? Vaping Use: Never used  ?Substance and Sexual Activity  ? Alcohol use: Yes  ?  Alcohol/week: 5.0 standard drinks  ?  Types: 5 Shots of liquor per week  ?  Comment: occassional  ? Drug use: No  ? Sexual activity: Yes  ?  Birth control/protection: None  ?Other Topics Concern  ? Not on file  ?Social History Narrative  ? Lives in 2 story home with wife  ?  Children live nearby  ? ?Social Determinants of Health  ? ?Financial Resource Strain: Low Risk   ? Difficulty of Paying Living Expenses: Not hard at all  ?Food Insecurity: No Food Insecurity  ? Worried About Charity fundraiser in the Last Year: Never true  ? Ran Out of Food in the Last Year: Never true  ?Transportation Needs: No Transportation Needs  ? Lack of Transportation (Medical): No  ? Lack of Transportation (Non-Medical): No  ?Physical Activity: Insufficiently Active  ? Days of Exercise per Week: 3 days  ? Minutes of Exercise per Session: 30 min  ?Stress: No Stress Concern Present  ? Feeling of Stress : Not at all  ?Social Connections: Socially Integrated  ? Frequency  of Communication with Friends and Family: More than three times a week  ? Frequency of Social Gatherings with Friends and Family: More than three times a week  ? Attends Religious Services: More than 4 times per year  ? Active Member of Clubs or Organizations: Yes  ? Attends Archivist Meetings: More than 4 times per year  ? Marital Status: Married  ? ? ?Tobacco Counseling ?Counseling given: Not Answered ? ? ?Clinical Intake: ? ?Pre-visit preparation completed: Yes ? ?Pain : No/denies pain ? ?  ? ?BMI - recorded: 28.21 ?Nutritional Status: BMI 25 -29 Overweight ?Nutritional Risks: None ?Diabetes: No ? ?How often do you need to have someone help you when you read instructions, pamphlets, or other written materials from your doctor or pharmacy?: 1 - Never ? ?Diabetic? no ? ?Interpreter Needed?: No ? ?Information entered by :: Evian Derringer, LPN ? ? ?Activities of Daily Living ? ?  02/22/2022  ?  1:37 PM  ?In your present state of health, do you have any difficulty performing the following activities:  ?Hearing? 0  ?Vision? 0  ?Difficulty concentrating or making decisions? 0  ?Walking or climbing stairs? 0  ?Dressing or bathing? 0  ?Doing errands, shopping? 0  ?Preparing Food and eating ? N  ?Using the Toilet? N  ?In the past six months,  have you accidently leaked urine? N  ?Do you have problems with loss of bowel control? N  ?Managing your Medications? N  ?Managing your Finances? N  ?Housekeeping or managing your Housekeeping? N  ? ? ?Patient Care Team:

## 2022-05-04 ENCOUNTER — Other Ambulatory Visit: Payer: Self-pay | Admitting: Cardiovascular Disease

## 2022-05-14 ENCOUNTER — Other Ambulatory Visit: Payer: Self-pay | Admitting: Internal Medicine

## 2022-05-14 DIAGNOSIS — F4323 Adjustment disorder with mixed anxiety and depressed mood: Secondary | ICD-10-CM

## 2022-06-11 ENCOUNTER — Other Ambulatory Visit: Payer: Self-pay | Admitting: Internal Medicine

## 2022-06-11 ENCOUNTER — Encounter: Payer: Self-pay | Admitting: Internal Medicine

## 2022-06-11 DIAGNOSIS — F4323 Adjustment disorder with mixed anxiety and depressed mood: Secondary | ICD-10-CM

## 2022-06-12 ENCOUNTER — Other Ambulatory Visit: Payer: Self-pay | Admitting: Internal Medicine

## 2022-06-12 DIAGNOSIS — F4323 Adjustment disorder with mixed anxiety and depressed mood: Secondary | ICD-10-CM

## 2022-06-12 MED ORDER — SERTRALINE HCL 50 MG PO TABS: 50.0000 mg | ORAL_TABLET | Freq: Every day | ORAL | 0 refills | Status: DC

## 2022-06-20 ENCOUNTER — Ambulatory Visit (INDEPENDENT_AMBULATORY_CARE_PROVIDER_SITE_OTHER): Payer: Medicare Other | Admitting: Internal Medicine

## 2022-06-20 ENCOUNTER — Encounter: Payer: Self-pay | Admitting: Internal Medicine

## 2022-06-20 VITALS — BP 110/64 | HR 58 | Temp 97.9°F | Ht 72.0 in | Wt 211.0 lb

## 2022-06-20 DIAGNOSIS — N400 Enlarged prostate without lower urinary tract symptoms: Secondary | ICD-10-CM

## 2022-06-20 DIAGNOSIS — I2510 Atherosclerotic heart disease of native coronary artery without angina pectoris: Secondary | ICD-10-CM

## 2022-06-20 DIAGNOSIS — B356 Tinea cruris: Secondary | ICD-10-CM

## 2022-06-20 DIAGNOSIS — F3342 Major depressive disorder, recurrent, in full remission: Secondary | ICD-10-CM | POA: Diagnosis not present

## 2022-06-20 DIAGNOSIS — R001 Bradycardia, unspecified: Secondary | ICD-10-CM

## 2022-06-20 DIAGNOSIS — Z9861 Coronary angioplasty status: Secondary | ICD-10-CM | POA: Diagnosis not present

## 2022-06-20 DIAGNOSIS — E785 Hyperlipidemia, unspecified: Secondary | ICD-10-CM | POA: Diagnosis not present

## 2022-06-20 DIAGNOSIS — Z1211 Encounter for screening for malignant neoplasm of colon: Secondary | ICD-10-CM

## 2022-06-20 LAB — CBC WITH DIFFERENTIAL/PLATELET
Basophils Absolute: 0.2 10*3/uL — ABNORMAL HIGH (ref 0.0–0.1)
Basophils Relative: 1.3 % (ref 0.0–3.0)
Eosinophils Absolute: 0.2 10*3/uL (ref 0.0–0.7)
Eosinophils Relative: 1.6 % (ref 0.0–5.0)
HCT: 44.5 % (ref 39.0–52.0)
Hemoglobin: 15 g/dL (ref 13.0–17.0)
Lymphocytes Relative: 18.1 % (ref 12.0–46.0)
Lymphs Abs: 2.1 10*3/uL (ref 0.7–4.0)
MCHC: 33.7 g/dL (ref 30.0–36.0)
MCV: 89.3 fl (ref 78.0–100.0)
Monocytes Absolute: 1 10*3/uL (ref 0.1–1.0)
Monocytes Relative: 8.8 % (ref 3.0–12.0)
Neutro Abs: 8.1 10*3/uL — ABNORMAL HIGH (ref 1.4–7.7)
Neutrophils Relative %: 70.2 % (ref 43.0–77.0)
Platelets: 194 10*3/uL (ref 150.0–400.0)
RBC: 4.98 Mil/uL (ref 4.22–5.81)
RDW: 14.2 % (ref 11.5–15.5)
WBC: 11.6 10*3/uL — ABNORMAL HIGH (ref 4.0–10.5)

## 2022-06-20 LAB — URINALYSIS, ROUTINE W REFLEX MICROSCOPIC
Bilirubin Urine: NEGATIVE
Hgb urine dipstick: NEGATIVE
Ketones, ur: NEGATIVE
Leukocytes,Ua: NEGATIVE
Nitrite: NEGATIVE
RBC / HPF: NONE SEEN (ref 0–?)
Specific Gravity, Urine: 1.015 (ref 1.000–1.030)
Total Protein, Urine: NEGATIVE
Urine Glucose: NEGATIVE
Urobilinogen, UA: 0.2 (ref 0.0–1.0)
pH: 6 (ref 5.0–8.0)

## 2022-06-20 LAB — HEPATIC FUNCTION PANEL
ALT: 22 U/L (ref 0–53)
AST: 27 U/L (ref 0–37)
Albumin: 4.3 g/dL (ref 3.5–5.2)
Alkaline Phosphatase: 86 U/L (ref 39–117)
Bilirubin, Direct: 0.1 mg/dL (ref 0.0–0.3)
Total Bilirubin: 0.8 mg/dL (ref 0.2–1.2)
Total Protein: 6.9 g/dL (ref 6.0–8.3)

## 2022-06-20 LAB — BASIC METABOLIC PANEL
BUN: 15 mg/dL (ref 6–23)
CO2: 25 mEq/L (ref 19–32)
Calcium: 9 mg/dL (ref 8.4–10.5)
Chloride: 103 mEq/L (ref 96–112)
Creatinine, Ser: 1.05 mg/dL (ref 0.40–1.50)
GFR: 70.62 mL/min (ref >60.00)
Glucose, Bld: 101 mg/dL — ABNORMAL HIGH (ref 70–99)
Potassium: 4.5 mEq/L (ref 3.5–5.1)
Sodium: 139 mEq/L (ref 135–145)

## 2022-06-20 LAB — LIPID PANEL
Cholesterol: 130 mg/dL (ref 0–200)
HDL: 37.5 mg/dL — ABNORMAL LOW (ref >39.00)
LDL Cholesterol: 61 mg/dL (ref 0–99)
NonHDL: 92.01
Total CHOL/HDL Ratio: 3
Triglycerides: 153 mg/dL — ABNORMAL HIGH (ref 0.0–149.0)
VLDL: 30.6 mg/dL (ref 0.0–40.0)

## 2022-06-20 LAB — PSA: PSA: 1.1 ng/mL (ref 0.10–4.00)

## 2022-06-20 LAB — TSH: TSH: 4.29 u[IU]/mL (ref 0.35–5.50)

## 2022-06-20 MED ORDER — CICLOPIROX OLAMINE 0.77 % EX CREA: TOPICAL_CREAM | Freq: Two times a day (BID) | CUTANEOUS | 1 refills | Status: DC

## 2022-06-20 MED ORDER — SERTRALINE HCL 50 MG PO TABS
50.0000 mg | ORAL_TABLET | Freq: Every day | ORAL | 0 refills | Status: DC
Start: 2022-06-20 — End: 2022-06-20

## 2022-06-20 MED ORDER — SERTRALINE HCL 50 MG PO TABS: 50.0000 mg | ORAL_TABLET | Freq: Every day | ORAL | 1 refills | Status: DC

## 2022-06-20 NOTE — Patient Instructions (Signed)
Jock Itch Jock itch (tinea cruris) is an infection of the skin in the groin area that is caused by a fungus. Jock itch causes an itchy rash in the groin and upper thigh area. It usually goes away in 2-3 weeks with treatment. What are the causes? The fungus that causes jock itch may be spread by: Touching a fungal infection elsewhere on your body, such as athlete's foot, and then touching your groin area. Sharing towels or clothing, such as socks or shoes, with someone who has a fungal infection. What increases the risk? Jock itch is most common in men and adolescent boys. You are also more likely to develop the condition if you: Are in a hot, humid climate. Wear tight-fitting clothing or wet bathing suits for long periods of time. Play sports. Are overweight. Have diabetes. Have a weakened body defense system (immune system). Sweat a lot. What are the signs or symptoms? Symptoms of jock itch may include: A red, pink, or brown rash in the groin area. Blisters may be present. The rash may spread to the thighs, the opening between the buttocks (anus), and the buttocks. Dry and scaly skin on or around the rash. Itchiness. How is this diagnosed? In most cases, your health care provider can make the diagnosis by looking at your rash. In some cases, a sample of infected skin may be scraped off. This sample may be examined under a microscope (biopsy) or by trying to grow the fungus from the sample (culture). How is this treated? Treatment for this condition may include: Antifungal medicine to kill the fungus. This may be a skin cream, ointment, or powder, or it may be a medicine that you take by mouth (orally). Skin cream or ointment to reduce itching. Lifestyle changes, such as wearing looser clothing and caring for your skin. Follow these instructions at home: Skin care Apply skin creams, ointments, or powders exactly as told by your health care provider. Wear loose-fitting clothing that does  not rub against your groin area. Men should wear boxer shorts or loose-fitting underwear. Keep your groin area clean and dry. Change your underwear every day. Change out of wet bathing suits as soon as possible. After bathing, use a separate towel to gently dry your groin area thoroughly. Using a separate towel will help prevent spreading the infection to other parts of your body. Avoid hot baths and showers. Hot water can make itching worse. Do not scratch the affected area. General instructions Take and apply over-the-counter and prescription medicines only as told by your health care provider. Do not share towels, clothing, or personal items with other people. Wash your hands often with soap and water for at least 20 seconds, especially after touching your groin area. If soap and water are not available, use hand sanitizer. When at the gym: Always wear shoes, especially in the shower and around the swimming pool. Keep any cuts covered. Disinfect any mats or equipment before using them. Shower immediately after working out. Keep all follow-up visits. This is important. Contact a health care provider if: Your rash: Gets worse or does not get better after 2 weeks of treatment. Spreads. Returns after treatment is finished. You have any of the following: A fever. New or worsening redness, swelling, or pain around your rash. Fluid, blood, or pus coming from your rash. Summary Jock itch (tinea cruris) is a fungal infection of the skin in the groin area. The fungus can be spread by sharing clothing or by touching a fungus infection   elsewhere on your body and then touching your groin area. Treatment may include antifungal medicine and lifestyle changes, such as keeping the area clean and dry. This information is not intended to replace advice given to you by your health care provider. Make sure you discuss any questions you have with your health care provider. Document Revised: 02/06/2021  Document Reviewed: 02/06/2021 Elsevier Patient Education  2023 [employer]  

## 2022-06-20 NOTE — Progress Notes (Signed)
Subjective:  Patient ID: John Mccarthy, male    DOB: [birth year]  Age: 71 y.o. MRN: [id number]  CC: Hyperlipidemia and Rash   HPI [name] presents for f/up   He has not been exercising much because he is afraid that he will damage his heart.  He has rare twinges of chest pain but denies shortness of breath, diaphoresis, palpitations, dizziness, lightheadedness, or edema.  He wants to stay on the current dose of sertraline.  Outpatient Medications Prior to Visit  Medication Sig Dispense Refill   aspirin EC 81 MG tablet Take 1 tablet (81 mg total) by mouth daily. 90 tablet 3   atorvastatin (LIPITOR) 80 MG tablet TAKE 1 TABLET BY MOUTH EVERY DAY AT 6 PM 90 tablet 3   Calcium Carbonate (CALCIUM 500 PO) Take 1 tablet by mouth once a week.     carvedilol (COREG) 6.25 MG tablet TAKE 1 TABLET BY MOUTH 2 TIMES DAILY WITH A MEAL. 180 tablet 3   clopidogrel (PLAVIX) 75 MG tablet TAKE 1 TABLET BY MOUTH EVERY DAY 90 tablet 1   Multiple Vitamins-Minerals (CENTRUM SILVER 50+MEN PO) Take 1 tablet by mouth daily.     sertraline (ZOLOFT) 50 MG tablet Take 1 tablet (50 mg total) by mouth daily. 90 tablet 0   nitroGLYCERIN (NITROSTAT) 0.4 MG SL tablet Place 1 tablet (0.4 mg total) under the tongue every 5 (five) minutes as needed for chest pain. 25 tablet 3   No facility-administered medications prior to visit.    ROS Review of Systems  Constitutional: Negative.  Negative for diaphoresis, fatigue and unexpected weight change.  HENT: Negative.  Negative for sore throat.   Eyes: Negative.   Cardiovascular:  Positive for chest pain. Negative for palpitations and leg swelling.  Gastrointestinal:  Negative for abdominal pain, constipation, diarrhea, nausea and vomiting.  Genitourinary:  Positive for scrotal swelling. Negative for difficulty urinating, dysuria, flank pain, frequency, hematuria and testicular pain.  Musculoskeletal: Negative.  Negative for arthralgias and joint swelling.   Skin:  Positive for rash.  Neurological:  Negative for dizziness, weakness and light-headedness.  Hematological:  Negative for adenopathy. Does not bruise/bleed easily.  Psychiatric/Behavioral: Negative.      Objective:  BP 110/64 (BP Location: Left Arm, Patient Position: Sitting, Cuff Size: Large)   Pulse (!) 58   Temp 97.9 F (36.6 C) (Oral)   Ht 6' (1.829 m)   Wt 211 lb (95.7 kg)   SpO2 94%   BMI 28.62 kg/m   BP Readings from Last 3 Encounters:  06/20/22 110/64  04/17/21 122/66  02/07/21 100/70    Wt Readings from Last 3 Encounters:  06/20/22 211 lb (95.7 kg)  02/22/22 208 lb (94.3 kg)  04/17/21 208 lb (94.3 kg)    Physical Exam Vitals reviewed.  Constitutional:      Appearance: Normal appearance.  HENT:     Nose: Nose normal.     Mouth/Throat:     Mouth: Mucous membranes are moist.  Eyes:     General: No scleral icterus.    Conjunctiva/sclera: Conjunctivae normal.  Cardiovascular:     Rate and Rhythm: Bradycardia present.     Heart sounds: Normal heart sounds, S1 normal and S2 normal. No murmur heard.    Comments: EKG - NSR, 61 bpm Low voltage Septal infract is old unchanged Pulmonary:     Effort: Pulmonary effort is normal.     Breath sounds: No stridor. No wheezing, rhonchi or rales.  Abdominal:  General: Abdomen is flat.     Palpations: There is no mass.     Tenderness: There is no abdominal tenderness. There is no guarding.     Hernia: No hernia is present. There is no hernia in the left inguinal area or right inguinal area.  Genitourinary:    Pubic Area: Rash present.     Penis: Normal and circumcised.      Testes:        Right: Mass, tenderness or varicocele not present.        Left: Testicular hydrocele present. Mass or tenderness not present.     Epididymis:     Right: Normal.     Left: Normal.     Prostate: Enlarged. Not tender and no nodules present.     Rectum: Normal. Guaiac result negative. No mass, tenderness, anal fissure,  external hemorrhoid or internal hemorrhoid. Normal anal tone.    Musculoskeletal:        General: Normal range of motion.     Cervical back: Neck supple.     Right lower leg: No edema.     Left lower leg: No edema.  Lymphadenopathy:     Cervical: No cervical adenopathy.     Lower Body: No right inguinal adenopathy. No left inguinal adenopathy.  Skin:    Findings: Erythema and rash present. Rash is scaling. Rash is not crusting, papular, pustular or vesicular.  Neurological:     General: No focal deficit present.     Mental Status: He is alert.  Psychiatric:        Mood and Affect: Mood normal.        Behavior: Behavior normal.     Lab Results  Component Value Date   WBC 11.6 (H) 06/20/2022   HGB 15.0 06/20/2022   HCT 44.5 06/20/2022   PLT 194.0 06/20/2022   GLUCOSE 101 (H) 06/20/2022   CHOL 130 06/20/2022   TRIG 153.0 (H) 06/20/2022   HDL 37.50 (L) 06/20/2022   LDLDIRECT 146.0 10/15/2016   LDLCALC 61 06/20/2022   ALT 22 06/20/2022   AST 27 06/20/2022   NA 139 06/20/2022   K 4.5 06/20/2022   CL 103 06/20/2022   CREATININE 1.05 06/20/2022   BUN 15 06/20/2022   CO2 25 06/20/2022   TSH 4.29 06/20/2022   PSA 1.10 06/20/2022   INR 1.3 (H) 06/29/2019   HGBA1C 5.5 10/15/2016    No results found.  Assessment & Plan:   Lorence was seen today for hyperlipidemia and rash.  Diagnoses and all orders for this visit:  Dyslipidemia, goal LDL below 70- LDL goal achieved. Doing well on the statin  -     Basic metabolic panel; Future -     Lipid panel; Future -     CBC with Differential/Platelet; Future -     TSH; Future -     Hepatic function panel; Future -     Hepatic function panel -     TSH -     CBC with Differential/Platelet -     Lipid panel -     Basic metabolic panel  Screen for colon cancer -     Cologuard  Bradycardia- He has no sx's related to this. -     Basic metabolic panel; Future -     TSH; Future -     EKG 12-Lead -     TSH -     Basic  metabolic panel  Benign prostatic hyperplasia without lower urinary tract symptoms- His  PSA is normal. -     PSA; Future -     Urinalysis, Routine w reflex microscopic; Future -     Urinalysis, Routine w reflex microscopic -     PSA  Tinea cruris -     ciclopirox (LOPROX) 0.77 % cream; Apply topically 2 (two) times daily.  Recurrent major depressive disorder, in full remission ([place]) -     sertraline (ZOLOFT) 50 MG tablet; Take 1 tablet (50 mg total) by mouth daily.  Other orders -     Discontinue: sertraline (ZOLOFT) 50 MG tablet; Take 1 tablet (50 mg total) by mouth daily.   I am having John Mccarthy start on ciclopirox. I am also having him maintain his aspirin EC, Multiple Vitamins-Minerals (CENTRUM SILVER 50+MEN PO), nitroGLYCERIN, Calcium Carbonate (CALCIUM 500 PO), carvedilol, atorvastatin, clopidogrel, and sertraline.  Meds ordered this encounter  Medications   DISCONTD: sertraline (ZOLOFT) 50 MG tablet    Sig: Take 1 tablet (50 mg total) by mouth daily.    Dispense:  90 tablet    Refill:  0   ciclopirox (LOPROX) 0.77 % cream    Sig: Apply topically 2 (two) times daily.    Dispense:  90 g    Refill:  1   sertraline (ZOLOFT) 50 MG tablet    Sig: Take 1 tablet (50 mg total) by mouth daily.    Dispense:  90 tablet    Refill:  1     Follow-up: Return in about 6 months (around 12/21/2022).  Scarlette Calico, MD

## 2022-07-16 DIAGNOSIS — C44519 Basal cell carcinoma of skin of other part of trunk: Secondary | ICD-10-CM | POA: Diagnosis not present

## 2022-07-16 DIAGNOSIS — D225 Melanocytic nevi of trunk: Secondary | ICD-10-CM | POA: Diagnosis not present

## 2022-07-16 DIAGNOSIS — L814 Other melanin hyperpigmentation: Secondary | ICD-10-CM | POA: Diagnosis not present

## 2022-07-16 DIAGNOSIS — L821 Other seborrheic keratosis: Secondary | ICD-10-CM | POA: Diagnosis not present

## 2022-07-16 DIAGNOSIS — L570 Actinic keratosis: Secondary | ICD-10-CM | POA: Diagnosis not present

## 2022-07-16 DIAGNOSIS — D2261 Melanocytic nevi of right upper limb, including shoulder: Secondary | ICD-10-CM | POA: Diagnosis not present

## 2022-07-16 DIAGNOSIS — L905 Scar conditions and fibrosis of skin: Secondary | ICD-10-CM | POA: Diagnosis not present

## 2022-07-16 DIAGNOSIS — Z85828 Personal history of other malignant neoplasm of skin: Secondary | ICD-10-CM | POA: Diagnosis not present

## 2022-07-17 DIAGNOSIS — Z1211 Encounter for screening for malignant neoplasm of colon: Secondary | ICD-10-CM | POA: Diagnosis not present

## 2022-07-25 LAB — COLOGUARD
COLOGUARD: POSITIVE — AB
Cologuard: POSITIVE — AB

## 2022-07-26 ENCOUNTER — Other Ambulatory Visit: Payer: Self-pay | Admitting: Internal Medicine

## 2022-07-26 DIAGNOSIS — R195 Other fecal abnormalities: Secondary | ICD-10-CM

## 2022-07-26 DIAGNOSIS — H2513 Age-related nuclear cataract, bilateral: Secondary | ICD-10-CM | POA: Diagnosis not present

## 2022-07-26 DIAGNOSIS — H524 Presbyopia: Secondary | ICD-10-CM | POA: Diagnosis not present

## 2022-07-26 DIAGNOSIS — H353131 Nonexudative age-related macular degeneration, bilateral, early dry stage: Secondary | ICD-10-CM | POA: Diagnosis not present

## 2022-08-06 ENCOUNTER — Telehealth: Payer: Medicare Other

## 2022-08-20 ENCOUNTER — Ambulatory Visit (INDEPENDENT_AMBULATORY_CARE_PROVIDER_SITE_OTHER): Payer: Medicare Other

## 2022-08-20 DIAGNOSIS — Z23 Encounter for immunization: Secondary | ICD-10-CM | POA: Diagnosis not present

## 2022-08-20 NOTE — Progress Notes (Signed)
Pt received flu vaccine w/o any complications.  

## 2022-08-25 ENCOUNTER — Other Ambulatory Visit: Payer: Self-pay

## 2022-08-25 ENCOUNTER — Emergency Department (HOSPITAL_COMMUNITY): Payer: Medicare Other

## 2022-08-25 ENCOUNTER — Inpatient Hospital Stay (HOSPITAL_COMMUNITY)
Admission: EM | Admit: 2022-08-25 | Discharge: 2022-08-27 | DRG: 378 | Disposition: A | Discharge status: Home / Self Care | Payer: Medicare Other | Attending: Internal Medicine | Admitting: Internal Medicine

## 2022-08-25 DIAGNOSIS — E785 Hyperlipidemia, unspecified: Secondary | ICD-10-CM | POA: Diagnosis not present

## 2022-08-25 DIAGNOSIS — R55 Syncope and collapse: Secondary | ICD-10-CM | POA: Diagnosis not present

## 2022-08-25 DIAGNOSIS — I9589 Other hypotension: Secondary | ICD-10-CM | POA: Diagnosis not present

## 2022-08-25 DIAGNOSIS — E861 Hypovolemia: Secondary | ICD-10-CM | POA: Diagnosis present

## 2022-08-25 DIAGNOSIS — K429 Umbilical hernia without obstruction or gangrene: Secondary | ICD-10-CM | POA: Diagnosis not present

## 2022-08-25 DIAGNOSIS — I255 Ischemic cardiomyopathy: Secondary | ICD-10-CM | POA: Diagnosis present

## 2022-08-25 DIAGNOSIS — I959 Hypotension, unspecified: Secondary | ICD-10-CM

## 2022-08-25 DIAGNOSIS — Z955 Presence of coronary angioplasty implant and graft: Secondary | ICD-10-CM

## 2022-08-25 DIAGNOSIS — Z79899 Other long term (current) drug therapy: Secondary | ICD-10-CM | POA: Diagnosis not present

## 2022-08-25 DIAGNOSIS — Z9861 Coronary angioplasty status: Secondary | ICD-10-CM | POA: Diagnosis not present

## 2022-08-25 DIAGNOSIS — Z7982 Long term (current) use of aspirin: Secondary | ICD-10-CM

## 2022-08-25 DIAGNOSIS — I10 Essential (primary) hypertension: Secondary | ICD-10-CM | POA: Diagnosis present

## 2022-08-25 DIAGNOSIS — K921 Melena: Secondary | ICD-10-CM

## 2022-08-25 DIAGNOSIS — Z85820 Personal history of malignant melanoma of skin: Secondary | ICD-10-CM

## 2022-08-25 DIAGNOSIS — D649 Anemia, unspecified: Secondary | ICD-10-CM | POA: Diagnosis not present

## 2022-08-25 DIAGNOSIS — K254 Chronic or unspecified gastric ulcer with hemorrhage: Secondary | ICD-10-CM | POA: Diagnosis not present

## 2022-08-25 DIAGNOSIS — R42 Dizziness and giddiness: Secondary | ICD-10-CM | POA: Diagnosis not present

## 2022-08-25 DIAGNOSIS — Z7902 Long term (current) use of antithrombotics/antiplatelets: Secondary | ICD-10-CM

## 2022-08-25 DIAGNOSIS — I252 Old myocardial infarction: Secondary | ICD-10-CM | POA: Diagnosis not present

## 2022-08-25 DIAGNOSIS — M1990 Unspecified osteoarthritis, unspecified site: Secondary | ICD-10-CM | POA: Diagnosis not present

## 2022-08-25 DIAGNOSIS — K5730 Diverticulosis of large intestine without perforation or abscess without bleeding: Secondary | ICD-10-CM | POA: Diagnosis not present

## 2022-08-25 DIAGNOSIS — D62 Acute posthemorrhagic anemia: Secondary | ICD-10-CM | POA: Diagnosis present

## 2022-08-25 DIAGNOSIS — Z8249 Family history of ischemic heart disease and other diseases of the circulatory system: Secondary | ICD-10-CM | POA: Diagnosis not present

## 2022-08-25 DIAGNOSIS — E739 Lactose intolerance, unspecified: Secondary | ICD-10-CM | POA: Diagnosis present

## 2022-08-25 DIAGNOSIS — Z86711 Personal history of pulmonary embolism: Secondary | ICD-10-CM | POA: Diagnosis not present

## 2022-08-25 DIAGNOSIS — K922 Gastrointestinal hemorrhage, unspecified: Secondary | ICD-10-CM | POA: Diagnosis present

## 2022-08-25 DIAGNOSIS — K8689 Other specified diseases of pancreas: Secondary | ICD-10-CM | POA: Diagnosis not present

## 2022-08-25 DIAGNOSIS — I2510 Atherosclerotic heart disease of native coronary artery without angina pectoris: Secondary | ICD-10-CM | POA: Diagnosis not present

## 2022-08-25 DIAGNOSIS — K4090 Unilateral inguinal hernia, without obstruction or gangrene, not specified as recurrent: Secondary | ICD-10-CM | POA: Diagnosis not present

## 2022-08-25 DIAGNOSIS — K259 Gastric ulcer, unspecified as acute or chronic, without hemorrhage or perforation: Secondary | ICD-10-CM | POA: Diagnosis not present

## 2022-08-25 DIAGNOSIS — R58 Hemorrhage, not elsewhere classified: Secondary | ICD-10-CM | POA: Diagnosis not present

## 2022-08-25 DIAGNOSIS — N400 Enlarged prostate without lower urinary tract symptoms: Secondary | ICD-10-CM | POA: Diagnosis present

## 2022-08-25 LAB — COMPREHENSIVE METABOLIC PANEL
ALT: 16 U/L (ref 0–44)
AST: 22 U/L (ref 15–41)
Albumin: 3.3 g/dL — ABNORMAL LOW (ref 3.5–5.0)
Alkaline Phosphatase: 67 U/L (ref 38–126)
Anion gap: 8 (ref 5–15)
BUN: 39 mg/dL — ABNORMAL HIGH (ref 8–23)
CO2: 22 mmol/L (ref 22–32)
Calcium: 8.5 mg/dL — ABNORMAL LOW (ref 8.9–10.3)
Chloride: 109 mmol/L (ref 98–111)
Creatinine, Ser: 0.96 mg/dL (ref 0.61–1.24)
GFR, Estimated: >60 mL/min (ref >60)
Glucose, Bld: 144 mg/dL — ABNORMAL HIGH (ref 70–99)
Potassium: 4.5 mmol/L (ref 3.5–5.1)
Sodium: 139 mmol/L (ref 135–145)
Total Bilirubin: 0.8 mg/dL (ref 0.3–1.2)
Total Protein: 6 g/dL — ABNORMAL LOW (ref 6.5–8.1)

## 2022-08-25 LAB — CBC WITH DIFFERENTIAL/PLATELET
Abs Immature Granulocytes: 0.04 10*3/uL (ref 0.00–0.07)
Basophils Absolute: 0.1 10*3/uL (ref 0.0–0.1)
Basophils Relative: 1 %
Eosinophils Absolute: 0 10*3/uL (ref 0.0–0.5)
Eosinophils Relative: 0 %
HCT: 36 % — ABNORMAL LOW (ref 39.0–52.0)
Hemoglobin: 11.6 g/dL — ABNORMAL LOW (ref 13.0–17.0)
Immature Granulocytes: 0 %
Lymphocytes Relative: 13 %
Lymphs Abs: 1.6 10*3/uL (ref 0.7–4.0)
MCH: 30.4 pg (ref 26.0–34.0)
MCHC: 32.2 g/dL (ref 30.0–36.0)
MCV: 94.5 fL (ref 80.0–100.0)
Monocytes Absolute: 0.7 10*3/uL (ref 0.1–1.0)
Monocytes Relative: 6 %
Neutro Abs: 9.4 10*3/uL — ABNORMAL HIGH (ref 1.7–7.7)
Neutrophils Relative %: 80 %
Platelets: 208 10*3/uL (ref 150–400)
RBC: 3.81 MIL/uL — ABNORMAL LOW (ref 4.22–5.81)
RDW: 13.7 % (ref 11.5–15.5)
WBC: 11.8 10*3/uL — ABNORMAL HIGH (ref 4.0–10.5)
nRBC: 0 % (ref 0.0–0.2)

## 2022-08-25 LAB — PROTIME-INR
INR: 1.1 (ref 0.8–1.2)
Prothrombin Time: 14.6 seconds (ref 11.4–15.2)

## 2022-08-25 LAB — LACTIC ACID, PLASMA
Lactic Acid, Venous: 2.9 mmol/L (ref 0.5–1.9)
Lactic Acid, Venous: 2 mmol/L (ref 0.5–1.9)
Lactic Acid, Venous: 1.3 mmol/L (ref 0.5–1.9)

## 2022-08-25 LAB — HEMOGLOBIN AND HEMATOCRIT, BLOOD
HCT: 29.2 % — ABNORMAL LOW (ref 39.0–52.0)
HCT: 19.3 % — ABNORMAL LOW (ref 39.0–52.0)
Hemoglobin: 9.9 g/dL — ABNORMAL LOW (ref 13.0–17.0)
Hemoglobin: 6.1 g/dL — CL (ref 13.0–17.0)

## 2022-08-25 LAB — PREPARE RBC (CROSSMATCH)

## 2022-08-25 LAB — POC OCCULT BLOOD, ED: Fecal Occult Bld: POSITIVE — AB

## 2022-08-25 LAB — APTT: aPTT: 24 seconds (ref 24–36)

## 2022-08-25 MED ORDER — PANTOPRAZOLE SODIUM 40 MG IV SOLR
40.0000 mg | Freq: Every day | INTRAVENOUS | Status: DC
  Administered 2022-08-25: 40 mg via INTRAVENOUS
  Filled 2022-08-25: qty 10

## 2022-08-25 MED ORDER — IOHEXOL 350 MG/ML SOLN
100.0000 mL | Freq: Once | INTRAVENOUS | Status: AC | PRN
  Administered 2022-08-25: 100 mL via INTRAVENOUS

## 2022-08-25 MED ORDER — LACTATED RINGERS IV BOLUS
1000.0000 mL | Freq: Once | INTRAVENOUS | Status: AC
  Administered 2022-08-25: 1000 mL via INTRAVENOUS

## 2022-08-25 MED ORDER — LORAZEPAM 2 MG/ML IJ SOLN
0.5000 mg | Freq: Once | INTRAMUSCULAR | Status: AC
  Administered 2022-08-25: 0.5 mg via INTRAVENOUS
  Filled 2022-08-25: qty 1

## 2022-08-25 MED ORDER — SODIUM CHLORIDE 0.9% IV SOLUTION: Freq: Once | INTRAVENOUS | Status: DC

## 2022-08-25 NOTE — Assessment & Plan Note (Signed)
History of PCI to LAD in 2018 with repeat in 2020 following thrombosis of the stent -Holding aspirin and Plavix for now due to acute GI bleed with symptomatic anemia.  Patient not able to recall if he took his morning dose today. -Denies any cardiac symptoms

## 2022-08-25 NOTE — ED Provider Notes (Signed)
[facility] EMERGENCY DEPARTMENT Provider Note   CSN: [id number] Arrival date & time: 08/25/22  1557     History  Chief Complaint  Patient presents with   Dizziness    John Mccarthy is a 71 y.o. male with ischemic cardiomyopathy, CAD status post PCI 2018, BPH, OA, recent positive colorectal cancer screening using Cologuard test presents with rectal bleeding.  BIB [facility] w/ melena that started 0400 AM. No h/o similar. Did have lightheadedness and a syncopal episode today. States he lowered himself to the ground and did not hit his head. Patient has no abdominal pain. Recent positive cologuard, patient has had colonoscopy 20 years ago. No f/c, CP, SOB, N/V, urinary symptoms. Takes plavix and aspirin but no other AC.   HPI     Home Medications Prior to Admission medications   Medication Sig Start Date End Date Taking? Authorizing Provider  aspirin EC 81 MG tablet Take 1 tablet (81 mg total) by mouth daily. 05/31/15   Janith Lima, MD  atorvastatin (LIPITOR) 80 MG tablet TAKE 1 TABLET BY MOUTH EVERY DAY AT 6 PM 01/02/22   Lorretta Harp, MD  Calcium Carbonate (CALCIUM 500 PO) Take 1 tablet by mouth once a week.    [provider]  carvedilol (COREG) 6.25 MG tablet TAKE 1 TABLET BY MOUTH 2 TIMES DAILY WITH A MEAL. 09/18/21   Lorretta Harp, MD  ciclopirox (LOPROX) 0.77 % cream Apply topically 2 (two) times daily. 06/20/22   Janith Lima, MD  clopidogrel (PLAVIX) 75 MG tablet TAKE 1 TABLET BY MOUTH EVERY DAY 05/06/22   Lorretta Harp, MD  Multiple Vitamins-Minerals (CENTRUM SILVER 50+MEN PO) Take 1 tablet by mouth daily.    [provider]  nitroGLYCERIN (NITROSTAT) 0.4 MG SL tablet Place 1 tablet (0.4 mg total) under the tongue every 5 (five) minutes as needed for chest pain. 07/08/19 10/06/19  Deberah Pelton, NP  sertraline (ZOLOFT) 50 MG tablet Take 1 tablet (50 mg total) by mouth daily. 06/20/22   Janith Lima, MD      Allergies     Crestor [rosuvastatin calcium] and Lactose intolerance (gi)    Review of Systems   Review of Systems Review of systems negative for abd pain.  A 10 point review of systems was performed and is negative unless otherwise reported in HPI.  Physical Exam Updated Vital Signs BP (!) 103/55   Pulse 72   Temp 98.3 F (36.8 C) (Oral)   Resp 19   Ht 6' (1.829 m)   Wt 91.6 kg   SpO2 96%   BMI 27.40 kg/m  Physical Exam General: Normal appearing male, lying in bed.  HEENT: PERRLA, Sclera anicteric, MMM, trachea midline. Cardiology: RRR, no murmurs/rubs/gallops. BL radial and DP pulses equal bilaterally.  Resp: Normal respiratory rate and effort. CTAB, no wheezes, rhonchi, crackles.  Abd: Soft, non-tender, non-distended. No rebound tenderness or guarding.  MSK: No peripheral edema or signs of trauma. Extremities without deformity or TTP. No cyanosis or clubbing. Skin: warm, dry. No rashes or lesions. Neuro: A&Ox4, CNs II-XII grossly intact. MAEs. Sensation grossly intact.  Psych: Normal mood and affect.   ED Results / Procedures / Treatments   Labs (all labs ordered are listed, but only abnormal results are displayed) Labs Reviewed  COMPREHENSIVE METABOLIC PANEL - Abnormal; Notable for the following components:      Result Value   Glucose, Bld 144 (*)    BUN 39 (*)  Calcium 8.5 (*)    Total Protein 6.0 (*)    Albumin 3.3 (*)    All other components within normal limits  CBC WITH DIFFERENTIAL/PLATELET - Abnormal; Notable for the following components:   WBC 11.8 (*)    RBC 3.81 (*)    Hemoglobin 11.6 (*)    HCT 36.0 (*)    Neutro Abs 9.4 (*)    All other components within normal limits  LACTIC ACID, PLASMA - Abnormal; Notable for the following components:   Lactic Acid, Venous 2.9 (*)    All other components within normal limits  HEMOGLOBIN AND HEMATOCRIT, BLOOD - Abnormal; Notable for the following components:   Hemoglobin 9.9 (*)    HCT 29.2 (*)    All other components  within normal limits  POC OCCULT BLOOD, ED - Abnormal; Notable for the following components:   Fecal Occult Bld POSITIVE (*)    All other components within normal limits  PROTIME-INR    Radiology CT ANGIO GI BLEED  Result Date: 08/25/2022 CLINICAL DATA:  gi bleed EXAM: CTA ABDOMEN AND PELVIS WITHOUT AND WITH CONTRAST TECHNIQUE: Multidetector CT imaging of the abdomen and pelvis was performed using the standard protocol during bolus administration of intravenous contrast. Multiplanar reconstructed images and MIPs were obtained and reviewed to evaluate the vascular anatomy. RADIATION DOSE REDUCTION: This exam was performed according to the departmental dose-optimization program which includes automated exposure control, adjustment of the mA and/or kV according to patient size and/or use of iterative reconstruction technique. CONTRAST:  143m OMNIPAQUE IOHEXOL 350 MG/ML SOLN COMPARISON:  None FINDINGS: VASCULAR No active extravasation of contrast identified. Aorta: Mild atherosclerotic plaque. Normal caliber aorta without aneurysm, dissection, vasculitis or significant stenosis. Celiac: Patent without evidence of aneurysm, dissection, vasculitis or significant stenosis. SMA: Patent without evidence of aneurysm, dissection, vasculitis or significant stenosis. Renals: Both renal arteries are patent without evidence of aneurysm, dissection, vasculitis, fibromuscular dysplasia or significant stenosis. IMA: Patent without evidence of aneurysm, dissection, vasculitis or significant stenosis. Inflow: Mild patent without evidence of aneurysm, dissection, vasculitis or significant stenosis. Proximal Outflow: Bilateral common femoral and visualized portions of the superficial and profunda femoral arteries are patent without evidence of aneurysm, dissection, vasculitis or significant stenosis. Veins: The main portal, splenic, superior mesenteric veins are patent. Inferior vena cava is patent. Review of the MIP images  confirms the above findings. NON-VASCULAR Lower chest: Elevated right hemidiaphragm with atelectasis of the right lower lobe. No acute abnormality. Coronary artery calcification. Hepatobiliary: No focal liver abnormality. No gallstones, gallbladder wall thickening, or pericholecystic fluid. No biliary dilatation. Pancreas: Diffusely atrophic. No focal lesion. Otherwise normal pancreatic contour. No surrounding inflammatory changes. No main pancreatic ductal dilatation. Spleen: Normal in size without focal abnormality.  Splenule noted. Adrenals/Urinary Tract: No adrenal nodule bilaterally. Bilateral kidneys enhance symmetrically. Couple fluid dense lesions within the left kidney likely represent simple renal cysts (6:42,60) likely represents a simple renal cyst. Simple renal cysts, in the absence of clinically indicated signs/symptoms, require no independent follow-up. Subcentimeter hypodensities are too small to characterize. No hydronephrosis. No hydroureter. The urinary bladder is unremarkable. Fluid density lesion Stomach/Bowel: Stomach is within normal limits. No evidence of bowel wall thickening or dilatation. Colonic diverticulosis. Second portion duodenal diverticula. Appendix appears normal. Lymphatic: No lymphadenopathy. Reproductive: Prostate is unremarkable. Other: No intraperitoneal free fluid. No intraperitoneal free gas. No organized fluid collection. Musculoskeletal: Tiny fat containing left inguinal hernia. Small fat containing umbilical hernia. No suspicious lytic or blastic osseous lesions. No acute displaced fracture. Multilevel  degenerative changes of the spine. Mild to moderate bilateral hip degenerative changes, right greater than left. IMPRESSION: VASCULAR 1. No CT evidence of active bleed. 2.  Aortic Atherosclerosis (ICD10-I70.0). NON-VASCULAR 1. Colonic diverticulosis with no acute diverticulitis. 2. Tiny fat containing left inguinal hernia. Small fat containing bili cul hernia.  Electronically Signed   By: Iven Finn M.D.   On: 08/25/2022 20:04    Procedures Procedures    Medications Ordered in ED Medications  pantoprazole (PROTONIX) injection 40 mg (has no administration in time range)  LORazepam (ATIVAN) injection 0.5 mg (has no administration in time range)  lactated ringers bolus 1,000 mL (0 mLs Intravenous Stopped 08/25/22 1952)  iohexol (OMNIPAQUE) 350 MG/ML injection 100 mL (100 mLs Intravenous Contrast Given 08/25/22 1934)    ED Course/ Medical Decision Making/ A&P                          Medical Decision Making Amount and/or Complexity of Data Reviewed Labs: ordered. Decision-making details documented in ED Course. Radiology: ordered.  Risk Prescription drug management. Decision regarding hospitalization.    DDx for GIB includes but is not limited to PUD, arteriovenous malformations (AVMs), Dieulafoy lesions, diverticulosis, or malignant lesions. No hematochezia.  Will evaluate for ABL anemia, electrolyte abnormalities, renal injury, arrhythmia based on report of syncope/lightheadedness. Pressures are soft but not hypotensive. Will await Hgb to see if we need to resuscitate with blood or fluids. On no AC besides plavix.   Hgb 11.6 but dropped from 15. BUN elevated to 39. Do not need blood transfusion at this time, but will resuscitate with 1L LR. Called to GI who requests CTA GIB protocol, which is pending at time of admission. Will start IV PPI and admit to medicine w/ GI following.  I have personally reviewed and interpreted all labs and imaging.   Clinical Course as of 08/25/22 2131  Sun Aug 25, 2022  1652 Fecal Occult Blood, POC(!): POSITIVE [HN]  1652 BP: 93/60 [HN]  1652 Pulse Rate: 83 [HN]  1726 Lactic Acid, Venous(!!): 2.9 [HN]  1727 Hemoglobin(!): 11.6 Down from 15 in July [HN]  1727 BUN(!): 39 [HN]  1728 Will give 1L LR for elevated lactate and BUN. Consulted to GI and hospitalist for admission. [HN]  1745 GI requesting CT  angio GIB protocol. [HN]    Clinical Course User Index [HN] Audley Hose, MD          Final Clinical Impression(s) / ED Diagnoses Final diagnoses:  Lightheadedness  Melena    Rx / DC Orders ED Discharge Orders     None        This note was created using dictation software, which may contain spelling or grammatical errors.    Audley Hose, MD 08/25/22 2142

## 2022-08-25 NOTE — Assessment & Plan Note (Signed)
Secondary to hypovolemia from acute GI bleed.  Blood pressure currently normalized after normal saline bolus. - Continue to follow and trend H&H.  Transfuse as needed as above.

## 2022-08-25 NOTE — ED Triage Notes (Signed)
BIB [facility]. Noticed rectal bleeding starting at 0400 today. Mostly liquid stool- dark blood. BP 90's. Possible hx ulcers? Aox4.  97/54 93% 86HR NSR

## 2022-08-25 NOTE — Assessment & Plan Note (Signed)
Continue statin when able to take p.o.

## 2022-08-25 NOTE — ED Notes (Signed)
DO Tu made aware of Hgb 6.1

## 2022-08-25 NOTE — Assessment & Plan Note (Addendum)
Presented with melena with suspected upper GI bleed. Hx of antral ulcer s/p epi in 2013. On aspirin and Plavix for CAD with stents but no other NSAIDS or ETOH use -CTA A/P negative for active extravasation -Hgb 11.5 down from 15 a few months ago. Continue to trend q2hr with Transfusion threshold of <10 given hx of CAD requiring repeated stenting and he was symptomatic  -start IV PPI -[facility] to consult in the morning.  Keep NPO.

## 2022-08-25 NOTE — Assessment & Plan Note (Signed)
Secondary to hypovolemia from acute GI bleed - Management as above

## 2022-08-25 NOTE — Assessment & Plan Note (Signed)
Secondary to acute GI bleed --Hgb 11.5 down from 15 a few months ago. Continue to trend q2hr with Transfusion threshold of <10 given hx of CAD requiring repeated stenting and he was symptomatic

## 2022-08-25 NOTE — H&P (Signed)
History and Physical    Patient: John Mccarthy DOB: 1951-01-17 DOA: 08/25/2022 DOS: the patient was seen and examined on 08/25/2022 PCP: Janith Lima, MD  Patient coming from: Home  Chief Complaint:  Chief Complaint  Patient presents with   Dizziness   HPI: John Mccarthy is a 71 y.o. male with medical history significant of CAD s/p PCI LAD with occlusion requiring stent x3, ischemic cardiomyopathy, BPH, hyperlipidemia who presents with rectal bleed.  Noticed dark diarrhea starting at 4am this morning. Had a total of 4 episodes. Started to feel dizziness with the first episode and on the second had brief loss of consciousness. Wife witnessed it by he decided to go back to bed until he had more dark diarrhea and became concern. Denies abdominal pain. No NSAIDS use. Cannot recall if he took morning dose of aspirin and Plavix.  Recently had recent positive cologuard test several months ago but did not want to get colonoscopy.  Hx of hematemesis in 2013 and found on endoscopy to have a large antral ulcer s/p epinephrine, small hiatal hernia and gastric erosion likely due to NG trauma. His last colonoscopy was in 2009 with moderate diverticulosis throughout the colon.  In the ED he was afebrile and mildly hypertensive with BP of 90 over 60s. This improved following NS bolus. Hemoglobin of 11.6 from a prior of 15 from just 2 months ago.  WBC of 11.8, lactate of 2.9.  Creatinine of 0.96 with elevated BUN of 39.  Positive FOBT.  CTA A/P is pending at time admission.  EDP did discuss with [facility] will see in consultation in the morning.  Review of Systems: As mentioned in the history of present illness. All other systems reviewed and are negative. Past Medical History:  Diagnosis Date   BPH (benign prostatic hyperplasia)    CAD S/P percutaneous coronary angioplasty 2018, 2020   anterior STEMI twice- LAD DES   GI bleeding 2013   Hyperlipidemia    Pulmonary  embolism ([place]) 06/15/2013   after achilles tendon repair- Xarelto 6 months   Skin cancer (melanoma) ([place])    Past Surgical History:  Procedure Laterality Date   ACHILLES TENDON SURGERY Right 05/18/2013   CARDIAC CATHETERIZATION     CORONARY STENT INTERVENTION N/A 02/11/2017   Procedure: Coronary Stent Intervention;  Surgeon: Lorretta Harp, MD;  Location: [place] [facility];  Service: Cardiovascular;  Laterality: N/A;   CORONARY/GRAFT ACUTE MI REVASCULARIZATION N/A 06/28/2019   Procedure: Coronary/Graft Acute MI Revascularization;  Surgeon: Lorretta Harp, MD;  Location: [place] [facility];  Service: Cardiovascular;  Laterality: N/A;   ESOPHAGOGASTRODUODENOSCOPY  09/24/2012   Procedure: ESOPHAGOGASTRODUODENOSCOPY (EGD);  Surgeon: Lear Ng, MD;  Location: [facility] ENDOSCOPY;  Service: Endoscopy;  Laterality: N/A;   HAND SURGERY     LEFT HEART CATH AND CORONARY ANGIOGRAPHY N/A 02/11/2017   Procedure: Left Heart Cath and Coronary Angiography;  Surgeon: Lorretta Harp, MD;  Location: [place] [facility];  Service: Cardiovascular;  Laterality: N/A;   LEFT HEART CATH AND CORONARY ANGIOGRAPHY N/A 06/28/2019   Procedure: LEFT HEART CATH AND CORONARY ANGIOGRAPHY;  Surgeon: Lorretta Harp, MD;  Location: [place] [facility];  Service: Cardiovascular;  Laterality: N/A;   NOSE SURGERY  08/11/2019   removal of melenoma    Social History:  reports that he has never smoked. He has never used smokeless tobacco. He reports current alcohol use of about 5.0 standard drinks of alcohol per week. He reports  that he does not use drugs.  Allergies  Allergen Reactions   Crestor [Rosuvastatin Calcium] Other (See Comments)    Muscle aches   Lactose Intolerance (Gi) Diarrhea    Family History  Problem Relation Age of Onset   Heart failure Father    Coronary artery disease Sister 59   Cancer Neg Hx    COPD Neg Hx    Stroke Neg Hx    Kidney disease Neg Hx    Hypertension Neg Hx    Hyperlipidemia  Neg Hx    Heart disease Neg Hx    Hearing loss Neg Hx    Early death Neg Hx    Drug abuse Neg Hx    Diabetes Neg Hx    Depression Neg Hx     Prior to Admission medications   Medication Sig Start Date End Date Taking? Authorizing Provider  aspirin EC 81 MG tablet Take 1 tablet (81 mg total) by mouth daily. 05/31/15   Janith Lima, MD  atorvastatin (LIPITOR) 80 MG tablet TAKE 1 TABLET BY MOUTH EVERY DAY AT 6 PM 01/02/22   Lorretta Harp, MD  Calcium Carbonate (CALCIUM 500 PO) Take 1 tablet by mouth once a week.    [provider]  carvedilol (COREG) 6.25 MG tablet TAKE 1 TABLET BY MOUTH 2 TIMES DAILY WITH A MEAL. 09/18/21   Lorretta Harp, MD  ciclopirox (LOPROX) 0.77 % cream Apply topically 2 (two) times daily. 06/20/22   Janith Lima, MD  clopidogrel (PLAVIX) 75 MG tablet TAKE 1 TABLET BY MOUTH EVERY DAY 05/06/22   Lorretta Harp, MD  Multiple Vitamins-Minerals (CENTRUM SILVER 50+MEN PO) Take 1 tablet by mouth daily.    [provider]  nitroGLYCERIN (NITROSTAT) 0.4 MG SL tablet Place 1 tablet (0.4 mg total) under the tongue every 5 (five) minutes as needed for chest pain. 07/08/19 10/06/19  Deberah Pelton, NP  sertraline (ZOLOFT) 50 MG tablet Take 1 tablet (50 mg total) by mouth daily. 06/20/22   Janith Lima, MD    Physical Exam: Vitals:   08/25/22 1815 08/25/22 1830 08/25/22 1845 08/25/22 1900  BP: 102/65 116/61 (!) 108/57 (!) 103/55  Pulse: 75 75 72 72  Resp: '18 17 19 19  '$ Temp:      TempSrc:      SpO2: 98% 98% 98% 96%  Weight:      Height:       Constitutional: NAD, calm, comfortable, well-appearing elderly gentleman laying flat in bed Eyes:  lids and conjunctivae normal ENMT: Mucous membranes are moist.  Neck: normal, supple, Respiratory: clear to auscultation bilaterally, no wheezing, no crackles. Normal respiratory effort. No accessory muscle use.  Cardiovascular: Regular rate and rhythm, no murmurs / rubs / gallops. No extremity edema.    Abdomen: Soft, nondistended no tenderness,  Bowel sounds positive.  Rectal: Exam deferred Musculoskeletal: no clubbing / cyanosis. No joint deformity upper and lower extremities.  Normal muscle tone.  Skin: no rashes, lesions, ulcers. No induration Neurologic: CN 2-12 grossly intact.  Psychiatric: Normal judgment and insight. Alert and oriented x 3. Normal mood. Data Reviewed:  See HPI  Assessment and Plan: * Acute GI bleeding Presented with melena with suspected upper GI bleed. Hx of antral ulcer s/p epi in 2013. On aspirin and Plavix for CAD with stents but no other NSAIDS or ETOH use -CTA A/P negative for active extravasation -Hgb 11.5 down from 15 a few months ago. Continue to trend q2hr with Transfusion threshold  of <10 given hx of CAD requiring repeated stenting and he was symptomatic  -start IV PPI -[facility] to consult in the morning.  Keep NPO.  Hypotension Secondary to hypovolemia from acute GI bleed.  Blood pressure currently normalized after normal saline bolus. - Continue to follow and trend H&H.  Transfuse as needed as above.  Syncope Secondary to hypovolemia from acute GI bleed - Management as above  CAD S/P percutaneous coronary angioplasty History of PCI to LAD in 2018 with repeat in 2020 following thrombosis of the stent -Holding aspirin and Plavix for now due to acute GI bleed with symptomatic anemia.  Patient not able to recall if he took his morning dose today. -Denies any cardiac symptoms  Dyslipidemia, goal LDL below 70 Continue statin when able to take p.o.  Anemia Secondary to acute GI bleed --Hgb 11.5 down from 15 a few months ago. Continue to trend q2hr with Transfusion threshold of <10 given hx of CAD requiring repeated stenting and he was symptomatic       Advance Care Planning:   Code Status: Full Code Full   Consults: [facility]  Family Communication: None at bedside  Severity of Illness: The appropriate patient status for this patient  is OBSERVATION. Observation status is judged to be reasonable and necessary in order to provide the required intensity of service to ensure the patient's safety. The patient's presenting symptoms, physical exam findings, and initial radiographic and laboratory data in the context of their medical condition is felt to place them at decreased risk for further clinical deterioration. Furthermore, it is anticipated that the patient will be medically stable for discharge from the hospital within 2 midnights of admission.   Author: Orene Desanctis, DO 08/25/2022 8:42 PM  For on call review [email or web address].

## 2022-08-26 ENCOUNTER — Observation Stay (HOSPITAL_BASED_OUTPATIENT_CLINIC_OR_DEPARTMENT_OTHER): Payer: Medicare Other | Admitting: Anesthesiology

## 2022-08-26 ENCOUNTER — Encounter (HOSPITAL_COMMUNITY)
Admission: EM | Disposition: A | Discharge status: Home / Self Care | Payer: Self-pay | Source: Home / Self Care | Attending: Internal Medicine

## 2022-08-26 ENCOUNTER — Observation Stay (HOSPITAL_COMMUNITY): Payer: Medicare Other | Admitting: Anesthesiology

## 2022-08-26 DIAGNOSIS — Z9861 Coronary angioplasty status: Secondary | ICD-10-CM | POA: Diagnosis not present

## 2022-08-26 DIAGNOSIS — Z8249 Family history of ischemic heart disease and other diseases of the circulatory system: Secondary | ICD-10-CM | POA: Diagnosis not present

## 2022-08-26 DIAGNOSIS — K922 Gastrointestinal hemorrhage, unspecified: Secondary | ICD-10-CM | POA: Diagnosis not present

## 2022-08-26 DIAGNOSIS — I10 Essential (primary) hypertension: Secondary | ICD-10-CM

## 2022-08-26 DIAGNOSIS — D62 Acute posthemorrhagic anemia: Secondary | ICD-10-CM

## 2022-08-26 DIAGNOSIS — I9589 Other hypotension: Secondary | ICD-10-CM | POA: Diagnosis present

## 2022-08-26 DIAGNOSIS — I2510 Atherosclerotic heart disease of native coronary artery without angina pectoris: Secondary | ICD-10-CM | POA: Diagnosis not present

## 2022-08-26 DIAGNOSIS — I255 Ischemic cardiomyopathy: Secondary | ICD-10-CM | POA: Diagnosis present

## 2022-08-26 DIAGNOSIS — K259 Gastric ulcer, unspecified as acute or chronic, without hemorrhage or perforation: Secondary | ICD-10-CM | POA: Diagnosis not present

## 2022-08-26 DIAGNOSIS — D649 Anemia, unspecified: Secondary | ICD-10-CM | POA: Diagnosis not present

## 2022-08-26 DIAGNOSIS — N400 Enlarged prostate without lower urinary tract symptoms: Secondary | ICD-10-CM | POA: Diagnosis present

## 2022-08-26 DIAGNOSIS — M1990 Unspecified osteoarthritis, unspecified site: Secondary | ICD-10-CM

## 2022-08-26 DIAGNOSIS — Z7982 Long term (current) use of aspirin: Secondary | ICD-10-CM | POA: Diagnosis not present

## 2022-08-26 DIAGNOSIS — I252 Old myocardial infarction: Secondary | ICD-10-CM | POA: Diagnosis not present

## 2022-08-26 DIAGNOSIS — K254 Chronic or unspecified gastric ulcer with hemorrhage: Secondary | ICD-10-CM | POA: Diagnosis present

## 2022-08-26 DIAGNOSIS — K921 Melena: Secondary | ICD-10-CM

## 2022-08-26 DIAGNOSIS — Z7902 Long term (current) use of antithrombotics/antiplatelets: Secondary | ICD-10-CM | POA: Diagnosis not present

## 2022-08-26 DIAGNOSIS — Z86711 Personal history of pulmonary embolism: Secondary | ICD-10-CM | POA: Diagnosis not present

## 2022-08-26 DIAGNOSIS — Z955 Presence of coronary angioplasty implant and graft: Secondary | ICD-10-CM | POA: Diagnosis not present

## 2022-08-26 DIAGNOSIS — Z85820 Personal history of malignant melanoma of skin: Secondary | ICD-10-CM | POA: Diagnosis not present

## 2022-08-26 DIAGNOSIS — Z79899 Other long term (current) drug therapy: Secondary | ICD-10-CM | POA: Diagnosis not present

## 2022-08-26 DIAGNOSIS — E739 Lactose intolerance, unspecified: Secondary | ICD-10-CM | POA: Diagnosis present

## 2022-08-26 DIAGNOSIS — E785 Hyperlipidemia, unspecified: Secondary | ICD-10-CM | POA: Diagnosis present

## 2022-08-26 DIAGNOSIS — E861 Hypovolemia: Secondary | ICD-10-CM | POA: Diagnosis present

## 2022-08-26 HISTORY — PX: BIOPSY: SHX5522

## 2022-08-26 HISTORY — PX: ESOPHAGOGASTRODUODENOSCOPY (EGD) WITH PROPOFOL: SHX5813

## 2022-08-26 LAB — BASIC METABOLIC PANEL
Anion gap: 10 (ref 5–15)
BUN: 25 mg/dL — ABNORMAL HIGH (ref 8–23)
CO2: 25 mmol/L (ref 22–32)
Calcium: 8.8 mg/dL — ABNORMAL LOW (ref 8.9–10.3)
Chloride: 107 mmol/L (ref 98–111)
Creatinine, Ser: 1.07 mg/dL (ref 0.61–1.24)
GFR, Estimated: >60 mL/min (ref >60)
Glucose, Bld: 101 mg/dL — ABNORMAL HIGH (ref 70–99)
Potassium: 4.2 mmol/L (ref 3.5–5.1)
Sodium: 142 mmol/L (ref 135–145)

## 2022-08-26 LAB — CBC
HCT: 35.1 % — ABNORMAL LOW (ref 39.0–52.0)
Hemoglobin: 12.2 g/dL — ABNORMAL LOW (ref 13.0–17.0)
MCH: 30.7 pg (ref 26.0–34.0)
MCHC: 34.8 g/dL (ref 30.0–36.0)
MCV: 88.2 fL (ref 80.0–100.0)
Platelets: 186 10*3/uL (ref 150–400)
RBC: 3.98 MIL/uL — ABNORMAL LOW (ref 4.22–5.81)
RDW: 14.7 % (ref 11.5–15.5)
WBC: 11.3 10*3/uL — ABNORMAL HIGH (ref 4.0–10.5)
nRBC: 0 % (ref 0.0–0.2)

## 2022-08-26 LAB — HEMOGLOBIN AND HEMATOCRIT, BLOOD
HCT: 33.5 % — ABNORMAL LOW (ref 39.0–52.0)
Hemoglobin: 11.6 g/dL — ABNORMAL LOW (ref 13.0–17.0)

## 2022-08-26 SURGERY — ESOPHAGOGASTRODUODENOSCOPY (EGD) WITH PROPOFOL
Anesthesia: Monitor Anesthesia Care

## 2022-08-26 MED ORDER — DEXTROSE-NACL 5-0.45 % IV SOLN: INTRAVENOUS | Status: DC

## 2022-08-26 MED ORDER — LIDOCAINE 2% (20 MG/ML) 5 ML SYRINGE
INTRAMUSCULAR | Status: DC | PRN
  Administered 2022-08-26: 40 mg via INTRAVENOUS

## 2022-08-26 MED ORDER — PROPOFOL 500 MG/50ML IV EMUL
INTRAVENOUS | Status: DC | PRN
  Administered 2022-08-26: 70 ug/kg/min via INTRAVENOUS

## 2022-08-26 MED ORDER — LACTATED RINGERS IV SOLN
INTRAVENOUS | Status: AC | PRN
  Administered 2022-08-26: 1000 mL via INTRAVENOUS

## 2022-08-26 MED ORDER — PANTOPRAZOLE 80MG IVPB - SIMPLE MED
80.0000 mg | Freq: Once | INTRAVENOUS | Status: AC
  Administered 2022-08-26: 80 mg via INTRAVENOUS
  Filled 2022-08-26: qty 80

## 2022-08-26 MED ORDER — PHENYLEPHRINE 80 MCG/ML (10ML) SYRINGE FOR IV PUSH (FOR BLOOD PRESSURE SUPPORT)
PREFILLED_SYRINGE | INTRAVENOUS | Status: DC | PRN
  Administered 2022-08-26 (×2): 160 ug via INTRAVENOUS

## 2022-08-26 MED ORDER — PANTOPRAZOLE 80MG IVPB - SIMPLE MED
80.0000 mg | Freq: Every day | INTRAVENOUS | Status: DC
  Filled 2022-08-26: qty 100

## 2022-08-26 MED ORDER — PANTOPRAZOLE SODIUM 40 MG PO TBEC
40.0000 mg | DELAYED_RELEASE_TABLET | Freq: Two times a day (BID) | ORAL | Status: DC
  Administered 2022-08-26 – 2022-08-27 (×2): 40 mg via ORAL
  Filled 2022-08-26 (×2): qty 1

## 2022-08-26 MED ORDER — PANTOPRAZOLE SODIUM 40 MG IV SOLR: 40.0000 mg | Freq: Two times a day (BID) | INTRAVENOUS | Status: DC

## 2022-08-26 MED ORDER — ONDANSETRON HCL 4 MG/2ML IJ SOLN: 4.0000 mg | Freq: Four times a day (QID) | INTRAMUSCULAR | Status: DC | PRN

## 2022-08-26 MED ORDER — PROPOFOL 10 MG/ML IV BOLUS
INTRAVENOUS | Status: DC | PRN
  Administered 2022-08-26: 50 mg via INTRAVENOUS

## 2022-08-26 MED ORDER — PANTOPRAZOLE INFUSION (NEW) - SIMPLE MED
8.0000 mg/h | INTRAVENOUS | Status: DC
  Administered 2022-08-26: 8 mg/h via INTRAVENOUS
  Filled 2022-08-26: qty 80

## 2022-08-26 SURGICAL SUPPLY — 15 items

## 2022-08-26 NOTE — H&P (View-Only) (Signed)
Consultation  Referring Provider: [facility]/ Elgerawy Primary Care Physician:  Janith Lima, MD Primary Gastroenterologist:  unassigned  Reason for Consultation:  acute GI bleed  HPI: John Mccarthy is a 71 y.o. male, with history of coronary artery disease, status post PCI 2018, maintained on aspirin and Plavix, also with history of ischemic cardiomyopathy, last echo 2021 with EF 50 to 55%, no aortic stenosis, prior history of PE, BPH, osteoarthritis. Patient had acute onset early yesterday morning with melena at home.  He did have a brief syncopal episode in the bathroom, but did not actually fall or hit his head.  Relates having about 5 episodes of melena over the next few hours prior to calling 911, no bright red blood noted.  He denies any abdominal pain cramping, nausea or vomiting. Says he had not been feeling well over the past 3 to 4 weeks after he pulled his groin on a ladder and was also having symptoms from sciatica and had not been sleeping well.  He says he did not take any NSAIDs at all just his usual regimen. No nausea or abdominal discomfort or any GI symptoms prior to the abrupt onset of melena yesterday morning. Since arrival to the emergency room he was noted to be mildly hypotensive initially has been stable overnight, has not had any further episodes of melena.  He denies any chest pain or shortness of breath, does feel somewhat lightheaded if he tries to get up. Prior EGD in 2013 Dr. Cari Caraway for bleeding with finding of a large antral ulcer, with flat red spot that was treated with epi. He had colonoscopy in 2009 per Dr. Delfin Edis with finding of moderate pandiverticulosis, no polyps. He had done a recent Cologuard per his PCP which was positive and was in the process of being referred to GI to discuss colonoscopy which she had been hoping to avoid.  On admission WBC 11.6/hemoglobin 11.6/hematocrit 36 Lactate 2.9>1.3 Follow-up hemoglobin down to 9.9 and then late  last evening hemoglobin down to 6.1.  He was transfused 2 units of packed RBCs and hemoglobin up to 12.2/hematocrit 35.1 this AM. Initial BUN 39/creatinine 0.9   He has been started on PPI infusion.     Past Medical History:  Diagnosis Date   BPH (benign prostatic hyperplasia)    CAD S/P percutaneous coronary angioplasty 2018, 2020   anterior STEMI twice- LAD DES   GI bleeding 2013   Hyperlipidemia    Pulmonary embolism ([place]) 06/15/2013   after achilles tendon repair- Xarelto 6 months   Skin cancer (melanoma) ([place])     Past Surgical History:  Procedure Laterality Date   ACHILLES TENDON SURGERY Right 05/18/2013   CARDIAC CATHETERIZATION     CORONARY STENT INTERVENTION N/A 02/11/2017   Procedure: Coronary Stent Intervention;  Surgeon: Lorretta Harp, MD;  Location: [facility];  Service: Cardiovascular;  Laterality: N/A;   CORONARY/GRAFT ACUTE MI REVASCULARIZATION N/A 06/28/2019   Procedure: Coronary/Graft Acute MI Revascularization;  Surgeon: Lorretta Harp, MD;  Location: [facility];  Service: Cardiovascular;  Laterality: N/A;   ESOPHAGOGASTRODUODENOSCOPY  09/24/2012   Procedure: ESOPHAGOGASTRODUODENOSCOPY (EGD);  Surgeon: Lear Ng, MD;  Location[facility] END[facility];  Service: Endoscopy;  Laterality: N/A;   HAND SURGERY     LEFT HEART CATH AND CORONARY ANGIOGRAPHY N/A 02/11/2017   Procedure: Left Heart Cath and Coronary Angiography;  Surgeon: Lorretta Harp, MD;  Location: [facility];  Service: Cardiovascular;  Laterality: N/A;  LEFT HEART CATH AND CORONARY ANGIOGRAPHY N/A 06/28/2019   Procedure: LEFT HEART CATH AND CORONARY ANGIOGRAPHY;  Surgeon: Lorretta Harp, MD;  Location: [place] [facility];  Service: Cardiovascular;  Laterality: N/A;   NOSE SURGERY  08/11/2019   removal of melenoma     Prior to Admission medications   Medication Sig Start Date End Date Taking? Authorizing Provider  aspirin EC 81 MG tablet Take 1 tablet (81 mg total)  by mouth daily. 05/31/15  Yes Janith Lima, MD  atorvastatin (LIPITOR) 80 MG tablet TAKE 1 TABLET BY MOUTH EVERY DAY AT 6 PM Patient taking differently: Take 80 mg by mouth daily. 01/02/22  Yes Lorretta Harp, MD  carvedilol (COREG) 6.25 MG tablet TAKE 1 TABLET BY MOUTH 2 TIMES DAILY WITH A MEAL. Patient taking differently: Take 6.25 mg by mouth 2 (two) times daily with a meal. 09/18/21  Yes Lorretta Harp, MD  clopidogrel (PLAVIX) 75 MG tablet TAKE 1 TABLET BY MOUTH EVERY DAY Patient taking differently: Take 75 mg by mouth daily. 05/06/22  Yes Lorretta Harp, MD  Multiple Vitamins-Minerals (CENTRUM SILVER 50+MEN PO) Take 1 tablet by mouth daily.   Yes [provider]  nitroGLYCERIN (NITROSTAT) 0.4 MG SL tablet Place 1 tablet (0.4 mg total) under the tongue every 5 (five) minutes as needed for chest pain. 07/08/19 10/12/22 Yes Cleaver, Jossie Ng, NP  sertraline (ZOLOFT) 50 MG tablet Take 1 tablet (50 mg total) by mouth daily. 06/20/22  Yes Janith Lima, MD  ciclopirox (LOPROX) 0.77 % cream Apply topically 2 (two) times daily. Patient not taking: Reported on 08/26/2022 06/20/22   Janith Lima, MD    Current Facility-Administered Medications  Medication Dose Route Frequency Provider Last Rate Last Admin   0.9 %  sodium chloride infusion (Manually program via Guardrails IV Fluids)   Intravenous Once Tu, Ching T, DO   Held at 08/25/22 2337   pantoprazole (PROTONIX) 80 mg /NS 100 mL IVPB  80 mg Intravenous Once Elgergawy, Silver Huguenin, MD       [START ON 08/29/2022] pantoprazole (PROTONIX) injection 40 mg  40 mg Intravenous Q12H Elgergawy, Silver Huguenin, MD       pantoprozole (PROTONIX) 80 mg /NS 100 mL infusion  8 mg/hr Intravenous Continuous Elgergawy, Silver Huguenin, MD       Current Outpatient Medications  Medication Sig Dispense Refill   aspirin EC 81 MG tablet Take 1 tablet (81 mg total) by mouth daily. 90 tablet 3   atorvastatin (LIPITOR) 80 MG tablet TAKE 1 TABLET BY MOUTH EVERY DAY AT 6 PM  (Patient taking differently: Take 80 mg by mouth daily.) 90 tablet 3   carvedilol (COREG) 6.25 MG tablet TAKE 1 TABLET BY MOUTH 2 TIMES DAILY WITH A MEAL. (Patient taking differently: Take 6.25 mg by mouth 2 (two) times daily with a meal.) 180 tablet 3   clopidogrel (PLAVIX) 75 MG tablet TAKE 1 TABLET BY MOUTH EVERY DAY (Patient taking differently: Take 75 mg by mouth daily.) 90 tablet 1   Multiple Vitamins-Minerals (CENTRUM SILVER 50+MEN PO) Take 1 tablet by mouth daily.     nitroGLYCERIN (NITROSTAT) 0.4 MG SL tablet Place 1 tablet (0.4 mg total) under the tongue every 5 (five) minutes as needed for chest pain. 25 tablet 3   sertraline (ZOLOFT) 50 MG tablet Take 1 tablet (50 mg total) by mouth daily. 90 tablet 1   ciclopirox (LOPROX) 0.77 % cream Apply topically 2 (two) times daily. (Patient not taking:  Reported on 08/26/2022) 90 g 1    Allergies as of 08/25/2022 - Review Complete 08/25/2022  Allergen Reaction Noted   Crestor [rosuvastatin calcium] Other (See Comments) 10/15/2016   Lactose intolerance (gi) Diarrhea 09/25/2012    Family History  Problem Relation Age of Onset   Heart failure Father    Coronary artery disease Sister 21   Cancer Neg Hx    COPD Neg Hx    Stroke Neg Hx    Kidney disease Neg Hx    Hypertension Neg Hx    Hyperlipidemia Neg Hx    Heart disease Neg Hx    Hearing loss Neg Hx    Early death Neg Hx    Drug abuse Neg Hx    Diabetes Neg Hx    Depression Neg Hx     Social History   Socioeconomic History   Marital status: Married    Spouse name: Olin Hauser   Number of children: Not on file   Years of education: Not on file   Highest education level: Not on file  Occupational History   Occupation: retired  Tobacco Use   Smoking status: Never   Smokeless tobacco: Never  Vaping Use   Vaping Use: Never used  Substance and Sexual Activity   Alcohol use: Yes    Alcohol/week: 5.0 standard drinks of alcohol    Types: 5 Shots of liquor per week    Comment:  occassional   Drug use: No   Sexual activity: Yes    Birth control/protection: None  Other Topics Concern   Not on file  Social History Narrative   Lives in 2 story home with wife   Children live nearby   Social Determinants of Health   Financial Resource Strain: Low Risk  (02/22/2022)   Overall Financial Resource Strain (CARDIA)    Difficulty of Paying Living Expenses: Not hard at all  Food Insecurity: No Food Insecurity (02/22/2022)   Hunger Vital Sign    Worried About Running Out of Food in the Last Year: Never true    Ran Out of Food in the Last Year: Never true  Transportation Needs: No Transportation Needs (02/22/2022)   PRAPARE - Hydrologist (Medical): No    Lack of Transportation (Non-Medical): No  Physical Activity: Insufficiently Active (02/22/2022)   Exercise Vital Sign    Days of Exercise per Week: 3 days    Minutes of Exercise per Session: 30 min  Stress: No Stress Concern Present (02/22/2022)   [place]    Feeling of Stress : Not at all  Social Connections: [place] (02/22/2022)   Social Connection and Isolation Panel [NHANES]    Frequency of Communication with Friends and Family: More than three times a week    Frequency of Social Gatherings with Friends and Family: More than three times a week    Attends Religious Services: More than 4 times per year    Active Member of [employer] or Organizations: Yes    Attends Music therapist: More than 4 times per year    Marital Status: Married  Human resources officer Violence: Not At Risk (02/22/2022)   Humiliation, Afraid, Rape, and Kick questionnaire    Fear of Current or Ex-Partner: No    Emotionally Abused: No    Physically Abused: No    Sexually Abused: No    Review of Systems: Pertinent positive and negative review of systems were noted in the above  HPI section.  All other review of systems was otherwise  negative.   Physical Exam: Vital signs in last 24 hours: Temp:  [98.2 F (36.8 C)-99 F (37.2 C)] 98.8 F (37.1 C) (09/25 0745) Pulse Rate:  [63-95] 66 (09/25 0730) Resp:  [14-25] 15 (09/25 0730) BP: (93-116)/(52-69) 104/66 (09/25 0730) SpO2:  [94 %-99 %] 96 % (09/25 0730) Weight:  [91.6 kg] 91.6 kg (09/24 1605)   General:   Alert,  Well-developed, well-nourished, white male pleasant and cooperative in NAD Head:  Normocephalic and atraumatic. Eyes:  Sclera clear, no icterus.   Conjunctiva pink. Ears:  Normal auditory acuity. Nose:  No deformity, discharge,  or lesions. Mouth:  No deformity or lesions.   Neck:  Supple; no masses or thyromegaly. Lungs:  Clear throughout to auscultation.   No wheezes, crackles, or rhonchi. Heart:  Regular rate and rhythm; no murmurs, clicks, rubs,  or gallops. Abdomen:  Soft,nontender, BS active,nonpalp mass or hsm.   Rectal not done Msk:  Symmetrical without gross deformities. . Pulses:  Normal pulses noted. Extremities:  Without clubbing or edema. Neurologic:  Alert and  oriented x4;  grossly normal neurologically. Skin:  Intact without significant lesions or rashes.. Psych:  Alert and cooperative. Normal mood and affect.  Intake/Output from previous day: 09/24 0701 - 09/25 0700 In: 1667.5 [Blood:667.5; IV Piggyback:1000] Out: -  Intake/Output this shift: No intake/output data recorded.  Lab Results: Recent Labs    08/25/22 1628 08/25/22 2024 08/25/22 2210 08/26/22 0500  WBC 11.8*  --   --  11.3*  HGB 11.6* 9.9* 6.1* 12.2*  HCT 36.0* 29.2* 19.3* 35.1*  PLT 208  --   --  186   BMET Recent Labs    08/25/22 1628 08/26/22 0500  NA 139 142  K 4.5 4.2  CL 109 107  CO2 22 25  GLUCOSE 144* 101*  BUN 39* 25*  CREATININE 0.96 1.07  CALCIUM 8.5* 8.8*   LFT Recent Labs    08/25/22 1628  PROT 6.0*  ALBUMIN 3.3*  AST 22  ALT 16  ALKPHOS 67  BILITOT 0.8   PT/INR Recent Labs    08/25/22 1628  LABPROT 14.6  INR 1.1    Hepatitis Panel No results for input(s): "HEPBSAG", "HCVAB", "HEPAIGM", "HEPBIGM" in the last 72 hours.    IMPRESSION:  #104 71 year old white male, on chronic Plavix and aspirin presenting with acute onset of painless melena yesterday morning.  He had about 5 episodes prior to coming to the emergency room and did have a brief syncopal episode at home. No associated abdominal pain or cramping no nausea or vomiting. No further active bleeding since arrival in the emergency room  Initial hemoglobin 11.6> 9.9> 6.1> transfused 2 units> 12.2  Suspect hemoglobin of 6.1 was spurious. Patient has prior history of GI bleed secondary to antral ulcer 2013. Current bleeding consistent with upper GI bleed, rule out recurrent peptic ulcer disease.  #2 anemia secondary to acute GI blood loss stable post transfusions #3 history of pandiverticulosis #4recent positive Cologuard #5 ischemic cardiomyopathy most recent EF 50 to 55% #6 coronary artery disease status post stent 2018 #7 chronic antiplatelet therapy on Plavix and aspirin #8 hyperlipidemia    PLAN: Keep n.p.o. PPI infusion Start IV fluids at 75 cc/h Continue serial hemoglobins every 6 hours, transfuse as indicated for hemoglobin 8 or less due to symptomatic state Hold aspirin and Plavix-last dose Saturday evening Patient will be scheduled for EGD with Dr. Loletha Carrow today.  Procedure was  discussed in detail with the patient including indications risk and benefits and he is agreeable to proceed. Further recommendations pending results of EGD He will also eventually need colonoscopy, timing inpatient versus outpatient based on findings of EGD. I will follow with you   Amy EsterwoodPA-C  08/26/2022, 8:30 AM   I have taken an interval history, thoroughly reviewed the chart and examined the patient. I agree with the Advanced Practitioner's note, impression and recommendations, and have recorded additional findings, impressions and  recommendations below. I performed a substantive portion of this encounter (>50% time spent), including a complete performance of the medical decision making.  My additional thoughts are as follows:  71 year old man on aspirin and Plavix with melena and marked acute blood loss anemia. No chest pain dyspnea or abdominal pain or other chronic upper digestive symptoms. Suspect gastric or duodenal ulcer.  Transfusion required overnight, currently hemodynamically stable.  He has good IV access, is on acid suppression, is alert and conversational and agreeable to upper endoscopy.  The benefits and risks of the planned procedure were described in detail with the patient or (when appropriate) their health care proxy.  Risks were outlined as including, but not limited to, bleeding, infection, perforation, adverse medication reaction leading to cardiac or pulmonary decompensation, pancreatitis (if ERCP).  The limitation of incomplete mucosal visualization was also discussed.  No guarantees or warranties were given. Patient at increased risk for cardiopulmonary complications of procedure due to medical comorbidities.    [name] Office:[phone]

## 2022-08-26 NOTE — ED Notes (Addendum)
Per DO Tu repeat hemoglobin and hematocrit 15 minutes following 2nd unit of blood. Goal hgb 10. Notify MD Opyd of results.

## 2022-08-26 NOTE — Op Note (Addendum)
[facility] Patient Name: John Mccarthy Procedure Date : 08/26/2022 MRN: [id number] Attending MD: Estill Cotta. Loletha Carrow , MD Date of Birth: [birth year] CSN: [id number] Age: 71 Admit Type: Inpatient Procedure:                Upper GI endoscopy Indications:              Acute post hemorrhagic anemia, Melena Providers:                Mallie Mussel L. Loletha Carrow, MD, Jaci Carrel, RN, Darliss Cheney, Technician Referring MD:              Medicines:                Monitored Anesthesia Care Complications:            No immediate complications. Estimated Blood Loss:     Estimated blood loss was minimal. Procedure:                Pre-Anesthesia Assessment:                           - Prior to the procedure, a History and Physical                            was performed, and patient medications and                            allergies were reviewed. The patient's tolerance of                            previous anesthesia was also reviewed. The risks                            and benefits of the procedure and the sedation                            options and risks were discussed with the patient.                            All questions were answered, and informed consent                            was obtained. Prior Anticoagulants: The patient has                            taken Plavix (clopidogrel), last dose was 1 day                            prior to procedure. ASA Grade Assessment: III - A                            patient with severe systemic disease. After  reviewing the risks and benefits, the patient was                            deemed in satisfactory condition to undergo the                            procedure.                           After obtaining informed consent, the endoscope was                            passed under direct vision. Throughout the                            procedure, the patient's blood pressure,  pulse, and                            oxygen saturations were monitored continuously. The                            [id number]-H190 ([id number]) Olympus endoscope was introduced                            through the mouth, and advanced to the second part                            of duodenum. The upper GI endoscopy was                            accomplished without difficulty. The patient                            tolerated the procedure well. Scope In: Scope Out: Findings:      The larynx was normal.      The esophagus was normal.      One non-bleeding, benign-appearing, cratered gastric ulcer with no       stigmata of bleeding was found in the prepyloric region of the stomach.       The lesion was 6 mm in largest dimension. Two biopsies were obtained in       the gastric body and in the gastric antrum with cold forceps for       histology (r/o H. pylori). There multiple other diminutive ulcers in       this area and scarring to indicate healed prior ulcers.      The exam of the stomach was otherwise normal.      The cardia and gastric fundus were normal on retroflexion.      The examined duodenum was normal.      No fresh or old blood in the UGI tract. Impression:               - Normal larynx.                           - Normal esophagus.                           -  Non-bleeding gastric ulcer with no stigmata of                            bleeding.                           - Normal examined duodenum.                           - Two biopsies were obtained in the gastric body                            and in the gastric antrum. Recommendation:           - Return patient to hospital ward for ongoing care.                           - Resume regular diet.                           - Stop protonix drip                           Protonix 40 mg PO twice daily                           Observe overnight - if no clinical events,                            discharge home tomorrow for  outpatient GI follow up                            and plan repeat EGD in 8 weeks                           Stop aspirin                           Resume plavix in 6 days.                           Hgb/Hct this evening and in AM                           - The findings and recommendations were discussed                            with the patient. (his wife was not available by                            phone) Procedure Code(s):        --- Professional ---                           [phone], Esophagogastroduodenoscopy, flexible,  transoral; with biopsy, single or multiple Diagnosis Code(s):        --- Professional ---                           K25.9, Gastric ulcer, unspecified as acute or                            chronic, without hemorrhage or perforation                           D62, Acute posthemorrhagic anemia                           K92.1, Melena (includes Hematochezia) CPT copyright 2019 American Medical Association. All rights reserved. The codes documented in this report are preliminary and upon coder review may  be revised to meet current compliance requirements. Raeghan Demeter L. Loletha Carrow, MD 08/26/2022 12:53:22 PM This report has been signed electronically. Number of Addenda: 0

## 2022-08-26 NOTE — ED Notes (Signed)
Patient transported to Endo 

## 2022-08-26 NOTE — Anesthesia Procedure Notes (Signed)
Procedure Name: MAC Date/Time: 08/26/2022 12:32 PM  Performed by: Lorie Phenix, CRNAPre-anesthesia Checklist: Patient identified, Emergency Drugs available, Suction available and Patient being monitored Patient Re-evaluated:Patient Re-evaluated prior to induction Oxygen Delivery Method: Nasal cannula Placement Confirmation: positive ETCO2

## 2022-08-26 NOTE — Progress Notes (Addendum)
PROGRESS NOTE    John Mccarthy  NAT:[id number] DOB: [birth year] DOA: 08/25/2022 PCP: Janith Lima, MD    Chief Complaint  Patient presents with   Dizziness    Brief Narrative:   John Mccarthy is a 71 y.o. male with medical history significant of CAD s/p PCI LAD with occlusion requiring stent x3, ischemic cardiomyopathy, BPH, hyperlipidemia who presents with rectal bleed.   Noticed dark diarrhea starting at 4am this morning. Had a total of 4 episodes. Started to feel dizziness with the first episode and on the second had brief loss of consciousness. Wife witnessed it by he decided to go back to bed until he had more dark diarrhea and became concern. Denies abdominal pain. No NSAIDS use. Cannot recall if he took morning dose of aspirin and Plavix.  Recently had recent positive cologuard test several months ago but did not want to get colonoscopy.   Hx of hematemesis in 2013 and found on endoscopy to have a large antral ulcer s/p epinephrine, small hiatal hernia and gastric erosion likely due to NG trauma. His last colonoscopy was in 2009 with moderate diverticulosis throughout the colon.   In the ED he was afebrile and mildly hypertensive with BP of 90 over 60s. This improved following NS bolus. Hemoglobin of 11.6 from a prior of 15 from just 2 months ago.  WBC of 11.8, lactate of 2.9.  Creatinine of 0.96 with elevated BUN of 39.  Positive FOBT.   CTA A/P is pending at time admission.  EDP did discuss with [facility] will see in consultation in the morning.      Assessment & Plan:   Principal Problem:   Acute GI bleeding Active Problems:   Anemia   Dyslipidemia, goal LDL below 70   CAD S/P percutaneous coronary angioplasty   Syncope   Hypotension   Acute GI bleeding Acute blood loss anemia Symptomatic anemia -No significant hemoglobin drop during hospital stay, with profuse melena -CT angio GI bleed with no active source of bleed. -Sinew to hold aspirin and  Plavix-monitor CBC closely and transfuse as needed -Received 2 units PRBC, continue to transfuse for hemoglobin less than 8 given history of CAD -Continue with Protonix drip -GI input greatly appreciated, plan for endoscopy today -Will eventually need colonoscopy, timing depends on endoscopy finding.    Hypotension Secondary to hypovolemia from acute GI bleed.   -Blood pressure remains soft, with few reading hovering of MAP around 60-65, so we will continue with IV fluids, hopefully with stabilizing GI bleed and improving volume status will avoid pressors. - Continue to follow and trend H&H.  Transfuse as needed as above.   Syncope Secondary to hypovolemia from acute GI bleed - Management as above   CAD S/P percutaneous coronary angioplasty History of PCI to LAD in 2018 with repeat in 2020 following thrombosis of the stent -Holding aspirin and Plavix for now due to acute GI bleed with symptomatic anemia.  Patient not able to recall if he took his morning dose today. -Denies any cardiac symptoms   Dyslipidemia, goal LDL below 70 Continue statin when able to take p.o.   Anemia Secondary to acute GI bleed --Hgb 11.5 down from 15 a few months ago. Continue to trend q2hr with Transfusion threshold of <10 given hx of CAD requiring repeated stenting and he was symptomatic      DVT prophylaxis: SCD Code Status: Full Family Communication: None at bedside Disposition:      Consultants:  GI  Subjective:  HEENT denies any chest pain or shortness of breath, no dizziness or lightheadedness currently  Objective: Vitals:   08/26/22 1100 08/26/22 1115 08/26/22 1130 08/26/22 1141  BP: 105/67 (!) 99/57 100/64   Pulse: 64 66 65   Resp: '19 19 17   '$ Temp:    98.6 F (37 C)  TempSrc:    Oral  SpO2: 96% 96% 97%   Weight:      Height:        Intake/Output Summary (Last 24 hours) at 08/26/2022 1148 Last data filed at 08/26/2022 0515 Gross per 24 hour  Intake 1667.5 ml  Output --   Net 1667.5 ml   Filed Weights   08/25/22 1605  Weight: 91.6 kg    Examination:   Awake Alert, Oriented X 3, No new F.N deficits, Normal affect Symmetrical Chest wall movement, Good air movement bilaterally, CTAB RRR,No Gallops,Rubs or new Murmurs, No Parasternal Heave +ve B.Sounds, Abd Soft, No tenderness, No rebound - guarding or rigidity. No Cyanosis, Clubbing or edema, No new Rash or bruise      Data Reviewed: I have personally reviewed following labs and imaging studies  CBC: Recent Labs  Lab 08/25/22 1628 08/25/22 2024 08/25/22 2210 08/26/22 0500  WBC 11.8*  --   --  11.3*  NEUTROABS 9.4*  --   --   --   HGB 11.6* 9.9* 6.1* 12.2*  HCT 36.0* 29.2* 19.3* 35.1*  MCV 94.5  --   --  88.2  PLT 208  --   --  416    Basic Metabolic Panel: Recent Labs  Lab 08/25/22 1628 08/26/22 0500  NA 139 142  K 4.5 4.2  CL 109 107  CO2 22 25  GLUCOSE 144* 101*  BUN 39* 25*  CREATININE 0.96 1.07  CALCIUM 8.5* 8.8*    GFR: Estimated Creatinine Clearance: 67.5 mL/min (by C-G formula based on SCr of 1.07 mg/dL).  Liver Function Tests: Recent Labs  Lab 08/25/22 1628  AST 22  ALT 16  ALKPHOS 67  BILITOT 0.8  PROT 6.0*  ALBUMIN 3.3*    CBG: No results for input(s): "GLUCAP" in the last 168 hours.   No results found for this or any previous visit (from the past 240 hour(s)).       Radiology Studies: CT ANGIO GI BLEED  Result Date: 08/25/2022 CLINICAL DATA:  gi bleed EXAM: CTA ABDOMEN AND PELVIS WITHOUT AND WITH CONTRAST TECHNIQUE: Multidetector CT imaging of the abdomen and pelvis was performed using the standard protocol during bolus administration of intravenous contrast. Multiplanar reconstructed images and MIPs were obtained and reviewed to evaluate the vascular anatomy. RADIATION DOSE REDUCTION: This exam was performed according to the departmental dose-optimization program which includes automated exposure control, adjustment of the mA and/or kV according  to patient size and/or use of iterative reconstruction technique. CONTRAST:  13m OMNIPAQUE IOHEXOL 350 MG/ML SOLN COMPARISON:  None FINDINGS: VASCULAR No active extravasation of contrast identified. Aorta: Mild atherosclerotic plaque. Normal caliber aorta without aneurysm, dissection, vasculitis or significant stenosis. Celiac: Patent without evidence of aneurysm, dissection, vasculitis or significant stenosis. SMA: Patent without evidence of aneurysm, dissection, vasculitis or significant stenosis. Renals: Both renal arteries are patent without evidence of aneurysm, dissection, vasculitis, fibromuscular dysplasia or significant stenosis. IMA: Patent without evidence of aneurysm, dissection, vasculitis or significant stenosis. Inflow: Mild patent without evidence of aneurysm, dissection, vasculitis or significant stenosis. Proximal Outflow: Bilateral common femoral and visualized portions of the superficial and profunda femoral arteries are patent  without evidence of aneurysm, dissection, vasculitis or significant stenosis. Veins: The main portal, splenic, superior mesenteric veins are patent. Inferior vena cava is patent. Review of the MIP images confirms the above findings. NON-VASCULAR Lower chest: Elevated right hemidiaphragm with atelectasis of the right lower lobe. No acute abnormality. Coronary artery calcification. Hepatobiliary: No focal liver abnormality. No gallstones, gallbladder wall thickening, or pericholecystic fluid. No biliary dilatation. Pancreas: Diffusely atrophic. No focal lesion. Otherwise normal pancreatic contour. No surrounding inflammatory changes. No main pancreatic ductal dilatation. Spleen: Normal in size without focal abnormality.  Splenule noted. Adrenals/Urinary Tract: No adrenal nodule bilaterally. Bilateral kidneys enhance symmetrically. Couple fluid dense lesions within the left kidney likely represent simple renal cysts (6:42,60) likely represents a simple renal cyst. Simple  renal cysts, in the absence of clinically indicated signs/symptoms, require no independent follow-up. Subcentimeter hypodensities are too small to characterize. No hydronephrosis. No hydroureter. The urinary bladder is unremarkable. Fluid density lesion Stomach/Bowel: Stomach is within normal limits. No evidence of bowel wall thickening or dilatation. Colonic diverticulosis. Second portion duodenal diverticula. Appendix appears normal. Lymphatic: No lymphadenopathy. Reproductive: Prostate is unremarkable. Other: No intraperitoneal free fluid. No intraperitoneal free gas. No organized fluid collection. Musculoskeletal: Tiny fat containing left inguinal hernia. Small fat containing umbilical hernia. No suspicious lytic or blastic osseous lesions. No acute displaced fracture. Multilevel degenerative changes of the spine. Mild to moderate bilateral hip degenerative changes, right greater than left. IMPRESSION: VASCULAR 1. No CT evidence of active bleed. 2.  Aortic Atherosclerosis (ICD10-I70.0). NON-VASCULAR 1. Colonic diverticulosis with no acute diverticulitis. 2. Tiny fat containing left inguinal hernia. Small fat containing bili cul hernia. Electronically Signed   By: Iven Finn M.D.   On: 08/25/2022 20:04        Scheduled Meds:  [GYJ Hold] sodium chloride   Intravenous Once   [MAR Hold] pantoprazole  40 mg Intravenous Q12H   Continuous Infusions:  dextrose 5 % and 0.45% NaCl Stopped (08/26/22 1130)   pantoprazole 8 mg/hr (08/26/22 1024)     LOS: 0 days        Phillips Climes, MD [facility]   To contact the attending provider between 7A-7P or the covering provider during after hours 7P-7A, please log into the web site [email or web address] and access using universal [place] password for that web site. If you do not have the password, please call the hospital operator.  08/26/2022, 11:48 AM

## 2022-08-26 NOTE — Anesthesia Preprocedure Evaluation (Signed)
Anesthesia Evaluation  Patient identified by MRN, date of birth, ID band Patient awake    Reviewed: Allergy & Precautions, NPO status , Patient's Chart, lab work & pertinent test results, reviewed documented beta blocker date and time   Airway Mallampati: II   Neck ROM: Full    Dental  (+) Caps, Dental Advisory Given   Pulmonary    breath sounds clear to auscultation       Cardiovascular hypertension, + CAD   Rhythm:Regular Rate:Normal     Neuro/Psych PSYCHIATRIC DISORDERS Depression    GI/Hepatic negative GI ROS, Neg liver ROS,   Endo/Other  negative endocrine ROS  Renal/GU negative Renal ROS     Musculoskeletal  (+) Arthritis ,   Abdominal Normal abdominal exam  (+)   Peds  Hematology negative hematology ROS (+)   Anesthesia Other Findings   Reproductive/Obstetrics                             Anesthesia Physical Anesthesia Plan  ASA: 3  Anesthesia Plan: MAC   Post-op Pain Management:    Induction: Intravenous  PONV Risk Score and Plan: 0 and Propofol infusion  Airway Management Planned: Natural Airway and Simple Face Mask  Additional Equipment: None  Intra-op Plan:   Post-operative Plan:   Informed Consent: I have reviewed the patients History and Physical, chart, labs and discussed the procedure including the risks, benefits and alternatives for the proposed anesthesia with the patient or authorized representative who has indicated his/her understanding and acceptance.       Plan Discussed with: CRNA  Anesthesia Plan Comments:         Anesthesia Quick Evaluation

## 2022-08-26 NOTE — Consult Note (Addendum)
Consultation  Referring Provider: [facility]/ Elgerawy Primary Care Physician:  Janith Lima, MD Primary Gastroenterologist:  unassigned  Reason for Consultation:  acute GI bleed  HPI: John Mccarthy is a 71 y.o. male, with history of coronary artery disease, status post PCI 2018, maintained on aspirin and Plavix, also with history of ischemic cardiomyopathy, last echo 2021 with EF 50 to 55%, no aortic stenosis, prior history of PE, BPH, osteoarthritis. Patient had acute onset early yesterday morning with melena at home.  He did have a brief syncopal episode in the bathroom, but did not actually fall or hit his head.  Relates having about 5 episodes of melena over the next few hours prior to calling 911, no bright red blood noted.  He denies any abdominal pain cramping, nausea or vomiting. Says he had not been feeling well over the past 3 to 4 weeks after he pulled his groin on a ladder and was also having symptoms from sciatica and had not been sleeping well.  He says he did not take any NSAIDs at all just his usual regimen. No nausea or abdominal discomfort or any GI symptoms prior to the abrupt onset of melena yesterday morning. Since arrival to the emergency room he was noted to be mildly hypotensive initially has been stable overnight, has not had any further episodes of melena.  He denies any chest pain or shortness of breath, does feel somewhat lightheaded if he tries to get up. Prior EGD in 2013 Dr. Cari Caraway for bleeding with finding of a large antral ulcer, with flat red spot that was treated with epi. He had colonoscopy in 2009 per Dr. Delfin Edis with finding of moderate pandiverticulosis, no polyps. He had done a recent Cologuard per his PCP which was positive and was in the process of being referred to GI to discuss colonoscopy which she had been hoping to avoid.  On admission WBC 11.6/hemoglobin 11.6/hematocrit 36 Lactate 2.9>1.3 Follow-up hemoglobin down to 9.9 and then late  last evening hemoglobin down to 6.1.  He was transfused 2 units of packed RBCs and hemoglobin up to 12.2/hematocrit 35.1 this AM. Initial BUN 39/creatinine 0.9   He has been started on PPI infusion.     Past Medical History:  Diagnosis Date   BPH (benign prostatic hyperplasia)    CAD S/P percutaneous coronary angioplasty 2018, 2020   anterior STEMI twice- LAD DES   GI bleeding 2013   Hyperlipidemia    Pulmonary embolism ([place]) 06/15/2013   after achilles tendon repair- Xarelto 6 months   Skin cancer (melanoma) ([place])     Past Surgical History:  Procedure Laterality Date   ACHILLES TENDON SURGERY Right 05/18/2013   CARDIAC CATHETERIZATION     CORONARY STENT INTERVENTION N/A 02/11/2017   Procedure: Coronary Stent Intervention;  Surgeon: Lorretta Harp, MD;  Location: [facility];  Service: Cardiovascular;  Laterality: N/A;   CORONARY/GRAFT ACUTE MI REVASCULARIZATION N/A 06/28/2019   Procedure: Coronary/Graft Acute MI Revascularization;  Surgeon: Lorretta Harp, MD;  Location: [facility];  Service: Cardiovascular;  Laterality: N/A;   ESOPHAGOGASTRODUODENOSCOPY  09/24/2012   Procedure: ESOPHAGOGASTRODUODENOSCOPY (EGD);  Surgeon: Lear Ng, MD;  Location: [facility] END[facility];  Service: Endoscopy;  Laterality: N/A;   HAND SURGERY     LEFT HEART CATH AND CORONARY ANGIOGRAPHY N/A 02/11/2017   Procedure: Left Heart Cath and Coronary Angiography;  Surgeon: Lorretta Harp, MD;  Location: [facility];  Service: Cardiovascular;  Laterality: N/A;  LEFT HEART CATH AND CORONARY ANGIOGRAPHY N/A 06/28/2019   Procedure: LEFT HEART CATH AND CORONARY ANGIOGRAPHY;  Surgeon: Lorretta Harp, MD;  Location: [facility] [facility];  Service: Cardiovascular;  Laterality: N/A;   NOSE SURGERY  08/11/2019   removal of melenoma     Prior to Admission medications   Medication Sig Start Date End Date Taking? Authorizing Provider  aspirin EC 81 MG tablet Take 1 tablet (81 mg total)  by mouth daily. 05/31/15  Yes Janith Lima, MD  atorvastatin (LIPITOR) 80 MG tablet TAKE 1 TABLET BY MOUTH EVERY DAY AT 6 PM Patient taking differently: Take 80 mg by mouth daily. 01/02/22  Yes Lorretta Harp, MD  carvedilol (COREG) 6.25 MG tablet TAKE 1 TABLET BY MOUTH 2 TIMES DAILY WITH A MEAL. Patient taking differently: Take 6.25 mg by mouth 2 (two) times daily with a meal. 09/18/21  Yes Lorretta Harp, MD  clopidogrel (PLAVIX) 75 MG tablet TAKE 1 TABLET BY MOUTH EVERY DAY Patient taking differently: Take 75 mg by mouth daily. 05/06/22  Yes Lorretta Harp, MD  Multiple Vitamins-Minerals (CENTRUM SILVER 50+MEN PO) Take 1 tablet by mouth daily.   Yes [provider]  nitroGLYCERIN (NITROSTAT) 0.4 MG SL tablet Place 1 tablet (0.4 mg total) under the tongue every 5 (five) minutes as needed for chest pain. 07/08/19 10/12/22 Yes Cleaver, Jossie Ng, NP  sertraline (ZOLOFT) 50 MG tablet Take 1 tablet (50 mg total) by mouth daily. 06/20/22  Yes Janith Lima, MD  ciclopirox (LOPROX) 0.77 % cream Apply topically 2 (two) times daily. Patient not taking: Reported on 08/26/2022 06/20/22   Janith Lima, MD    Current Facility-Administered Medications  Medication Dose Route Frequency Provider Last Rate Last Admin   0.9 %  sodium chloride infusion (Manually program via Guardrails IV Fluids)   Intravenous Once Tu, Ching T, DO   Held at 08/25/22 2337   pantoprazole (PROTONIX) 80 mg /NS 100 mL IVPB  80 mg Intravenous Once Elgergawy, Silver Huguenin, MD       [START ON 08/29/2022] pantoprazole (PROTONIX) injection 40 mg  40 mg Intravenous Q12H Elgergawy, Silver Huguenin, MD       pantoprozole (PROTONIX) 80 mg /NS 100 mL infusion  8 mg/hr Intravenous Continuous Elgergawy, Silver Huguenin, MD       Current Outpatient Medications  Medication Sig Dispense Refill   aspirin EC 81 MG tablet Take 1 tablet (81 mg total) by mouth daily. 90 tablet 3   atorvastatin (LIPITOR) 80 MG tablet TAKE 1 TABLET BY MOUTH EVERY DAY AT 6 PM  (Patient taking differently: Take 80 mg by mouth daily.) 90 tablet 3   carvedilol (COREG) 6.25 MG tablet TAKE 1 TABLET BY MOUTH 2 TIMES DAILY WITH A MEAL. (Patient taking differently: Take 6.25 mg by mouth 2 (two) times daily with a meal.) 180 tablet 3   clopidogrel (PLAVIX) 75 MG tablet TAKE 1 TABLET BY MOUTH EVERY DAY (Patient taking differently: Take 75 mg by mouth daily.) 90 tablet 1   Multiple Vitamins-Minerals (CENTRUM SILVER 50+MEN PO) Take 1 tablet by mouth daily.     nitroGLYCERIN (NITROSTAT) 0.4 MG SL tablet Place 1 tablet (0.4 mg total) under the tongue every 5 (five) minutes as needed for chest pain. 25 tablet 3   sertraline (ZOLOFT) 50 MG tablet Take 1 tablet (50 mg total) by mouth daily. 90 tablet 1   ciclopirox (LOPROX) 0.77 % cream Apply topically 2 (two) times daily. (Patient not taking:  Reported on 08/26/2022) 90 g 1    Allergies as of 08/25/2022 - Review Complete 08/25/2022  Allergen Reaction Noted   Crestor [rosuvastatin calcium] Other (See Comments) 10/15/2016   Lactose intolerance (gi) Diarrhea 09/25/2012    Family History  Problem Relation Age of Onset   Heart failure Father    Coronary artery disease Sister 50   Cancer Neg Hx    COPD Neg Hx    Stroke Neg Hx    Kidney disease Neg Hx    Hypertension Neg Hx    Hyperlipidemia Neg Hx    Heart disease Neg Hx    Hearing loss Neg Hx    Early death Neg Hx    Drug abuse Neg Hx    Diabetes Neg Hx    Depression Neg Hx     Social History   Socioeconomic History   Marital status: Married    Spouse name: Olin Hauser   Number of children: Not on file   Years of education: Not on file   Highest education level: Not on file  Occupational History   Occupation: retired  Tobacco Use   Smoking status: Never   Smokeless tobacco: Never  Vaping Use   Vaping Use: Never used  Substance and Sexual Activity   Alcohol use: Yes    Alcohol/week: 5.0 standard drinks of alcohol    Types: 5 Shots of liquor per week    Comment:  occassional   Drug use: No   Sexual activity: Yes    Birth control/protection: None  Other Topics Concern   Not on file  Social History Narrative   Lives in 2 story home with wife   Children live nearby   Social Determinants of Health   Financial Resource Strain: Low Risk  (02/22/2022)   Overall Financial Resource Strain (CARDIA)    Difficulty of Paying Living Expenses: Not hard at all  Food Insecurity: No Food Insecurity (02/22/2022)   Hunger Vital Sign    Worried About Running Out of Food in the Last Year: Never true    Ran Out of Food in the Last Year: Never true  Transportation Needs: No Transportation Needs (02/22/2022)   PRAPARE - Hydrologist (Medical): No    Lack of Transportation (Non-Medical): No  Physical Activity: Insufficiently Active (02/22/2022)   Exercise Vital Sign    Days of Exercise per Week: 3 days    Minutes of Exercise per Session: 30 min  Stress: No Stress Concern Present (02/22/2022)   [place]    Feeling of Stress : Not at all  Social Connections: [place] (02/22/2022)   Social Connection and Isolation Panel [NHANES]    Frequency of Communication with Friends and Family: More than three times a week    Frequency of Social Gatherings with Friends and Family: More than three times a week    Attends Religious Services: More than 4 times per year    Active Member of [employer] or Organizations: Yes    Attends Music therapist: More than 4 times per year    Marital Status: Married  Human resources officer Violence: Not At Risk (02/22/2022)   Humiliation, Afraid, Rape, and Kick questionnaire    Fear of Current or Ex-Partner: No    Emotionally Abused: No    Physically Abused: No    Sexually Abused: No    Review of Systems: Pertinent positive and negative review of systems were noted in the above  HPI section.  All other review of systems was otherwise  negative.   Physical Exam: Vital signs in last 24 hours: Temp:  [98.2 F (36.8 C)-99 F (37.2 C)] 98.8 F (37.1 C) (09/25 0745) Pulse Rate:  [63-95] 66 (09/25 0730) Resp:  [14-25] 15 (09/25 0730) BP: (93-116)/(52-69) 104/66 (09/25 0730) SpO2:  [94 %-99 %] 96 % (09/25 0730) Weight:  [91.6 kg] 91.6 kg (09/24 1605)   General:   Alert,  Well-developed, well-nourished, white male pleasant and cooperative in NAD Head:  Normocephalic and atraumatic. Eyes:  Sclera clear, no icterus.   Conjunctiva pink. Ears:  Normal auditory acuity. Nose:  No deformity, discharge,  or lesions. Mouth:  No deformity or lesions.   Neck:  Supple; no masses or thyromegaly. Lungs:  Clear throughout to auscultation.   No wheezes, crackles, or rhonchi. Heart:  Regular rate and rhythm; no murmurs, clicks, rubs,  or gallops. Abdomen:  Soft,nontender, BS active,nonpalp mass or hsm.   Rectal not done Msk:  Symmetrical without gross deformities. . Pulses:  Normal pulses noted. Extremities:  Without clubbing or edema. Neurologic:  Alert and  oriented x4;  grossly normal neurologically. Skin:  Intact without significant lesions or rashes.. Psych:  Alert and cooperative. Normal mood and affect.  Intake/Output from previous day: 09/24 0701 - 09/25 0700 In: 1667.5 [Blood:667.5; IV Piggyback:1000] Out: -  Intake/Output this shift: No intake/output data recorded.  Lab Results: Recent Labs    08/25/22 1628 08/25/22 2024 08/25/22 2210 08/26/22 0500  WBC 11.8*  --   --  11.3*  HGB 11.6* 9.9* 6.1* 12.2*  HCT 36.0* 29.2* 19.3* 35.1*  PLT 208  --   --  186   BMET Recent Labs    08/25/22 1628 08/26/22 0500  NA 139 142  K 4.5 4.2  CL 109 107  CO2 22 25  GLUCOSE 144* 101*  BUN 39* 25*  CREATININE 0.96 1.07  CALCIUM 8.5* 8.8*   LFT Recent Labs    08/25/22 1628  PROT 6.0*  ALBUMIN 3.3*  AST 22  ALT 16  ALKPHOS 67  BILITOT 0.8   PT/INR Recent Labs    08/25/22 1628  LABPROT 14.6  INR 1.1    Hepatitis Panel No results for input(s): "HEPBSAG", "HCVAB", "HEPAIGM", "HEPBIGM" in the last 72 hours.    IMPRESSION:  #50 71 year old white male, on chronic Plavix and aspirin presenting with acute onset of painless melena yesterday morning.  He had about 5 episodes prior to coming to the emergency room and did have a brief syncopal episode at home. No associated abdominal pain or cramping no nausea or vomiting. No further active bleeding since arrival in the emergency room  Initial hemoglobin 11.6> 9.9> 6.1> transfused 2 units> 12.2  Suspect hemoglobin of 6.1 was spurious. Patient has prior history of GI bleed secondary to antral ulcer 2013. Current bleeding consistent with upper GI bleed, rule out recurrent peptic ulcer disease.  #2 anemia secondary to acute GI blood loss stable post transfusions #3 history of pandiverticulosis #4recent positive Cologuard #5 ischemic cardiomyopathy most recent EF 50 to 55% #6 coronary artery disease status post stent 2018 #7 chronic antiplatelet therapy on Plavix and aspirin #8 hyperlipidemia    PLAN: Keep n.p.o. PPI infusion Start IV fluids at 75 cc/h Continue serial hemoglobins every 6 hours, transfuse as indicated for hemoglobin 8 or less due to symptomatic state Hold aspirin and Plavix-last dose Saturday evening Patient will be scheduled for EGD with Dr. Loletha Carrow today.  Procedure was  discussed in detail with the patient including indications risk and benefits and he is agreeable to proceed. Further recommendations pending results of EGD He will also eventually need colonoscopy, timing inpatient versus outpatient based on findings of EGD. I will follow with you   Amy EsterwoodPA-C  08/26/2022, 8:30 AM   I have taken an interval history, thoroughly reviewed the chart and examined the patient. I agree with the Advanced Practitioner's note, impression and recommendations, and have recorded additional findings, impressions and  recommendations below. I performed a substantive portion of this encounter (>50% time spent), including a complete performance of the medical decision making.  My additional thoughts are as follows:  71 year old man on aspirin and Plavix with melena and marked acute blood loss anemia. No chest pain dyspnea or abdominal pain or other chronic upper digestive symptoms. Suspect gastric or duodenal ulcer.  Transfusion required overnight, currently hemodynamically stable.  He has good IV access, is on acid suppression, is alert and conversational and agreeable to upper endoscopy.  The benefits and risks of the planned procedure were described in detail with the patient or (when appropriate) their health care proxy.  Risks were outlined as including, but not limited to, bleeding, infection, perforation, adverse medication reaction leading to cardiac or pulmonary decompensation, pancreatitis (if ERCP).  The limitation of incomplete mucosal visualization was also discussed.  No guarantees or warranties were given. Patient at increased risk for cardiopulmonary complications of procedure due to medical comorbidities.    [name] [phone]

## 2022-08-26 NOTE — Interval H&P Note (Signed)
History and Physical Interval Note:  08/26/2022 12:35 PM  John Mccarthy  has presented today for surgery, with the diagnosis of GI bleed.  The various methods of treatment have been discussed with the patient and family. After consideration of risks, benefits and other options for treatment, the patient has consented to  Procedure(s): ESOPHAGOGASTRODUODENOSCOPY (EGD) WITH PROPOFOL (N/A) as a surgical intervention.  The patient's history has been reviewed, patient examined, no change in status, stable for surgery.  I have reviewed the patient's chart and labs.  Questions were answered to the patient's satisfaction.     [name]

## 2022-08-26 NOTE — Anesthesia Postprocedure Evaluation (Signed)
Anesthesia Post Note  Patient: John Mccarthy  Procedure(s) Performed: ESOPHAGOGASTRODUODENOSCOPY (EGD) WITH PROPOFOL BIOPSY     Patient location during evaluation: PACU Anesthesia Type: MAC Level of consciousness: awake and alert Pain management: pain level controlled Vital Signs Assessment: post-procedure vital signs reviewed and stable Respiratory status: spontaneous breathing, nonlabored ventilation, respiratory function stable and patient connected to nasal cannula oxygen Cardiovascular status: stable and blood pressure returned to baseline Postop Assessment: no apparent nausea or vomiting Anesthetic complications: no   No notable events documented.  Last Vitals:  Vitals:   08/26/22 1315 08/26/22 1330  BP: 107/65 110/64  Pulse: 60 60  Resp: (!) 22 (!) 23  Temp: 36.8 C   SpO2: 95% 97%    Last Pain:  Vitals:   08/26/22 1315  TempSrc:   PainSc: 0-No pain                 Effie Berkshire

## 2022-08-26 NOTE — Transfer of Care (Signed)
Immediate Anesthesia Transfer of Care Note  Patient: John Mccarthy  Procedure(s) Performed: ESOPHAGOGASTRODUODENOSCOPY (EGD) WITH PROPOFOL BIOPSY  Patient Location: PACU  Anesthesia Type:MAC  Level of Consciousness: awake and alert   Airway & Oxygen Therapy: Patient Spontanous Breathing  Post-op Assessment: Report given to RN and Post -op Vital signs reviewed and stable  Post vital signs: Reviewed and stable  Last Vitals:  Vitals Value Taken Time  BP 96/49 08/26/22 1254  Temp    Pulse 64 08/26/22 1255  Resp 21 08/26/22 1255  SpO2 95 % 08/26/22 1255  Vitals shown include unvalidated device data.  Last Pain:  Vitals:   08/26/22 1148  TempSrc: Oral  PainSc: 0-No pain         Complications: No notable events documented.

## 2022-08-27 ENCOUNTER — Encounter (HOSPITAL_COMMUNITY): Payer: Self-pay | Admitting: Gastroenterology

## 2022-08-27 DIAGNOSIS — K922 Gastrointestinal hemorrhage, unspecified: Secondary | ICD-10-CM | POA: Diagnosis not present

## 2022-08-27 DIAGNOSIS — D62 Acute posthemorrhagic anemia: Secondary | ICD-10-CM | POA: Diagnosis not present

## 2022-08-27 DIAGNOSIS — Z9861 Coronary angioplasty status: Secondary | ICD-10-CM | POA: Diagnosis not present

## 2022-08-27 DIAGNOSIS — K921 Melena: Secondary | ICD-10-CM

## 2022-08-27 DIAGNOSIS — I2510 Atherosclerotic heart disease of native coronary artery without angina pectoris: Secondary | ICD-10-CM | POA: Diagnosis not present

## 2022-08-27 DIAGNOSIS — K254 Chronic or unspecified gastric ulcer with hemorrhage: Principal | ICD-10-CM

## 2022-08-27 DIAGNOSIS — Z7902 Long term (current) use of antithrombotics/antiplatelets: Secondary | ICD-10-CM | POA: Diagnosis not present

## 2022-08-27 LAB — CBC
HCT: 29.3 % — ABNORMAL LOW (ref 39.0–52.0)
Hemoglobin: 10.1 g/dL — ABNORMAL LOW (ref 13.0–17.0)
MCH: 30.1 pg (ref 26.0–34.0)
MCHC: 34.5 g/dL (ref 30.0–36.0)
MCV: 87.5 fL (ref 80.0–100.0)
Platelets: 148 10*3/uL — ABNORMAL LOW (ref 150–400)
RBC: 3.35 MIL/uL — ABNORMAL LOW (ref 4.22–5.81)
RDW: 14.4 % (ref 11.5–15.5)
WBC: 8.1 10*3/uL (ref 4.0–10.5)
nRBC: 0 % (ref 0.0–0.2)

## 2022-08-27 LAB — BASIC METABOLIC PANEL
Anion gap: 6 (ref 5–15)
BUN: 16 mg/dL (ref 8–23)
CO2: 23 mmol/L (ref 22–32)
Calcium: 7.9 mg/dL — ABNORMAL LOW (ref 8.9–10.3)
Chloride: 109 mmol/L (ref 98–111)
Creatinine, Ser: 0.98 mg/dL (ref 0.61–1.24)
GFR, Estimated: >60 mL/min (ref >60)
Glucose, Bld: 106 mg/dL — ABNORMAL HIGH (ref 70–99)
Potassium: 3.5 mmol/L (ref 3.5–5.1)
Sodium: 138 mmol/L (ref 135–145)

## 2022-08-27 LAB — SURGICAL PATHOLOGY

## 2022-08-27 MED ORDER — CLOPIDOGREL BISULFATE 75 MG PO TABS: 75.0000 mg | ORAL_TABLET | Freq: Every day | ORAL | 1 refills | Status: DC

## 2022-08-27 MED ORDER — DEXTROSE-NACL 5-0.45 % IV SOLN: INTRAVENOUS | Status: DC

## 2022-08-27 MED ORDER — FERROUS SULFATE 325 (65 FE) MG PO TBEC: 325.0000 mg | DELAYED_RELEASE_TABLET | Freq: Two times a day (BID) | ORAL | 3 refills | Status: DC

## 2022-08-27 MED ORDER — PANTOPRAZOLE SODIUM 40 MG PO TBEC: 40.0000 mg | DELAYED_RELEASE_TABLET | Freq: Two times a day (BID) | ORAL | 1 refills | Status: DC

## 2022-08-27 MED ORDER — LACTATED RINGERS IV BOLUS
500.0000 mL | Freq: Once | INTRAVENOUS | Status: AC
  Administered 2022-08-27: 500 mL via INTRAVENOUS

## 2022-08-27 NOTE — Progress Notes (Signed)
HOSPITAL MEDICINE OVERNIGHT EVENT NOTE    Notified by nursing that patient has been exhibiting low blood pressures this morning as low as 86/48.  Patient has exhibited no associated change in symptoms or mentation.  Patient is not exhibiting any evidence of ongoing bleeding.  We will go ahead and administer a 500 cc bolus of lactated Ringer solution followed by resumption of previously ordered half-normal saline infusion.    Vernelle Emerald  MD [facility]

## 2022-08-27 NOTE — Discharge Instructions (Signed)
Follow with Primary MD John Lima, MD in 7 days   Get CBC, CMP,  checked  by Primary MD next visit.    Activity: As tolerated with Full fall precautions use walker/cane & assistance as needed   Disposition Home    Diet: Regular diet  On your next visit with your primary care physician please Get Medicines reviewed and adjusted.   Please request your Prim.MD to go over all Hospital Tests and Procedure/Radiological results at the follow up, please get all Hospital records sent to your Prim MD by signing hospital release before you go home.   If you experience worsening of your admission symptoms, develop shortness of breath, life threatening emergency, suicidal or homicidal thoughts you must seek medical attention immediately by calling 911 or calling your MD immediately  if symptoms less severe.  You Must read complete instructions/literature along with all the possible adverse reactions/side effects for all the Medicines you take and that have been prescribed to you. Take any new Medicines after you have completely understood and accpet all the possible adverse reactions/side effects.   Do not drive, operating heavy machinery, perform activities at heights, swimming or participation in water activities or provide baby sitting services if your were admitted for syncope or siezures until you have seen by Primary MD or a Neurologist and advised to do so again.  Do not drive when taking Pain medications.    Do not take more than prescribed Pain, Sleep and Anxiety Medications  Special Instructions: If you have smoked or chewed Tobacco  in the last 2 yrs please stop smoking, stop any regular Alcohol  and or any Recreational drug use.  Wear Seat belts while driving.   Please note  You were cared for by a hospitalist during your hospital stay. If you have any questions about your discharge medications or the care you received while you were in the hospital after you are discharged,  you can call the unit and asked to speak with the hospitalist on call if the hospitalist that took care of you is not available. Once you are discharged, your primary care physician will handle any further medical issues. Please note that NO REFILLS for any discharge medications will be authorized once you are discharged, as it is imperative that you return to your primary care physician (or establish a relationship with a primary care physician if you do not have one) for your aftercare needs so that they can reassess your need for medications and monitor your lab values.

## 2022-08-27 NOTE — Discharge Summary (Signed)
Physician Discharge Summary  PRANEETH BUSSEY INO:[id number] DOB: 04/09/1951 DOA: 08/25/2022  PCP: Janith Lima, MD  Admit date: 08/25/2022 Discharge date: 08/27/2022  Admitted From: Home Disposition:  Home   Recommendations for Outpatient Follow-up:  Follow up with PCP in 1-2 weeks Please obtain BMP/CBC in one week Follow-up on final result with H Pylori  stomach biopsy    Discharge Condition:Stable CODE STATUS:FULL Diet recommendation: Heart Healthy  Brief/Interim Summary:  John Mccarthy is a 71 y.o. male with medical history significant of CAD s/p PCI LAD with occlusion requiring stent x3, ischemic cardiomyopathy, BPH, hyperlipidemia who presents with rectal bleed. Hx of hematemesis in 2013 and found on endoscopy to have a large antral ulcer s/p epinephrine, small hiatal hernia and gastric erosion likely due to NG trauma. His last colonoscopy was in 2009 with moderate diverticulosis throughout the colon. -Hemoglobin dropped from 15 >> 6.1, but he did receive aggressive IV hydration, patient was transfused PRBC, and endoscopy significant for nonbleeding postpyloric ulcer, please see discussion below.     Acute GI bleeding Acute blood loss anemia Symptomatic anemia -significant hemoglobin drop during hospital stay, with profuse melena, hemoglobin 15 on admission, dropped to 6.1, patient was transfused 2 units PRBC, with good response, hemoglobin is 10.6 at time of discharge. -CT angio GI bleed with no active source of bleed. -Input greatly appreciated, s/p endoscopy 9/25 significant for clean-based gastric ulcer, cause suspected to be either aspirin or H. pylori, gastric biopsies pending to rule out H. pylori. -Hemoglobin stable overnight, no further episodes of bleeding, cleared by GI for discharge, he will be discharged on heart healthy diet with recommendation to discontinue aspirin on discharge, may resume Plavix in 5 days, he will be started on iron supplements,  Protonix 40 mg oral twice daily.      Hypotension Secondary to hypovolemia from acute GI bleed.   -Patient with some baseline hypotension, he received appropriate IV hydration, he remains asymptomatic.   Syncope Secondary to hypovolemia from acute GI bleed   CAD S/P percutaneous coronary angioplasty History of PCI to LAD in 2018 with repeat in 2020 following thrombosis of the stent -Denies any cardiac symptoms -See above regarding recommendations for splint and Plavix   Dyslipidemia, goal LDL below 70 Continue statin when able to take p.o.   Discharge Diagnoses:  Principal Problem:   Acute GI bleeding Active Problems:   Anemia   Dyslipidemia, goal LDL below 70   CAD S/P percutaneous coronary angioplasty   Syncope   Hypotension   Chronic gastric ulcer with hemorrhage   Melena   Acute blood loss anemia     Discharge Instructions  Discharge Instructions     Diet - low sodium heart healthy   Complete by: As directed    Discharge instructions   Complete by: As directed    Follow with Primary MD Janith Lima, MD in 7 days   Get CBC, CMP,  checked  by Primary MD next visit.    Activity: As tolerated with Full fall precautions use walker/cane & assistance as needed   Disposition Home    Diet: Regular diet  On your next visit with your primary care physician please Get Medicines reviewed and adjusted.   Please request your Prim.MD to go over all Hospital Tests and Procedure/Radiological results at the follow up, please get all Hospital records sent to your Prim MD by signing hospital release before you go home.   If you experience worsening of your admission symptoms, develop  shortness of breath, life threatening emergency, suicidal or homicidal thoughts you must seek medical attention immediately by calling 911 or calling your MD immediately  if symptoms less severe.  You Must read complete instructions/literature along with all the possible adverse  reactions/side effects for all the Medicines you take and that have been prescribed to you. Take any new Medicines after you have completely understood and accpet all the possible adverse reactions/side effects.   Do not drive, operating heavy machinery, perform activities at heights, swimming or participation in water activities or provide baby sitting services if your were admitted for syncope or siezures until you have seen by Primary MD or a Neurologist and advised to do so again.  Do not drive when taking Pain medications.    Do not take more than prescribed Pain, Sleep and Anxiety Medications  Special Instructions: If you have smoked or chewed Tobacco  in the last 2 yrs please stop smoking, stop any regular Alcohol  and or any Recreational drug use.  Wear Seat belts while driving.   Please note  You were cared for by a hospitalist during your hospital stay. If you have any questions about your discharge medications or the care you received while you were in the hospital after you are discharged, you can call the unit and asked to speak with the hospitalist on call if the hospitalist that took care of you is not available. Once you are discharged, your primary care physician will handle any further medical issues. Please note that NO REFILLS for any discharge medications will be authorized once you are discharged, as it is imperative that you return to your primary care physician (or establish a relationship with a primary care physician if you do not have one) for your aftercare needs so that they can reassess your need for medications and monitor your lab values.   Increase activity slowly   Complete by: As directed       Allergies as of 08/27/2022       Reactions   Crestor [rosuvastatin Calcium] Other (See Comments)   Muscle aches   Lactose Intolerance (gi) Diarrhea        Medication List     STOP taking these medications    aspirin EC 81 MG tablet       TAKE these  medications    atorvastatin 80 MG tablet Commonly known as: LIPITOR TAKE 1 TABLET BY MOUTH EVERY DAY AT 6 PM What changed: See the new instructions.   carvedilol 6.25 MG tablet Commonly known as: COREG TAKE 1 TABLET BY MOUTH 2 TIMES DAILY WITH A MEAL.   CENTRUM SILVER 50+MEN PO Take 1 tablet by mouth daily.   ciclopirox 0.77 % cream Commonly known as: Loprox Apply topically 2 (two) times daily.   clopidogrel 75 MG tablet Commonly known as: PLAVIX Take 1 tablet (75 mg total) by mouth daily. Start taking on: September 02, 2022 What changed: These instructions start on September 02, 2022. If you are unsure what to do until then, ask your doctor or other care provider.   ferrous sulfate 325 (65 FE) MG EC tablet Take 1 tablet (325 mg total) by mouth 2 (two) times daily.   nitroGLYCERIN 0.4 MG SL tablet Commonly known as: NITROSTAT Place 1 tablet (0.4 mg total) under the tongue every 5 (five) minutes as needed for chest pain.   pantoprazole 40 MG tablet Commonly known as: PROTONIX Take 1 tablet (40 mg total) by mouth 2 (two) times daily before  a meal.   sertraline 50 MG tablet Commonly known as: ZOLOFT Take 1 tablet (50 mg total) by mouth daily.        Allergies  Allergen Reactions   Crestor [Rosuvastatin Calcium] Other (See Comments)    Muscle aches   Lactose Intolerance (Gi) Diarrhea    Consultations: GI   Procedures/Studies: CT ANGIO GI BLEED  Result Date: 08/25/2022 CLINICAL DATA:  gi bleed EXAM: CTA ABDOMEN AND PELVIS WITHOUT AND WITH CONTRAST TECHNIQUE: Multidetector CT imaging of the abdomen and pelvis was performed using the standard protocol during bolus administration of intravenous contrast. Multiplanar reconstructed images and MIPs were obtained and reviewed to evaluate the vascular anatomy. RADIATION DOSE REDUCTION: This exam was performed according to the departmental dose-optimization program which includes automated exposure control, adjustment of the mA  and/or kV according to patient size and/or use of iterative reconstruction technique. CONTRAST:  138m OMNIPAQUE IOHEXOL 350 MG/ML SOLN COMPARISON:  None FINDINGS: VASCULAR No active extravasation of contrast identified. Aorta: Mild atherosclerotic plaque. Normal caliber aorta without aneurysm, dissection, vasculitis or significant stenosis. Celiac: Patent without evidence of aneurysm, dissection, vasculitis or significant stenosis. SMA: Patent without evidence of aneurysm, dissection, vasculitis or significant stenosis. Renals: Both renal arteries are patent without evidence of aneurysm, dissection, vasculitis, fibromuscular dysplasia or significant stenosis. IMA: Patent without evidence of aneurysm, dissection, vasculitis or significant stenosis. Inflow: Mild patent without evidence of aneurysm, dissection, vasculitis or significant stenosis. Proximal Outflow: Bilateral common femoral and visualized portions of the superficial and profunda femoral arteries are patent without evidence of aneurysm, dissection, vasculitis or significant stenosis. Veins: The main portal, splenic, superior mesenteric veins are patent. Inferior vena cava is patent. Review of the MIP images confirms the above findings. NON-VASCULAR Lower chest: Elevated right hemidiaphragm with atelectasis of the right lower lobe. No acute abnormality. Coronary artery calcification. Hepatobiliary: No focal liver abnormality. No gallstones, gallbladder wall thickening, or pericholecystic fluid. No biliary dilatation. Pancreas: Diffusely atrophic. No focal lesion. Otherwise normal pancreatic contour. No surrounding inflammatory changes. No main pancreatic ductal dilatation. Spleen: Normal in size without focal abnormality.  Splenule noted. Adrenals/Urinary Tract: No adrenal nodule bilaterally. Bilateral kidneys enhance symmetrically. Couple fluid dense lesions within the left kidney likely represent simple renal cysts (6:42,60) likely represents a simple  renal cyst. Simple renal cysts, in the absence of clinically indicated signs/symptoms, require no independent follow-up. Subcentimeter hypodensities are too small to characterize. No hydronephrosis. No hydroureter. The urinary bladder is unremarkable. Fluid density lesion Stomach/Bowel: Stomach is within normal limits. No evidence of bowel wall thickening or dilatation. Colonic diverticulosis. Second portion duodenal diverticula. Appendix appears normal. Lymphatic: No lymphadenopathy. Reproductive: Prostate is unremarkable. Other: No intraperitoneal free fluid. No intraperitoneal free gas. No organized fluid collection. Musculoskeletal: Tiny fat containing left inguinal hernia. Small fat containing umbilical hernia. No suspicious lytic or blastic osseous lesions. No acute displaced fracture. Multilevel degenerative changes of the spine. Mild to moderate bilateral hip degenerative changes, right greater than left. IMPRESSION: VASCULAR 1. No CT evidence of active bleed. 2.  Aortic Atherosclerosis (ICD10-I70.0). NON-VASCULAR 1. Colonic diverticulosis with no acute diverticulitis. 2. Tiny fat containing left inguinal hernia. Small fat containing bili cul hernia. Electronically Signed   By: MIven FinnM.D.   On: 08/25/2022 20:04      Subjective: No significant events overnight, patient denies any coffee-ground emesis, nausea, vomiting, no dizziness or lightheadedness, he ambulated in the hallway couple times with staff, no dizziness, lightheadedness, no chest pain.  Discharge Exam: Vitals:   08/27/22 0800  08/27/22 1235  BP: (!) 102/58 (!) 98/46  Pulse: 61 (!) 58  Resp: 17 18  Temp: 98.2 F (36.8 C) 98.3 F (36.8 C)  SpO2: 93% 96%   Vitals:   08/26/22 2135 08/27/22 0000 08/27/22 0800 08/27/22 1235  BP: (!) 86/48 (!) 97/44 (!) 102/58 (!) 98/46  Pulse: 69 (!) 57 61 (!) 58  Resp: '18 18 17 18  '$ Temp: 98.3 F (36.8 C) 98 F (36.7 C) 98.2 F (36.8 C) 98.3 F (36.8 C)  TempSrc: Oral Oral Oral Oral   SpO2: 96% 95% 93% 96%  Weight:      Height:        General: Pt is alert, awake, not in acute distress Cardiovascular: RRR, S1/S2 +, no rubs, no gallops Respiratory: CTA bilaterally, no wheezing, no rhonchi Abdominal: Soft, NT, ND, bowel sounds + Extremities: no edema, no cyanosis    The results of significant diagnostics from this hospitalization (including imaging, microbiology, ancillary and laboratory) are listed below for reference.     Microbiology: No results found for this or any previous visit (from the past 240 hour(s)).   Labs: BNP (last 3 results) No results for input(s): "BNP" in the last 8760 hours. Basic Metabolic Panel: Recent Labs  Lab 08/25/22 1628 08/26/22 0500 08/27/22 0340  NA 139 142 138  K 4.5 4.2 3.5  CL 109 107 109  CO2 '22 25 23  '$ GLUCOSE 144* 101* 106*  BUN 39* 25* 16  CREATININE 0.96 1.07 0.98  CALCIUM 8.5* 8.8* 7.9*   Liver Function Tests: Recent Labs  Lab 08/25/22 1628  AST 22  ALT 16  ALKPHOS 67  BILITOT 0.8  PROT 6.0*  ALBUMIN 3.3*   No results for input(s): "LIPASE", "AMYLASE" in the last 168 hours. No results for input(s): "AMMONIA" in the last 168 hours. CBC: Recent Labs  Lab 08/25/22 1628 08/25/22 2024 08/25/22 2210 08/26/22 0500 08/26/22 1643 08/27/22 0340  WBC 11.8*  --   --  11.3*  --  8.1  NEUTROABS 9.4*  --   --   --   --   --   HGB 11.6* 9.9* 6.1* 12.2* 11.6* 10.1*  HCT 36.0* 29.2* 19.3* 35.1* 33.5* 29.3*  MCV 94.5  --   --  88.2  --  87.5  PLT 208  --   --  186  --  148*   Cardiac Enzymes: No results for input(s): "CKTOTAL", "CKMB", "CKMBINDEX", "TROPONINI" in the last 168 hours. BNP: Invalid input(s): "POCBNP" CBG: No results for input(s): "GLUCAP" in the last 168 hours. D-Dimer No results for input(s): "DDIMER" in the last 72 hours. Hgb A1c No results for input(s): "HGBA1C" in the last 72 hours. Lipid Profile No results for input(s): "CHOL", "HDL", "LDLCALC", "TRIG", "CHOLHDL", "LDLDIRECT" in the  last 72 hours. Thyroid function studies No results for input(s): "TSH", "T4TOTAL", "T3FREE", "THYROIDAB" in the last 72 hours.  Invalid input(s): "FREET3" Anemia work up No results for input(s): "VITAMINB12", "FOLATE", "FERRITIN", "TIBC", "IRON", "RETICCTPCT" in the last 72 hours. Urinalysis    Component Value Date/Time   COLORURINE YELLOW 06/20/2022 1000   APPEARANCEUR CLEAR 06/20/2022 1000   LABSPEC 1.015 06/20/2022 1000   PHURINE 6.0 06/20/2022 1000   GLUCOSEU NEGATIVE 06/20/2022 1000   HGBUR NEGATIVE 06/20/2022 1000   HGBUR negative 11/15/2008 0000   BILIRUBINUR NEGATIVE 06/20/2022 1000   KETONESUR NEGATIVE 06/20/2022 1000   UROBILINOGEN 0.2 06/20/2022 1000   NITRITE NEGATIVE 06/20/2022 1000   LEUKOCYTESUR NEGATIVE 06/20/2022 1000   Sepsis Labs  Recent Labs  Lab 08/25/22 1628 08/26/22 0500 08/27/22 0340  WBC 11.8* 11.3* 8.1   Microbiology No results found for this or any previous visit (from the past 240 hour(s)).   Time coordinating discharge: Over 30 minutes  SIGNED:   Phillips Climes, MD  [facility] 08/27/2022, 2:17 PM Pager   If 7PM-7AM, please contact night-coverage [email or web address]

## 2022-08-27 NOTE — Plan of Care (Signed)
  Problem: Education: Goal: Knowledge of General Education information will improve Description Including pain rating scale, medication(s)/side effects and non-pharmacologic comfort measures Outcome: Progressing   Problem: Health Behavior/Discharge Planning: Goal: Ability to manage health-related needs will improve Outcome: Progressing   

## 2022-08-27 NOTE — Progress Notes (Addendum)
[facility] Progress Note  Chief Complaint: Gastric ulcer with bleed  History:  He feels well today, denying chest pain, dyspnea or abdominal pain.  No hematemesis and no further melena since admission.  He had an episode of hypotension overnight requiring a 500 cc bolus, but was reportedly asymptomatic and had no evidence of overt bleeding with that.   Objective:   Current Facility-Administered Medications:    [COMPLETED] lactated ringers bolus 500 mL, 500 mL, Intravenous, Once, Last Rate: 500 mL/hr at 08/27/22 0535, 500 mL at 08/27/22 0535 **FOLLOWED BY** dextrose 5 %-0.45 % sodium chloride infusion, , Intravenous, Continuous, Shalhoub, Sherryll Burger, MD, Last Rate: 75 mL/hr at 08/27/22 0605, New Bag at 08/27/22 0605   ondansetron (ZOFRAN) injection 4 mg, 4 mg, Intravenous, Q6H PRN, Danis, Estill Cotta III, MD   pantoprazole (PROTONIX) EC tablet 40 mg, 40 mg, Oral, BID AC, [name], MD, 40 mg at 08/27/22 0818   dextrose 5 % and 0.45% NaCl 75 mL/hr at 08/27/22 0109     Vital signs in last 24 hrs: Vitals:   08/27/22 0000 08/27/22 0800  BP: (!) 97/44 (!) 102/58  Pulse: (!) 57 61  Resp: 18 17  Temp: 98 F (36.7 C) 98.2 F (36.8 C)  SpO2: 95% 93%    Intake/Output Summary (Last 24 hours) at 08/27/2022 1216 Last data filed at 08/26/2022 1510 Gross per 24 hour  Intake 213.42 ml  Output --  Net 213.42 ml     Physical Exam Alert and conversational, well-appearing HEENT: sclera anicteric, oral mucosa without lesions Neck: supple, no thyromegaly, JVD or lymphadenopathy Cardiac: RRR without murmurs, S1S2 heard, no peripheral edema Pulm: clear to auscultation bilaterally, normal RR and effort noted Abdomen: soft, no tenderness, with active bowel sounds. No guarding or palpable hepatosplenomegaly Skin; warm and dry, no jaundice  Recent Labs:     Latest Ref Rng & Units 08/27/2022    3:40 AM 08/26/2022    4:43 PM 08/26/2022    5:00 AM  CBC  WBC 4.0 - 10.5 K/uL 8.1   11.3    Hemoglobin 13.0 - 17.0 g/dL 10.1  11.6  12.2   Hematocrit 39.0 - 52.0 % 29.3  33.5  35.1   Platelets 150 - 400 K/uL 148   186     Recent Labs  Lab 08/25/22 1628  INR 1.1      Latest Ref Rng & Units 08/27/2022    3:40 AM 08/26/2022    5:00 AM 08/25/2022    4:28 PM  CMP  Glucose 70 - 99 mg/dL 106  101  144   BUN 8 - 23 mg/dL 16  25  39   Creatinine 0.61 - 1.24 mg/dL 0.98  1.07  0.96   Sodium 135 - 145 mmol/L 138  142  139   Potassium 3.5 - 5.1 mmol/L 3.5  4.2  4.5   Chloride 98 - 111 mmol/L 109  107  109   CO2 22 - 32 mmol/L '23  25  22   '$ Calcium 8.9 - 10.3 mg/dL 7.9  8.8  8.5   Total Protein 6.5 - 8.1 g/dL   6.0   Total Bilirubin 0.3 - 1.2 mg/dL   0.8   Alkaline Phos 38 - 126 U/L   67   AST 15 - 41 U/L   22   ALT 0 - 44 U/L   16      Radiologic studies:   Assessment & Plan  Assessment: Melena Acute blood  loss anemia Clean-based gastric ulcer, cause suspected to be either aspirin or H. pylori.  Gastric biopsies pending to rule out H. pylori.  His BUN has normalized, he has a drop in hemoglobin but without overt bleeding and I suspect it is volume equilibration.  I also suspect of the hemoglobin of 12.2 was spuriously high.   Plan: He can go home today from a GI perspective with the following plan:  No aspirin at least until gastric biopsies return  Plavix can be resumed 5 days from today.  Pantoprazole 40 mg twice daily  Iron sulfate 325 mg daily - -please let him know we will turn his stool dark so as not to be confused for recurrent GI bleeding.  My office will contact him with follow-up plans.  Regular diet  (Discussed with Dr. Waldron Labs)  [name] Office: [phone]

## 2022-08-28 ENCOUNTER — Other Ambulatory Visit: Payer: Self-pay | Admitting: Internal Medicine

## 2022-08-29 ENCOUNTER — Telehealth: Payer: Self-pay

## 2022-08-29 LAB — TYPE AND SCREEN
ABO/RH(D): A POS
Antibody Screen: NEGATIVE
Unit division: 0
Unit division: 0
Unit division: 0
Unit division: 0

## 2022-08-29 LAB — BPAM RBC
Blood Product Expiration Date: 202310132359
Blood Product Expiration Date: 202310132359
Blood Product Expiration Date: 202310132359
Blood Product Expiration Date: 202310132359
ISSUE DATE / TIME: 202309242321
ISSUE DATE / TIME: 202309250235
Unit Type and Rh: 6200
Unit Type and Rh: 6200
Unit Type and Rh: 6200
Unit Type and Rh: 6200

## 2022-08-29 NOTE — Telephone Encounter (Signed)
Transition Care Management Follow-up Telephone Call Date of discharge and from where: 08/27/2022 FROM [facility] Diagnosis: K92.1 MELENA How have you been since you were released from the hospital? "FEELING FINE" Any questions or concerns? No  Items Reviewed: Did the pt receive and understand the discharge instructions provided? Yes  Medications obtained and verified? Yes  Other? No  Any new allergies since your discharge? No  Dietary orders reviewed? Yes; HEART HEALTHY DIET Do you have support at home? Yes ; [place] and Equipment/Supplies: Were home health services ordered? no If so, what is the name of the agency? NONE  Has the agency set up a time to come to the patient's home? not applicable Were any new equipment or medical supplies ordered?  No What is the name of the medical supply agency? N/A Were you able to get the supplies/equipment? not applicable Do you have any questions related to the use of the equipment or supplies? No  Functional Questionnaire: (I = Independent and D = Dependent) ADLs: I  Bathing/Dressing- I  Meal Prep- I  Eating- I  Maintaining continence- I  Transferring/Ambulation- I  Managing Meds- I  Follow up appointments reviewed:  PCP Hospital f/u appt confirmed? Yes  Scheduled to see Scarlette Calico, MD on 09/04/2022 @ 9:20 AM. [facility] f/u appt confirmed? Yes  Scheduled to see Wilfrid Lund III, MD on 09/25/2022 @ 11:00 AM. Are transportation arrangements needed? No  If their condition worsens, is the pt aware to call PCP or go to the Emergency Dept.? Yes Was the patient provided with contact information for the PCP's office or ED? Yes Was to pt encouraged to call back with questions or concerns? Yes

## 2022-08-30 ENCOUNTER — Other Ambulatory Visit: Payer: Self-pay

## 2022-08-30 DIAGNOSIS — D649 Anemia, unspecified: Secondary | ICD-10-CM

## 2022-08-30 DIAGNOSIS — K254 Chronic or unspecified gastric ulcer with hemorrhage: Secondary | ICD-10-CM

## 2022-09-01 ENCOUNTER — Other Ambulatory Visit: Payer: Self-pay | Admitting: Cardiovascular Disease

## 2022-09-04 ENCOUNTER — Encounter: Payer: Self-pay | Admitting: Internal Medicine

## 2022-09-04 ENCOUNTER — Ambulatory Visit (INDEPENDENT_AMBULATORY_CARE_PROVIDER_SITE_OTHER): Payer: Medicare Other | Admitting: Internal Medicine

## 2022-09-04 VITALS — BP 124/78 | HR 72 | Temp 98.1°F | Resp 16 | Ht 72.0 in | Wt 203.0 lb

## 2022-09-04 DIAGNOSIS — I2510 Atherosclerotic heart disease of native coronary artery without angina pectoris: Secondary | ICD-10-CM | POA: Diagnosis not present

## 2022-09-04 DIAGNOSIS — D539 Nutritional anemia, unspecified: Secondary | ICD-10-CM | POA: Diagnosis not present

## 2022-09-04 DIAGNOSIS — Z9861 Coronary angioplasty status: Secondary | ICD-10-CM | POA: Diagnosis not present

## 2022-09-04 DIAGNOSIS — D696 Thrombocytopenia, unspecified: Secondary | ICD-10-CM | POA: Diagnosis not present

## 2022-09-04 DIAGNOSIS — L98492 Non-pressure chronic ulcer of skin of other sites with fat layer exposed: Secondary | ICD-10-CM | POA: Insufficient documentation

## 2022-09-04 LAB — CBC WITH DIFFERENTIAL/PLATELET
Basophils Absolute: 0.1 10*3/uL (ref 0.0–0.1)
Basophils Relative: 0.9 % (ref 0.0–3.0)
Eosinophils Absolute: 0.2 10*3/uL (ref 0.0–0.7)
Eosinophils Relative: 2.6 % (ref 0.0–5.0)
HCT: 39.4 % (ref 39.0–52.0)
Hemoglobin: 12.9 g/dL — ABNORMAL LOW (ref 13.0–17.0)
Lymphocytes Relative: 16.4 % (ref 12.0–46.0)
Lymphs Abs: 1.5 10*3/uL (ref 0.7–4.0)
MCHC: 32.7 g/dL (ref 30.0–36.0)
MCV: 90.3 fl (ref 78.0–100.0)
Monocytes Absolute: 0.6 10*3/uL (ref 0.1–1.0)
Monocytes Relative: 7.1 % (ref 3.0–12.0)
Neutro Abs: 6.6 10*3/uL (ref 1.4–7.7)
Neutrophils Relative %: 73 % (ref 43.0–77.0)
Platelets: 224 10*3/uL (ref 150.0–400.0)
RBC: 4.36 Mil/uL (ref 4.22–5.81)
RDW: 15.2 % (ref 11.5–15.5)
WBC: 9.1 10*3/uL (ref 4.0–10.5)

## 2022-09-04 LAB — IBC + FERRITIN
Ferritin: 158.7 ng/mL (ref 22.0–322.0)
Iron: 45 ug/dL (ref 42–165)
Saturation Ratios: 15.9 % — ABNORMAL LOW (ref 20.0–50.0)
TIBC: 282.8 ug/dL (ref 250.0–450.0)
Transferrin: 202 mg/dL — ABNORMAL LOW (ref 212.0–360.0)

## 2022-09-04 LAB — VITAMIN B12: Vitamin B-12: 636 pg/mL (ref 211–911)

## 2022-09-04 LAB — FOLATE: Folate: 10.7 ng/mL (ref >5.9)

## 2022-09-04 NOTE — Patient Instructions (Signed)

## 2022-09-04 NOTE — Progress Notes (Signed)
Subjective:  Patient ID: John Mccarthy, male    DOB: 24-Oct-1951  Age: 71 y.o. MRN: [id number]  CC: Anemia   HPI John Mccarthy presents for f/up -    He was recently admitted for symptomatic anemia and was found to have a gastric ulcer.  His weakness and fatigue have greatly improved.  He denies chest pain, shortness of breath, abdominal pain, melena, or bright red blood per rectum.   Admit date: 08/25/2022 Discharge date: 08/27/2022   Admitted From: Home Disposition:  Home    Recommendations for Outpatient Follow-up:  Follow up with PCP in 1-2 weeks Please obtain BMP/CBC in one week Follow-up on final result with H Pylori  stomach biopsy       Discharge Condition:Stable CODE STATUS:FULL Diet recommendation: Heart Healthy   Brief/Interim Summary:   John Mccarthy is a 71 y.o. male with medical history significant of CAD s/p PCI LAD with occlusion requiring stent x3, ischemic cardiomyopathy, BPH, hyperlipidemia who presents with rectal bleed. Hx of hematemesis in 2013 and found on endoscopy to have a large antral ulcer s/p epinephrine, small hiatal hernia and gastric erosion likely due to NG trauma. His last colonoscopy was in 2009 with moderate diverticulosis throughout the colon. -Hemoglobin dropped from 15 >> 6.1, but he did receive aggressive IV hydration, patient was transfused PRBC, and endoscopy significant for nonbleeding postpyloric ulcer, please see discussion below.       Acute GI bleeding Acute blood loss anemia Symptomatic anemia -significant hemoglobin drop during hospital stay, with profuse melena, hemoglobin 15 on admission, dropped to 6.1, patient was transfused 2 units PRBC, with good response, hemoglobin is 10.6 at time of discharge. -CT angio GI bleed with no active source of bleed. -Input greatly appreciated, s/p endoscopy 9/25 significant for clean-based gastric ulcer, cause suspected to be either aspirin or H. pylori, gastric biopsies  pending to rule out H. pylori. -Hemoglobin stable overnight, no further episodes of bleeding, cleared by GI for discharge, he will be discharged on heart healthy diet with recommendation to discontinue aspirin on discharge, may resume Plavix in 5 days, he will be started on iron supplements, Protonix 40 mg oral twice daily.     Outpatient Medications Prior to Visit  Medication Sig Dispense Refill   atorvastatin (LIPITOR) 80 MG tablet TAKE 1 TABLET BY MOUTH EVERY DAY AT 6 PM (Patient taking differently: Take 80 mg by mouth daily.) 90 tablet 3   carvedilol (COREG) 6.25 MG tablet Take 1 tablet (6.25 mg total) by mouth 2 (two) times daily with a meal. Schedule an appointment for further refills 60 tablet 0   ciclopirox (LOPROX) 0.77 % cream Apply topically 2 (two) times daily. 90 g 1   clopidogrel (PLAVIX) 75 MG tablet Take 1 tablet (75 mg total) by mouth daily. 90 tablet 1   ferrous sulfate 325 (65 FE) MG EC tablet Take 1 tablet (325 mg total) by mouth 2 (two) times daily. 60 tablet 3   Multiple Vitamins-Minerals (CENTRUM SILVER 50+MEN PO) Take 1 tablet by mouth daily.     nitroGLYCERIN (NITROSTAT) 0.4 MG SL tablet Place 1 tablet (0.4 mg total) under the tongue every 5 (five) minutes as needed for chest pain. 25 tablet 3   pantoprazole (PROTONIX) 40 MG tablet Take 1 tablet (40 mg total) by mouth 2 (two) times daily before a meal. 60 tablet 1   sertraline (ZOLOFT) 50 MG tablet Take 1 tablet (50 mg total) by mouth daily. 90 tablet 1  No facility-administered medications prior to visit.    ROS Review of Systems  Constitutional:  Positive for fatigue. Negative for chills, diaphoresis and unexpected weight change.  Eyes: Negative.   Respiratory:  Negative for cough, chest tightness, shortness of breath and wheezing.   Cardiovascular:  Negative for chest pain, palpitations and leg swelling.  Gastrointestinal:  Negative for abdominal pain, blood in stool, diarrhea, nausea and vomiting.  Endocrine:  Negative.   Genitourinary: Negative.  Negative for difficulty urinating.  Musculoskeletal: Negative.   Skin: Negative.   Neurological:  Positive for weakness. Negative for dizziness, light-headedness and headaches.  Hematological:  Negative for adenopathy. Does not bruise/bleed easily.  Psychiatric/Behavioral: Negative.      Objective:  BP 124/78 (BP Location: Left Arm, Patient Position: Sitting, Cuff Size: Large)   Pulse 72   Temp 98.1 F (36.7 C) (Oral)   Resp 16   Ht 6' (1.829 m)   Wt 203 lb (92.1 kg)   SpO2 94%   BMI 27.53 kg/m   BP Readings from Last 3 Encounters:  09/04/22 124/78  08/27/22 (!) 98/46  06/20/22 110/64    Wt Readings from Last 3 Encounters:  09/04/22 203 lb (92.1 kg)  08/26/22 201 lb 15.1 oz (91.6 kg)  06/20/22 211 lb (95.7 kg)    Physical Exam Vitals reviewed.  HENT:     Nose: Nose normal.  Eyes:     General: No scleral icterus.    Conjunctiva/sclera: Conjunctivae normal.  Cardiovascular:     Rate and Rhythm: Normal rate and regular rhythm.     Heart sounds: No murmur heard. Pulmonary:     Effort: Pulmonary effort is normal.     Breath sounds: No stridor. No wheezing, rhonchi or rales.  Abdominal:     Palpations: There is no mass.     Tenderness: There is no abdominal tenderness. There is no guarding.     Hernia: No hernia is present.  Musculoskeletal:        General: Normal range of motion.     Cervical back: Neck supple.     Right lower leg: No edema.     Left lower leg: No edema.  Lymphadenopathy:     Cervical: No cervical adenopathy.  Skin:    General: Skin is warm and dry.  Neurological:     General: No focal deficit present.     Mental Status: He is alert.  Psychiatric:        Mood and Affect: Mood normal.        Behavior: Behavior normal.     Lab Results  Component Value Date   WBC 9.1 09/04/2022   HGB 12.9 (L) 09/04/2022   HCT 39.4 09/04/2022   PLT 224.0 09/04/2022   GLUCOSE 106 (H) 08/27/2022   CHOL 130  06/20/2022   TRIG 153.0 (H) 06/20/2022   HDL 37.50 (L) 06/20/2022   LDLDIRECT 146.0 10/15/2016   LDLCALC 61 06/20/2022   ALT 16 08/25/2022   AST 22 08/25/2022   NA 138 08/27/2022   K 3.5 08/27/2022   CL 109 08/27/2022   CREATININE 0.98 08/27/2022   BUN 16 08/27/2022   CO2 23 08/27/2022   TSH 4.29 06/20/2022   PSA 1.10 06/20/2022   INR 1.1 08/25/2022   HGBA1C 5.5 10/15/2016    CT ANGIO GI BLEED  Result Date: 08/25/2022 CLINICAL DATA:  gi bleed EXAM: CTA ABDOMEN AND PELVIS WITHOUT AND WITH CONTRAST TECHNIQUE: Multidetector CT imaging of the abdomen and pelvis was performed using the  standard protocol during bolus administration of intravenous contrast. Multiplanar reconstructed images and MIPs were obtained and reviewed to evaluate the vascular anatomy. RADIATION DOSE REDUCTION: This exam was performed according to the departmental dose-optimization program which includes automated exposure control, adjustment of the mA and/or kV according to patient size and/or use of iterative reconstruction technique. CONTRAST:  183m OMNIPAQUE IOHEXOL 350 MG/ML SOLN COMPARISON:  None FINDINGS: VASCULAR No active extravasation of contrast identified. Aorta: Mild atherosclerotic plaque. Normal caliber aorta without aneurysm, dissection, vasculitis or significant stenosis. Celiac: Patent without evidence of aneurysm, dissection, vasculitis or significant stenosis. SMA: Patent without evidence of aneurysm, dissection, vasculitis or significant stenosis. Renals: Both renal arteries are patent without evidence of aneurysm, dissection, vasculitis, fibromuscular dysplasia or significant stenosis. IMA: Patent without evidence of aneurysm, dissection, vasculitis or significant stenosis. Inflow: Mild patent without evidence of aneurysm, dissection, vasculitis or significant stenosis. Proximal Outflow: Bilateral common femoral and visualized portions of the superficial and profunda femoral arteries are patent without  evidence of aneurysm, dissection, vasculitis or significant stenosis. Veins: The main portal, splenic, superior mesenteric veins are patent. Inferior vena cava is patent. Review of the MIP images confirms the above findings. NON-VASCULAR Lower chest: Elevated right hemidiaphragm with atelectasis of the right lower lobe. No acute abnormality. Coronary artery calcification. Hepatobiliary: No focal liver abnormality. No gallstones, gallbladder wall thickening, or pericholecystic fluid. No biliary dilatation. Pancreas: Diffusely atrophic. No focal lesion. Otherwise normal pancreatic contour. No surrounding inflammatory changes. No main pancreatic ductal dilatation. Spleen: Normal in size without focal abnormality.  Splenule noted. Adrenals/Urinary Tract: No adrenal nodule bilaterally. Bilateral kidneys enhance symmetrically. Couple fluid dense lesions within the left kidney likely represent simple renal cysts (6:42,60) likely represents a simple renal cyst. Simple renal cysts, in the absence of clinically indicated signs/symptoms, require no independent follow-up. Subcentimeter hypodensities are too small to characterize. No hydronephrosis. No hydroureter. The urinary bladder is unremarkable. Fluid density lesion Stomach/Bowel: Stomach is within normal limits. No evidence of bowel wall thickening or dilatation. Colonic diverticulosis. Second portion duodenal diverticula. Appendix appears normal. Lymphatic: No lymphadenopathy. Reproductive: Prostate is unremarkable. Other: No intraperitoneal free fluid. No intraperitoneal free gas. No organized fluid collection. Musculoskeletal: Tiny fat containing left inguinal hernia. Small fat containing umbilical hernia. No suspicious lytic or blastic osseous lesions. No acute displaced fracture. Multilevel degenerative changes of the spine. Mild to moderate bilateral hip degenerative changes, right greater than left. IMPRESSION: VASCULAR 1. No CT evidence of active bleed. 2.  Aortic  Atherosclerosis (ICD10-I70.0). NON-VASCULAR 1. Colonic diverticulosis with no acute diverticulitis. 2. Tiny fat containing left inguinal hernia. Small fat containing bili cul hernia. Electronically Signed   By: MIven FinnM.D.   On: 08/25/2022 20:04    Assessment & Plan:   RSarahwas seen today for anemia.  Diagnoses and all orders for this visit:  Deficiency anemia- H/H have improved and vitamin levels are normal. -     Vitamin B12; Future -     IBC + Ferritin; Future -     Folate; Future -     Zinc; Future -     Vitamin B1; Future -     CBC with Differential/Platelet; Future -     Vitamin B12 -     IBC + Ferritin -     Folate -     Zinc -     Vitamin B1 -     CBC with Differential/Platelet  Thrombocytopenia ([place]- His PLTs are normal now. -     Vitamin B12;  Future -     IBC + Ferritin; Future -     Folate; Future -     Zinc; Future -     Vitamin B1; Future -     CBC with Differential/Platelet; Future -     Vitamin B12 -     IBC + Ferritin -     Folate -     Zinc -     Vitamin B1 -     CBC with Differential/Platelet  Callous ulcer, with fat layer exposed ([place]) -     Ambulatory referral to Podiatry   I am having John Mccarthy maintain his Multiple Vitamins-Minerals (CENTRUM SILVER 50+MEN PO), nitroGLYCERIN, atorvastatin, ciclopirox, sertraline, pantoprazole, clopidogrel, ferrous sulfate, and carvedilol.  No orders of the defined types were placed in this encounter.    Follow-up: Return in about 3 months (around 12/05/2022).  Scarlette Calico, MD

## 2022-09-07 LAB — VITAMIN B1: Vitamin B1 (Thiamine): 10 nmol/L (ref 8–30)

## 2022-09-07 LAB — ZINC: Zinc: 85 ug/dL (ref 60–130)

## 2022-09-10 ENCOUNTER — Encounter: Payer: Self-pay | Admitting: Cardiovascular Disease

## 2022-09-10 ENCOUNTER — Ambulatory Visit: Payer: Medicare Other | Attending: Cardiovascular Disease | Admitting: Cardiovascular Disease

## 2022-09-10 DIAGNOSIS — Z9861 Coronary angioplasty status: Secondary | ICD-10-CM | POA: Insufficient documentation

## 2022-09-10 DIAGNOSIS — I2510 Atherosclerotic heart disease of native coronary artery without angina pectoris: Secondary | ICD-10-CM | POA: Insufficient documentation

## 2022-09-10 DIAGNOSIS — E785 Hyperlipidemia, unspecified: Secondary | ICD-10-CM | POA: Diagnosis not present

## 2022-09-10 NOTE — Progress Notes (Signed)
09/10/2022 John Mccarthy   Apr 23, 1951  [id number]  Primary Physician Janith Lima, MD Primary Cardiologist: Lorretta Harp MD Lupe Carney, [place]  HPI:  John Mccarthy is a 71 y.o.  mildly overweight married Caucasian male  father of 2 grandfather of 4 grandchildren who is retired as a Conservator, museum/gallery for heavy Academic librarian. I last saw him in the office    04/17/2021.Marland Kitchen He had anterior STEMI  on 02/11/17. He is brought to the cath lab emergently and radial cath by myself revealing an ostial LAD occlusion which I stented with a synergy (3 mm x 12 mm) drug-eluting stent. He did have a 60% proximal to mid dominant RCA stenosis which I elected to treat medically. EF was 50% with anteroapical wall motion abnormality.   His door to balloon time was 22 minutes.  Follow-up 2-D echocardiogram performed 06/24/17 revealed normalization of LV function.  He was on dual antiplatelet therapy including aspirin and Plavix until recently when he discontinued the Plavix which is appropriate.  He is asymptomatic back playing tennis several times a week including singles tennis 1-1/2 hours at a time without limitation.    I last saw him in the hospital this past July when he presented with an anterior STEMI with occlusion of the previously placed stent.  I intervened on him 06/28/2019 and placed 3 additional stents in his proximal LAD.  His durable in time was 46 minutes, delayed because of hypotension and reperfusion arrhythmias.  He did have a 99% ostial first diagonal branch stenosis which was jailed vessel and up in the previously placed stents as well as a 70% mid dominant RCA stenosis that was stable.  His EF was 45 to 50% with anteroapical hypokinesia.  He is asymptomatic.  He is out walking every day, and is currently participating in cardiac rehab. He remains on dual antiplatelet therapy as well as high-dose statin therapy.  He checks his blood pressure at home religiously which runs in the  110/70 range.   Since I saw him in the office a year and a half ago he was admitted to the hospital 08/25/2022 with a upper GI bleed.  He underwent endoscopy revealing ulcer.  He bled down to hemoglobin of 6.  His hemoglobin currently is 12.9.  Aspirin discontinued.  He did describe dyspnea at that time.  He remains on clopidogrel and denies chest pain.   Current Meds  Medication Sig   atorvastatin (LIPITOR) 80 MG tablet TAKE 1 TABLET BY MOUTH EVERY DAY AT 6 PM (Patient taking differently: Take 80 mg by mouth daily.)   carvedilol (COREG) 6.25 MG tablet Take 1 tablet (6.25 mg total) by mouth 2 (two) times daily with a meal. Schedule an appointment for further refills   ciclopirox (LOPROX) 0.77 % cream Apply topically 2 (two) times daily.   ferrous sulfate 325 (65 FE) MG EC tablet Take 1 tablet (325 mg total) by mouth 2 (two) times daily.   Multiple Vitamins-Minerals (CENTRUM SILVER 50+MEN PO) Take 1 tablet by mouth daily.   nitroGLYCERIN (NITROSTAT) 0.4 MG SL tablet Place 1 tablet (0.4 mg total) under the tongue every 5 (five) minutes as needed for chest pain.   pantoprazole (PROTONIX) 40 MG tablet Take 1 tablet (40 mg total) by mouth 2 (two) times daily before a meal.   sertraline (ZOLOFT) 50 MG tablet Take 1 tablet (50 mg total) by mouth daily.     Allergies  Allergen Reactions   Crestor [  Rosuvastatin Calcium] Other (See Comments)    Muscle aches   Lactose Intolerance (Gi) Diarrhea    Social History   Socioeconomic History   Marital status: Married    Spouse name: Olin Hauser   Number of children: Not on file   Years of education: Not on file   Highest education level: Not on file  Occupational History   Occupation: retired  Tobacco Use   Smoking status: Never   Smokeless tobacco: Never  Vaping Use   Vaping Use: Never used  Substance and Sexual Activity   Alcohol use: Yes    Alcohol/week: 5.0 standard drinks of alcohol    Types: 5 Shots of liquor per week    Comment: occassional    Drug use: No   Sexual activity: Yes    Birth control/protection: None  Other Topics Concern   Not on file  Social History Narrative   Lives in 2 story home with wife   Children live nearby   Social Determinants of Health   Financial Resource Strain: Low Risk  (02/22/2022)   Overall Financial Resource Strain (CARDIA)    Difficulty of Paying Living Expenses: Not hard at all  Food Insecurity: No Food Insecurity (02/22/2022)   Hunger Vital Sign    Worried About Running Out of Food in the Last Year: Never true    Ran Out of Food in the Last Year: Never true  Transportation Needs: No Transportation Needs (02/22/2022)   PRAPARE - Hydrologist (Medical): No    Lack of Transportation (Non-Medical): No  Physical Activity: Insufficiently Active (02/22/2022)   Exercise Vital Sign    Days of Exercise per Week: 3 days    Minutes of Exercise per Session: 30 min  Stress: No Stress Concern Present (02/22/2022)   [place]    Feeling of Stress : Not at all  Social Connections: [place] (02/22/2022)   Social Connection and Isolation Panel [NHANES]    Frequency of Communication with Friends and Family: More than three times a week    Frequency of Social Gatherings with Friends and Family: More than three times a week    Attends Religious Services: More than 4 times per year    Active Member of [employer] or Organizations: Yes    Attends Music therapist: More than 4 times per year    Marital Status: Married  Human resources officer Violence: Not At Risk (02/22/2022)   Humiliation, Afraid, Rape, and Kick questionnaire    Fear of Current or Ex-Partner: No    Emotionally Abused: No    Physically Abused: No    Sexually Abused: No     Review of Systems: General: negative for chills, fever, night sweats or weight changes.  Cardiovascular: negative for chest pain, dyspnea on exertion, edema,  orthopnea, palpitations, paroxysmal nocturnal dyspnea or shortness of breath Dermatological: negative for rash Respiratory: negative for cough or wheezing Urologic: negative for hematuria Abdominal: negative for nausea, vomiting, diarrhea, bright red blood per rectum, melena, or hematemesis Neurologic: negative for visual changes, syncope, or dizziness All other systems reviewed and are otherwise negative except as noted above.    Blood pressure 100/60, pulse 69, height 6' (1.829 m), weight 205 lb (93 kg), SpO2 92 %.  General appearance: alert and no distress Neck: no adenopathy, no carotid bruit, no JVD, supple, symmetrical, trachea midline, and thyroid not enlarged, symmetric, no tenderness/mass/nodules Lungs: clear to auscultation bilaterally Heart: regular rate and  rhythm, S1, S2 normal, no murmur, click, rub or gallop Extremities: extremities normal, atraumatic, no cyanosis or edema Pulses: 2+ and symmetric Skin: Skin color, texture, turgor normal. No rashes or lesions Neurologic: Grossly normal  EKG not performed today  ASSESSMENT AND PLAN:   Dyslipidemia, goal LDL below 70 History of dyslipidemia on high-dose atorvastatin with lipid profile performed 06/20/2022 revealing total cholesterol 130, LDL 61 and HDL 37.  CAD S/P percutaneous coronary angioplasty History of CAD status post anterior STEMI 02/11/2017 with ostial the occlusion which I stented with a 3 mm x 12 mm long Synergy drug-eluting stent.  He had 60% proximal to mid dominant RCA stenosis which I elected to treat medically.  His EF at that time was 50% with a door to balloon time of 22 minutes.  He returned with an anterior STEMI 06/28/2019 secondary to thrombotic occlusion of his previously placed stent.  3 additional stents were placed in his LAD.  He did have a 99% ostial first diagonal branch which was jailed by the stent and treated medically.  His RCA at that time was 70% and stable.  His EF was in the 45 to 50% range  which ultimately is somewhat improved.  He recently had a GI bleed with an blood down to hemoglobin of 6.  Aspirin discontinued but he continues on clopidogrel.  His hemoglobin is now 12.9.  His symptoms were dyspnea.     Lorretta Harp MD FACP,FACC,FAHA, [facility] 09/10/2022 9:40 AM

## 2022-09-10 NOTE — Assessment & Plan Note (Signed)
History of dyslipidemia on high-dose atorvastatin with lipid profile performed 06/20/2022 revealing total cholesterol 130, LDL 61 and HDL 37.

## 2022-09-10 NOTE — Patient Instructions (Signed)
Medication Instructions:  Your physician recommends that you continue on your current medications as directed. Please refer to the Current Medication list given to you today.  *If you need a refill on your cardiac medications before your next appointment, please call your pharmacy*   Follow-Up: At [facility], you and your health needs are our priority.  As part of our continuing mission to provide you with exceptional heart care, we have created designated Provider Care Teams.  These Care Teams include your primary Cardiologist (physician) and Advanced Practice Providers (APPs -  Physician Assistants and Nurse Practitioners) who all work together to provide you with the care you need, when you need it.  We recommend signing up for the patient portal called "MyChart".  Sign up information is provided on this After Visit Summary.  MyChart is used to connect with patients for Virtual Visits (Telemedicine).  Patients are able to view lab/test results, encounter notes, upcoming appointments, etc.  Non-urgent messages can be sent to your provider as well.   To learn more about what you can do with MyChart, go to [email or web address].    Your next appointment:   12 month(s)  The format for your next appointment:   In Person  Provider:   Jonathan Berry, MD   

## 2022-09-10 NOTE — Assessment & Plan Note (Signed)
History of CAD status post anterior STEMI 02/11/2017 with ostial the occlusion which I stented with a 3 mm x 12 mm long Synergy drug-eluting stent.  He had 60% proximal to mid dominant RCA stenosis which I elected to treat medically.  His EF at that time was 50% with a door to balloon time of 22 minutes.  He returned with an anterior STEMI 06/28/2019 secondary to thrombotic occlusion of his previously placed stent.  3 additional stents were placed in his LAD.  He did have a 99% ostial first diagonal branch which was jailed by the stent and treated medically.  His RCA at that time was 70% and stable.  His EF was in the 45 to 50% range which ultimately is somewhat improved.  He recently had a GI bleed with an blood down to hemoglobin of 6.  Aspirin discontinued but he continues on clopidogrel.  His hemoglobin is now 12.9.  His symptoms were dyspnea.

## 2022-09-12 ENCOUNTER — Telehealth: Payer: Self-pay

## 2022-09-12 NOTE — Telephone Encounter (Signed)
-----   Message from [place], MD sent at 09/12/2022  4:14 PM EDT ----- Regarding: RE: Labs Thanks for checking on that and sending me a note. Hemoglobin has normalized  - he does not need any more labs prior to the upcoming clinic visit with me.  -HD ----- Message ----- From: Yevette Edwards, RN Sent: 09/11/2022  10:03 AM EDT To: Doran Stabler, MD Subject: Melton Alar: Labs                                       Dr. Loletha Carrow,  This patient is due for repeat labs at this time. Looks like he had labs drawn at [facility] office on 09/04/22. Results are available in epic. Thanks ----- Message ----- From: Yevette Edwards, RN Sent: 09/11/2022  12:00 AM EDT To: Yevette Edwards, RN Subject: Labs                                           CBC - order in epic

## 2022-09-12 NOTE — Telephone Encounter (Signed)
Called and spoke with patient. He is aware that Hgb has normalized and no need for repeat labs prior to his follow up appt. Pt verbalized understanding and had no concerns at the end of the call.

## 2022-09-16 ENCOUNTER — Ambulatory Visit (INDEPENDENT_AMBULATORY_CARE_PROVIDER_SITE_OTHER): Payer: Medicare Other

## 2022-09-16 ENCOUNTER — Ambulatory Visit (INDEPENDENT_AMBULATORY_CARE_PROVIDER_SITE_OTHER): Payer: Medicare Other | Admitting: Podiatry

## 2022-09-16 DIAGNOSIS — M216X1 Other acquired deformities of right foot: Secondary | ICD-10-CM | POA: Diagnosis not present

## 2022-09-16 DIAGNOSIS — M79671 Pain in right foot: Secondary | ICD-10-CM | POA: Diagnosis not present

## 2022-09-16 DIAGNOSIS — L84 Corns and callosities: Secondary | ICD-10-CM

## 2022-09-16 DIAGNOSIS — I2510 Atherosclerotic heart disease of native coronary artery without angina pectoris: Secondary | ICD-10-CM

## 2022-09-16 DIAGNOSIS — Z9861 Coronary angioplasty status: Secondary | ICD-10-CM | POA: Diagnosis not present

## 2022-09-16 DIAGNOSIS — Z7901 Long term (current) use of anticoagulants: Secondary | ICD-10-CM | POA: Diagnosis not present

## 2022-09-16 NOTE — Progress Notes (Unsigned)
Subjective:   Patient ID: John Mccarthy, male   DOB: 71 y.o.   MRN: [id number]   HPI Chief Complaint  Patient presents with   Callouses    Right foot callus started 2 years ago, patient stated he uses that foot to push off in tennis, rate of pain 6 out of 10, TX: filing, soaking, pads, X-Rays done today    71 y.o. male with the above concerns. No injuries. No bleeding, drainage, swelling, redness. It has started to hurt more. Some shoes make it hurt more.    ROS  Past Medical History:  Diagnosis Date   BPH (benign prostatic hyperplasia)    CAD S/P percutaneous coronary angioplasty 2018, 2020   anterior STEMI twice- LAD DES   GI bleeding 2013   Hyperlipidemia    Pulmonary embolism ([place]) 06/15/2013   after achilles tendon repair- Xarelto 6 months   Skin cancer (melanoma) [facility])     Past Surgical History:  Procedure Laterality Date   ACHILLES TENDON SURGERY Right 05/18/2013   BIOPSY  08/26/2022   Procedure: BIOPSY;  Surgeon: Doran Stabler, MD;  Location: [place];  Service: Gastroenterology;;   CARDIAC CATHETERIZATION     CORONARY STENT INTERVENTION N/A 02/11/2017   Procedure: Coronary Stent Intervention;  Surgeon: Lorretta Harp, MD;  Location: [place] CV LAB;  Service: Cardiovascular;  Laterality: N/A;   CORONARY/GRAFT ACUTE MI REVASCULARIZATION N/A 06/28/2019   Procedure: Coronary/Graft Acute MI Revascularization;  Surgeon: Lorretta Harp, MD;  Location: [place] [facility];  Service: Cardiovascular;  Laterality: N/A;   ESOPHAGOGASTRODUODENOSCOPY  09/24/2012   Procedure: ESOPHAGOGASTRODUODENOSCOPY (EGD);  Surgeon: Lear Ng, MD;  Location: [facility] ENDOSCOPY;  Service: Endoscopy;  Laterality: N/A;   ESOPHAGOGASTRODUODENOSCOPY (EGD) WITH PROPOFOL N/A 08/26/2022   Procedure: ESOPHAGOGASTRODUODENOSCOPY (EGD) WITH PROPOFOL;  Surgeon: Doran Stabler, MD;  Location: [place];  Service: Gastroenterology;  Laterality: N/A;   HAND SURGERY     LEFT HEART  CATH AND CORONARY ANGIOGRAPHY N/A 02/11/2017   Procedure: Left Heart Cath and Coronary Angiography;  Surgeon: Lorretta Harp, MD;  Location: [place] CV LAB;  Service: Cardiovascular;  Laterality: N/A;   LEFT HEART CATH AND CORONARY ANGIOGRAPHY N/A 06/28/2019   Procedure: LEFT HEART CATH AND CORONARY ANGIOGRAPHY;  Surgeon: Lorretta Harp, MD;  Location: [place] CV LAB;  Service: Cardiovascular;  Laterality: N/A;   NOSE SURGERY  08/11/2019   removal of melenoma      Current Outpatient Medications:    atorvastatin (LIPITOR) 80 MG tablet, TAKE 1 TABLET BY MOUTH EVERY DAY AT 6 PM (Patient taking differently: Take 80 mg by mouth daily.), Disp: 90 tablet, Rfl: 3   carvedilol (COREG) 6.25 MG tablet, Take 1 tablet (6.25 mg total) by mouth 2 (two) times daily with a meal. Schedule an appointment for further refills, Disp: 60 tablet, Rfl: 0   ciclopirox (LOPROX) 0.77 % cream, Apply topically 2 (two) times daily., Disp: 90 g, Rfl: 1   clopidogrel (PLAVIX) 75 MG tablet, Take 1 tablet (75 mg total) by mouth daily. (Patient not taking: Reported on 09/10/2022), Disp: 90 tablet, Rfl: 1   ferrous sulfate 325 (65 FE) MG EC tablet, Take 1 tablet (325 mg total) by mouth 2 (two) times daily., Disp: 60 tablet, Rfl: 3   Multiple Vitamins-Minerals (CENTRUM SILVER 50+MEN PO), Take 1 tablet by mouth daily., Disp: , Rfl:    nitroGLYCERIN (NITROSTAT) 0.4 MG SL tablet, Place 1 tablet (0.4 mg total) under the tongue every  5 (five) minutes as needed for chest pain., Disp: 25 tablet, Rfl: 3   pantoprazole (PROTONIX) 40 MG tablet, Take 1 tablet (40 mg total) by mouth 2 (two) times daily before a meal., Disp: 60 tablet, Rfl: 1   sertraline (ZOLOFT) 50 MG tablet, Take 1 tablet (50 mg total) by mouth daily., Disp: 90 tablet, Rfl: 1  Allergies  Allergen Reactions   Crestor [Rosuvastatin Calcium] Other (See Comments)    Muscle aches   Lactose Intolerance (Gi) Diarrhea         Objective:  Physical Exam  General:  AAO x3, NAD  Dermatological: callus right sub 5, min dried blood  Vascular: Dorsalis Pedis artery and Posterior Tibial artery pedal pulses are 2/4 bilateral with immedate capillary fill time. Pedal hair growth present. No varicosities and no lower extremity edema present bilateral. There is no pain with calf compression, swelling, warmth, erythema.   Neruologic: Grossly intact via light touch bilateral. Vibratory intact via tuning fork bilateral. Protective threshold with Semmes Wienstein monofilament intact to all pedal sites bilateral. Patellar and Achilles deep tendon reflexes 2+ bilateral. No Babinski or clonus noted bilateral.   Musculoskeletal: No gross boney pedal deformities bilateral. No pain, crepitus, or limitation noted with foot and ankle range of motion bilateral. Muscular strength 5/5 in all groups tested bilateral.  Gait: Unassisted, Nonantalgic.       Assessment:  ***     Plan:  ***

## 2022-09-25 ENCOUNTER — Encounter: Payer: Self-pay | Admitting: Gastroenterology

## 2022-09-25 ENCOUNTER — Telehealth: Payer: Self-pay

## 2022-09-25 ENCOUNTER — Ambulatory Visit (INDEPENDENT_AMBULATORY_CARE_PROVIDER_SITE_OTHER): Payer: Medicare Other | Admitting: Gastroenterology

## 2022-09-25 VITALS — BP 110/60 | HR 67 | Ht 73.0 in | Wt 206.8 lb

## 2022-09-25 DIAGNOSIS — I2510 Atherosclerotic heart disease of native coronary artery without angina pectoris: Secondary | ICD-10-CM | POA: Diagnosis not present

## 2022-09-25 DIAGNOSIS — Z9861 Coronary angioplasty status: Secondary | ICD-10-CM | POA: Diagnosis not present

## 2022-09-25 DIAGNOSIS — Z7902 Long term (current) use of antithrombotics/antiplatelets: Secondary | ICD-10-CM

## 2022-09-25 DIAGNOSIS — R195 Other fecal abnormalities: Secondary | ICD-10-CM | POA: Diagnosis not present

## 2022-09-25 DIAGNOSIS — K254 Chronic or unspecified gastric ulcer with hemorrhage: Secondary | ICD-10-CM

## 2022-09-25 DIAGNOSIS — D62 Acute posthemorrhagic anemia: Secondary | ICD-10-CM | POA: Diagnosis not present

## 2022-09-25 MED ORDER — NA SULFATE-K SULFATE-MG SULF 17.5-3.13-1.6 GM/177ML PO SOLN: 1.0000 | Freq: Once | ORAL | 0 refills | Status: AC

## 2022-09-25 NOTE — Telephone Encounter (Signed)
[facility] Pre-operative Risk Assessment     Request for surgical clearance:     Endoscopy Procedure  What type of surgery is being performed?     EGD/Colon  When is this surgery scheduled?     10-08-2022  What type of clearance is required ?   Pharmacy  Are there any medications that need to be held prior to surgery and how long? Yes, Plavix 5 day hold   Practice name and name of physician performing surgery?      [facility]  What is your office phone and fax number?      Phone- [phone]  [phone]  Anesthesia type (None, local, MAC, general) ?       MAC

## 2022-09-25 NOTE — Progress Notes (Signed)
[facility] Progress Note:  History: MICHALE EMMERICH 09/25/2022  Referring provider: Janith Lima, MD  Reason for consult/chief complaint: Anemia (Still feels very tired, stools are dark but patient states that this started after taking the iron. /Patient would like to know results of CT Angio. Patient states that no one gave him the results. )   Subjective  HPI: Rich follows up after recent hospitalization for GI bleeding.  He bled from a aspirin induced prepyloric gastric ulcer identified on EGD that did not require endoscopic hemostasis.  Plavix was resumed 5 days after discharge, and he was on iron tablets and twice daily PPI. In addition, he had had a Cologuard shortly before that hospitalization and was in the process of being referred to [place] to discuss a colonoscopy. He was markedly anemic at the time of admission and required transfusion of PRBCs.  He was seen in cardiology follow-up 09/10/2022 by Dr. Gwenlyn Found, who summarized this patient's history of MI and coronary stents.  Dr. Gwenlyn Found summarizes recent admission for GI bleeding as well as the discontinuation of aspirin and makes no mention of objection to that plan. ______________________________  Denice Paradise was here with his wife today and says overall he is feeling well albeit somewhat tired.  He has had no further hematemesis or melena.  He is still taking iron and twice daily pantoprazole, is off aspirin and on Plavix as recommended. His appetite good and weight lately stable.  Rich also says that he never got a report from his admission CT angiogram and wanted to discuss some findings on that today.  ROS:  Review of Systems  Constitutional:  Negative for appetite change and unexpected weight change.  HENT:  Negative for mouth sores and voice change.   Eyes:  Negative for pain and redness.  Respiratory:  Negative for cough and shortness of breath.   Cardiovascular:  Negative for chest pain and  palpitations.  Genitourinary:  Negative for dysuria and hematuria.  Musculoskeletal:  Negative for arthralgias and myalgias.  Skin:  Negative for pallor and rash.  Neurological:  Negative for weakness and headaches.  Hematological:  Negative for adenopathy.     Past Medical History: Past Medical History:  Diagnosis Date   BPH (benign prostatic hyperplasia)    CAD S/P percutaneous coronary angioplasty 2018, 2020   anterior STEMI twice- LAD DES   GI bleeding 2013   Hyperlipidemia    Pulmonary embolism ([place]) 06/15/2013   after achilles tendon repair- Xarelto 6 months   Skin cancer (melanoma) [facility])      Past Surgical History: Past Surgical History:  Procedure Laterality Date   ACHILLES TENDON SURGERY Right 05/18/2013   BIOPSY  08/26/2022   Procedure: BIOPSY;  Surgeon: Doran Stabler, MD;  Location: [place];  Service: Gastroenterology;;   CARDIAC CATHETERIZATION     CORONARY STENT INTERVENTION N/A 02/11/2017   Procedure: Coronary Stent Intervention;  Surgeon: Lorretta Harp, MD;  Location: [place] CV LAB;  Service: Cardiovascular;  Laterality: N/A;   CORONARY/GRAFT ACUTE MI REVASCULARIZATION N/A 06/28/2019   Procedure: Coronary/Graft Acute MI Revascularization;  Surgeon: Lorretta Harp, MD;  Location: [place] CV LAB;  Service: Cardiovascular;  Laterality: N/A;   ESOPHAGOGASTRODUODENOSCOPY  09/24/2012   Procedure: ESOPHAGOGASTRODUODENOSCOPY (EGD);  Surgeon: Lear Ng, MD;  Location: [facility] ENDOSCOPY;  Service: Endoscopy;  Laterality: N/A;   ESOPHAGOGASTRODUODENOSCOPY (EGD) WITH PROPOFOL N/A 08/26/2022   Procedure: ESOPHAGOGASTRODUODENOSCOPY (EGD) WITH PROPOFOL;  Surgeon: Doran Stabler, MD;  Location:  [place] ENDOSCOPY;  Service: Gastroenterology;  Laterality: N/A;   HAND SURGERY     LEFT HEART CATH AND CORONARY ANGIOGRAPHY N/A 02/11/2017   Procedure: Left Heart Cath and Coronary Angiography;  Surgeon: Lorretta Harp, MD;  Location: [place] CV LAB;   Service: Cardiovascular;  Laterality: N/A;   LEFT HEART CATH AND CORONARY ANGIOGRAPHY N/A 06/28/2019   Procedure: LEFT HEART CATH AND CORONARY ANGIOGRAPHY;  Surgeon: Lorretta Harp, MD;  Location: [place] CV LAB;  Service: Cardiovascular;  Laterality: N/A;   NOSE SURGERY  08/11/2019   removal of melenoma      Family History: Family History  Problem Relation Age of Onset   Heart failure Father    Coronary artery disease Sister 59   Cancer Neg Hx    COPD Neg Hx    Stroke Neg Hx    Kidney disease Neg Hx    Hypertension Neg Hx    Hyperlipidemia Neg Hx    Heart disease Neg Hx    Hearing loss Neg Hx    Early death Neg Hx    Drug abuse Neg Hx    Diabetes Neg Hx    Depression Neg Hx     Social History: Social History   Socioeconomic History   Marital status: Married    Spouse name: Olin Hauser   Number of children: Not on file   Years of education: Not on file   Highest education level: Not on file  Occupational History   Occupation: retired  Tobacco Use   Smoking status: Never   Smokeless tobacco: Never  Vaping Use   Vaping Use: Never used  Substance and Sexual Activity   Alcohol use: Yes    Alcohol/week: 5.0 standard drinks of alcohol    Types: 5 Shots of liquor per week    Comment: occassional   Drug use: No   Sexual activity: Yes    Birth control/protection: None  Other Topics Concern   Not on file  Social History Narrative   Lives in 2 story home with wife   Children live nearby   Social Determinants of Health   Financial Resource Strain: Low Risk  (02/22/2022)   Overall Financial Resource Strain (CARDIA)    Difficulty of Paying Living Expenses: Not hard at all  Food Insecurity: No Food Insecurity (02/22/2022)   Hunger Vital Sign    Worried About Running Out of Food in the Last Year: Never true    Ran Out of Food in the Last Year: Never true  Transportation Needs: No Transportation Needs (02/22/2022)   PRAPARE - Hydrologist  (Medical): No    Lack of Transportation (Non-Medical): No  Physical Activity: Insufficiently Active (02/22/2022)   Exercise Vital Sign    Days of Exercise per Week: 3 days    Minutes of Exercise per Session: 30 min  Stress: No Stress Concern Present (02/22/2022)   [place]    Feeling of Stress : Not at all  Social Connections: [place] (02/22/2022)   Social Connection and Isolation Panel [NHANES]    Frequency of Communication with Friends and Family: More than three times a week    Frequency of Social Gatherings with Friends and Family: More than three times a week    Attends Religious Services: More than 4 times per year    Active Member of [employer] or Organizations: Yes    Attends Archivist Meetings: More than 4 times  per year    Marital Status: Married    Allergies: Allergies  Allergen Reactions   Crestor [Rosuvastatin Calcium] Other (See Comments)    Muscle aches   Lactose Intolerance (Gi) Diarrhea    Outpatient Meds: Current Outpatient Medications  Medication Sig Dispense Refill   atorvastatin (LIPITOR) 80 MG tablet TAKE 1 TABLET BY MOUTH EVERY DAY AT 6 PM (Patient taking differently: Take 80 mg by mouth daily.) 90 tablet 3   carvedilol (COREG) 6.25 MG tablet Take 1 tablet (6.25 mg total) by mouth 2 (two) times daily with a meal. Schedule an appointment for further refills 60 tablet 0   clopidogrel (PLAVIX) 75 MG tablet Take 1 tablet (75 mg total) by mouth daily. 90 tablet 1   ferrous sulfate 325 (65 FE) MG EC tablet Take 1 tablet (325 mg total) by mouth 2 (two) times daily. 60 tablet 3   Multiple Vitamins-Minerals (CENTRUM SILVER 50+MEN PO) Take 1 tablet by mouth daily.     Na Sulfate-K Sulfate-Mg Sulf 17.5-3.13-1.6 GM/177ML SOLN Take 1 kit by mouth once for 1 dose. 354 mL 0   nitroGLYCERIN (NITROSTAT) 0.4 MG SL tablet Place 1 tablet (0.4 mg total) under the tongue every 5 (five) minutes as  needed for chest pain. 25 tablet 3   pantoprazole (PROTONIX) 40 MG tablet Take 1 tablet (40 mg total) by mouth 2 (two) times daily before a meal. 60 tablet 1   sertraline (ZOLOFT) 50 MG tablet Take 1 tablet (50 mg total) by mouth daily. 90 tablet 1   No current facility-administered medications for this visit.      ___________________________________________________________________ Objective   Exam:  BP 110/60   Pulse 67   Ht 6' 1"  (1.854 m)   Wt 206 lb 12.8 oz (93.8 kg)   SpO2 94%   BMI 27.28 kg/m  Wt Readings from Last 3 Encounters:  09/25/22 206 lb 12.8 oz (93.8 kg)  09/10/22 205 lb (93 kg)  09/04/22 203 lb (92.1 kg)   His wife was present for the entire visit General: Well-appearing, pleasant and conversational, ambulatory gets on exam table without difficulty Eyes: sclera anicteric, no redness ENT: oral mucosa moist without lesions, no cervical or supraclavicular lymphadenopathy.   Anterior lower taurus palatinus CV: Regular without murmur, no JVD, no peripheral edema Resp: clear to auscultation bilaterally, normal RR and effort noted GI: soft, no tenderness, with active bowel sounds. No guarding or palpable organomegaly noted. Skin; warm and dry, no rash or jaundice noted Neuro: awake, alert and oriented x 3. Normal gross motor function and fluent speech  Labs:  Gastric pathology results:  A. STOMACH, ANTRUM AND BODY, BIOPSY:  - Gastric antral and oxyntic mucosa with features of mild reactive  gastropathy  - Negative for H. pylori on HE stain  - Negative for intestinal metaplasia or malignancy    Radiologic Studies:  CLINICAL DATA:  gi bleed   EXAM: CTA ABDOMEN AND PELVIS WITHOUT AND WITH CONTRAST   TECHNIQUE: Multidetector CT imaging of the abdomen and pelvis was performed using the standard protocol during bolus administration of intravenous contrast. Multiplanar reconstructed images and MIPs were obtained and reviewed to evaluate the vascular  anatomy.   RADIATION DOSE REDUCTION: This exam was performed according to the departmental dose-optimization program which includes automated exposure control, adjustment of the mA and/or kV according to patient size and/or use of iterative reconstruction technique.   CONTRAST:  150m OMNIPAQUE IOHEXOL 350 MG/ML SOLN   COMPARISON:  None   FINDINGS: VASCULAR  No active extravasation of contrast identified.   Aorta: Mild atherosclerotic plaque. Normal caliber aorta without aneurysm, dissection, vasculitis or significant stenosis.   Celiac: Patent without evidence of aneurysm, dissection, vasculitis or significant stenosis.   SMA: Patent without evidence of aneurysm, dissection, vasculitis or significant stenosis.   Renals: Both renal arteries are patent without evidence of aneurysm, dissection, vasculitis, fibromuscular dysplasia or significant stenosis.   IMA: Patent without evidence of aneurysm, dissection, vasculitis or significant stenosis.   Inflow: Mild patent without evidence of aneurysm, dissection, vasculitis or significant stenosis.   Proximal Outflow: Bilateral common femoral and visualized portions of the superficial and profunda femoral arteries are patent without evidence of aneurysm, dissection, vasculitis or significant stenosis.   Veins: The main portal, splenic, superior mesenteric veins are patent. Inferior vena cava is patent.   Review of the MIP images confirms the above findings.   NON-VASCULAR   Lower chest: Elevated right hemidiaphragm with atelectasis of the right lower lobe. No acute abnormality. Coronary artery calcification.   Hepatobiliary: No focal liver abnormality. No gallstones, gallbladder wall thickening, or pericholecystic fluid. No biliary dilatation.   Pancreas: Diffusely atrophic. No focal lesion. Otherwise normal pancreatic contour. No surrounding inflammatory changes. No main pancreatic ductal dilatation.   Spleen:  Normal in size without focal abnormality.  Splenule noted.   Adrenals/Urinary Tract:   No adrenal nodule bilaterally.   Bilateral kidneys enhance symmetrically. Couple fluid dense lesions within the left kidney likely represent simple renal cysts (6:42,60) likely represents a simple renal cyst. Simple renal cysts, in the absence of clinically indicated signs/symptoms, require no independent follow-up. Subcentimeter hypodensities are too small to characterize.   No hydronephrosis. No hydroureter.   The urinary bladder is unremarkable.   Fluid density lesion   Stomach/Bowel: Stomach is within normal limits. No evidence of bowel wall thickening or dilatation. Colonic diverticulosis. Second portion duodenal diverticula. Appendix appears normal.   Lymphatic: No lymphadenopathy.   Reproductive: Prostate is unremarkable.   Other: No intraperitoneal free fluid. No intraperitoneal free gas. No organized fluid collection.   Musculoskeletal:   Tiny fat containing left inguinal hernia. Small fat containing umbilical hernia.   No suspicious lytic or blastic osseous lesions. No acute displaced fracture. Multilevel degenerative changes of the spine. Mild to moderate bilateral hip degenerative changes, right greater than left.   IMPRESSION: VASCULAR   1. No CT evidence of active bleed. 2.  Aortic Atherosclerosis (ICD10-I70.0).   NON-VASCULAR   1. Colonic diverticulosis with no acute diverticulitis. 2. Tiny fat containing left inguinal hernia. Small fat containing bili cul hernia.     Electronically Signed   By: Iven Finn M.D.   On: 08/25/2022 20:04 __________________________     Latest Ref Rng & Units 09/04/2022   10:16 AM 08/27/2022    3:40 AM 08/26/2022    4:43 PM  CBC  WBC 4.0 - 10.5 K/uL 9.1  8.1    Hemoglobin 13.0 - 17.0 g/dL 12.9  10.1  11.6   Hematocrit 39.0 - 52.0 % 39.4  29.3  33.5   Platelets 150.0 - 400.0 K/uL 224.0  148          Assessment: Encounter Diagnoses  Name Primary?   Chronic gastric ulcer with hemorrhage Yes   Acute blood loss anemia    Long term (current) use of antithrombotics/antiplatelets    Positive colorectal cancer screening using Cologuard test     Upper GI bleed with severe acute blood loss anemia leading to hospitalization from an aspirin induced gastric  ulcer exacerbated by Plavix.  He looks and feels much better at this point, his hemoglobin improved significantly within a short period of time after hospital discharge, and I expect his ulcer is likely nearly healed by now.  We also reviewed the CT angiogram findings of expected aortic atherosclerosis and clinically insignificant tiny fat-containing left inguinal hernia.   Plan: PPI twice daily, at least until time of repeat upper endoscopy.  If ulcer is healed, we will likely be able to stop that unless he would need to resume aspirin at any point.  Stop iron tablets today  Upper endoscopy to assess ulcer healing and colonoscopy for positive Cologuard.  He was agreeable after discussion of the procedures and risks.  The benefits and risks of the planned procedure were described in detail with the patient or (when appropriate) their health care proxy.  Risks were outlined as including, but not limited to, bleeding, infection, perforation, adverse medication reaction leading to cardiac or pulmonary decompensation, pancreatitis (if ERCP).  The limitation of incomplete mucosal visualization was also discussed.  No guarantees or warranties were given.  Patient at increased risk for cardiopulmonary complications of procedure due to medical comorbidities.  Hold Plavix 5 days before procedures, will communicate with Dr. Gwenlyn Found to make sure there is no objection to that.  (The patient's last coronary intervention was over 3 years ago)    [name]  CC: Referring provider noted above And Quay Burow, MD

## 2022-09-25 NOTE — Patient Instructions (Signed)
_______________________________________________________  If you are age 73 or older, your body mass index should be between 23-30. Your Body mass index is 27.28 kg/m. If this is out of the aforementioned range listed, please consider follow up with your Primary Care Provider.  If you are age 18 or younger, your body mass index should be between 19-25. Your Body mass index is 27.28 kg/m. If this is out of the aformentioned range listed, please consider follow up with your Primary Care Provider.   ________________________________________________________  The [facility] providers would like to encourage you to use [facility] to communicate with providers for non-urgent requests or questions.  Due to long hold times on the telephone, sending your provider a message by [facility] may be a faster and more efficient way to get a response.  Please allow 48 business hours for a response.  Please remember that this is for non-urgent requests.  _______________________________________________________  Dennis Bast have been scheduled for an endoscopy and colonoscopy. Please follow the written instructions given to you at your visit today. Please pick up your prep supplies at the pharmacy within the next 1-3 days. If you use inhalers (even only as needed), please bring them with you on the day of your procedure.  You will be contacted by our office prior to your procedure for directions on holding your Plavix.  If you do not hear from our office 1 week prior to your scheduled procedure, please call [phone] to discuss.    Due to recent changes in healthcare laws, you may see the results of your imaging and laboratory studies on MyChart before your provider has had a chance to review them.  We understand that in some cases there may be results that are confusing or concerning to you. Not all laboratory results come back in the same time frame and the provider may be waiting for multiple results in order to interpret  others.  Please give [place] 48 hours in order for your provider to thoroughly review all the results before contacting the office for clarification of your results.   It was a pleasure to see you today!  Thank you for trusting me with your gastrointestinal care!

## 2022-09-26 DIAGNOSIS — Z23 Encounter for immunization: Secondary | ICD-10-CM | POA: Diagnosis not present

## 2022-09-27 NOTE — Telephone Encounter (Signed)
   Patient Name: John Mccarthy  DOB: 1951-02-28 MRN: [id number]  Primary Cardiologist: Quay Burow, MD  Chart reviewed as part of pre-operative protocol coverage. Pharmacy clearance only. John Mccarthy has a hx of CAD s/p 01/2017 anterior STEMI s/p DES to ostial LAD, recurrent anterior STEMI 06/2019 thrombotic occlusion of previously placed stent requiring DEX x 3 to LAD. GI bleed 08/2022 at which time Aspirin was discontinued and Plavix continued.    Given hx of ostial LAD stenting and ISR, will route to Dr. Gwenlyn Found for input on holding Plavix.    Loel Dubonnet, NP 09/27/2022, 3:53 PM

## 2022-09-30 ENCOUNTER — Other Ambulatory Visit: Payer: Self-pay | Admitting: Cardiovascular Disease

## 2022-09-30 NOTE — Telephone Encounter (Signed)
   Patient Name: John Mccarthy  DOB: [birth year] MRN: [id number]  Primary Cardiologist: Quay Burow, MD  Chart reviewed as part of pre-operative protocol coverage. Pre-op clearance already addressed by colleagues in earlier phone notes. To summarize recommendations:  Pharm clearance was requested for holding plavix for upcoming procedure.  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 If he needs to be off plavix per GI then he must be on ASA.  -Dr. Gwenlyn Found  Will route this bundled recommendation to requesting provider via Epic fax function and remove from pre-op pool. Please call with questions.  Elgie Collard, PA-C 09/30/2022, 2:08 PM

## 2022-10-02 NOTE — Telephone Encounter (Signed)
John Mccarthy,   When the patient begins holding his Plavix 5 days prior to the procedures, he must begin aspirin 81 mg once daily, including on the day of procedure.      We will give him further instructions regarding the plavix with his post-procedure instructions that day.  - H. Danis  ________________________  Dr. Gwenlyn Found,    Thank you for your input.  - H. Danis

## 2022-10-04 NOTE — Telephone Encounter (Signed)
Patient is returning your call.  

## 2022-10-04 NOTE — Telephone Encounter (Signed)
Patient was already informed to hold the Plavix and start the ASA 81 mg. No additional questions at this time.

## 2022-10-04 NOTE — Telephone Encounter (Signed)
Left message to return call 

## 2022-10-08 ENCOUNTER — Encounter: Payer: Self-pay | Admitting: Gastroenterology

## 2022-10-08 ENCOUNTER — Ambulatory Visit (AMBULATORY_SURGERY_CENTER): Payer: Medicare Other | Admitting: Gastroenterology

## 2022-10-08 VITALS — BP 103/45 | HR 64 | Temp 96.2°F | Resp 18 | Ht 73.0 in | Wt 206.0 lb

## 2022-10-08 DIAGNOSIS — Z1211 Encounter for screening for malignant neoplasm of colon: Secondary | ICD-10-CM | POA: Diagnosis not present

## 2022-10-08 DIAGNOSIS — K317 Polyp of stomach and duodenum: Secondary | ICD-10-CM

## 2022-10-08 DIAGNOSIS — K254 Chronic or unspecified gastric ulcer with hemorrhage: Secondary | ICD-10-CM | POA: Diagnosis not present

## 2022-10-08 DIAGNOSIS — K225 Diverticulum of esophagus, acquired: Secondary | ICD-10-CM

## 2022-10-08 DIAGNOSIS — D128 Benign neoplasm of rectum: Secondary | ICD-10-CM

## 2022-10-08 DIAGNOSIS — D122 Benign neoplasm of ascending colon: Secondary | ICD-10-CM | POA: Diagnosis not present

## 2022-10-08 DIAGNOSIS — R195 Other fecal abnormalities: Secondary | ICD-10-CM | POA: Diagnosis not present

## 2022-10-08 DIAGNOSIS — K6289 Other specified diseases of anus and rectum: Secondary | ICD-10-CM

## 2022-10-08 MED ORDER — SODIUM CHLORIDE 0.9 % IV SOLN: 500.0000 mL | Freq: Once | INTRAVENOUS | Status: DC

## 2022-10-08 NOTE — Progress Notes (Signed)
VS completed by DT.  Pt's states no medical or surgical changes since previsit or office visit.  

## 2022-10-08 NOTE — Patient Instructions (Signed)
Handouts provided on polyps and diverticulosis.   Resume previous diet.  Continue present medications. Resume Plavix (clopidogrel) at prior dose tomorrow.  Discontinue Pantoprazole (Protonix).  Await pathology results.  No repeat surveillance colonoscopy recommended (unless either polyp has advanced pathology findings).    YOU HAD AN ENDOSCOPIC PROCEDURE TODAY AT [facility]:   Refer to the procedure report that was given to you for any specific questions about what was found during the examination.  If the procedure report does not answer your questions, please call your gastroenterologist to clarify.  If you requested that your care partner not be given the details of your procedure findings, then the procedure report has been included in a sealed envelope for you to review at your convenience later.  YOU SHOULD EXPECT: Some feelings of bloating in the abdomen. Passage of more gas than usual.  Walking can help get rid of the air that was put into your GI tract during the procedure and reduce the bloating. If you had a lower endoscopy (such as a colonoscopy or flexible sigmoidoscopy) you may notice spotting of blood in your stool or on the toilet paper. If you underwent a bowel prep for your procedure, you may not have a normal bowel movement for a few days.  Please Note:  You might notice some irritation and congestion in your nose or some drainage.  This is from the oxygen used during your procedure.  There is no need for concern and it should clear up in a day or so.  SYMPTOMS TO REPORT IMMEDIATELY:  Following lower endoscopy (colonoscopy or flexible sigmoidoscopy):  Excessive amounts of blood in the stool  Significant tenderness or worsening of abdominal pains  Swelling of the abdomen that is new, acute  Fever of 100F or higher  Following upper endoscopy (EGD)  Vomiting of blood or coffee ground material  New chest pain or pain under the shoulder blades  Painful or  persistently difficult swallowing  New shortness of breath  Fever of 100F or higher  Black, tarry-looking stools  For urgent or emergent issues, a gastroenterologist can be reached at any hour by calling [phone]. Do not use MyChart messaging for urgent concerns.    DIET:  We do recommend a small meal at first, but then you may proceed to your regular diet.  Drink plenty of fluids but you should avoid alcoholic beverages for 24 hours.  ACTIVITY:  You should plan to take it easy for the rest of today and you should NOT DRIVE or use heavy machinery until tomorrow (because of the sedation medicines used during the test).    FOLLOW UP: Our staff will call the number listed on your records the next business day following your procedure.  We will call around 7:15- 8:00 am to check on you and address any questions or concerns that you may have regarding the information given to you following your procedure. If we do not reach you, we will leave a message.     If any biopsies were taken you will be contacted by phone or by letter within the next 1-3 weeks.  Please call us at [phone] if you have not heard about the biopsies in 3 weeks.    SIGNATURES/CONFIDENTIALITY: You and/or your care partner have signed paperwork which will be entered into your electronic medical record.  These signatures attest to the fact that that the information above on your After Visit Summary has been reviewed and is understood.  Full responsibility of the confidentiality of this discharge information lies with you and/or your care-partner.

## 2022-10-08 NOTE — Progress Notes (Signed)
To pacu, VSS. Report to [email or web address] 

## 2022-10-08 NOTE — Progress Notes (Signed)
No changes to clinical history since GI office visit on 09/25/22.  The patient is appropriate for an endoscopic procedure in the ambulatory setting.  - Wilfrid Lund, MD

## 2022-10-08 NOTE — Op Note (Signed)
[facility] Patient Name: John Mccarthy Procedure Date: 10/08/2022 9:49 AM MRN: [id number] Endoscopist: Mallie Mussel L. Loletha Carrow , MD, [id number] Age: 71 Referring MD:  Date of Birth: 1951/05/04 Gender: Male Account #: [id number] Procedure:                Colonoscopy Indications:              Positive Cologuard test Medicines:                Monitored Anesthesia Care Procedure:                Pre-Anesthesia Assessment:                           - Prior to the procedure, a History and Physical                            was performed, and patient medications and                            allergies were reviewed. The patient's tolerance of                            previous anesthesia was also reviewed. The risks                            and benefits of the procedure and the sedation                            options and risks were discussed with the patient.                            All questions were answered, and informed consent                            was obtained. Prior Anticoagulants: The patient has                            taken Plavix (clopidogrel), last dose was 5 days                            prior to procedure. ASA Grade Assessment: III - A                            patient with severe systemic disease. After                            reviewing the risks and benefits, the patient was                            deemed in satisfactory condition to undergo the                            procedure.  After obtaining informed consent, the colonoscope                            was passed under direct vision. Throughout the                            procedure, the patient's blood pressure, pulse, and                            oxygen saturations were monitored continuously. The                            Olympus CF-HQ190L [id number]) Colonoscope was                            introduced through the anus and advanced to the the                             cecum, identified by appendiceal orifice and                            ileocecal valve. The colonoscopy was performed                            without difficulty. The patient tolerated the                            procedure well. The quality of the bowel                            preparation was good. The ileocecal valve,                            appendiceal orifice, and rectum were photographed. Scope In: 9:57:36 AM Scope Out: 10:14:09 AM Scope Withdrawal Time: 0 hours 12 minutes 31 seconds  Total Procedure Duration: 0 hours 16 minutes 33 seconds  Findings:                 The perianal and digital rectal examinations were                            normal.                           An 8 mm polyp was found in the ascending colon. The                            polyp was sessile. The polyp was removed with a                            cold snare. Resection and retrieval were complete.                           A 6 mm polyp was found in the rectum. The polyp was  semi-pedunculated. The polyp was removed with a hot                            snare. Resection and retrieval were complete.                           Repeat examination of right colon under NBI                            performed.                           Multiple diverticula were found in the left colon.                           Anal papilla(e) were hypertrophied.                           The exam was otherwise without abnormality on                            direct and retroflexion views. Complications:            No immediate complications. Estimated Blood Loss:     Estimated blood loss was minimal. Impression:               - One 8 mm polyp in the ascending colon, removed                            with a cold snare. Resected and retrieved.                           - One 6 mm polyp in the rectum, removed with a hot                            snare. Resected and  retrieved.                           - Diverticulosis in the left colon.                           - Anal papilla(e) were hypertrophied.                           - The examination was otherwise normal on direct                            and retroflexion views. Recommendation:           - Patient has a contact number available for                            emergencies. The signs and symptoms of potential                            delayed complications were discussed with the  patient. Return to normal activities tomorrow.                            Written discharge instructions were provided to the                            patient.                           - Resume previous diet.                           - Continue present medications.                           - Await pathology results.                           - No repeat surveillance colonoscopy recommended                            (unless either polyp has advanced pathology                            findings).                           - Resume Plavix (clopidogrel) at prior dose                            tomorrow.                           - See the other procedure note for documentation of                            additional recommendations. Corbitt Cloke L. Loletha Carrow, MD 10/08/2022 10:30:24 AM This report has been signed electronically.

## 2022-10-08 NOTE — Progress Notes (Signed)
Called to room to assist during endoscopic procedure.  Patient ID and intended procedure confirmed with present staff. Received instructions for my participation in the procedure from the performing physician.  

## 2022-10-08 NOTE — Op Note (Signed)
[place] Patient Name: John Mccarthy Procedure Date: 10/08/2022 9:48 AM MRN: [id number] Endoscopist: Mallie Mussel L. 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 The patient's tolerance of                            previous anesthesia was also reviewed. The risks                            and benefits of the procedure and the sedation                            options and risks were discussed with the patient.                            All questions were answered, and informed consent                            was obtained. Prior Anticoagulants: The patient has                            taken Plavix (clopidogrel), last dose was 5 days                            prior to procedure. ASA Grade Assessment: III - A                            patient with severe systemic disease. After                            reviewing the risks and benefits, the patient was                            deemed in satisfactory condition to undergo the                            procedure.  After obtaining informed consent, the endoscope was                            passed under direct vision. Throughout the                            procedure, the patient's blood pressure, pulse, and                            oxygen saturations were monitored continuously. The                            Endoscope was introduced through the mouth, and                            advanced to the second  part of duodenum. The upper                            GI endoscopy was accomplished without difficulty.                            The patient tolerated the procedure well. Scope In: Scope Out: Findings:                 The larynx was normal.                           The esophagus was normal.                           A few small sessile fundic gland polyps were found                            in the gastric body.                           There is no endoscopic evidence of ulceration in                            the entire examined stomach.                           The cardia and gastric fundus were normal on                            retroflexion.                           A diminutive diverticulum was found in the second                            portion of the duodenum.                           The exam of the duodenum was otherwise normal. Complications:  No immediate complications. Estimated Blood Loss:     Estimated blood loss: none. Impression:               - Normal larynx.                           - Normal esophagus.                           - A few fundic gland polyps.                           - Duodenal diverticulum.                           - No specimens collected. Recommendation:           - Patient has a contact number available for                            emergencies. The signs and symptoms of potential                            delayed complications were discussed with the                            patient. Return to normal activities tomorrow.                            Written discharge instructions were provided to the                            patient.                           - Resume previous diet.                           - Resume Plavix (clopidogrel) at prior dose                            tomorrow.                           - Discontinue pantoprazole John Meuser L. Loletha Carrow, MD 10/08/2022 10:34:25 AM This report has been signed  electronically.

## 2022-10-09 ENCOUNTER — Telehealth: Payer: Self-pay

## 2022-10-09 NOTE — Telephone Encounter (Signed)
Follow up call placed, RN received a message which stated "call cannot be completed".

## 2022-10-10 ENCOUNTER — Encounter: Payer: Self-pay | Admitting: Gastroenterology

## 2022-10-22 ENCOUNTER — Other Ambulatory Visit: Payer: Self-pay | Admitting: Internal Medicine

## 2022-10-23 ENCOUNTER — Other Ambulatory Visit: Payer: Self-pay | Admitting: Internal Medicine

## 2022-10-26 ENCOUNTER — Other Ambulatory Visit: Payer: Self-pay | Admitting: Cardiovascular Disease

## 2022-11-26 DIAGNOSIS — M79642 Pain in left hand: Secondary | ICD-10-CM | POA: Diagnosis not present

## 2022-12-05 ENCOUNTER — Ambulatory Visit: Payer: Medicare Other | Admitting: Internal Medicine

## 2022-12-10 DIAGNOSIS — S62345A Nondisplaced fracture of base of fourth metacarpal bone, left hand, initial encounter for closed fracture: Secondary | ICD-10-CM | POA: Diagnosis not present

## 2022-12-18 ENCOUNTER — Other Ambulatory Visit: Payer: Self-pay | Admitting: Cardiovascular Disease

## 2022-12-23 ENCOUNTER — Encounter: Payer: Self-pay | Admitting: Internal Medicine

## 2022-12-23 ENCOUNTER — Ambulatory Visit (INDEPENDENT_AMBULATORY_CARE_PROVIDER_SITE_OTHER): Payer: Medicare Other

## 2022-12-23 ENCOUNTER — Ambulatory Visit (INDEPENDENT_AMBULATORY_CARE_PROVIDER_SITE_OTHER): Payer: Medicare Other | Admitting: Internal Medicine

## 2022-12-23 VITALS — BP 114/62 | HR 66 | Temp 98.2°F | Ht 73.0 in | Wt 211.0 lb

## 2022-12-23 DIAGNOSIS — W19XXXA Unspecified fall, initial encounter: Secondary | ICD-10-CM

## 2022-12-23 DIAGNOSIS — R0789 Other chest pain: Secondary | ICD-10-CM

## 2022-12-23 DIAGNOSIS — S2242XA Multiple fractures of ribs, left side, initial encounter for closed fracture: Secondary | ICD-10-CM

## 2022-12-23 DIAGNOSIS — D539 Nutritional anemia, unspecified: Secondary | ICD-10-CM

## 2022-12-23 LAB — CBC WITH DIFFERENTIAL/PLATELET
Basophils Absolute: 0.1 10*3/uL (ref 0.0–0.1)
Basophils Relative: 0.7 % (ref 0.0–3.0)
Eosinophils Absolute: 0.2 10*3/uL (ref 0.0–0.7)
Eosinophils Relative: 1.6 % (ref 0.0–5.0)
HCT: 43 % (ref 39.0–52.0)
Hemoglobin: 14.2 g/dL (ref 13.0–17.0)
Lymphocytes Relative: 20.9 % (ref 12.0–46.0)
Lymphs Abs: 2.2 10*3/uL (ref 0.7–4.0)
MCHC: 33.1 g/dL (ref 30.0–36.0)
MCV: 86.6 fl (ref 78.0–100.0)
Monocytes Absolute: 1 10*3/uL (ref 0.1–1.0)
Monocytes Relative: 9.7 % (ref 3.0–12.0)
Neutro Abs: 7.1 10*3/uL (ref 1.4–7.7)
Neutrophils Relative %: 67.1 % (ref 43.0–77.0)
Platelets: 208 10*3/uL (ref 150.0–400.0)
RBC: 4.96 Mil/uL (ref 4.22–5.81)
RDW: 15.2 % (ref 11.5–15.5)
WBC: 10.6 10*3/uL — ABNORMAL HIGH (ref 4.0–10.5)

## 2022-12-23 LAB — IBC + FERRITIN
Ferritin: 66.6 ng/mL (ref 22.0–322.0)
Iron: 62 ug/dL (ref 42–165)
Saturation Ratios: 20.1 % (ref 20.0–50.0)
TIBC: 308 ug/dL (ref 250.0–450.0)
Transferrin: 220 mg/dL (ref 212.0–360.0)

## 2022-12-23 LAB — VITAMIN B12: Vitamin B-12: 390 pg/mL (ref 211–911)

## 2022-12-23 LAB — FOLATE: Folate: 11.8 ng/mL (ref >5.9)

## 2022-12-23 NOTE — Progress Notes (Signed)
Subjective:  Patient ID: John Mccarthy, male    DOB: 02/15/1951  Age: 72 y.o. MRN: [id number]  CC: No chief complaint on file.   HPI John Mccarthy presents for f/up -  He fell one month ago and injured his left lateral chest wall and continues to have pain in the area.  Outpatient Medications Prior to Visit  Medication Sig Dispense Refill   atorvastatin (LIPITOR) 80 MG tablet TAKE 1 TABLET BY MOUTH EVERY DAY AT 6 PM 90 tablet 3   carvedilol (COREG) 6.25 MG tablet TAKE 1 TABLET BY MOUTH 2 TIMES DAILY WITH A MEAL. SCHEDULE AN APPOINTMENT FOR FURTHER REFILLS 180 tablet 1   clopidogrel (PLAVIX) 75 MG tablet TAKE 1 TABLET BY MOUTH EVERY DAY 90 tablet 1   ferrous sulfate 325 (65 FE) MG EC tablet Take 1 tablet (325 mg total) by mouth 2 (two) times daily. 60 tablet 3   Multiple Vitamins-Minerals (CENTRUM SILVER 50+MEN PO) Take 1 tablet by mouth daily.     pantoprazole (PROTONIX) 40 MG tablet TAKE 1 TABLET BY MOUTH TWICE A DAY BEFORE MEALS 180 tablet 1   sertraline (ZOLOFT) 50 MG tablet Take 1 tablet (50 mg total) by mouth daily. 90 tablet 1   nitroGLYCERIN (NITROSTAT) 0.4 MG SL tablet Place 1 tablet (0.4 mg total) under the tongue every 5 (five) minutes as needed for chest pain. 25 tablet 3   No facility-administered medications prior to visit.    ROS Review of Systems  Constitutional: Negative.  Negative for chills, diaphoresis, fatigue, fever and unexpected weight change.  HENT: Negative.    Eyes: Negative.   Respiratory:  Negative for cough, chest tightness, shortness of breath and wheezing.   Cardiovascular:  Positive for chest pain. Negative for palpitations and leg swelling.  Gastrointestinal:  Negative for abdominal pain, constipation, diarrhea, nausea and vomiting.  Genitourinary:  Negative for difficulty urinating, flank pain, hematuria and urgency.  Musculoskeletal: Negative.   Skin: Negative.   Neurological: Negative.  Negative for dizziness and weakness.   Hematological:  Negative for adenopathy. Does not bruise/bleed easily.  Psychiatric/Behavioral: Negative.      Objective:  BP 114/62 (BP Location: Left Arm, Patient Position: Sitting, Cuff Size: Large)   Pulse 66   Temp 98.2 F (36.8 C) (Oral)   Ht '6\' 1"'$  (1.854 m)   Wt 211 lb (95.7 kg)   SpO2 93%   BMI 27.84 kg/m   BP Readings from Last 3 Encounters:  12/23/22 114/62  10/08/22 (!) 103/45  09/25/22 110/60    Wt Readings from Last 3 Encounters:  12/23/22 211 lb (95.7 kg)  10/08/22 206 lb (93.4 kg)  09/25/22 206 lb 12.8 oz (93.8 kg)    Physical Exam Vitals reviewed.  HENT:     Nose: Nose normal.     Mouth/Throat:     Mouth: Mucous membranes are moist.  Eyes:     General: No scleral icterus.    Conjunctiva/sclera: Conjunctivae normal.  Cardiovascular:     Rate and Rhythm: Normal rate and regular rhythm.     Heart sounds: No murmur heard. Pulmonary:     Effort: Pulmonary effort is normal.     Breath sounds: No stridor. No wheezing, rhonchi or rales.  Chest:     Chest wall: Tenderness present. No mass, swelling or crepitus.  Breasts:    Right: Normal.     Left: Normal.    Abdominal:     General: Abdomen is flat.     Palpations:  There is no mass.     Tenderness: There is no abdominal tenderness. There is no guarding.     Hernia: No hernia is present.  Musculoskeletal:        General: Normal range of motion.     Cervical back: Neck supple.     Right lower leg: No edema.     Left lower leg: No edema.  Lymphadenopathy:     Cervical: No cervical adenopathy.     Upper Body:     Right upper body: No supraclavicular adenopathy.     Left upper body: No supraclavicular adenopathy.  Skin:    General: Skin is warm and dry.     Coloration: Skin is not pale.  Neurological:     General: No focal deficit present.     Mental Status: He is alert. Mental status is at baseline.  Psychiatric:        Mood and Affect: Mood normal.        Behavior: Behavior normal.      Lab Results  Component Value Date   WBC 10.6 (H) 12/23/2022   HGB 14.2 12/23/2022   HCT 43.0 12/23/2022   PLT 208.0 12/23/2022   GLUCOSE 106 (H) 08/27/2022   CHOL 130 06/20/2022   TRIG 153.0 (H) 06/20/2022   HDL 37.50 (L) 06/20/2022   LDLDIRECT 146.0 10/15/2016   LDLCALC 61 06/20/2022   ALT 16 08/25/2022   AST 22 08/25/2022   NA 138 08/27/2022   K 3.5 08/27/2022   CL 109 08/27/2022   CREATININE 0.98 08/27/2022   BUN 16 08/27/2022   CO2 23 08/27/2022   TSH 4.29 06/20/2022   PSA 1.10 06/20/2022   INR 1.1 08/25/2022   HGBA1C 5.5 10/15/2016    CT ANGIO GI BLEED  Result Date: 08/25/2022 CLINICAL DATA:  gi bleed EXAM: CTA ABDOMEN AND PELVIS WITHOUT AND WITH CONTRAST TECHNIQUE: Multidetector CT imaging of the abdomen and pelvis was performed using the standard protocol during bolus administration of intravenous contrast. Multiplanar reconstructed images and MIPs were obtained and reviewed to evaluate the vascular anatomy. RADIATION DOSE REDUCTION: This exam was performed according to the departmental dose-optimization program which includes automated exposure control, adjustment of the mA and/or kV according to patient size and/or use of iterative reconstruction technique. CONTRAST:  185m OMNIPAQUE IOHEXOL 350 MG/ML SOLN COMPARISON:  None FINDINGS: VASCULAR No active extravasation of contrast identified. Aorta: Mild atherosclerotic plaque. Normal caliber aorta without aneurysm, dissection, vasculitis or significant stenosis. Celiac: Patent without evidence of aneurysm, dissection, vasculitis or significant stenosis. SMA: Patent without evidence of aneurysm, dissection, vasculitis or significant stenosis. Renals: Both renal arteries are patent without evidence of aneurysm, dissection, vasculitis, fibromuscular dysplasia or significant stenosis. IMA: Patent without evidence of aneurysm, dissection, vasculitis or significant stenosis. Inflow: Mild patent without evidence of aneurysm,  dissection, vasculitis or significant stenosis. Proximal Outflow: Bilateral common femoral and visualized portions of the superficial and profunda femoral arteries are patent without evidence of aneurysm, dissection, vasculitis or significant stenosis. Veins: The main portal, splenic, superior mesenteric veins are patent. Inferior vena cava is patent. Review of the MIP images confirms the above findings. NON-VASCULAR Lower chest: Elevated right hemidiaphragm with atelectasis of the right lower lobe. No acute abnormality. Coronary artery calcification. Hepatobiliary: No focal liver abnormality. No gallstones, gallbladder wall thickening, or pericholecystic fluid. No biliary dilatation. Pancreas: Diffusely atrophic. No focal lesion. Otherwise normal pancreatic contour. No surrounding inflammatory changes. No main pancreatic ductal dilatation. Spleen: Normal in size without focal abnormality.  Splenule  noted. Adrenals/Urinary Tract: No adrenal nodule bilaterally. Bilateral kidneys enhance symmetrically. Couple fluid dense lesions within the left kidney likely represent simple renal cysts (6:42,60) likely represents a simple renal cyst. Simple renal cysts, in the absence of clinically indicated signs/symptoms, require no independent follow-up. Subcentimeter hypodensities are too small to characterize. No hydronephrosis. No hydroureter. The urinary bladder is unremarkable. Fluid density lesion Stomach/Bowel: Stomach is within normal limits. No evidence of bowel wall thickening or dilatation. Colonic diverticulosis. Second portion duodenal diverticula. Appendix appears normal. Lymphatic: No lymphadenopathy. Reproductive: Prostate is unremarkable. Other: No intraperitoneal free fluid. No intraperitoneal free gas. No organized fluid collection. Musculoskeletal: Tiny fat containing left inguinal hernia. Small fat containing umbilical hernia. No suspicious lytic or blastic osseous lesions. No acute displaced fracture.  Multilevel degenerative changes of the spine. Mild to moderate bilateral hip degenerative changes, right greater than left. IMPRESSION: VASCULAR 1. No CT evidence of active bleed. 2.  Aortic Atherosclerosis (ICD10-I70.0). NON-VASCULAR 1. Colonic diverticulosis with no acute diverticulitis. 2. Tiny fat containing left inguinal hernia. Small fat containing bili cul hernia. Electronically Signed   By: Iven Finn M.D.   On: 08/25/2022 20:04    DG Chest 2 View  Result Date: 12/23/2022 CLINICAL DATA:  Left-sided chest wall pain after a fall 1 month ago. EXAM: CHEST - 2 VIEW COMPARISON:  06/28/2019. FINDINGS: Trachea is midline. Heart size stable. Lungs are low in volume with an elevated right hemidiaphragm. No airspace consolidation or pleural fluid. As suggested on today's dedicated rib series, there may be old left rib fractures. No definite acute fracture. IMPRESSION: No acute findings. Electronically Signed   By: Lorin Picket M.D.   On: 12/23/2022 08:52   DG Ribs Unilateral Left  Result Date: 12/23/2022 CLINICAL DATA:  Fall 1 month ago, left chest wall pain. EXAM: LEFT RIBS - 2 VIEW COMPARISON:  Same day chest radiograph and CT chest 07/22/2017. FINDINGS: There appear to be healed left rib fractures. No definite acute displaced rib fracture. Degenerative changes in the left shoulder. IMPRESSION: No definite acute rib fracture. Electronically Signed   By: Lorin Picket M.D.   On: 12/23/2022 08:48     Assessment & Plan:   Diagnoses and all orders for this visit:  Deficiency anemia- His H/H are normal now. -     IBC + Ferritin; Future -     Vitamin B12; Future -     CBC with Differential/Platelet; Future -     Folate; Future -     Folate -     CBC with Differential/Platelet -     Vitamin B12 -     IBC + Ferritin  Left-sided chest wall pain -     DG Ribs Unilateral Left; Future -     DG Chest 2 View; Future  Fall, initial encounter -     DG Ribs Unilateral Left; Future -     DG  Chest 2 View; Future  Closed fracture of multiple ribs of left side, initial encounter- He does not need anything for pain.   I am having John Mccarthy maintain his Multiple Vitamins-Minerals (CENTRUM SILVER 50+MEN PO), nitroGLYCERIN, sertraline, ferrous sulfate, carvedilol, pantoprazole, clopidogrel, and atorvastatin.  No orders of the defined types were placed in this encounter.    Follow-up: No follow-ups on file.  Scarlette Calico, MD

## 2023-02-21 ENCOUNTER — Other Ambulatory Visit: Payer: Self-pay | Admitting: Internal Medicine

## 2023-02-21 DIAGNOSIS — F3342 Major depressive disorder, recurrent, in full remission: Secondary | ICD-10-CM

## 2023-02-24 ENCOUNTER — Telehealth: Payer: Self-pay

## 2023-02-24 ENCOUNTER — Other Ambulatory Visit: Payer: Self-pay | Admitting: Internal Medicine

## 2023-02-24 ENCOUNTER — Ambulatory Visit (INDEPENDENT_AMBULATORY_CARE_PROVIDER_SITE_OTHER): Payer: Medicare Other

## 2023-02-24 VITALS — Ht 73.0 in | Wt 200.0 lb

## 2023-02-24 DIAGNOSIS — I255 Ischemic cardiomyopathy: Secondary | ICD-10-CM

## 2023-02-24 DIAGNOSIS — Z0000 Encounter for general adult medical examination without abnormal findings: Secondary | ICD-10-CM | POA: Diagnosis not present

## 2023-02-24 DIAGNOSIS — H903 Sensorineural hearing loss, bilateral: Secondary | ICD-10-CM

## 2023-02-24 NOTE — Progress Notes (Signed)
I connected with  John Mccarthy on 02/24/23 by a audio enabled telemedicine application and verified that I am speaking with the correct person using two identifiers.  Patient Location: Home  Provider Location: Office/Clinic  I discussed the limitations of evaluation and management by telemedicine. The patient expressed understanding and agreed to proceed.  Patient Medicare AWV questionnaire was completed by the patient on 02/20/2023; I have confirmed that all information answered by patient is correct and no changes since this date.     Subjective:   John Mccarthy is a 72 y.o. male who presents for Medicare Annual/Subsequent preventive examination.  Review of Systems     Cardiac Risk Factors include: advanced age (>79men, >63 women);dyslipidemia;family history of premature cardiovascular disease;male gender     Objective:    Today's Vitals   02/24/23 1134 02/24/23 1143  Weight: 200 lb (90.7 kg)   Height: 6\' 1"  (1.854 m)   PainSc: 0-No pain 0-No pain   Body mass index is 26.39 kg/m.     02/24/2023   11:35 AM 08/27/2022    2:00 PM 08/25/2022    4:56 PM 02/22/2022    1:42 PM 02/07/2021    8:27 AM 03/14/2020   10:13 AM 06/29/2019   12:18 AM  Advanced Directives  Does Patient Have a Medical Advance Directive? No No No No No No No  Would patient like information on creating a medical advance directive? No - Patient declined   No - Patient declined No - Patient declined Yes (MAU/Ambulatory/Procedural Areas - Information given) Yes (Inpatient - patient defers creating a medical advance directive and declines information at this time)    Current Medications (verified) Outpatient Encounter Medications as of 02/24/2023  Medication Sig   atorvastatin (LIPITOR) 80 MG tablet TAKE 1 TABLET BY MOUTH EVERY DAY AT 6 PM   carvedilol (COREG) 6.25 MG tablet TAKE 1 TABLET BY MOUTH 2 TIMES DAILY WITH A MEAL. SCHEDULE AN APPOINTMENT FOR FURTHER REFILLS   clopidogrel (PLAVIX) 75 MG  tablet TAKE 1 TABLET BY MOUTH EVERY DAY   Multiple Vitamins-Minerals (CENTRUM SILVER 50+MEN PO) Take 1 tablet by mouth daily.   sertraline (ZOLOFT) 50 MG tablet TAKE 1 TABLET BY MOUTH EVERY DAY   nitroGLYCERIN (NITROSTAT) 0.4 MG SL tablet Place 1 tablet (0.4 mg total) under the tongue every 5 (five) minutes as needed for chest pain.   [DISCONTINUED] ferrous sulfate 325 (65 FE) MG EC tablet Take 1 tablet (325 mg total) by mouth 2 (two) times daily.   [DISCONTINUED] pantoprazole (PROTONIX) 40 MG tablet TAKE 1 TABLET BY MOUTH TWICE A DAY BEFORE MEALS   No facility-administered encounter medications on file as of 02/24/2023.    Allergies (verified) Crestor [rosuvastatin calcium] and Lactose intolerance (gi)   History: Past Medical History:  Diagnosis Date   BPH (benign prostatic hyperplasia)    CAD S/P percutaneous coronary angioplasty 2018, 2020   anterior STEMI twice- LAD DES   GI bleeding 2013   Hyperlipidemia    Pulmonary embolism ([place]) 06/15/2013   after achilles tendon repair- Xarelto 6 months   Skin cancer (melanoma) [facility])    Past Surgical History:  Procedure Laterality Date   ACHILLES TENDON SURGERY Right 05/18/2013   BIOPSY  08/26/2022   Procedure: BIOPSY;  Surgeon: Doran Stabler, MD;  Location: [place];  Service: Gastroenterology;;   CARDIAC CATHETERIZATION     CORONARY STENT INTERVENTION N/A 02/11/2017   Procedure: Coronary Stent Intervention;  Surgeon: Lorretta Harp, MD;  Location: [facility]  INVASIVE CV LAB;  Service: Cardiovascular;  Laterality: N/A;   CORONARY/GRAFT ACUTE MI REVASCULARIZATION N/A 06/28/2019   Procedure: Coronary/Graft Acute MI Revascularization;  Surgeon: Lorretta Harp, MD;  Location: [place] CV LAB;  Service: Cardiovascular;  Laterality: N/A;   ESOPHAGOGASTRODUODENOSCOPY  09/24/2012   Procedure: ESOPHAGOGASTRODUODENOSCOPY (EGD);  Surgeon: Lear Ng, MD;  Location: [facility] ENDOSCOPY;  Service: Endoscopy;  Laterality: N/A;    ESOPHAGOGASTRODUODENOSCOPY (EGD) WITH PROPOFOL N/A 08/26/2022   Procedure: ESOPHAGOGASTRODUODENOSCOPY (EGD) WITH PROPOFOL;  Surgeon: Doran Stabler, MD;  Location: [place];  Service: Gastroenterology;  Laterality: N/A;   HAND SURGERY     LEFT HEART CATH AND CORONARY ANGIOGRAPHY N/A 02/11/2017   Procedure: Left Heart Cath and Coronary Angiography;  Surgeon: Lorretta Harp, MD;  Location: [place] CV LAB;  Service: Cardiovascular;  Laterality: N/A;   LEFT HEART CATH AND CORONARY ANGIOGRAPHY N/A 06/28/2019   Procedure: LEFT HEART CATH AND CORONARY ANGIOGRAPHY;  Surgeon: Lorretta Harp, MD;  Location: [place] CV LAB;  Service: Cardiovascular;  Laterality: N/A;   NOSE SURGERY  08/11/2019   removal of melenoma    Family History  Problem Relation Age of Onset   Heart failure Father    Coronary artery disease Sister 79   Cancer Neg Hx    COPD Neg Hx    Stroke Neg Hx    Kidney disease Neg Hx    Hypertension Neg Hx    Hyperlipidemia Neg Hx    Heart disease Neg Hx    Hearing loss Neg Hx    Early death Neg Hx    Drug abuse Neg Hx    Diabetes Neg Hx    Depression Neg Hx    Social History   Socioeconomic History   Marital status: Married    Spouse name: Olin Hauser   Number of children: Not on file   Years of education: Not on file   Highest education level: Not on file  Occupational History   Occupation: retired  Tobacco Use   Smoking status: Never   Smokeless tobacco: Never  Vaping Use   Vaping Use: Never used  Substance and Sexual Activity   Alcohol use: Yes    Alcohol/week: 5.0 standard drinks of alcohol    Types: 5 Shots of liquor per week    Comment: occassional   Drug use: No   Sexual activity: Yes    Birth control/protection: None  Other Topics Concern   Not on file  Social History Narrative   Lives in 2 story home with wife   Children live nearby   Social Determinants of Health   Financial Resource Strain: Low Risk  (02/24/2023)   Overall Financial  Resource Strain (CARDIA)    Difficulty of Paying Living Expenses: Not very hard  Food Insecurity: No Food Insecurity (02/24/2023)   Hunger Vital Sign    Worried About Running Out of Food in the Last Year: Never true    Ran Out of Food in the Last Year: Never true  Transportation Needs: No Transportation Needs (02/24/2023)   PRAPARE - Hydrologist (Medical): No    Lack of Transportation (Non-Medical): No  Physical Activity: Insufficiently Active (02/24/2023)   Exercise Vital Sign    Days of Exercise per Week: 4 days    Minutes of Exercise per Session: 20 min  Stress: No Stress Concern Present (02/24/2023)   [place]    Feeling of Stress :  Not at all  Social Connections: Unknown (02/24/2023)   Social Connection and Isolation Panel [NHANES]    Frequency of Communication with Friends and Family: More than three times a week    Frequency of Social Gatherings with Friends and Family: Twice a week    Attends Religious Services: Not on Advertising copywriter or Organizations: Yes    Attends Music therapist: More than 4 times per year    Marital Status: Married    Tobacco Counseling Counseling given: Not Answered   Clinical Intake:  Pre-visit preparation completed: Yes  Pain : No/denies pain Pain Score: 0-No pain     BMI - recorded: 26.39 Nutritional Status: BMI 25 -29 Overweight Nutritional Risks: None Diabetes: No  How often do you need to have someone help you when you read instructions, pamphlets, or other written materials from your doctor or pharmacy?: 1 - Never What is the last grade level you completed in school?: HSG  Diabetic? No  Interpreter Needed?: No  Information entered by :: Lisette Abu, LPN.   Activities of Daily Living    02/24/2023   11:47 AM 02/20/2023    7:48 PM  In your present state of health, do you have any difficulty  performing the following activities:  Hearing? 0 0  Vision? 0 0  Difficulty concentrating or making decisions? 0 0  Walking or climbing stairs? 0 0  Dressing or bathing? 0 0  Doing errands, shopping? 0 0  Preparing Food and eating ? N N  Using the Toilet? N N  In the past six months, have you accidently leaked urine? N N  Do you have problems with loss of bowel control? N N  Managing your Medications? N N  Managing your Finances? N N  Housekeeping or managing your Housekeeping? N N    Patient Care Team: Janith Lima, MD as PCP - General (Internal Medicine) Lorretta Harp, MD as PCP - Cardiology (Cardiology) Szabat, Darnelle Maffucci, [facility] (Inactive) as Pharmacist (Pharmacist) Dermatology, Geryl Rankins, Legrand Como, MD as Consulting Physician (Ophthalmology)  Indicate any recent Medical Services you may have received from other than [facility] providers in the past year (date may be approximate).     Assessment:   This is a routine wellness examination for Jumar.  Hearing/Vision screen Hearing Screening - Comments:: Denies hearing difficulties   Vision Screening - Comments:: Wears rx glasses - up to date with routine eye exams with Marygrace Drought, MD.   Dietary issues and exercise activities discussed: Current Exercise Habits: Home exercise routine, Type of exercise: walking, Time (Minutes): 20, Frequency (Times/Week): 4, Weekly Exercise (Minutes/Week): 80, Intensity: Moderate, Exercise limited by: orthopedic condition(s);cardiac condition(s)   Goals Addressed   None   Depression Screen    02/24/2023   11:36 AM 12/23/2022    8:09 AM 09/04/2022    9:25 AM 02/22/2022    1:36 PM 06/07/2021    9:11 AM 02/07/2021    8:46 AM 05/12/2020    8:55 AM  PHQ 2/9 Scores  PHQ - 2 Score 0 0 0 0 0 0 0  PHQ- 9 Score 0 0   0      Fall Risk    02/24/2023   11:47 AM 02/20/2023    7:48 PM 02/22/2022    1:33 PM 02/07/2021    8:47 AM 03/14/2020    9:12 AM  Fall Risk   Falls in the past year? 1 1 0 1  0  Number  falls in past yr: 0 0 0 0   Injury with Fall? 1 1 0 0   Risk for fall due to :   No Fall Risks No Fall Risks Other (Comment)  Risk for fall due to: Comment     Single leg stand less than 5 seconds.  Follow up   Falls prevention discussed Falls evaluation completed Falls evaluation completed    [place]:  Any stairs in or around the home? Yes  If so, are there any without handrails? No  Home free of loose throw rugs in walkways, pet beds, electrical cords, etc? Yes  Adequate lighting in your home to reduce risk of falls? Yes   ASSISTIVE DEVICES UTILIZED TO PREVENT FALLS:  Life alert? Yes  Apple Watch Use of a cane, walker or w/c? No  Grab bars in the bathroom? Yes  Shower chair or bench in shower?  No Elevated toilet seat or a handicapped toilet? Yes   TIMED UP AND GO:  Was the test performed? No . Telephonic Visit  Cognitive Function:        02/24/2023   11:37 AM 02/22/2022    1:41 PM  6CIT Screen  What Year? 0 points 0 points  What month? 0 points 0 points  What time? 0 points 0 points  Count back from 20 0 points 0 points  Months in reverse 0 points 0 points  Repeat phrase 0 points 0 points  Total Score 0 points 0 points    Immunizations Immunization History  Administered Date(s) Administered   Covid-19, Mrna,Vaccine(Spikevax)67yrs and older 09/26/2022   Fluad Quad(high Dose 65+) 07/27/2019, 09/07/2020, 08/20/2022   Influenza Split 09/25/2012   Influenza, High Dose Seasonal PF 10/15/2016, 10/16/2017, 09/09/2018, 09/17/2021   Influenza,inj,Quad PF,6+ Mos 10/18/2013   PFIZER(Purple Top)SARS-COV-2 Vaccination 01/09/2020, 02/02/2020, 10/15/2020   Pneumococcal Conjugate-13 05/31/2015   Pneumococcal Polysaccharide-23 06/18/2013, 12/14/2018   Td 11/15/2008   Tdap 07/06/2019   Zoster Recombinat (Shingrix) 11/01/2020, 01/16/2021   Zoster, Live 07/16/2013    TDAP status: Up to date  Flu Vaccine status: Up to  date  Pneumococcal vaccine status: Up to date  Covid-19 vaccine status: Completed vaccines  Qualifies for Shingles Vaccine? Yes   Zostavax completed Yes   Shingrix Completed?: Yes  Screening Tests Health Maintenance  Topic Date Due   Medicare Annual Wellness (AWV)  02/24/2024   DTaP/Tdap/Td (3 - Td or Tdap) 07/05/2029   Pneumonia Vaccine 66+ Years old  Completed   INFLUENZA VACCINE  Completed   Hepatitis C Screening  Completed   Zoster Vaccines- Shingrix  Completed   HPV VACCINES  Aged Out   COLONOSCOPY (Pts 45-3yrs Insurance coverage will need to be confirmed)  Discontinued   COVID-19 Vaccine  Discontinued   Fecal DNA (Cologuard)  Discontinued    Health Maintenance  There are no preventive care reminders to display for this patient.   Colorectal cancer screening: No longer required.   Lung Cancer Screening: (Low Dose CT Chest recommended if Age 69-80 years, 30 pack-year currently smoking OR have quit w/in 15years.) does not qualify.   Lung Cancer Screening Referral: no  Additional Screening:  Hepatitis C Screening: does qualify; Completed 10/15/2016  Vision Screening: Recommended annual ophthalmology exams for early detection of glaucoma and other disorders of the eye. Is the patient up to date with their annual eye exam?  Yes  Who is the provider or what is the name of the office in which the patient attends annual  eye exams? Marygrace Drought, MD. If pt is not established with a provider, would they like to be referred to a provider to establish care? No .   Dental Screening: Recommended annual dental exams for proper oral hygiene  Community Resource Referral / Chronic Care Management: CRR required this visit?  No   CCM required this visit?  No      Plan:     I have personally reviewed and noted the following in the patient's chart:   Medical and social history Use of alcohol, tobacco or illicit drugs  Current medications and supplements including opioid  prescriptions. Patient is not currently taking opioid prescriptions. Functional ability and status Nutritional status Physical activity Advanced directives List of other physicians Hospitalizations, surgeries, and ER visits in previous 12 months Vitals Screenings to include cognitive, depression, and falls Referrals and appointments  In addition, I have reviewed and discussed with patient certain preventive protocols, quality metrics, and best practice recommendations. A written personalized care plan for preventive services as well as general preventive health recommendations were provided to patient.     Sheral Flow, LPN   [id number]   Nurse Notes: Normal cognitive status assessed by direct observation by this Nurse Health Advisor. No abnormalities found.   Patient Medicare AWV questionnaire was completed by the patient on 02/20/2023; I have confirmed that all information answered by patient is correct and no changes since this date.

## 2023-02-24 NOTE — Patient Instructions (Addendum)
John Mccarthy , Thank you for taking time to come for your Medicare Wellness Visit. I appreciate your ongoing commitment to your health goals. Please review the following plan we discussed and let me know if I can assist you in the future.   These are the goals we discussed:  Goals      Client understands the importance of follow-up with providers by attending scheduled visits.        This is a list of the screening recommended for you and due dates:  Health Maintenance  Topic Date Due   Medicare Annual Wellness Visit  02/24/2024   DTaP/Tdap/Td vaccine (3 - Td or Tdap) 07/05/2029   Pneumonia Vaccine  Completed   Flu Shot  Completed   Hepatitis C Screening: USPSTF Recommendation to screen - Ages 18-79 yo.  Completed   Zoster (Shingles) Vaccine  Completed   HPV Vaccine  Aged Out   Colon Cancer Screening  Discontinued   COVID-19 Vaccine  Discontinued   Cologuard (Stool DNA test)  Discontinued    Advanced directives: No  Conditions/risks identified: Yes  Next appointment: Follow up in one year for your annual wellness visit.   Preventive Care 75 Years and Older, Male  Preventive care refers to lifestyle choices and visits with your health care provider that can promote health and wellness. What does preventive care include? A yearly physical exam. This is also called an annual well check. Dental exams once or twice a year. Routine eye exams. Ask your health care provider how often you should have your eyes checked. Personal lifestyle choices, including: Daily care of your teeth and gums. Regular physical activity. Eating a healthy diet. Avoiding tobacco and drug use. Limiting alcohol use. Practicing safe sex. Taking low doses of aspirin every day. Taking vitamin and mineral supplements as recommended by your health care provider. What happens during an annual well check? The services and screenings done by your health care provider during your annual well check will depend  on your age, overall health, lifestyle risk factors, and family history of disease. Counseling  Your health care provider may ask you questions about your: Alcohol use. Tobacco use. Drug use. Emotional well-being. Home and relationship well-being. Sexual activity. Eating habits. History of falls. Memory and ability to understand (cognition). Work and work Statistician. Screening  You may have the following tests or measurements: Height, weight, and BMI. Blood pressure. Lipid and cholesterol levels. These may be checked every 5 years, or more frequently if you are over 45 years old. Skin check. Lung cancer screening. You may have this screening every year starting at age 46 if you have a 30-pack-year history of smoking and currently smoke or have quit within the past 15 years. Fecal occult blood test (FOBT) of the stool. You may have this test every year starting at age 72. Flexible sigmoidoscopy or colonoscopy. You may have a sigmoidoscopy every 5 years or a colonoscopy every 10 years starting at age 85. Prostate cancer screening. Recommendations will vary depending on your family history and other risks. Hepatitis C blood test. Hepatitis B blood test. Sexually transmitted disease (STD) testing. Diabetes screening. This is done by checking your blood sugar (glucose) after you have not eaten for a while (fasting). You may have this done every 1-3 years. Abdominal aortic aneurysm (AAA) screening. You may need this if you are a current or former smoker. Osteoporosis. You may be screened starting at age 51 if you are at high risk. Talk with your health  care provider about your test results, treatment options, and if necessary, the need for more tests. Vaccines  Your health care provider may recommend certain vaccines, such as: Influenza vaccine. This is recommended every year. Tetanus, diphtheria, and acellular pertussis (Tdap, Td) vaccine. You may need a Td booster every 10  years. Zoster vaccine. You may need this after age 34. Pneumococcal 13-valent conjugate (PCV13) vaccine. One dose is recommended after age 5. Pneumococcal polysaccharide (PPSV23) vaccine. One dose is recommended after age 38. Talk to your health care provider about which screenings and vaccines you need and how often you need them. This information is not intended to replace advice given to you by your health care provider. Make sure you discuss any questions you have with your health care provider. Document Released: 12/15/2015 Document Revised: 08/07/2016 Document Reviewed: 09/19/2015 Elsevier Interactive Patient Education  2017 [place] Prevention in the Home Falls can cause injuries. They can happen to people of all ages. There are many things you can do to make your home safe and to help prevent falls. What can I do on the outside of my home? Regularly fix the edges of walkways and driveways and fix any cracks. Remove anything that might make you trip as you walk through a door, such as a raised step or threshold. Trim any bushes or trees on the path to your home. Use bright outdoor lighting. Clear any walking paths of anything that might make someone trip, such as rocks or tools. Regularly check to see if handrails are loose or broken. Make sure that both sides of any steps have handrails. Any raised decks and porches should have guardrails on the edges. Have any leaves, snow, or ice cleared regularly. Use sand or salt on walking paths during winter. Clean up any spills in your garage right away. This includes oil or grease spills. What can I do in the bathroom? Use night lights. Install grab bars by the toilet and in the tub and shower. Do not use towel bars as grab bars. Use non-skid mats or decals in the tub or shower. If you need to sit down in the shower, use a plastic, non-slip stool. Keep the floor dry. Clean up any water that spills on the floor as soon as it  happens. Remove soap buildup in the tub or shower regularly. Attach bath mats securely with double-sided non-slip rug tape. Do not have throw rugs and other things on the floor that can make you trip. What can I do in the bedroom? Use night lights. Make sure that you have a light by your bed that is easy to reach. Do not use any sheets or blankets that are too big for your bed. They should not hang down onto the floor. Have a firm chair that has side arms. You can use this for support while you get dressed. Do not have throw rugs and other things on the floor that can make you trip. What can I do in the kitchen? Clean up any spills right away. Avoid walking on wet floors. Keep items that you use a lot in easy-to-reach places. If you need to reach something above you, use a strong step stool that has a grab bar. Keep electrical cords out of the way. Do not use floor polish or wax that makes floors slippery. If you must use wax, use non-skid floor wax. Do not have throw rugs and other things on the floor that can make you trip. What can  I do with my stairs? Do not leave any items on the stairs. Make sure that there are handrails on both sides of the stairs and use them. Fix handrails that are broken or loose. Make sure that handrails are as long as the stairways. Check any carpeting to make sure that it is firmly attached to the stairs. Fix any carpet that is loose or worn. Avoid having throw rugs at the top or bottom of the stairs. If you do have throw rugs, attach them to the floor with carpet tape. Make sure that you have a light switch at the top of the stairs and the bottom of the stairs. If you do not have them, ask someone to add them for you. What else can I do to help prevent falls? Wear shoes that: Do not have high heels. Have rubber bottoms. Are comfortable and fit you well. Are closed at the toe. Do not wear sandals. If you use a stepladder: Make sure that it is fully opened.  Do not climb a closed stepladder. Make sure that both sides of the stepladder are locked into place. Ask someone to hold it for you, if possible. Clearly mark and make sure that you can see: Any grab bars or handrails. First and last steps. Where the edge of each step is. Use tools that help you move around (mobility aids) if they are needed. These include: Canes. Walkers. Scooters. Crutches. Turn on the lights when you go into a dark area. Replace any light bulbs as soon as they burn out. Set up your furniture so you have a clear path. Avoid moving your furniture around. If any of your floors are uneven, fix them. If there are any pets around you, be aware of where they are. Review your medicines with your doctor. Some medicines can make you feel dizzy. This can increase your chance of falling. Ask your doctor what other things that you can do to help prevent falls. This information is not intended to replace advice given to you by your health care provider. Make sure you discuss any questions you have with your health care provider. Document Released: 09/14/2009 Document Revised: 04/25/2016 Document Reviewed: 12/23/2014 Elsevier Interactive Patient Education  2017 [employer].

## 2023-02-24 NOTE — Telephone Encounter (Signed)
Patient would like the following referrals: Hearing evaluation and Cardiology.

## 2023-03-04 ENCOUNTER — Encounter: Payer: Self-pay | Admitting: Cardiovascular Disease

## 2023-03-26 ENCOUNTER — Other Ambulatory Visit: Payer: Self-pay | Admitting: Cardiovascular Disease

## 2023-04-24 ENCOUNTER — Other Ambulatory Visit: Payer: Self-pay | Admitting: Cardiovascular Disease

## 2023-06-22 ENCOUNTER — Other Ambulatory Visit: Payer: Self-pay | Admitting: Cardiovascular Disease

## 2023-06-25 ENCOUNTER — Other Ambulatory Visit: Payer: Self-pay | Admitting: Cardiovascular Disease

## 2023-07-18 DIAGNOSIS — D2262 Melanocytic nevi of left upper limb, including shoulder: Secondary | ICD-10-CM | POA: Diagnosis not present

## 2023-07-18 DIAGNOSIS — L570 Actinic keratosis: Secondary | ICD-10-CM | POA: Diagnosis not present

## 2023-07-18 DIAGNOSIS — L821 Other seborrheic keratosis: Secondary | ICD-10-CM | POA: Diagnosis not present

## 2023-07-18 DIAGNOSIS — D225 Melanocytic nevi of trunk: Secondary | ICD-10-CM | POA: Diagnosis not present

## 2023-07-18 DIAGNOSIS — Z85828 Personal history of other malignant neoplasm of skin: Secondary | ICD-10-CM | POA: Diagnosis not present

## 2023-07-18 DIAGNOSIS — D2272 Melanocytic nevi of left lower limb, including hip: Secondary | ICD-10-CM | POA: Diagnosis not present

## 2023-07-18 DIAGNOSIS — L814 Other melanin hyperpigmentation: Secondary | ICD-10-CM | POA: Diagnosis not present

## 2023-07-18 DIAGNOSIS — D2271 Melanocytic nevi of right lower limb, including hip: Secondary | ICD-10-CM | POA: Diagnosis not present

## 2023-08-01 DIAGNOSIS — H524 Presbyopia: Secondary | ICD-10-CM | POA: Diagnosis not present

## 2023-08-01 DIAGNOSIS — H5213 Myopia, bilateral: Secondary | ICD-10-CM | POA: Diagnosis not present

## 2023-08-01 DIAGNOSIS — H2513 Age-related nuclear cataract, bilateral: Secondary | ICD-10-CM | POA: Diagnosis not present

## 2023-08-01 DIAGNOSIS — H52203 Unspecified astigmatism, bilateral: Secondary | ICD-10-CM | POA: Diagnosis not present

## 2023-08-01 DIAGNOSIS — H353132 Nonexudative age-related macular degeneration, bilateral, intermediate dry stage: Secondary | ICD-10-CM | POA: Diagnosis not present

## 2023-08-14 ENCOUNTER — Telehealth: Payer: Self-pay

## 2023-08-14 NOTE — Telephone Encounter (Signed)
Left message for patient to contact our office and schedule is annuals visit. Currently on Lab schedule for 08/28/23 and will not be able to hve labs until next visit.

## 2023-08-28 ENCOUNTER — Other Ambulatory Visit: Payer: BLUE CROSS/BLUE SHIELD

## 2023-09-02 ENCOUNTER — Encounter: Payer: Self-pay | Admitting: Internal Medicine

## 2023-09-02 ENCOUNTER — Ambulatory Visit (INDEPENDENT_AMBULATORY_CARE_PROVIDER_SITE_OTHER): Payer: Medicare Other | Admitting: Internal Medicine

## 2023-09-02 VITALS — BP 128/78 | HR 71 | Temp 98.1°F | Resp 16 | Ht 73.0 in | Wt 208.0 lb

## 2023-09-02 DIAGNOSIS — M170 Bilateral primary osteoarthritis of knee: Secondary | ICD-10-CM

## 2023-09-02 DIAGNOSIS — E785 Hyperlipidemia, unspecified: Secondary | ICD-10-CM

## 2023-09-02 DIAGNOSIS — I255 Ischemic cardiomyopathy: Secondary | ICD-10-CM

## 2023-09-02 DIAGNOSIS — Z23 Encounter for immunization: Secondary | ICD-10-CM | POA: Diagnosis not present

## 2023-09-02 DIAGNOSIS — F3342 Major depressive disorder, recurrent, in full remission: Secondary | ICD-10-CM

## 2023-09-02 DIAGNOSIS — R001 Bradycardia, unspecified: Secondary | ICD-10-CM | POA: Diagnosis not present

## 2023-09-02 DIAGNOSIS — N400 Enlarged prostate without lower urinary tract symptoms: Secondary | ICD-10-CM | POA: Diagnosis not present

## 2023-09-02 LAB — BASIC METABOLIC PANEL
BUN: 14 mg/dL (ref 6–23)
CO2: 29 meq/L (ref 19–32)
Calcium: 9 mg/dL (ref 8.4–10.5)
Chloride: 104 meq/L (ref 96–112)
Creatinine, Ser: 1.07 mg/dL (ref 0.40–1.50)
GFR: 68.46 mL/min (ref >60.00)
Glucose, Bld: 106 mg/dL — ABNORMAL HIGH (ref 70–99)
Potassium: 4.3 meq/L (ref 3.5–5.1)
Sodium: 139 meq/L (ref 135–145)

## 2023-09-02 LAB — TSH: TSH: 2.98 u[IU]/mL (ref 0.35–5.50)

## 2023-09-02 LAB — CBC WITH DIFFERENTIAL/PLATELET
Basophils Absolute: 0.1 10*3/uL (ref 0.0–0.1)
Basophils Relative: 0.7 % (ref 0.0–3.0)
Eosinophils Absolute: 0.2 10*3/uL (ref 0.0–0.7)
Eosinophils Relative: 1.7 % (ref 0.0–5.0)
HCT: 45.2 % (ref 39.0–52.0)
Hemoglobin: 14.6 g/dL (ref 13.0–17.0)
Lymphocytes Relative: 19.4 % (ref 12.0–46.0)
Lymphs Abs: 1.9 10*3/uL (ref 0.7–4.0)
MCHC: 32.4 g/dL (ref 30.0–36.0)
MCV: 91.5 fL (ref 78.0–100.0)
Monocytes Absolute: 0.8 10*3/uL (ref 0.1–1.0)
Monocytes Relative: 8.1 % (ref 3.0–12.0)
Neutro Abs: 6.9 10*3/uL (ref 1.4–7.7)
Neutrophils Relative %: 70.1 % (ref 43.0–77.0)
Platelets: 227 10*3/uL (ref 150.0–400.0)
RBC: 4.94 Mil/uL (ref 4.22–5.81)
RDW: 14.8 % (ref 11.5–15.5)
WBC: 9.8 10*3/uL (ref 4.0–10.5)

## 2023-09-02 LAB — LIPID PANEL
Cholesterol: 115 mg/dL (ref 0–200)
HDL: 43.7 mg/dL (ref >39.00)
LDL Cholesterol: 46 mg/dL (ref 0–99)
NonHDL: 71.34
Total CHOL/HDL Ratio: 3
Triglycerides: 127 mg/dL (ref 0.0–149.0)
VLDL: 25.4 mg/dL (ref 0.0–40.0)

## 2023-09-02 LAB — URINALYSIS, ROUTINE W REFLEX MICROSCOPIC
Bilirubin Urine: NEGATIVE
Hgb urine dipstick: NEGATIVE
Ketones, ur: NEGATIVE
Leukocytes,Ua: NEGATIVE
Nitrite: NEGATIVE
RBC / HPF: NONE SEEN (ref 0–?)
Specific Gravity, Urine: 1.025 (ref 1.000–1.030)
Total Protein, Urine: NEGATIVE
Urine Glucose: NEGATIVE
Urobilinogen, UA: 0.2 (ref 0.0–1.0)
WBC, UA: NONE SEEN (ref 0–?)
pH: 6 (ref 5.0–8.0)

## 2023-09-02 LAB — HEPATIC FUNCTION PANEL
ALT: 21 U/L (ref 0–53)
AST: 23 U/L (ref 0–37)
Albumin: 4.2 g/dL (ref 3.5–5.2)
Alkaline Phosphatase: 90 U/L (ref 39–117)
Bilirubin, Direct: 0.1 mg/dL (ref 0.0–0.3)
Total Bilirubin: 0.7 mg/dL (ref 0.2–1.2)
Total Protein: 6.8 g/dL (ref 6.0–8.3)

## 2023-09-02 LAB — PSA: PSA: 0.89 ng/mL (ref 0.10–4.00)

## 2023-09-02 MED ORDER — SERTRALINE HCL 50 MG PO TABS
50.0000 mg | ORAL_TABLET | Freq: Every day | ORAL | 1 refills | Status: DC
Start: 2023-09-02 — End: 2024-05-05

## 2023-09-02 NOTE — Patient Instructions (Signed)
Coronary Artery Disease, Male Coronary artery disease (CAD) is a condition in which the arteries that lead to the heart (coronary arteries) become narrow or blocked. The narrowing or blockage can lead to decreased blood flow to the heart. Prolonged reduced blood flow can cause a heart attack (myocardial infarction, or MI). This condition may also be called coronary heart disease. CAD is the most common type of heart disease, and heart disease is the leading cause of death in men. It is important to understand what causes CAD and how it is treated. What are the causes? CAD is most often caused by atherosclerosis. This is the buildup of fat and cholesterol (plaque) on the inside of the arteries. Over time, the plaque may narrow or block the artery, reducing blood flow to the heart. Plaque can also become weak and break off within a coronary artery and cause a sudden blockage. Other less common causes of CAD include: A blood clot or a piece of another substance that blocks the flow of blood in a coronary artery (embolism). A tearing of the artery (spontaneous coronary artery dissection). An enlargement of an artery (aneurysm). Inflammation (vasculitis) in the artery wall. What increases the risk? The following factors may make you more likely to develop this condition: Age. Men older than 59 years are at a greater risk of CAD. Family history of CAD. High blood pressure (hypertension). Diabetes. High cholesterol levels. Obesity. Other risk factors include: Tobacco use. Excessive alcohol use. Lack of exercise. A diet high in saturated and trans fats, such as fried food and processed meat. What are the signs or symptoms? Many people do not have any symptoms during the early stages of CAD. As the condition progresses, symptoms may include: Chest pain (angina). The pain can: Feel like crushing or squeezing, or like a tightness, pressure, fullness, or heaviness in the chest. Last more than a few  minutes or can stop and recur. The pain tends to get worse with exercise or stress and to fade with rest. Pain in the arms, neck, jaw, ear, or back. Unexplained heartburn or indigestion. Shortness of breath. Nausea or vomiting. Sudden light-headedness. Sudden cold sweats. Fluttering or fast heartbeat (palpitations). How is this diagnosed? This condition is diagnosed based on: Your family and medical history. A physical exam. Tests. These may include: A test to check the electrical signals in your heart (electrocardiogram). Exercise stress test. This looks for signs of blockage when the heart is stressed with exercise, such as running on a treadmill. Pharmacologic stress test. This test looks for signs of blockage when the heart is being stressed with a medicine. Blood tests to check levels of cardiac enzymes such as troponin and creatine kinase. Coronary angiogram. This is a procedure to look at the coronary arteries to see if there is any blockage. During this test, a dye is injected into your arteries so they appear on an X-ray. Coronary artery CT scan. This scan helps detect calcium deposits in your coronary arteries. Calcium deposits are an indicator of CAD. A test that uses sound waves to take a picture of your heart (echocardiogram). How is this treated? This condition may be treated by: Healthy lifestyle changes to reduce risk factors. Medicines such as: Antiplatelet medicines such as clopidogrel or aspirin. These help to prevent blood clots. Nitroglycerin. Blood pressure medicines. Cholesterol-lowering medicine. Coronary angioplasty and stenting. During this procedure, a thin, flexible tube is inserted through a blood vessel and into a blocked artery. A balloon or similar device on the  end of the tube is inflated to open up the artery. In some cases, a small, mesh tube (stent) is inserted into the artery to keep it open. Coronary artery bypass surgery. During this surgery, veins  or arteries from other parts of the body are used to create a bypass around the blockage and allow blood to reach your heart. Follow these instructions at home: Medicines Take over-the-counter and prescription medicines only as told by your health care provider. Do not take the following medicines unless your health care provider approves: NSAIDs, such as ibuprofen, naproxen, or celecoxib. Vitamin supplements that contain vitamin A, vitamin E, or both. Lifestyle Follow an exercise program approved by your health care provider. Ask your health care provider if cardiac rehab is appropriate. Maintain a healthy weight or lose weight as approved by your health care provider. Learn to manage stress or try to limit your stress. Ask your health care provider for suggestions if you need help. Get screened for depression and seek treatment, if needed. Do not use any products that contain nicotine or tobacco. These products include cigarettes, chewing tobacco, and vaping devices, such as e-cigarettes. If you need help quitting, ask your health care provider. Eating and drinking  Follow a heart-healthy diet. A dietitian can help educate you about healthy food options and changes. In general, eat plenty of fruits and vegetables, lean meats, and whole grains. Avoid foods high in: Sugar. Salt (sodium). Saturated fat, such as processed or fatty meat. Trans fat, such as fried foods. Use healthy cooking methods such as roasting, grilling, broiling, baking, poaching, steaming, or stir-frying. Do not drink alcohol if your health care provider tells you not to drink. If you drink alcohol: Limit how much you have to 0-2 drinks a day. Know how much alcohol is in your drink. In the [place] one drink equals one 12 oz bottle of beer (355 mL), one 5 oz glass of wine (148 mL), or one 1 oz glass of hard liquor (44 mL). General instructions Manage any other health conditions, such as high cholesterol, hypertension, and  diabetes. These conditions affect your heart. Your health care provider may ask you to monitor your blood pressure. Keep all follow-up visits. This is important. Get help right away if: You have pain in your chest, neck, ear, arm, jaw, stomach, or back that: Lasts more than a few minutes. Is recurring. Is not relieved by taking medicine under your tongue (sublingual nitroglycerin). You have profuse sweating without cause. You have unexplained: Heartburn or indigestion. Shortness of breath or difficulty breathing. Fluttering or fast heartbeat (palpitations). Nausea or vomiting. Fatigue or weakness. Feelings of nervousness or anxiety. You have sudden light-headedness or dizziness. You faint. These symptoms may be an emergency. Get help right away. Call 911. Do not wait to see if the symptoms will go away. Do not drive yourself to the hospital. Summary Coronary artery disease (CAD) is a condition in which the arteries that lead to the heart (coronary arteries) become narrow or blocked. Prolonged reduced blood flow can cause a heart attack. CAD can be treated with lifestyle changes, medicines, coronary angioplasty or stents, coronary artery bypass surgery, or a combination of these treatments. Keep all follow-up visits. This is important. This information is not intended to replace advice given to you by your health care provider. Make sure you discuss any questions you have with your health care provider. Document Revised: 10/17/2021 Document Reviewed: 10/17/2021 Elsevier Patient Education  2024 [employer].

## 2023-09-02 NOTE — Progress Notes (Signed)
Subjective:  Patient ID: John Mccarthy, male    DOB: 1951-09-25  Age: 72 y.o. MRN: [id number]  CC: Hyperlipidemia, Depression, and Coronary Artery Disease   HPI John Mccarthy presents for f/up -----  Discussed the use of AI scribe software for clinical note transcription with the patient, who gave verbal consent to proceed.  History of Present Illness   The patient, with a history of cardiomyopathy, has been maintaining well with no complaints of chest pain, shortness of breath, dizziness, or lightheadedness. They also continue to take sertraline, with no known side effects.  The patient denies any adverse effects from their medications, including Plavix, with no reported dizziness, lightheadedness, bleeding, or bruising. They have been feeling well overall.       Outpatient Medications Prior to Visit  Medication Sig Dispense Refill   atorvastatin (LIPITOR) 80 MG tablet TAKE 1 TABLET BY MOUTH EVERY DAY AT 6 PM 90 tablet 3   carvedilol (COREG) 6.25 MG tablet Take 1 tablet (6.25 mg total) by mouth 2 (two) times daily with a meal. 180 tablet 0   clopidogrel (PLAVIX) 75 MG tablet TAKE 1 TABLET BY MOUTH EVERY DAY 90 tablet 0   Multiple Vitamins-Minerals (CENTRUM SILVER 50+MEN PO) Take 1 tablet by mouth daily.     sertraline (ZOLOFT) 50 MG tablet TAKE 1 TABLET BY MOUTH EVERY DAY 90 tablet 1   nitroGLYCERIN (NITROSTAT) 0.4 MG SL tablet Place 1 tablet (0.4 mg total) under the tongue every 5 (five) minutes as needed for chest pain. 25 tablet 3   No facility-administered medications prior to visit.    ROS Review of Systems  Constitutional: Negative.  Negative for chills, diaphoresis, fatigue and fever.  HENT: Negative.    Eyes: Negative.   Respiratory:  Negative for cough, chest tightness, shortness of breath and wheezing.   Cardiovascular:  Negative for chest pain, palpitations and leg swelling.  Gastrointestinal:  Negative for abdominal pain, constipation, diarrhea and  nausea.  Endocrine: Negative.   Genitourinary: Negative.  Negative for difficulty urinating.  Musculoskeletal: Negative.  Negative for arthralgias, gait problem, joint swelling and myalgias.  Skin: Negative.   Neurological: Negative.  Negative for dizziness, weakness and light-headedness.  Hematological:  Negative for adenopathy. Does not bruise/bleed easily.  Psychiatric/Behavioral: Negative.  Negative for behavioral problems, hallucinations, sleep disturbance and suicidal ideas. The patient is not nervous/anxious.     Objective:  BP 128/78 (BP Location: Left Arm, Patient Position: Sitting, Cuff Size: Large)   Pulse 71   Temp 98.1 F (36.7 C) (Oral)   Resp 16   Ht 6\' 1"  (1.854 m)   Wt 208 lb (94.3 kg)   SpO2 95%   BMI 27.44 kg/m   BP Readings from Last 3 Encounters:  09/02/23 128/78  12/23/22 114/62  10/08/22 (!) 103/45    Wt Readings from Last 3 Encounters:  09/02/23 208 lb (94.3 kg)  02/24/23 200 lb (90.7 kg)  12/23/22 211 lb (95.7 kg)    Physical Exam Vitals reviewed.  Constitutional:      Appearance: Normal appearance. He is not ill-appearing.  HENT:     Mouth/Throat:     Mouth: Mucous membranes are moist.  Eyes:     General: No scleral icterus.    Conjunctiva/sclera: Conjunctivae normal.  Cardiovascular:     Rate and Rhythm: Normal rate and regular rhythm.     Heart sounds: Normal heart sounds, S1 normal and S2 normal.     No friction rub. No gallop.  Comments: EKG- NSR, 60 bpm Low voltage Septal infarct pattern Unchanged Pulmonary:     Effort: Pulmonary effort is normal.     Breath sounds: No stridor. No wheezing, rhonchi or rales.  Abdominal:     General: Abdomen is flat.     Palpations: There is no mass.     Tenderness: There is no abdominal tenderness. There is no guarding.     Hernia: No hernia is present.  Musculoskeletal:     Cervical back: Neck supple.     Right lower leg: No edema.     Left lower leg: No edema.  Neurological:      General: No focal deficit present.     Mental Status: He is alert. Mental status is at baseline.  Psychiatric:        Mood and Affect: Mood normal.        Behavior: Behavior normal.        Thought Content: Thought content normal.     Lab Results  Component Value Date   WBC 9.8 09/02/2023   HGB 14.6 09/02/2023   HCT 45.2 09/02/2023   PLT 227.0 09/02/2023   GLUCOSE 106 (H) 09/02/2023   CHOL 115 09/02/2023   TRIG 127.0 09/02/2023   HDL 43.70 09/02/2023   LDLDIRECT 146.0 10/15/2016   LDLCALC 46 09/02/2023   ALT 21 09/02/2023   AST 23 09/02/2023   NA 139 09/02/2023   K 4.3 09/02/2023   CL 104 09/02/2023   CREATININE 1.07 09/02/2023   BUN 14 09/02/2023   CO2 29 09/02/2023   TSH 2.98 09/02/2023   PSA 0.89 09/02/2023   INR 1.1 08/25/2022   HGBA1C 5.5 10/15/2016    CT ANGIO GI BLEED  Result Date: 08/25/2022 CLINICAL DATA:  gi bleed EXAM: CTA ABDOMEN AND PELVIS WITHOUT AND WITH CONTRAST TECHNIQUE: Multidetector CT imaging of the abdomen and pelvis was performed using the standard protocol during bolus administration of intravenous contrast. Multiplanar reconstructed images and MIPs were obtained and reviewed to evaluate the vascular anatomy. RADIATION DOSE REDUCTION: This exam was performed according to the departmental dose-optimization program which includes automated exposure control, adjustment of the mA and/or kV according to patient size and/or use of iterative reconstruction technique. CONTRAST:  OMNIPAQUE IOHEXOL 350 MG/ML SOLN COMPARISON:  None FINDINGS: VASCULAR No active extravasation of contrast identified. Aorta: Mild atherosclerotic plaque. Normal caliber aorta without aneurysm, dissection, vasculitis or significant stenosis. Celiac: Patent without evidence of aneurysm, dissection, vasculitis or significant stenosis. SMA: Patent without evidence of aneurysm, dissection, vasculitis or significant stenosis. Renals: Both renal arteries are patent without evidence of  aneurysm, dissection, vasculitis, fibromuscular dysplasia or significant stenosis. IMA: Patent without evidence of aneurysm, dissection, vasculitis or significant stenosis. Inflow: Mild patent without evidence of aneurysm, dissection, vasculitis or significant stenosis. Proximal Outflow: Bilateral common femoral and visualized portions of the superficial and profunda femoral arteries are patent without evidence of aneurysm, dissection, vasculitis or significant stenosis. Veins: The main portal, splenic, superior mesenteric veins are patent. Inferior vena cava is patent. Review of the MIP images confirms the above findings. NON-VASCULAR Lower chest: Elevated right hemidiaphragm with atelectasis of the right lower lobe. No acute abnormality. Coronary artery calcification. Hepatobiliary: No focal liver abnormality. No gallstones, gallbladder wall thickening, or pericholecystic fluid. No biliary dilatation. Pancreas: Diffusely atrophic. No focal lesion. Otherwise normal pancreatic contour. No surrounding inflammatory changes. No main pancreatic ductal dilatation. Spleen: Normal in size without focal abnormality.  Splenule noted. Adrenals/Urinary Tract: No adrenal nodule bilaterally. Bilateral kidneys enhance  symmetrically. Couple fluid dense lesions within the left kidney likely represent simple renal cysts (6:42,60) likely represents a simple renal cyst. Simple renal cysts, in the absence of clinically indicated signs/symptoms, require no independent follow-up. Subcentimeter hypodensities are too small to characterize. No hydronephrosis. No hydroureter. The urinary bladder is unremarkable. Fluid density lesion Stomach/Bowel: Stomach is within normal limits. No evidence of bowel wall thickening or dilatation. Colonic diverticulosis. Second portion duodenal diverticula. Appendix appears normal. Lymphatic: No lymphadenopathy. Reproductive: Prostate is unremarkable. Other: No intraperitoneal free fluid. No intraperitoneal  free gas. No organized fluid collection. Musculoskeletal: Tiny fat containing left inguinal hernia. Small fat containing umbilical hernia. No suspicious lytic or blastic osseous lesions. No acute displaced fracture. Multilevel degenerative changes of the spine. Mild to moderate bilateral hip degenerative changes, right greater than left. IMPRESSION: VASCULAR 1. No CT evidence of active bleed. 2.  Aortic Atherosclerosis (ICD10-I70.0). NON-VASCULAR 1. Colonic diverticulosis with no acute diverticulitis. 2. Tiny fat containing left inguinal hernia. Small fat containing bili cul hernia. Electronically Signed   By: Tish Frederickson M.D.   On: 08/25/2022 20:04    Assessment & Plan:   Dyslipidemia, goal LDL below 70 - LDL goal achieved. Doing well on the statin  -     Lipid panel; Future -     CBC with Differential/Platelet; Future -     Hepatic function panel; Future -     TSH; Future  Benign prostatic hyperplasia without lower urinary tract symptoms -     CBC with Differential/Platelet; Future -     PSA; Future -     Urinalysis, Routine w reflex microscopic; Future  Bradycardia- HR is normal today. -     CBC with Differential/Platelet; Future -     EKG 12-Lead  Ischemic cardiomyopathy- EKG is unchanged. -     CBC with Differential/Platelet; Future -     Basic metabolic panel; Future  Flu vaccine need -     Flu Vaccine Trivalent High Dose (Fluad)     Follow-up: Return in about 6 months (around 03/02/2024).  Sanda Linger, MD

## 2023-09-12 ENCOUNTER — Emergency Department (HOSPITAL_COMMUNITY)
Admission: EM | Admit: 2023-09-12 | Discharge: 2023-09-12 | Disposition: A | Discharge status: Home / Self Care | Payer: Medicare Other | Attending: Student | Admitting: Student

## 2023-09-12 ENCOUNTER — Other Ambulatory Visit: Payer: Self-pay

## 2023-09-12 ENCOUNTER — Emergency Department (HOSPITAL_COMMUNITY): Payer: Medicare Other

## 2023-09-12 DIAGNOSIS — R55 Syncope and collapse: Secondary | ICD-10-CM | POA: Diagnosis not present

## 2023-09-12 DIAGNOSIS — I6782 Cerebral ischemia: Secondary | ICD-10-CM | POA: Diagnosis not present

## 2023-09-12 DIAGNOSIS — R42 Dizziness and giddiness: Secondary | ICD-10-CM | POA: Diagnosis not present

## 2023-09-12 DIAGNOSIS — I252 Old myocardial infarction: Secondary | ICD-10-CM | POA: Diagnosis not present

## 2023-09-12 DIAGNOSIS — M25519 Pain in unspecified shoulder: Secondary | ICD-10-CM | POA: Diagnosis not present

## 2023-09-12 DIAGNOSIS — I2510 Atherosclerotic heart disease of native coronary artery without angina pectoris: Secondary | ICD-10-CM | POA: Insufficient documentation

## 2023-09-12 DIAGNOSIS — Z7902 Long term (current) use of antithrombotics/antiplatelets: Secondary | ICD-10-CM | POA: Diagnosis not present

## 2023-09-12 DIAGNOSIS — Z85828 Personal history of other malignant neoplasm of skin: Secondary | ICD-10-CM | POA: Insufficient documentation

## 2023-09-12 DIAGNOSIS — N400 Enlarged prostate without lower urinary tract symptoms: Secondary | ICD-10-CM | POA: Insufficient documentation

## 2023-09-12 DIAGNOSIS — Z7982 Long term (current) use of aspirin: Secondary | ICD-10-CM | POA: Diagnosis not present

## 2023-09-12 DIAGNOSIS — Z86711 Personal history of pulmonary embolism: Secondary | ICD-10-CM | POA: Diagnosis not present

## 2023-09-12 LAB — URINALYSIS, ROUTINE W REFLEX MICROSCOPIC
Bilirubin Urine: NEGATIVE
Glucose, UA: NEGATIVE mg/dL
Hgb urine dipstick: NEGATIVE
Ketones, ur: NEGATIVE mg/dL
Leukocytes,Ua: NEGATIVE
Nitrite: NEGATIVE
Protein, ur: NEGATIVE mg/dL
Specific Gravity, Urine: 1.009 (ref 1.005–1.030)
pH: 6 (ref 5.0–8.0)

## 2023-09-12 LAB — BASIC METABOLIC PANEL
Anion gap: 11 (ref 5–15)
BUN: 14 mg/dL (ref 8–23)
CO2: 25 mmol/L (ref 22–32)
Calcium: 9 mg/dL (ref 8.9–10.3)
Chloride: 105 mmol/L (ref 98–111)
Creatinine, Ser: 1.14 mg/dL (ref 0.61–1.24)
GFR, Estimated: >60 mL/min (ref >60)
Glucose, Bld: 117 mg/dL — ABNORMAL HIGH (ref 70–99)
Potassium: 4 mmol/L (ref 3.5–5.1)
Sodium: 141 mmol/L (ref 135–145)

## 2023-09-12 LAB — CBC WITH DIFFERENTIAL/PLATELET
Abs Immature Granulocytes: 0.02 10*3/uL (ref 0.00–0.07)
Basophils Absolute: 0.1 10*3/uL (ref 0.0–0.1)
Basophils Relative: 1 %
Eosinophils Absolute: 0.1 10*3/uL (ref 0.0–0.5)
Eosinophils Relative: 1 %
HCT: 47.1 % (ref 39.0–52.0)
Hemoglobin: 15.3 g/dL (ref 13.0–17.0)
Immature Granulocytes: 0 %
Lymphocytes Relative: 17 %
Lymphs Abs: 1.4 10*3/uL (ref 0.7–4.0)
MCH: 30.1 pg (ref 26.0–34.0)
MCHC: 32.5 g/dL (ref 30.0–36.0)
MCV: 92.7 fL (ref 80.0–100.0)
Monocytes Absolute: 0.5 10*3/uL (ref 0.1–1.0)
Monocytes Relative: 6 %
Neutro Abs: 6.3 10*3/uL (ref 1.7–7.7)
Neutrophils Relative %: 75 %
Platelets: 216 10*3/uL (ref 150–400)
RBC: 5.08 MIL/uL (ref 4.22–5.81)
RDW: 13.7 % (ref 11.5–15.5)
WBC: 8.5 10*3/uL (ref 4.0–10.5)
nRBC: 0 % (ref 0.0–0.2)

## 2023-09-12 LAB — RAPID URINE DRUG SCREEN, HOSP PERFORMED
Amphetamines: NOT DETECTED
Barbiturates: NOT DETECTED
Benzodiazepines: NOT DETECTED
Cocaine: NOT DETECTED
Opiates: NOT DETECTED
Tetrahydrocannabinol: NOT DETECTED

## 2023-09-12 LAB — TROPONIN I (HIGH SENSITIVITY)
Troponin I (High Sensitivity): 7 ng/L (ref <18)
Troponin I (High Sensitivity): 7 ng/L (ref <18)

## 2023-09-12 LAB — CBG MONITORING, ED: Glucose-Capillary: 97 mg/dL (ref 70–99)

## 2023-09-12 MED ORDER — LIDOCAINE 5 % EX PTCH
1.0000 | MEDICATED_PATCH | CUTANEOUS | Status: DC
  Administered 2023-09-12: 1 via TRANSDERMAL
  Filled 2023-09-12: qty 1

## 2023-09-12 MED ORDER — LACTATED RINGERS IV BOLUS
1000.0000 mL | Freq: Once | INTRAVENOUS | Status: AC
  Administered 2023-09-12: 1000 mL via INTRAVENOUS
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
## 2023-09-12 NOTE — ED Triage Notes (Signed)
Pt BIBGEMS coming from home after having a near syncopal episode. Pt went to get up to use the restroom, he felt off. Pt then sat until he felt better went to restroom and had a bowel movement. On way back from bathroom patient was dizzy, diaphoretic, and vision went black so he lowered himself to the ground. Pt c/o pain in left of head, neck, jaw, and arm. Pt also has a decreased sensation on the left side of face and arm.  HX - Mi, thinners, and stents  LNW 0930  76 hr 95 ra 134/68 127 cbg

## 2023-09-15 ENCOUNTER — Ambulatory Visit: Payer: Medicare Other | Attending: Cardiovascular Disease | Admitting: Cardiovascular Disease

## 2023-09-15 ENCOUNTER — Encounter: Payer: Self-pay | Admitting: Cardiovascular Disease

## 2023-09-15 ENCOUNTER — Ambulatory Visit: Payer: Medicare Other | Attending: Cardiovascular Disease

## 2023-09-15 VITALS — BP 108/64 | HR 59 | Ht 72.0 in | Wt 206.6 lb

## 2023-09-15 DIAGNOSIS — E785 Hyperlipidemia, unspecified: Secondary | ICD-10-CM | POA: Diagnosis not present

## 2023-09-15 DIAGNOSIS — I255 Ischemic cardiomyopathy: Secondary | ICD-10-CM

## 2023-09-15 DIAGNOSIS — R55 Syncope and collapse: Secondary | ICD-10-CM

## 2023-09-15 DIAGNOSIS — Z9861 Coronary angioplasty status: Secondary | ICD-10-CM

## 2023-09-15 DIAGNOSIS — I2510 Atherosclerotic heart disease of native coronary artery without angina pectoris: Secondary | ICD-10-CM

## 2023-09-15 NOTE — Assessment & Plan Note (Signed)
Recent episode of complete 09/12/2023 thought to be related to dehydration and orthostasis.  His symptoms resolved with IV hydration.  He has not had recurrent symptoms.  I am going to get a 1 week Zio patch to rule out an arrhythmogenic etiology.

## 2023-09-15 NOTE — Progress Notes (Signed)
09/15/2023 John Mccarthy   [birth year]  [id number]  Primary Physician John Grandchild, MD Primary Cardiologist: Runell Gess MD John Mccarthy, [place]  HPI:  John Mccarthy is a 72 y.o.    mildly overweight married Caucasian male  father of 2 grandfather of 4 grandchildren who is retired as a Environmental consultant for heavy Social worker. I last saw him in the office 09/10/2022.  He is accompanied by his wife John Mccarthy today.  He had anterior STEMI  on 02/11/17. He is brought to the cath lab emergently and radial cath by myself revealing an ostial LAD occlusion which I stented with a synergy (3 mm x 12 mm) drug-eluting stent. He did have a 60% proximal to mid dominant RCA stenosis which I elected to treat medically. EF was 50% with anteroapical wall motion abnormality.   His door to balloon time was 22 minutes.  Follow-up 2-D echocardiogram performed 06/24/17 revealed normalization of LV function.  He was on dual antiplatelet therapy including aspirin and Plavix until recently when he discontinued the Plavix which is appropriate.  He is asymptomatic back playing tennis several times a week including singles tennis 1-1/2 hours at a time without limitation.    I last saw him in the hospital this past July when he presented with an anterior STEMI with occlusion of the previously placed stent.  I intervened on him 06/28/2019 and placed 3 additional stents in his proximal LAD.  His durable in time was 46 minutes, delayed because of hypotension and reperfusion arrhythmias.  He did have a 99% ostial first diagonal branch stenosis which was jailed vessel and up in the previously placed stents as well as a 70% mid dominant RCA stenosis that was stable.  His EF was 45 to 50% with anteroapical hypokinesia.  He is asymptomatic.  He is out walking every day, and is currently participating in cardiac rehab. He remains on dual antiplatelet therapy as well as high-dose statin therapy.  He checks his blood  pressure at home religiously which runs in the 110/70 range.   He was admitted to the hospital 08/25/2022 with a upper GI bleed.  He underwent endoscopy revealing ulcer.  He bled down to hemoglobin of 6.  His hemoglobin currently is 12.9.  Aspirin discontinued.  He did describe dyspnea at that time.  He remains on clopidogrel and denies chest pain.  Since I saw him a year ago he did have an episode of syncope on 09/12/2023 and was evaluated in the emergency room.  Apparently he was orthostatic.  His workup was unremarkable.  His symptoms did respond to IV hydration.   Current Meds  Medication Sig   atorvastatin (LIPITOR) 80 MG tablet TAKE 1 TABLET BY MOUTH EVERY DAY AT 6 PM   carvedilol (COREG) 6.25 MG tablet Take 1 tablet (6.25 mg total) by mouth 2 (two) times daily with a meal.   clopidogrel (PLAVIX) 75 MG tablet TAKE 1 TABLET BY MOUTH EVERY DAY   NON FORMULARY Take 1 Scoop by mouth every other day.   NON FORMULARY Take 1 Scoop by mouth 2 (two) times daily.   OVER THE COUNTER MEDICATION Take 1 capsule by mouth 2 (two) times daily.   sertraline (ZOLOFT) 50 MG tablet Take 1 tablet (50 mg total) by mouth daily.     Allergies  Allergen Reactions   Crestor [Rosuvastatin Calcium] Other (See Comments)    Muscle aches   Lactose Intolerance (Gi) Diarrhea    Social  History   Socioeconomic History   Marital status: Married    Spouse name: John Mccarthy   Number of children: Not on file   Years of education: Not on file   Highest education level: Master's degree (e.g., MA, MS, MEng, MEd, MSW, MBA)  Occupational History   Occupation: retired  Tobacco Use   Smoking status: Never   Smokeless tobacco: Never  Vaping Use   Vaping status: Never Used  Substance and Sexual Activity   Alcohol use: Yes    Alcohol/week: 5.0 standard drinks of alcohol    Types: 5 Shots of liquor per week    Comment: occassional   Drug use: No   Sexual activity: Yes    Birth control/protection: None  Other Topics  Concern   Not on file  Social History Narrative   Lives in 2 story home with wife   Children live nearby   Social Determinants of Health   Financial Resource Strain: Low Risk  (09/01/2023)   Overall Financial Resource Strain (CARDIA)    Difficulty of Paying Living Expenses: Not hard at all  Food Insecurity: No Food Insecurity (09/01/2023)   Hunger Vital Sign    Worried About Running Out of Food in the Last Year: Never true    Ran Out of Food in the Last Year: Never true  Transportation Needs: No Transportation Needs (09/01/2023)   PRAPARE - Administrator, Civil Service (Medical): No    Lack of Transportation (Non-Medical): No  Physical Activity: Insufficiently Active (09/01/2023)   Exercise Vital Sign    Days of Exercise per Week: 4 days    Minutes of Exercise per Session: 30 min  Stress: Stress Concern Present (09/01/2023)   [employer] of Occupational Health - Occupational Stress Questionnaire    Feeling of Stress : To some extent  Social Connections: Socially Integrated (09/01/2023)   Social Connection and Isolation Panel [NHANES]    Frequency of Communication with Friends and Family: More than three times a week    Frequency of Social Gatherings with Friends and Family: Twice a week    Attends Religious Services: 1 to 4 times per year    Active Member of [employer] or Organizations: No    Attends Engineer, structural: More than 4 times per year    Marital Status: Married  Catering manager Violence: Not At Risk (02/24/2023)   Humiliation, Afraid, Rape, and Kick questionnaire    Fear of Current or Ex-Partner: No    Emotionally Abused: No    Physically Abused: No    Sexually Abused: No     Review of Systems: General: negative for chills, fever, night sweats or weight changes.  Cardiovascular: negative for chest pain, dyspnea on exertion, edema, orthopnea, palpitations, paroxysmal nocturnal dyspnea or shortness of breath Dermatological: negative for  rash Respiratory: negative for cough or wheezing Urologic: negative for hematuria Abdominal: negative for nausea, vomiting, diarrhea, bright red blood per rectum, melena, or hematemesis Neurologic: negative for visual changes, syncope, or dizziness All other systems reviewed and are otherwise negative except as noted above.    Blood pressure 108/64, pulse (!) 59, height 6' (1.829 m), weight 206 lb 9.6 oz (93.7 kg), SpO2 96%.  General appearance: alert and no distress Neck: no adenopathy, no carotid bruit, no JVD, supple, symmetrical, trachea midline, and thyroid not enlarged, symmetric, no tenderness/mass/nodules Lungs: clear to auscultation bilaterally Heart: regular rate and rhythm, S1, S2 normal, no murmur, click, rub or gallop Extremities: extremities normal, atraumatic, no cyanosis  or edema Pulses: 2+ and symmetric Skin: Skin color, texture, turgor normal. No rashes or lesions Neurologic: Grossly normal  EKG not performed today      ASSESSMENT AND PLAN:   Dyslipidemia, goal LDL below 70 History of dyslipidemia on high-dose statin therapy with lipid profile performed 09/02/2023 revealing total cholesterol 115, LDL 46 and HDL 43.  CAD S/P percutaneous coronary angioplasty History of CAD status post anterior STEMI 02/11/2017.  I took him to the Cath Lab revealing occluded ostial LAD which I stented with a 3 mm x 12 mm long drug-eluting stent.  He did have a 60% proximal to mid dominant RCA stenosis with an EF of 50% and anteroapical wall motion abnormality.  His door to balloon time was 22 minutes.  Follow-up 2D echocardiogram performed 06/20/2017 revealed normalization of LV function.  He presented again with an anterior STEMI 06/28/2019 with occlusion of his proximal LAD stent.  His Dorita balloon time was 46 minutes, delayed because of reperfusion arrhythmia and hypotension.  He was restented.  He remains on clopidogrel.  He denies chest pain or shortness of breath.  Ischemic  cardiomyopathy History of ischemic cardiomyopathy in the past with echo revealing normal LV function as recently as 12/08/2019  Syncope and collapse Recent episode of complete 09/12/2023 thought to be related to dehydration and orthostasis.  His symptoms resolved with IV hydration.  He has not had recurrent symptoms.  I am going to get a 1 week Zio patch to rule out an arrhythmogenic etiology.     Runell Gess MD FACP,FACC,FAHA, [facility] 09/15/2023 3:46 PM

## 2023-09-15 NOTE — Assessment & Plan Note (Signed)
History of dyslipidemia on high-dose statin therapy with lipid profile performed 09/02/2023 revealing total cholesterol 115, LDL 46 and HDL 43.

## 2023-09-15 NOTE — Assessment & Plan Note (Signed)
History of ischemic cardiomyopathy in the past with echo revealing normal LV function as recently as 12/08/2019

## 2023-09-15 NOTE — Assessment & Plan Note (Signed)
History of CAD status post anterior STEMI 02/11/2017.  I took him to the Cath Lab revealing occluded ostial LAD which I stented with a 3 mm x 12 mm long drug-eluting stent.  He did have a 60% proximal to mid dominant RCA stenosis with an EF of 50% and anteroapical wall motion abnormality.  His door to balloon time was 22 minutes.  Follow-up 2D echocardiogram performed 06/20/2017 revealed normalization of LV function.  He presented again with an anterior STEMI 06/28/2019 with occlusion of his proximal LAD stent.  His Dorita balloon time was 46 minutes, delayed because of reperfusion arrhythmia and hypotension.  He was restented.  He remains on clopidogrel.  He denies chest pain or shortness of breath.

## 2023-09-15 NOTE — Progress Notes (Unsigned)
Enrolled patient for a 7 day Zio XT monitor to be mailed to patients home.

## 2023-09-15 NOTE — Patient Instructions (Addendum)
Medication Instructions:  Your physician recommends that you continue on your current medications as directed. Please refer to the Current Medication list given to you today.  *If you need a refill on your cardiac medications before your next appointment, please call your pharmacy*   Testing/Procedures: ZIO XT- Long Term Monitor Instructions  Your physician has requested you wear a ZIO patch monitor for 7 days.  This is a single patch monitor. [employer] supplies one patch monitor per enrollment. Additional stickers are not available. Please do not apply patch if you will be having a Nuclear Stress Test,  Echocardiogram, Cardiac CT, MRI, or Chest Xray during the period you would be wearing the  monitor. The patch cannot be worn during these tests. You cannot remove and re-apply the  ZIO XT patch monitor.  Your ZIO patch monitor will be mailed 3 day USPS to your address on file. It may take 3-5 days  to receive your monitor after you have been enrolled.  Once you have received your monitor, please review the enclosed instructions. Your monitor  has already been registered assigning a specific monitor serial # to you.  Billing and Patient Assistance Program Information  We have supplied [employer] with any of your insurance information on file for billing purposes. [employer] offers a sliding scale Patient Assistance Program for patients that do not have  insurance, or whose insurance does not completely cover the cost of the ZIO monitor.  You must apply for the Patient Assistance Program to qualify for this discounted rate.  To apply, please call [employer] at [phone], select option 4, select option 2, ask to apply for  Patient Assistance Program. Meredeth Ide will ask your household income, and how many people  are in your household. They will quote your out-of-pocket cost based on that information.  [employer] will also be able to set up a 68-month, interest-free payment plan if needed.  Applying  the monitor   Shave hair from upper left chest.  Hold abrader disc by orange tab. Rub abrader in 40 strokes over the upper left chest as  indicated in your monitor instructions.  Clean area with 4 enclosed alcohol pads. Let dry.  Apply patch as indicated in monitor instructions. Patch will be placed under collarbone on left  side of chest with arrow pointing upward.  Rub patch adhesive wings for 2 minutes. Remove white label marked "1". Remove the white  label marked "2". Rub patch adhesive wings for 2 additional minutes.  While looking in a mirror, press and release button in center of patch. A small green light will  flash 3-4 times. This will be your only indicator that the monitor has been turned on.  Do not shower for the first 24 hours. You may shower after the first 24 hours.  Press the button if you feel a symptom. You will hear a small click. Record Date, Time and  Symptom in the Patient Logbook.  When you are ready to remove the patch, follow instructions on the last 2 pages of Patient  Logbook. Stick patch monitor onto the last page of Patient Logbook.  Place Patient Logbook in the blue and white box. Use locking tab on box and tape box closed  securely. The blue and white box has prepaid postage on it. Please place it in the mailbox as  soon as possible. Your physician should have your test results approximately 7 days after the  monitor has been mailed back to [facility].  Call [facility]  at [phone] if you have questions regarding  your ZIO XT patch monitor. Call them immediately if you see an orange light blinking on your  monitor.  If your monitor falls off in less than 4 days, contact our Monitor department at [phone].  If your monitor becomes loose or falls off after 4 days call [employer] at [phone] for  suggestions on securing your monitor    Follow-Up: At [facility], you and your health needs are our priority.  As part  of our continuing mission to provide you with exceptional heart care, we have created designated Provider Care Teams.  These Care Teams include your primary Cardiologist (physician) and Advanced Practice Providers (APPs -  Physician Assistants and Nurse Practitioners) who all work together to provide you with the care you need, when you need it.  We recommend signing up for the patient portal called "MyChart".  Sign up information is provided on this After Visit Summary.  MyChart is used to connect with patients for Virtual Visits (Telemedicine).  Patients are able to view lab/test results, encounter notes, upcoming appointments, etc.  Non-urgent messages can be sent to your provider as well.   To learn more about what you can do with MyChart, go to [email or web address].    Your next appointment:   4 week(s)  Provider:   Micah Flesher, PA-C, Robet Leu, PA-C, Azalee Course, PA-C, or Bernadene Person, NP       Then, Nanetta Batty, MD will plan to see you again in 6 month(s).

## 2023-09-18 ENCOUNTER — Ambulatory Visit (INDEPENDENT_AMBULATORY_CARE_PROVIDER_SITE_OTHER): Payer: Medicare Other | Admitting: Internal Medicine

## 2023-09-18 ENCOUNTER — Encounter: Payer: Self-pay | Admitting: Internal Medicine

## 2023-09-18 ENCOUNTER — Other Ambulatory Visit: Payer: Self-pay | Admitting: Cardiovascular Disease

## 2023-09-18 VITALS — BP 136/82 | HR 60 | Temp 98.0°F | Ht 72.0 in | Wt 209.0 lb

## 2023-09-18 DIAGNOSIS — F3342 Major depressive disorder, recurrent, in full remission: Secondary | ICD-10-CM | POA: Diagnosis not present

## 2023-09-18 DIAGNOSIS — R55 Syncope and collapse: Secondary | ICD-10-CM | POA: Diagnosis not present

## 2023-09-18 DIAGNOSIS — H9193 Unspecified hearing loss, bilateral: Secondary | ICD-10-CM

## 2023-09-18 DIAGNOSIS — E785 Hyperlipidemia, unspecified: Secondary | ICD-10-CM | POA: Diagnosis not present

## 2023-09-18 DIAGNOSIS — H903 Sensorineural hearing loss, bilateral: Secondary | ICD-10-CM

## 2023-09-18 DIAGNOSIS — I6523 Occlusion and stenosis of bilateral carotid arteries: Secondary | ICD-10-CM | POA: Diagnosis not present

## 2023-09-18 NOTE — Patient Instructions (Addendum)
Please continue all other medications as before, and refills have been done if requested.  Please have the pharmacy call with any other refills you may need.  Please continue your efforts at being more active, low cholesterol diet, and weight control.  Please keep your appointments with your specialists as you may have planned  - Dr Allyson Sabal as planned  You will be contacted regarding the referral for: Carotid ultrasound, and Audiology

## 2023-09-18 NOTE — Progress Notes (Signed)
Patient ID: John Mccarthy, male   DOB: 1951/10/12, 71 y.o.   MRN: [id number]        Chief Complaint: follow up recent ED visit oct 11 with near syncope for unclear reason, carotid calcifications, bilateral hearing loss, hld, depression       HPI:  John Mccarthy is a 72 y.o. male here with c/o recent Ed visit oct 11 with near syncope, r/o for MI and labs, ecg, cxr, ct head essentially neg.  Did have flu shot recently oct 1.  Has new card event monitor per card starting today.  Imaging did note bilateral carotid calcifications, new for him.  Pt denies chest pain, increased sob or doe, wheezing, orthopnea, PND, increased LE swelling, palpitations, dizziness or syncope.   Pt denies polydipsia, polyuria, or new focal neuro s/s.    Pt denies fever, wt loss, night sweats, loss of appetite, or other constitutional symptoms  Denies worsening depressive symptoms, suicidal ideation, or panic.  Incidentally has ongoing persistent bilateral hearing loss, no to point of need audiology.    Wt Readings from Last 3 Encounters:  09/18/23 209 lb (94.8 kg)  09/15/23 206 lb 9.6 oz (93.7 kg)  09/12/23 209 lb 7 oz (95 kg)   BP Readings from Last 3 Encounters:  09/18/23 136/82  09/15/23 108/64  09/12/23 136/78         Past Medical History:  Diagnosis Date   BPH (benign prostatic hyperplasia)    CAD S/P percutaneous coronary angioplasty 2018, 2020   anterior STEMI twice- LAD DES   GI bleeding 2013   Hyperlipidemia    Pulmonary embolism (HCC) 06/15/2013   after achilles tendon repair- Xarelto 6 months   Skin cancer (melanoma) [facility])    Past Surgical History:  Procedure Laterality Date   ACHILLES TENDON SURGERY Right 05/18/2013   BIOPSY  08/26/2022   Procedure: BIOPSY;  Surgeon: Sherrilyn Rist, MD;  Location: [facility] ENDOSCOPY;  Service: Gastroenterology;;   CARDIAC CATHETERIZATION     CORONARY STENT INTERVENTION N/A 02/11/2017   Procedure: Coronary Stent Intervention;  Surgeon: Runell Gess, MD;   Location: [facility] INVASIVE CV LAB;  Service: Cardiovascular;  Laterality: N/A;   CORONARY/GRAFT ACUTE MI REVASCULARIZATION N/A 06/28/2019   Procedure: Coronary/Graft Acute MI Revascularization;  Surgeon: Runell Gess, MD;  Location: [facility] INVASIVE CV LAB;  Service: Cardiovascular;  Laterality: N/A;   ESOPHAGOGASTRODUODENOSCOPY  09/24/2012   Procedure: ESOPHAGOGASTRODUODENOSCOPY (EGD);  Surgeon: Shirley Friar, MD;  Location: [facility] ENDOSCOPY;  Service: Endoscopy;  Laterality: N/A;   ESOPHAGOGASTRODUODENOSCOPY (EGD) WITH PROPOFOL N/A 08/26/2022   Procedure: ESOPHAGOGASTRODUODENOSCOPY (EGD) WITH PROPOFOL;  Surgeon: Sherrilyn Rist, MD;  Location: [facility] ENDOSCOPY;  Service: Gastroenterology;  Laterality: N/A;   HAND SURGERY     LEFT HEART CATH AND CORONARY ANGIOGRAPHY N/A 02/11/2017   Procedure: Left Heart Cath and Coronary Angiography;  Surgeon: Runell Gess, MD;  Location: [facility] INVASIVE CV LAB;  Service: Cardiovascular;  Laterality: N/A;   LEFT HEART CATH AND CORONARY ANGIOGRAPHY N/A 06/28/2019   Procedure: LEFT HEART CATH AND CORONARY ANGIOGRAPHY;  Surgeon: Runell Gess, MD;  Location: [facility] INVASIVE CV LAB;  Service: Cardiovascular;  Laterality: N/A;   NOSE SURGERY  08/11/2019   removal of melenoma     reports that he has never smoked. He has never used smokeless tobacco. He reports current alcohol use of about 5.0 standard drinks of alcohol per week. He reports that he does not use drugs. family history includes Coronary artery disease (  age of onset: 36) in his sister; Heart failure in his father. Allergies  Allergen Reactions   Crestor [Rosuvastatin Calcium] Other (See Comments)    Muscle aches   Lactose Intolerance (Gi) Diarrhea   Current Outpatient Medications on File Prior to Visit  Medication Sig Dispense Refill   atorvastatin (LIPITOR) 80 MG tablet TAKE 1 TABLET BY MOUTH EVERY DAY AT 6 PM 90 tablet 3   carvedilol (COREG) 6.25 MG tablet TAKE 1 TABLET BY MOUTH 2 TIMES DAILY WITH A MEAL.  180 tablet 3   clopidogrel (PLAVIX) 75 MG tablet TAKE 1 TABLET BY MOUTH EVERY DAY 90 tablet 3   NON FORMULARY Take 1 Scoop by mouth every other day.     NON FORMULARY Take 1 Scoop by mouth 2 (two) times daily.     OVER THE COUNTER MEDICATION Take 1 capsule by mouth 2 (two) times daily.     sertraline (ZOLOFT) 50 MG tablet Take 1 tablet (50 mg total) by mouth daily. 90 tablet 1   nitroGLYCERIN (NITROSTAT) 0.4 MG SL tablet Place 1 tablet (0.4 mg total) under the tongue every 5 (five) minutes as needed for chest pain. (Patient not taking: Reported on 09/15/2023) 25 tablet 3   No current facility-administered medications on file prior to visit.        ROS:  All others reviewed and negative.  Objective        PE:  BP 136/82 (BP Location: Left Arm, Patient Position: Sitting, Cuff Size: Normal)   Pulse 60   Temp 98 F (36.7 C) (Oral)   Ht 6' (1.829 m)   Wt 209 lb (94.8 kg)   SpO2 98%   BMI 28.35 kg/m                 Constitutional: Pt appears in NAD               HENT: Head: NCAT.                Right Ear: External ear normal.                 Left Ear: External ear normal.                Eyes: . Pupils are equal, round, and reactive to light. Conjunctivae and EOM are normal               Nose: without d/c or deformity               Neck: Neck supple. Gross normal ROM               Cardiovascular: Normal rate and regular rhythm.                 Pulmonary/Chest: Effort normal and breath sounds without rales or wheezing.                Abd:  Soft, NT, ND, + BS, no organomegaly               Neurological: Pt is alert. At baseline orientation, motor grossly intact               Skin: Skin is warm. No rashes, no other new lesions, LE edema - none               Psychiatric: Pt behavior is normal without agitation   Micro: none  Cardiac tracings I have personally interpreted today:  none  Pertinent Radiological findings (  summarize): none   Lab Results  Component Value Date   WBC 8.5  09/12/2023   HGB 15.3 09/12/2023   HCT 47.1 09/12/2023   PLT 216 09/12/2023   GLUCOSE 117 (H) 09/12/2023   CHOL 115 09/02/2023   TRIG 127.0 09/02/2023   HDL 43.70 09/02/2023   LDLDIRECT 146.0 10/15/2016   LDLCALC 46 09/02/2023   ALT 21 09/02/2023   AST 23 09/02/2023   NA 141 09/12/2023   K 4.0 09/12/2023   CL 105 09/12/2023   CREATININE 1.14 09/12/2023   BUN 14 09/12/2023   CO2 25 09/12/2023   TSH 2.98 09/02/2023   PSA 0.89 09/02/2023   INR 1.1 08/25/2022   HGBA1C 5.5 10/15/2016   Assessment/Plan:  ELYSHA URREGO is a 72 y.o. White or Caucasian [1] male with  has a past medical history of BPH (benign prostatic hyperplasia), CAD S/P percutaneous coronary angioplasty (2018, 2020), GI bleeding (2013), Hyperlipidemia, Pulmonary embolism (HCC) (06/15/2013), and Skin cancer (melanoma) (HCC).  Bilateral sensorineural hearing loss Also for Audiology referral  Dyslipidemia, goal LDL below 70 Lab Results  Component Value Date   LDLCALC 46 09/02/2023   Stable, pt to continue current statin lipitor 80 mg qd   Near syncope Etiology unclear, pt now for cardiac monitor , f/u cardiology as planned  Recurrent major depressive disorder, in full remission (HCC) Stable overall, decline need for change in tx or new counseling referral  Followup: Return if symptoms worsen or fail to improve.  Oliver Barre, MD 09/21/2023 3:22 PM [facility]  Primary Care - [facility] Internal Medicine

## 2023-09-19 DIAGNOSIS — R55 Syncope and collapse: Secondary | ICD-10-CM | POA: Diagnosis not present

## 2023-09-21 ENCOUNTER — Encounter: Payer: Self-pay | Admitting: Internal Medicine

## 2023-09-21 DIAGNOSIS — R55 Syncope and collapse: Secondary | ICD-10-CM | POA: Insufficient documentation

## 2023-09-21 DIAGNOSIS — I6523 Occlusion and stenosis of bilateral carotid arteries: Secondary | ICD-10-CM | POA: Insufficient documentation

## 2023-09-21 NOTE — Assessment & Plan Note (Signed)
Etiology unclear, pt now for cardiac monitor , f/u cardiology as planned

## 2023-09-21 NOTE — Assessment & Plan Note (Signed)
Stable overall, decline need for change in tx or new counseling referral

## 2023-09-21 NOTE — Assessment & Plan Note (Signed)
Also for Audiology referral

## 2023-09-21 NOTE — Assessment & Plan Note (Signed)
Lab Results  Component Value Date   LDLCALC 46 09/02/2023   Stable, pt to continue current statin lipitor 80 mg qd

## 2023-09-23 ENCOUNTER — Ambulatory Visit: Payer: BLUE CROSS/BLUE SHIELD | Admitting: Cardiovascular Disease

## 2023-10-02 DIAGNOSIS — R55 Syncope and collapse: Secondary | ICD-10-CM | POA: Diagnosis not present

## 2023-10-10 ENCOUNTER — Ambulatory Visit (HOSPITAL_COMMUNITY)
Admission: RE | Admit: 2023-10-10 | Discharge: 2023-10-10 | Disposition: A | Discharge status: Home / Self Care | Payer: Medicare Other | Source: Ambulatory Visit | Attending: Internal Medicine | Admitting: Internal Medicine

## 2023-10-10 DIAGNOSIS — I6523 Occlusion and stenosis of bilateral carotid arteries: Secondary | ICD-10-CM | POA: Diagnosis not present

## 2023-10-13 ENCOUNTER — Ambulatory Visit: Payer: Medicare Other | Attending: Physician Assistant | Admitting: Physician Assistant

## 2023-10-13 ENCOUNTER — Telehealth: Payer: Self-pay

## 2023-10-13 VITALS — BP 110/60 | HR 65 | Ht 72.0 in | Wt 206.0 lb

## 2023-10-13 DIAGNOSIS — I2510 Atherosclerotic heart disease of native coronary artery without angina pectoris: Secondary | ICD-10-CM

## 2023-10-13 DIAGNOSIS — R55 Syncope and collapse: Secondary | ICD-10-CM | POA: Diagnosis not present

## 2023-10-13 DIAGNOSIS — E785 Hyperlipidemia, unspecified: Secondary | ICD-10-CM

## 2023-10-13 DIAGNOSIS — I255 Ischemic cardiomyopathy: Secondary | ICD-10-CM | POA: Diagnosis not present

## 2023-10-13 MED ORDER — NITROGLYCERIN 0.4 MG SL SUBL: 0.4000 mg | SUBLINGUAL_TABLET | SUBLINGUAL | 3 refills | Status: AC | PRN

## 2023-10-13 NOTE — Telephone Encounter (Signed)
Transition Care Management Unsuccessful Follow-up Telephone Call  Date of discharge and from where:  09/12/2023 [facility]  Attempts:  1st Attempt  Reason for unsuccessful TCM follow-up call:  Left voice message  Tam Savoia [facility]  [facility] Resource Care Guide Direct Dial: [phone]  Website: [email or web address]

## 2023-10-13 NOTE — Progress Notes (Signed)
Cardiology Office Note:  .   Date:  10/13/2023  ID:  [name], DOB [birth year], MRN [id number] PCP: Etta Grandchild, MD  [facility] Providers Cardiologist:  Nanetta Batty, MD    History of Present Illness: .   John Mccarthy is a 72 y.o. male with PMH of CAD, ischemic cardiomyopathy, syncope and hyperlipidemia.  Patient had anterior STEMI on 02/11/2017.  Cardiac catheterization revealed ostial LAD occlusion treated with Synergy DES, 60% proximal to mid abdominal RCA stenosis that was treated medically.  EF 50% with anterior apical wall motion abnormality.  Echocardiogram performed in July 2018 showed normal EF.  He was placed on aspirin and Plavix.  He had recurrent anterior STEMI with occlusion of previously placed stent in July 2020 and treated with 3 additional stents to his proximal LAD.  He did have 99% ostial D1 lesion which was a jailed vessel, 70% mid RCA lesion, EF 45 to 50% with anteroapical hypokinesis.  She was admitted with upper GI bleeding in September 2023 and underwent endoscopy that revealed ulcer.  Hemoglobin was down to 6 however improved since.  Aspirin discontinued.  He remains on Plavix monotherapy.  He was last seen by Dr. Gery Pray on 09/15/2023, 3 days after he had a syncopal episode and was wheeled in the emergency room.  He was orthostatic.  Symptom he responded to IV hydration.  1 week ZIO monitor was recommended which showed minimal heart rate 49, maximal heart rate 121, average heart rate 69 bpm.  Predominant rhythm was sinus rhythm, first-degree AV block was present.  1 run of VT lasting only 4 beats with a maximal heart rate of 121.  1 run of SVT lasted 7 beats.  PVC burden less than 1%.  No significant arrhythmia to explain passing out spell.  Carotid Doppler obtained on 10/10/2023 showed mild carotid disease bilaterally, significant decrease in velocity in the right vertebral artery with head turning motion, bilateral vertebral artery showed adequate  blood flow, normal flow in bilateral subclavian arteries.  Patient presents today for follow-up.  He denies any chest pain or shortness of breath.  The dizziness has significantly improved.  We reviewed the heart monitor report, despite multiple patient triggered events, underlying rhythm all showed normal sinus rhythm.  PVC burden less than 1%.  Given reassuring heart monitor report, I do not recommend any further workup.  He can follow-up with Dr. Allyson Sabal in 48-month.  Recent cholesterol is very well-controlled.  ROS:   He denies chest pain, palpitations, dyspnea, pnd, orthopnea, n, v, syncope, edema, weight gain, or early satiety. All other systems reviewed and are otherwise negative except as noted above.   He still occasionally have dizziness, not orthostatic in nature, happening only for few seconds at a time.  Studies Reviewed: .        Cardiac Studies & Procedures   CARDIAC CATHETERIZATION  CARDIAC CATHETERIZATION 06/28/2019  Narrative Images from the original result were not included.   The left ventricular systolic function is normal.  LV end diastolic pressure is normal.  The left ventricular ejection fraction is 50-55% by visual estimate.  Prox RCA lesion is 70% stenosed.  Prox Cx lesion is 40% stenosed.  Ost LAD to Prox LAD lesion is 100% stenosed.  A stent was successfully placed.  Post intervention, there is a 0% residual stenosis.  1st Diag lesion is 99% stenosed.  John Mccarthy is a 72 y.o. male   [id number] LOCATION:  FACILITY: [facility] PHYSICIAN: Nanetta Batty, M.D.  Mar 14, 1951   DATE OF PROCEDURE:  06/28/2019  DATE OF DISCHARGE:     CARDIAC CATHETERIZATION / PCI DES LAD    History obtained from chart review.John Mccarthy is a 72y.o.  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 I last saw him in the office 04/18/17. He had anterior STEMI  on 02/11/17. He  was brought to the cath lab emergently and radial cath by myself revealing an ostial LAD occlusion which I stented with a synergy (3 mm x 12 mm) drug-eluting stent. He did have a 60% proximal to mid dominant RCA stenosis which I elected to treat medically. EF was 50% with anteroapical wall motion abnormality. Follow-up 2-D echocardiogram performed 06/24/17 revealed normalization of LV function. He was on dual therapy including aspirin Brilenta, currently only on aspirin, he was playing pickle ball this afternoon developed substernal chest pain.  EMS was called.  His EKG showed anterior ST segment elevation.  Is brought urgently to the ER and then up to the Cath Lab for angiography and intervention.   PROCEDURE DESCRIPTION:  The patient was brought to the second floor [facility] cath lab in the postabsorptive state. He was not premedicated . His right wrist and groin Were prepped and shaved in usual sterile fashion. Xylocaine 1% was used for local anesthesia. A 6 French sheath was inserted into the right radial artery using standard Seldinger technique.  A 6 French sheath was inserted to the right common femoral artery using standard Seldinger technique.  Omnipaque dye was used for the entirety of the case.  Retrograde aorta, ventricular and pullback pressures were recorded.  Radial cocktail was administered via the SideArm sheath.  I was unable to engage the left main using an XB LAD 3.5 and an EBU 3.5 via the right radial artery and therefore defaulted to the femoral approach.  Using a 6 [place] XB LAD 3.5 guide catheter along with a 0.14 pro-water guidewire I was able to cross the proximal LAD without much difficulty with a door to balloon time of 46 minutes (delayed because of alternative access).  I ballooned the proximal LAD with a 2.0 x 12 mm long balloon next establishing antegrade flow.  The entire proximal segment was diseased.  I then placed a 2.5 x 24 mm Synergy drug-eluting stent in the  proximal LAD, 3.0 x 8 in the ostium and 2.75 x 8 in the distal end edge of the stent post dilating the proximal two thirds with a 3 mm balloon in the distal one third with a 2.575 mm balloon resulting reduction mature occlusion to 0% sent residual with TIMI-3 flow.  Unfortunately, I did lose the first large diagonal branch.  The patient received 11,500 units of heparin with an ACT of 263 at the end of the case.  Total contrast administered to the patient was 230 cc.  He did become hypotensive with a reperfusion arrhythmia requiring initiation of Levophed for blood pressure support as well as fluid resuscitation.  He transiently went into a slow V. tach which he did not hemodynamically tolerate and received amiodarone bolus followed by infusion.  At the end of the case he was hemodynamically stable with a blood pressure of 130/80.  Impression Successful PCI drug-eluting stenting of an occluded proximal LAD in the setting of an anterior STEMI with a door to balloon time of 46 minutes delayed because of requiring alternative access with transient hypotension and  ventricular tachyarrhythmias appropriately treated with pressors and antiarrhythmic drugs.  He received Brilinta 180 mg p.o. and will need to be on dual antiplatelet therapy uninterrupted for 12 months.  The radial sheath was removed and a TR band was placed.  The femoral sheath was sewn securely in place.  Nanetta Batty. MD, [facility] 06/28/2019 10:44 PM  Findings Coronary Findings Diagnostic  Dominance: Right  Left Anterior Descending Ost LAD to Prox LAD lesion is 100% stenosed. Vessel is the culprit lesion. The lesion is thrombotic. The lesion was previously treated.  First Diagonal Branch 1st Diag lesion is 99% stenosed.  Left Circumflex Prox Cx lesion is 40% stenosed.  Right Coronary Artery Prox RCA lesion is 70% stenosed.  Intervention  Ost LAD to Prox LAD lesion Stent A stent was successfully placed. Post-Intervention Lesion  Assessment The intervention was successful. Pre-interventional TIMI flow is 0. Post-intervention TIMI flow is 3. No complications occurred at this lesion. There is a 0% residual stenosis post intervention.   CARDIAC CATHETERIZATION  CARDIAC CATHETERIZATION 02/11/2017  Narrative Images from the original result were not included.   Prox RCA to Mid RCA lesion, 60 %stenosed.  There is moderate left ventricular systolic dysfunction.  LV end diastolic pressure is mildly elevated.  The left ventricular ejection fraction is 45-50% by visual estimate.  Ost LAD to Prox LAD lesion, 100 %stenosed.  Post intervention, there is a 0% residual stenosis.  A stent was successfully placed.  KINGSTYN HOLAWAY is a 72 y.o. male   [id number] LOCATION:  FACILITY: [facility] PHYSICIAN: Nanetta Batty, M.D. [birth year]   DATE OF PROCEDURE:  02/11/2017  DATE OF DISCHARGE:     CARDIAC CATHETERIZATION / PCI- DES Prox LAD    History obtained from chart review. Mr Dahman is a 72 year old mild to moderately overweight married Caucasian male without prior cardiac history who was playing doubles tennis this morning developed substernal chest pain. EMS was called and this EKG was consistent with anterior wall myocardial infarction. He was transported to the emergency room and brought emergently to the Cath Lab for angiography and potential intervention.   PROCEDURE DESCRIPTION:  The patient was brought to the second floor [place] [facility]iac cath lab in the postabsorptive state. He was not premedicated . His right wrist was prepped and shaved in usual sterile fashion. Xylocaine 1% was used for local anesthesia. A 6 French sheath was inserted into the right radial artery using standard Seldinger technique. The patient received  4000 units  of heparin  intravenously.  A 5 [place] TIG catheter and pigtail catheter were used for selective coronary angiography and left ventriculography respectively. Isovue  dye was used for the entirety of the case. Retrograde aortic, left ventricular and pullback pressures were recorded. Radial cocktail was administered via the SideArm sheath.  The patient received 180 mg by mouth Brilenta as well as Angiomax bolus followed by infusion with a therapeutic ACT demonstrated. A 6 [place] XB LAD 3.5 cm guide catheter along with a 0.14 Pro water guidewire and a 2 mm x 12 mm balloon the proximal LAD occlusion was crossed fairly easily and predilated with a door to balloon time of 21 minutes. Following this a 3 mm x 12 mm long synergy drug eluding stent was then carefully positioned across the proximal LAD occlusion and deployed at 14 atm. It was postdilated with a 3.25 mm x 8 mm long compliant balloon up to 16 atm (3.3 mm) resulting in reduction of a total occlusion to 0% residual. There was 40-50%  stenosis just beyond this after a moderate size first diagonal branch.  The patient did have a 60% proximal dominant RCA stenosis will be treated medically at this time. The guidewire and catheter were removed. The sheath was removed and a TR band was placed on the right wrist to achieve patent hemostasis. The patient left the lab in stable condition. Angiomax will continue at full dose for 4 hours.  Impression Successful proximal LAD PCI and drug-eluting stenting in the setting of an anterior STEMI with a door to balloon time of 21 minutes. His EF was approximately 45% with moderate anteroapical hypokinesia. He does have a moderate proximal dominant RCA stenosis that does not appear to be physiologically significant and will be treated medically at this time. He will be treated with fiber therapy for at least 1 year as well as beta blocker and high-dose statin therapy.  Nanetta Batty. MD, [facility] 02/11/2017 12:27 PM  Findings Coronary Findings Diagnostic  Dominance: Right  Left Anterior Descending  Right Coronary Artery  Intervention  Ost LAD to Prox LAD lesion Angioplasty A  stent was successfully placed. There is a 0% residual stenosis post intervention.     ECHOCARDIOGRAM  ECHOCARDIOGRAM COMPLETE 12/08/2019  Narrative ECHOCARDIOGRAM REPORT    Patient Name:   VONTE IRENE [facility] Date of Exam: 12/08/2019 Medical Rec #:  [id number]           Height:       72.0 in Accession #:    [id number]          Weight:       199.0 lb Date of Birth:  February 14, 1951           BSA:          2.13 m Patient Age:    68 years            BP:           118/64 mmHg Patient Gender: M                   HR:           60 bpm. Exam Location:  [address]  Procedure: 2D Echo, Cardiac Doppler and Color Doppler  Indications:    I51.9 Decreased LV function; I25.5 Ischemic cardiomyopathy  History:        Patient has prior history of Echocardiogram examinations, most recent 06/29/2019. Previous Myocardial Infarction and CAD; Risk Factors:Dyslipidemia. S/P angioplasty.  Sonographer:    Cathie Beams RCS Referring Phys: [phone] JONATHAN J BERRY  IMPRESSIONS   1. EF has improved to low-normal, 50-55%. WMA has improved. 2. Left ventricular ejection fraction, by visual estimation, is 50 to 55%. The left ventricle has mildly decreased function. There is no left ventricular hypertrophy. 3. Elevated left atrial pressure. 4. Left ventricular diastolic parameters are consistent with Grade II diastolic dysfunction (pseudonormalization). 5. The left ventricle has no regional wall motion abnormalities. 6. Global right ventricle has normal systolic function.The right ventricular size is normal. No increase in right ventricular wall thickness. 7. Left atrial size was mildly dilated. 8. The mitral valve is grossly normal. Mild mitral valve regurgitation. 9. The tricuspid valve is grossly normal. 10. The aortic valve is tricuspid. Aortic valve regurgitation is not visualized. No evidence of aortic valve sclerosis or stenosis. 11. Mildly elevated pulmonary artery systolic pressure. 12. The tricuspid  regurgitant velocity is 2.85 m/s, and with an assumed right atrial pressure of 10 mmHg, the estimated right ventricular systolic pressure is mildly elevated  at 42.5 mmHg. 13. A prior study was performed on 06/29/2019. 14. Changes from prior study are noted. 15. EF slightly improved 50-55%. WMA has improved.  FINDINGS Left Ventricle: Left ventricular ejection fraction, by visual estimation, is 50 to 55%. The left ventricle has mildly decreased function. The left ventricle has no regional wall motion abnormalities. The left ventricular internal cavity size was the left ventricle is normal in size. There is no left ventricular hypertrophy. Left ventricular diastolic parameters are consistent with Grade II diastolic dysfunction (pseudonormalization). Elevated left atrial pressure. EF has improved to low-normal, 50-55%. WMA has improved.  Right Ventricle: The right ventricular size is normal. No increase in right ventricular wall thickness. Global RV systolic function is has normal systolic function. The tricuspid regurgitant velocity is 2.85 m/s, and with an assumed right atrial pressure of 10 mmHg, the estimated right ventricular systolic pressure is mildly elevated at 42.5 mmHg.  Left [facility]: Left atrial size was mildly dilated.  Right [facility]: Right atrial size was normal in size  Pericardium: There is no evidence of pericardial effusion. Presence of pericardial fat pad.  Mitral Valve: The mitral valve is grossly normal. Mild mitral annular calcification. Mild mitral valve regurgitation.  Tricuspid Valve: The tricuspid valve is grossly normal. Tricuspid valve regurgitation is trivial.  Aortic Valve: The aortic valve is tricuspid. Aortic valve regurgitation is not visualized. The aortic valve is structurally normal, with no evidence of sclerosis or stenosis. There is mild calcification of the aortic valve.  Pulmonic Valve: The pulmonic valve was grossly normal. Pulmonic valve regurgitation is  not visualized. Pulmonic regurgitation is not visualized.  Aorta: The aortic root and ascending aorta are structurally normal, with no evidence of dilitation.  IAS/Shunts: No atrial level shunt detected by color flow Doppler.  Additional Comments: A prior study was performed on 06/29/2019.  LEFT VENTRICLE PLAX 2D LVIDd:         5.38 cm  Diastology LVIDs:         3.56 cm  LV e' lateral:   8.49 cm/s LV PW:         1.22 cm  LV E/e' lateral: 8.4 LV IVS:        1.07 cm  LV e' medial:    7.51 cm/s LVOT diam:     2.00 cm  LV E/e' medial:  9.5 LV SV:         87 ml LV SV Index:   40.43 LVOT Area:     3.14 cm   RIGHT VENTRICLE RV Basal diam:  2.07 cm RV S prime:     11.30 cm/s TAPSE (M-mode): 1.9 cm RVSP:           35.5 mmHg  LEFT ATRIUM             Index       RIGHT ATRIUM           Index LA diam:        4.60 cm 2.16 cm/m  RA Pressure: 3.00 mmHg LA Vol (A2C):   73.3 ml 34.48 ml/m RA Area:     12.40 cm LA Vol (A4C):   76.1 ml 35.79 ml/m RA Volume:   27.10 ml  12.75 ml/m LA Biplane Vol: 78.1 ml 36.74 ml/m AORTIC VALVE LVOT Vmax:   76.90 cm/s LVOT Vmean:  49.400 cm/s LVOT VTI:    0.180 m  AORTA Ao Root diam: 3.40 cm  MITRAL VALVE  TRICUSPID VALVE MV Area (PHT): 3.72 cm             TR Peak grad:   32.5 mmHg MV PHT:        59.16 msec           TR Vmax:        285.00 cm/s MV Decel Time: 204 msec             Estimated RAP:  3.00 mmHg MV E velocity: 71.30 cm/s 103 cm/s  RVSP:           35.5 mmHg MV A velocity: 44.10 cm/s 70.3 cm/s MV E/A ratio:  1.62       1.5       SHUNTS Systemic VTI:  0.18 m Systemic Diam: 2.00 cm   Lennie Odor MD Electronically signed by Lennie Odor MD Signature Date/Time: 12/08/2019/11:42:18 AM    Final    MONITORS  LONG TERM MONITOR (3-14 DAYS) 10/02/2023  Narrative Patch Wear Time:  7 days and 22 hours (2024-10-18T09:19:48-0400 to 2024-10-26T08:09:18-0400)  Patient had a min HR of 49 bpm, max HR of 121 bpm, and avg  HR of 69 bpm. Predominant underlying rhythm was Sinus Rhythm. First Degree AV Block was present. 1 run of Ventricular Tachycardia occurred lasting 4 beats with a max rate of 121 bpm (avg 103 bpm). 1 run of Supraventricular Tachycardia occurred lasting 7 beats with a max rate of 102 bpm (avg 91 bpm). Isolated SVEs were rare (<1.0%), SVE Couplets were rare (<1.0%), and SVE Triplets were rare (<1.0%). Isolated VEs were rare (<1.0%), VE Couplets were rare (<1.0%), and no VE Triplets were present.  SR/SB/ST Occasional PACs/PVCs Short runs of SVT           Risk Assessment/Calculations:            Physical Exam:   VS:  BP 110/60 (BP Location: Left Arm, Patient Position: Sitting, Cuff Size: Normal)   Pulse 65   Ht 6' (1.829 m)   Wt 206 lb (93.4 kg)   SpO2 95%   BMI 27.94 kg/m    Wt Readings from Last 3 Encounters:  10/13/23 206 lb (93.4 kg)  09/18/23 209 lb (94.8 kg)  09/15/23 206 lb 9.6 oz (93.7 kg)    GEN: Well nourished, well developed in no acute distress NECK: No JVD; No carotid bruits CARDIAC: RRR, no murmurs, rubs, gallops RESPIRATORY:  Clear to auscultation without rales, wheezing or rhonchi  ABDOMEN: Soft, non-tender, non-distended EXTREMITIES:  No edema; No deformity   ASSESSMENT AND PLAN: .    Syncope Recurrent episodes of syncope. No significant arrhythmias on Zio monitor to explain syncope. Carotid Doppler showed mild disease bilaterally with no severe obstruction. No intracranial etiology on CT. -No further cardiac workup recommended.  CAD: Denies any recent chest discomfort.  Continue Plavix monotherapy.  Cardiomyopathy History of anterior STEMI with subsequent stent occlusions and re-stenting. Currently stable with no chest pain or shortness of breath.  Euvolemic on exam -Continue current management.  Hyperlipidemia Well controlled on current management. -Continue current management.         Dispo: Follow-up in 6 months  Signed, Azalee Course, [place]

## 2023-10-13 NOTE — Patient Instructions (Signed)
Medication Instructions:  NO CHANGES *If you need a refill on your cardiac medications before your next appointment, please call your pharmacy*   Lab Work: NO LABS If you have labs (blood work) drawn today and your tests are completely normal, you will receive your results only by: MyChart Message (if you have MyChart) OR A paper copy in the mail If you have any lab test that is abnormal or we need to change your treatment, we will call you to review the results.   Testing/Procedures: NO TESTING   Follow-Up: At [facility], you and your health needs are our priority.  As part of our continuing mission to provide you with exceptional heart care, we have created designated Provider Care Teams.  These Care Teams include your primary Cardiologist (physician) and Advanced Practice Providers (APPs -  Physician Assistants and Nurse Practitioners) who all work together to provide you with the care you need, when you need it.   Your next appointment:   6 month(s)  Provider:   Nanetta Batty, MD

## 2023-10-14 ENCOUNTER — Telehealth: Payer: Self-pay

## 2023-10-14 NOTE — Telephone Encounter (Signed)
Transition Care Management Unsuccessful Follow-up Telephone Call  Date of discharge and from where:  09/12/2023 [facility]  Attempts:  2nd Attempt  Reason for unsuccessful TCM follow-up call:  Left voice message  Tylan Briguglio [facility]  [facility], [facility] Resource Care Guide Direct Dial: [phone]  Website: [email or web address]

## 2023-12-02 ENCOUNTER — Ambulatory Visit: Payer: Medicare Other | Attending: Internal Medicine | Admitting: Audiologist

## 2023-12-02 DIAGNOSIS — H903 Sensorineural hearing loss, bilateral: Secondary | ICD-10-CM | POA: Diagnosis not present

## 2023-12-02 NOTE — Procedures (Signed)
  Outpatient Audiology and [facility] [address] [place], [place]  [address] [phone]  AUDIOLOGICAL  EVALUATION  NAME: John Mccarthy     DOB:   [birth year]      MRN: [id number]                                                                                     DATE: 12/02/2023     REFERENT: Joshua Debby CROME, MD STATUS: Outpatient DIAGNOSIS: Sensorineural Hearing Loss Bilateral    History: John Mccarthy was seen for an audiological evaluation due to hearing loss in both ears. He feels his hearing is just a little muffled. He does not notice it often or feel it impacts his daily life.  John Mccarthy denies pain, pressure, or tinnitus.  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 Note from 2021 by audiologist Dr. 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 Hearing aids pending medical clearance recommended. Seen by Dr. Arlana and cleared for aids in both ears.    Evaluation:  Otoscopy showed a clear view of the tympanic membranes, bilaterally Tympanometry results were consistent with normal middle ear function bilaterally   Audiometric testing was completed using Conventional Audiometry techniques with insert earphones and supraural headphones. Test results are consistent with sloping sensorineural symmetric hearing loss bilaterally. Left ear is now symmetric with right, no further progression of loss. Speech Recognition Thresholds were obtained at 30dB HL in the right ear and at 35dB HL in the left ear. Word Recognition Testing was completed at  40dB SL and John Mccarthy scored 92% in each ear.   Results:  The test results were reviewed with John Mccarthy. He has a symmetric sensorineural mild sloping to moderately severe hearing  loss. Consistent with age related changes. He is not interested in pursuing at this time.  Audiogram printed and provided to Danielle.   Recommendations: Hearing aids recommended for both ears once ready for daily use, patient not there at this time. Annual audiometric testing recommended to monitor hearing loss for progression.   33 minutes spent testing and counseling on results.   If you have any questions please feel free to contact me at [phone].  Lauraine Ka Au.D.  Audiologist   12/02/2023  7:58 AM  Cc: Joshua Debby CROME, MD

## 2023-12-04 ENCOUNTER — Other Ambulatory Visit: Payer: Self-pay | Admitting: Cardiovascular Disease

## 2023-12-05 ENCOUNTER — Ambulatory Visit (INDEPENDENT_AMBULATORY_CARE_PROVIDER_SITE_OTHER): Payer: Medicare Other | Admitting: Podiatry

## 2023-12-05 ENCOUNTER — Encounter: Payer: Self-pay | Admitting: Podiatry

## 2023-12-05 DIAGNOSIS — B351 Tinea unguium: Secondary | ICD-10-CM | POA: Diagnosis not present

## 2023-12-05 DIAGNOSIS — M79675 Pain in left toe(s): Secondary | ICD-10-CM

## 2023-12-05 DIAGNOSIS — L84 Corns and callosities: Secondary | ICD-10-CM | POA: Diagnosis not present

## 2023-12-05 DIAGNOSIS — M79674 Pain in right toe(s): Secondary | ICD-10-CM | POA: Diagnosis not present

## 2023-12-05 DIAGNOSIS — Z7901 Long term (current) use of anticoagulants: Secondary | ICD-10-CM

## 2023-12-05 NOTE — Progress Notes (Signed)
 Subjective: Chief Complaint  Patient presents with   Callouses    RM#13 Patient states he is here to have calluses shaved down on both feet.    73 year old male presents the office with above concerns.  States the calluses started to cause discomfort.  No swelling redness or any drainage.  Also his nails are elongated cannot trim them himself causing discomfort he is also on Plavix .  Objective: AAO x3, NAD DP/PT pulses palpable bilaterally, CRT less than 3 seconds Nails are hypertrophic, dystrophic, brittle, discolored, elongated 10. No surrounding redness or drainage. Tenderness nails 1-5 bilaterally.  Hyperkeratotic lesions to the bilateral submetatarsal 5 right side worse than left without any underlying ulceration, drainage or any signs of infection today.  Prominent metatarsal heads plantarly. No pain with calf compression, swelling, warmth, erythema  Assessment: 73 year old male with hyperkeratotic lesions and onychomycosis on Plavix   Plan: -All treatment options discussed with the patient including all alternatives, risks, complications.  -She will be debrided nails x 10 without any complications or bleeding.  Discussed topical antifungal medication. -Sharply debrided the hyperkeratotic lesions x 2 without any complications or bleeding.  Moisturizer, offloading. -Patient encouraged to call the office with any questions, concerns, change in symptoms.   No follow-ups on file.   Donnice JONELLE Fees DPM

## 2023-12-22 ENCOUNTER — Ambulatory Visit: Payer: BLUE CROSS/BLUE SHIELD | Admitting: Podiatry

## 2024-02-25 ENCOUNTER — Ambulatory Visit (INDEPENDENT_AMBULATORY_CARE_PROVIDER_SITE_OTHER): Payer: BLUE CROSS/BLUE SHIELD

## 2024-02-25 VITALS — BP 128/70 | HR 74 | Ht 72.0 in | Wt 216.2 lb

## 2024-02-25 DIAGNOSIS — Z0000 Encounter for general adult medical examination without abnormal findings: Secondary | ICD-10-CM | POA: Diagnosis not present

## 2024-02-25 NOTE — Progress Notes (Signed)
 Subjective:   John Mccarthy is a 73 y.o. who presents for a Medicare Wellness preventive visit.  Visit Complete: In person  Persons Participating in Visit: Patient.  AWV Questionnaire: Yes: Patient Medicare AWV questionnaire was completed by the patient on 02/25/2024; I have confirmed that all information answered by patient is correct and no changes since this date.        Objective:    Today's Vitals   02/25/24 1435  BP: 128/70  Pulse: 74  Weight: 216 lb 3.2 oz (98.1 kg)  Height: 6' (1.829 m)   Body mass index is 29.32 kg/m.     02/25/2024    2:34 PM 09/12/2023   10:28 AM 02/24/2023   11:35 AM 08/27/2022    2:00 PM 08/25/2022    4:56 PM 02/22/2022    1:42 PM 02/07/2021    8:27 AM  Advanced Directives  Does Patient Have a Medical Advance Directive? No No No No No No No  Would patient like information on creating a medical advance directive? No - Patient declined No - Patient declined No - Patient declined   No - Patient declined No - Patient declined    Current Medications (verified) Outpatient Encounter Medications as of 02/25/2024  Medication Sig   atorvastatin (LIPITOR) 80 MG tablet TAKE 1 TABLET BY MOUTH EVERY DAY AT 6 PM   carvedilol (COREG) 6.25 MG tablet TAKE 1 TABLET BY MOUTH 2 TIMES DAILY WITH A MEAL.   clopidogrel (PLAVIX) 75 MG tablet TAKE 1 TABLET BY MOUTH EVERY DAY   NON FORMULARY Take 1 Scoop by mouth every other day.   NON FORMULARY Take 1 Scoop by mouth 2 (two) times daily.   OVER THE COUNTER MEDICATION Take 1 capsule by mouth 2 (two) times daily.   sertraline (ZOLOFT) 50 MG tablet Take 1 tablet (50 mg total) by mouth daily.   nitroGLYCERIN (NITROSTAT) 0.4 MG SL tablet Place 1 tablet (0.4 mg total) under the tongue every 5 (five) minutes as needed for chest pain.   No facility-administered encounter medications on file as of 02/25/2024.    Allergies (verified) Crestor [rosuvastatin calcium] and Lactose intolerance (gi)   History: Past Medical  History:  Diagnosis Date   BPH (benign prostatic hyperplasia)    CAD S/P percutaneous coronary angioplasty 2018, 2020   anterior STEMI twice- LAD DES   GI bleeding 2013   Hyperlipidemia    Pulmonary embolism (HCC) 06/15/2013   after achilles tendon repair- Xarelto 6 months   Skin cancer (melanoma) [facility])    Past Surgical History:  Procedure Laterality Date   ACHILLES TENDON SURGERY Right 05/18/2013   BIOPSY  08/26/2022   Procedure: BIOPSY;  Surgeon: Sherrilyn Rist, MD;  Location: [facility] ENDOSCOPY;  Service: Gastroenterology;;   CARDIAC CATHETERIZATION     CORONARY STENT INTERVENTION N/A 02/11/2017   Procedure: Coronary Stent Intervention;  Surgeon: Runell Gess, MD;  Location: [facility] INVASIVE CV LAB;  Service: Cardiovascular;  Laterality: N/A;   CORONARY/GRAFT ACUTE MI REVASCULARIZATION N/A 06/28/2019   Procedure: Coronary/Graft Acute MI Revascularization;  Surgeon: Runell Gess, MD;  Location: [facility] INVASIVE CV LAB;  Service: Cardiovascular;  Laterality: N/A;   ESOPHAGOGASTRODUODENOSCOPY  09/24/2012   Procedure: ESOPHAGOGASTRODUODENOSCOPY (EGD);  Surgeon: Shirley Friar, MD;  Location[facility] ENDOSCOPY;  Service: Endoscopy;  Laterality: N/A;   ESOPHAGOGASTRODUODENOSCOPY (EGD) WITH PROPOFOL N/A 08/26/2022   Procedure: ESOPHAGOGASTRODUODENOSCOPY (EGD) WITH PROPOFOL;  Surgeon: Sherrilyn Rist, MD;  Location: [facility] ENDOSCOPY;  Service: Gastroenterology;  Laterality:  N/A;   HAND SURGERY     LEFT HEART CATH AND CORONARY ANGIOGRAPHY N/A 02/11/2017   Procedure: Left Heart Cath and Coronary Angiography;  Surgeon: Runell Gess, MD;  Location: [facility] INVASIVE CV LAB;  Service: Cardiovascular;  Laterality: N/A;   LEFT HEART CATH AND CORONARY ANGIOGRAPHY N/A 06/28/2019   Procedure: LEFT HEART CATH AND CORONARY ANGIOGRAPHY;  Surgeon: Runell Gess, MD;  Location: [facility] INVASIVE CV LAB;  Service: Cardiovascular;  Laterality: N/A;   NOSE SURGERY  08/11/2019   removal of melenoma    Family History   Problem Relation Age of Onset   Heart failure Father    Coronary artery disease Sister 50   Cancer Neg Hx    COPD Neg Hx    Stroke Neg Hx    Kidney disease Neg Hx    Hypertension Neg Hx    Hyperlipidemia Neg Hx    Heart disease Neg Hx    Hearing loss Neg Hx    Early death Neg Hx    Drug abuse Neg Hx    Diabetes Neg Hx    Depression Neg Hx    Social History   Socioeconomic History   Marital status: Married    Spouse name: Rinaldo Cloud   Number of children: Not on file   Years of education: Not on file   Highest education level: Master's degree (e.g., MA, MS, MEng, MEd, MSW, MBA)  Occupational History   Occupation: retired  Tobacco Use   Smoking status: Some Days    Types: Cigars    Passive exposure: Current   Smokeless tobacco: Never   Tobacco comments:    Smokes cigars - rarely  Vaping Use   Vaping status: Never Used  Substance and Sexual Activity   Alcohol use: Yes    Alcohol/week: 5.0 standard drinks of alcohol    Types: 5 Shots of liquor per week    Comment: occassional   Drug use: No   Sexual activity: Yes  Other Topics Concern   Not on file  Social History Narrative   Lives in 2 story home with wife   Children live nearby   Social Drivers of Health   Financial Resource Strain: Low Risk  (02/25/2024)   Overall Financial Resource Strain (CARDIA)    Difficulty of Paying Living Expenses: Not very hard  Food Insecurity: No Food Insecurity (02/25/2024)   Hunger Vital Sign    Worried About Running Out of Food in the Last Year: Never true    Ran Out of Food in the Last Year: Never true  Transportation Needs: No Transportation Needs (02/25/2024)   PRAPARE - Administrator, Civil Service (Medical): No    Lack of Transportation (Non-Medical): No  Physical Activity: Sufficiently Active (02/25/2024)   Exercise Vital Sign    Days of Exercise per Week: 3 days    Minutes of Exercise per Session: 50 min  Stress: No Stress Concern Present (02/25/2024)   [employer] of [employer]ational [employer] - Occupational Stress Questionnaire    Feeling of Stress : Not at all  Social Connections: Moderately Integrated (02/25/2024)   Social Connection and Isolation Panel [NHANES]    Frequency of Communication with Friends and Family: More than three times a week    Frequency of Social Gatherings with Friends and Family: Once a week    Attends Religious Services: 1 to 4 times per year    Active Member of [employer] or Organizations: No    Attends Banker  Meetings: Not on file    Marital Status: Married    Tobacco Counseling Ready to quit: No Counseling given: No Tobacco comments: Smokes cigars - rarely    Clinical Intake:  Pre-visit preparation completed: Yes  Pain : No/denies pain     BMI - recorded: 29.32 Nutritional Status: BMI 25 -29 Overweight Nutritional Risks: None Diabetes: No  Lab Results  Component Value Date   HGBA1C 5.5 10/15/2016   HGBA1C 5.8 (H) 05/24/2015   HGBA1C 5.8 (H) 05/23/2015     How often do you need to have someone help you when you read instructions, pamphlets, or other written materials from your doctor or pharmacy?: 1 - Never  Interpreter Needed?: No  Information entered by :: Hassell Halim, CMA   Activities of Daily Living     02/25/2024    1:22 PM  In your present state of health, do you have any difficulty performing the following activities:  Hearing? 0  Vision? 0  Difficulty concentrating or making decisions? 0  Walking or climbing stairs? 0  Dressing or bathing? 0  Doing errands, shopping? 0  Preparing Food and eating ? N  Using the Toilet? N  In the past six months, have you accidently leaked urine? N  Do you have problems with loss of bowel control? N  Managing your Medications? N  Managing your Finances? N  Housekeeping or managing your Housekeeping? N    Patient Care Team: Etta Grandchild, MD as PCP - General (Internal Medicine) Runell Gess, MD as PCP - Cardiology  (Cardiology) Szabat, Vinnie Level, [facility] (Inactive) as Pharmacist (Pharmacist) Dermatology, Marikay Alar, Casimiro Needle, MD as Consulting Physician (Ophthalmology)  Indicate any recent Medical Services you may have received from other than [facility] providers in the past year (date may be approximate).     Assessment:   This is a routine wellness examination for Joy.  Hearing/Vision screen Hearing Screening - Comments:: Denies hearing difficulties   Vision Screening - Comments:: Sees Dr Burgess Estelle   Goals Addressed               This Visit's Progress     Patient Stated (pt-stated)        Patient stated he plans to continue exercising.       Depression Screen     02/25/2024    2:38 PM 09/18/2023    2:50 PM 09/02/2023   10:43 AM 02/24/2023   11:36 AM 12/23/2022    8:09 AM 09/04/2022    9:25 AM 02/22/2022    1:36 PM  PHQ 2/9 Scores  PHQ - 2 Score 0 0 0 0 0 0 0  PHQ- 9 Score 0 0 0 0 0      Fall Risk     02/25/2024    1:22 PM 09/18/2023    2:50 PM 02/24/2023   11:47 AM 02/20/2023    7:48 PM 02/22/2022    1:33 PM  Fall Risk   Falls in the past year? 1 0 1 1 0  Number falls in past yr: 0 0 0 0 0  Injury with Fall? 0 0 1 1 0  Risk for fall due to :  No Fall Risks   No Fall Risks  Follow up Falls evaluation completed;Falls prevention discussed Falls evaluation completed   Falls prevention discussed    MEDICARE RISK AT HOME:  Medicare Risk at Home Any stairs in or around the home?: (Patient-Rptd) Yes If so, are there any without handrails?: (Patient-Rptd) No Home free  of loose throw rugs in walkways, pet beds, electrical cords, etc?: (Patient-Rptd) Yes Adequate lighting in your home to reduce risk of falls?: (Patient-Rptd) Yes Life alert?: (Patient-Rptd) No Use of a cane, walker or w/c?: (Patient-Rptd) No Grab bars in the bathroom?: (Patient-Rptd) Yes Shower chair or bench in shower?: (Patient-Rptd) No Elevated toilet seat or a handicapped toilet?: (Patient-Rptd) Yes  TIMED UP  AND GO:  Was the test performed?  No  Cognitive Function: 6CIT completed        02/25/2024    2:46 PM 02/24/2023   11:37 AM 02/22/2022    1:41 PM  6CIT Screen  What Year? 0 points 0 points 0 points  What month? 0 points 0 points 0 points  What time? 0 points 0 points 0 points  Count back from 20 0 points 0 points 0 points  Months in reverse 0 points 0 points 0 points  Repeat phrase 0 points 0 points 0 points  Total Score 0 points 0 points 0 points    Immunizations Immunization History  Administered Date(s) Administered   Fluad Quad(high Dose 65+) 07/27/2019, 09/07/2020, 08/20/2022   Fluad Trivalent(High Dose 65+) 09/02/2023   Influenza Split 09/25/2012   Influenza, High Dose Seasonal PF 10/15/2016, 10/16/2017, 09/09/2018, 09/17/2021   Influenza,inj,Quad PF,6+ Mos 10/18/2013   Moderna Covid-19 Fall Seasonal Vaccine 41yrs & older 09/26/2022   PFIZER(Purple Top)SARS-COV-2 Vaccination 01/09/2020, 02/02/2020, 10/15/2020   Pneumococcal Conjugate-13 05/31/2015   Pneumococcal Polysaccharide-23 06/18/2013, 12/14/2018   Td 11/15/2008   Tdap 07/06/2019   Zoster Recombinant(Shingrix) 11/01/2020, 01/16/2021   Zoster, Live 07/16/2013    Screening Tests Health Maintenance  Topic Date Due   Medicare Annual Wellness (AWV)  02/24/2025   DTaP/Tdap/Td (3 - Td or Tdap) 07/05/2029   Pneumonia Vaccine 8+ Years old  Completed   INFLUENZA VACCINE  Completed   Hepatitis C Screening  Completed   Zoster Vaccines- Shingrix  Completed   HPV VACCINES  Aged Out   Colonoscopy  Discontinued   COVID-19 Vaccine  Discontinued   Fecal DNA (Cologuard)  Discontinued    Health Maintenance  There are no preventive care reminders to display for this patient. Health Maintenance Items Addressed:02/25/2024  Additional Screening:  Vision Screening: Recommended annual ophthalmology exams for early detection of glaucoma and other disorders of the eye.  Dental Screening: Recommended annual dental exams  for proper oral hygiene  Community Resource Referral / Chronic Care Management: CRR required this visit?  No   CCM required this visit?  No     Plan:     I have personally reviewed and noted the following in the patient's chart:   Medical and social history Use of alcohol, tobacco or illicit drugs  Current medications and supplements including opioid prescriptions. Patient is not currently taking opioid prescriptions. Functional ability and status Nutritional status Physical activity Advanced directives List of other physicians Hospitalizations, surgeries, and ER visits in previous 12 months Vitals Screenings to include cognitive, depression, and falls Referrals and appointments  In addition, I have reviewed and discussed with patient certain preventive protocols, quality metrics, and best practice recommendations. A written personalized care plan for preventive services as well as general preventive health recommendations were provided to patient.     Darreld Mclean, CMA   02/25/2024   After Visit Summary: (In Person-Declined) Patient declined AVS at this time.  Notes: Nothing significant to report at this time.

## 2024-02-25 NOTE — Patient Instructions (Addendum)
 Mr. John Mccarthy , Thank you for taking time to come for your Medicare Wellness Visit. I appreciate your ongoing commitment to your health goals. Please review the following plan we discussed and let me know if I can assist you in the future.   Referrals/Orders/Follow-Ups/Clinician Recommendations: Aim for 30 minutes of exercise or brisk walking, 6-8 glasses of water, and 5 servings of fruits and vegetables each day.   This is a list of the screening recommended for you and due dates:  Health Maintenance  Topic Date Due   Medicare Annual Wellness Visit  02/24/2025   DTaP/Tdap/Td vaccine (3 - Td or Tdap) 07/05/2029   Pneumonia Vaccine  Completed   Flu Shot  Completed   Hepatitis C Screening  Completed   Zoster (Shingles) Vaccine  Completed   HPV Vaccine  Aged Out   Colon Cancer Screening  Discontinued   COVID-19 Vaccine  Discontinued   Cologuard (Stool DNA test)  Discontinued    Advanced directives: (Declined) Advance directive discussed with you today. Even though you declined this today, please call our office should you change your mind, and we can give you the proper paperwork for you to fill out.  Next Medicare Annual Wellness Visit scheduled for next year: Yes

## 2024-03-02 ENCOUNTER — Ambulatory Visit (INDEPENDENT_AMBULATORY_CARE_PROVIDER_SITE_OTHER): Payer: BLUE CROSS/BLUE SHIELD | Admitting: Internal Medicine

## 2024-03-02 ENCOUNTER — Encounter: Payer: Self-pay | Admitting: Internal Medicine

## 2024-03-02 VITALS — BP 124/68 | HR 58 | Temp 97.8°F | Resp 16 | Ht 72.0 in | Wt 212.2 lb

## 2024-03-02 DIAGNOSIS — E785 Hyperlipidemia, unspecified: Secondary | ICD-10-CM

## 2024-03-02 DIAGNOSIS — H43393 Other vitreous opacities, bilateral: Secondary | ICD-10-CM | POA: Insufficient documentation

## 2024-03-02 DIAGNOSIS — I255 Ischemic cardiomyopathy: Secondary | ICD-10-CM

## 2024-03-02 DIAGNOSIS — R001 Bradycardia, unspecified: Secondary | ICD-10-CM

## 2024-03-02 LAB — CBC WITH DIFFERENTIAL/PLATELET
Basophils Absolute: 0.1 10*3/uL (ref 0.0–0.1)
Basophils Relative: 0.7 % (ref 0.0–3.0)
Eosinophils Absolute: 0.1 10*3/uL (ref 0.0–0.7)
Eosinophils Relative: 1.5 % (ref 0.0–5.0)
HCT: 42.9 % (ref 39.0–52.0)
Hemoglobin: 14.5 g/dL (ref 13.0–17.0)
Lymphocytes Relative: 18.4 % (ref 12.0–46.0)
Lymphs Abs: 1.6 10*3/uL (ref 0.7–4.0)
MCHC: 33.8 g/dL (ref 30.0–36.0)
MCV: 90.4 fl (ref 78.0–100.0)
Monocytes Absolute: 0.7 10*3/uL (ref 0.1–1.0)
Monocytes Relative: 8.1 % (ref 3.0–12.0)
Neutro Abs: 6 10*3/uL (ref 1.4–7.7)
Neutrophils Relative %: 71.3 % (ref 43.0–77.0)
Platelets: 192 10*3/uL (ref 150.0–400.0)
RBC: 4.74 Mil/uL (ref 4.22–5.81)
RDW: 15.2 % (ref 11.5–15.5)
WBC: 8.4 10*3/uL (ref 4.0–10.5)

## 2024-03-02 LAB — BASIC METABOLIC PANEL WITH GFR
BUN: 17 mg/dL (ref 6–23)
CO2: 28 meq/L (ref 19–32)
Calcium: 9.1 mg/dL (ref 8.4–10.5)
Chloride: 104 meq/L (ref 96–112)
Creatinine, Ser: 1.13 mg/dL (ref 0.40–1.50)
GFR: 63.9 mL/min (ref >60.00)
Glucose, Bld: 111 mg/dL — ABNORMAL HIGH (ref 70–99)
Potassium: 4.3 meq/L (ref 3.5–5.1)
Sodium: 140 meq/L (ref 135–145)

## 2024-03-02 LAB — HEPATIC FUNCTION PANEL
ALT: 24 U/L (ref 0–53)
AST: 25 U/L (ref 0–37)
Albumin: 4.3 g/dL (ref 3.5–5.2)
Alkaline Phosphatase: 78 U/L (ref 39–117)
Bilirubin, Direct: 0.1 mg/dL (ref 0.0–0.3)
Total Bilirubin: 0.5 mg/dL (ref 0.2–1.2)
Total Protein: 7.1 g/dL (ref 6.0–8.3)

## 2024-03-02 LAB — TSH: TSH: 4.58 u[IU]/mL (ref 0.35–5.50)

## 2024-03-02 NOTE — Progress Notes (Unsigned)
 Subjective:  Patient ID: John Mccarthy, male    DOB: 10/01/1951  Age: 73 y.o. MRN: [id number]  CC: Coronary Artery Disease and Hyperlipidemia   HPI ACESON LABELL presents for f/up  ----  Discussed the use of AI scribe software for clinical note transcription with the patient, who gave verbal consent to proceed.  History of Present Illness   John Mccarthy is a 73 year old male who presents with concerns about low heart rate and recent visual disturbances.  He is concerned about a low heart rate but has no associated symptoms such as dizziness, lightheadedness, palpitations, or a sensation of being on the verge of falling. No symptoms of abnormal blood pressure, bleeding, or bruising. His last EKG was conducted in October of the previous year.  He experienced visual disturbances described as 'interesting floaters' resembling 'fireworks' the previous night, which occurred while feeling nauseous after consuming sardines and a root beer float. He did not vomit, and the visual disturbances resolved immediately. His last eye exam was in September of the previous year.  He has noticed two ganglion cysts on his hand, which he describes as 'Dupuytren's contractions.' These do not currently bother him, and he has not sought consultation with a hand surgeon.  He mentions a craving for root beer floats, which he finds unusual. No abdominal pain or other gastrointestinal symptoms.       Outpatient Medications Prior to Visit  Medication Sig Dispense Refill   atorvastatin (LIPITOR) 80 MG tablet TAKE 1 TABLET BY MOUTH EVERY DAY AT 6 PM 90 tablet 2   carvedilol (COREG) 6.25 MG tablet TAKE 1 TABLET BY MOUTH 2 TIMES DAILY WITH A MEAL. 180 tablet 3   clopidogrel (PLAVIX) 75 MG tablet TAKE 1 TABLET BY MOUTH EVERY DAY 90 tablet 3   nitroGLYCERIN (NITROSTAT) 0.4 MG SL tablet Place 1 tablet (0.4 mg total) under the tongue every 5 (five) minutes as needed for chest pain. 25 tablet 3   NON  FORMULARY Take 1 Scoop by mouth every other day.     NON FORMULARY Take 1 Scoop by mouth 2 (two) times daily.     OVER THE COUNTER MEDICATION Take 1 capsule by mouth 2 (two) times daily.     sertraline (ZOLOFT) 50 MG tablet Take 1 tablet (50 mg total) by mouth daily. 90 tablet 1   No facility-administered medications prior to visit.    ROS Review of Systems  Constitutional: Negative.  Negative for appetite change, fatigue and unexpected weight change.  HENT: Negative.    Eyes:  Positive for visual disturbance. Negative for pain, redness and itching.  Respiratory: Negative.  Negative for cough, chest tightness, shortness of breath and wheezing.   Cardiovascular:  Negative for chest pain, palpitations and leg swelling.  Gastrointestinal: Negative.  Negative for abdominal pain, blood in stool, constipation, diarrhea, nausea and vomiting.  Genitourinary: Negative.   Musculoskeletal:  Positive for arthralgias. Negative for myalgias.  Skin: Negative.   Neurological: Negative.  Negative for dizziness, weakness and light-headedness.  Hematological:  Negative for adenopathy. Does not bruise/bleed easily.  Psychiatric/Behavioral: Negative.      Objective:  BP 124/68 (BP Location: Right Arm, Patient Position: Sitting, Cuff Size: Normal)   Pulse (!) 58   Temp 97.8 F (36.6 C) (Oral)   Resp 16   Ht 6' (1.829 m)   Wt 212 lb 3.2 oz (96.3 kg)   SpO2 96%   BMI 28.78 kg/m   BP Readings from Last 3  Encounters:  03/02/24 124/68  02/25/24 128/70  10/13/23 110/60    Wt Readings from Last 3 Encounters:  03/02/24 212 lb 3.2 oz (96.3 kg)  02/25/24 216 lb 3.2 oz (98.1 kg)  10/13/23 206 lb (93.4 kg)    Physical Exam Vitals reviewed.  Constitutional:      Appearance: Normal appearance.  HENT:     Nose: Nose normal.     Mouth/Throat:     Mouth: Mucous membranes are moist.  Eyes:     General: No scleral icterus.    Conjunctiva/sclera: Conjunctivae normal.  Cardiovascular:     Rate and  Rhythm: Normal rate and regular rhythm.     Pulses: Normal pulses.     Heart sounds: No murmur heard.    No friction rub. No gallop.     Comments: EKG--- NSR, 61 bpm Low voltage Anteroseptal infarct pattern is old Unchanged  Pulmonary:     Effort: Pulmonary effort is normal.     Breath sounds: No stridor. No wheezing, rhonchi or rales.  Abdominal:     General: Abdomen is flat.     Palpations: There is no mass.     Tenderness: There is no abdominal tenderness. There is no guarding.     Hernia: No hernia is present.  Musculoskeletal:     Cervical back: Neck supple.     Right lower leg: No edema.     Left lower leg: No edema.  Skin:    General: Skin is warm and dry.  Neurological:     General: No focal deficit present.     Mental Status: He is alert. Mental status is at baseline.  Psychiatric:        Mood and Affect: Mood normal.        Behavior: Behavior normal.     Lab Results  Component Value Date   WBC 8.4 03/02/2024   HGB 14.5 03/02/2024   HCT 42.9 03/02/2024   PLT 192.0 03/02/2024   GLUCOSE 111 (H) 03/02/2024   CHOL 115 09/02/2023   TRIG 127.0 09/02/2023   HDL 43.70 09/02/2023   LDLDIRECT 146.0 10/15/2016   LDLCALC 46 09/02/2023   ALT 24 03/02/2024   AST 25 03/02/2024   NA 140 03/02/2024   K 4.3 03/02/2024   CL 104 03/02/2024   CREATININE 1.13 03/02/2024   BUN 17 03/02/2024   CO2 28 03/02/2024   TSH 4.58 03/02/2024   PSA 0.89 09/02/2023   INR 1.1 08/25/2022   HGBA1C 5.5 10/15/2016    VAS US CAROTID Result Date: 10/11/2023 Carotid Arterial Duplex Study Patient Name:  John Mccarthy [facility]  Date of Exam:   10/10/2023 Medical Rec #: [id number]            Accession #:    [id number] Date of Birth: [birth year]            Patient Gender: M Patient Age:   49 years Exam Location:  [facility] Procedure:      VAS US CAROTID Referring Phys: Oliver Barre --------------------------------------------------------------------------------  Indications:       Carotid artery disease  and patient reports dizziness and                    presyncopal episodes when he turns his head to the right x 2                    weeks. He had one syncopal episode after turning his head to  the right that led him to the ED on 09/12/2023. He states                    when the symptoms begin to occur he has "sensation" that                    starts right side of his head near the crown and radiates                    that the head into the right neck/shoulder area. He denies                    any other cerebrovascular symptom. Risk Factors:      Hyperlipidemia, no history of smoking, coronary artery                    disease. Comparison Study:  In 05/2015, a carotid duplex performed at [facility]                    showed velocities of 112/21 cm/s in the RICA and 145/26 cm/s                    in the LICA. Performing Technologist: Tyna Jaksch RVT  Examination Guidelines: A complete evaluation includes B-mode imaging, spectral Doppler, color Doppler, and power Doppler as needed of all accessible portions of each vessel. Bilateral testing is considered an integral part of a complete examination. Limited examinations for reoccurring indications may be performed as noted.  Right Carotid Findings: +----------+--------+--------+--------+----------------------+--------+           PSV cm/sEDV cm/sStenosisPlaque Description    Comments +----------+--------+--------+--------+----------------------+--------+ CCA Prox  92      20                                             +----------+--------+--------+--------+----------------------+--------+ CCA Distal89      21      <50%    heterogenous and focal         +----------+--------+--------+--------+----------------------+--------+ ICA Prox  77      17      1-39%   heterogenous                   +----------+--------+--------+--------+----------------------+--------+ ICA Distal84      33                                              +----------+--------+--------+--------+----------------------+--------+ ECA       106     9               heterogenous and focal         +----------+--------+--------+--------+----------------------+--------+ +----------+--------+-------+----------------+-------------------+           PSV cm/sEDV cmsDescribe        Arm Pressure (mmHG) +----------+--------+-------+----------------+-------------------+ [id number]            Multiphasic, WNL                    +----------+--------+-------+----------------+-------------------+ +---------+--------+--+--------+--+---------+ VertebralPSV cm/s41EDV cm/s10Antegrade +---------+--------+--+--------+--+---------+ Significant difference in velocities in the vertebral artery with head extension from the left to the right, and far right. Left Carotid Findings: +----------+--------+--------+--------+----------------------+--------+  PSV cm/sEDV cm/sStenosisPlaque Description    Comments +----------+--------+--------+--------+----------------------+--------+ CCA Prox  83      13                                    tortuous +----------+--------+--------+--------+----------------------+--------+ CCA Distal79      17                                             +----------+--------+--------+--------+----------------------+--------+ ICA Prox  112     21      1-39%   heterogenous                   +----------+--------+--------+--------+----------------------+--------+ ICA Mid   94      27              heterogenous          tortuous +----------+--------+--------+--------+----------------------+--------+ ICA Distal71      23                                    tortuous +----------+--------+--------+--------+----------------------+--------+ ECA       135     16              heterogenous and focal         +----------+--------+--------+--------+----------------------+--------+  +----------+--------+--------+----------------+-------------------+           PSV cm/sEDV cm/sDescribe        Arm Pressure (mmHG) +----------+--------+--------+----------------+-------------------+ [id number]              Multiphasic, WNL                    +----------+--------+--------+----------------+-------------------+ +---------+--------+--+--------+--+---------+ VertebralPSV cm/s59EDV cm/s14Antegrade +---------+--------+--+--------+--+---------+ No significant velocity difference in the vertebral artery with head extension to the right or left.  Summary: Right Carotid: Velocities in the right ICA are consistent with a 1-39% stenosis.                 Non-hemodynamically significant plaque <50% noted in the CCA. Left Carotid: Velocities in the left ICA are consistent with a 1-39% stenosis. Vertebrals:  Bilateral vertebral arteries demonstrate antegrade flow.              Significant decrease in velocities in the right vertebral artery              with head turning from the left to the right, and far right              position. Subclavians: Normal flow hemodynamics were seen in bilateral subclavian              arteries. *See table(s) above for measurements and observations.  Electronically signed by Julien Nordmann MD on 10/11/2023 at 10:06:09 PM.    Final     Assessment & Plan:  Bradycardia -     EKG 12-Lead -     Basic metabolic panel with GFR; Future -     TSH; Future  Ischemic cardiomyopathy -     Basic metabolic panel with GFR; Future -     CBC with Differential/Platelet; Future  Vitreous floaters of both eyes -     Ambulatory referral to Ophthalmology  Dyslipidemia, goal LDL below 70 -     Basic metabolic  panel with GFR; Future -     Hepatic function panel; Future     Follow-up: Return in about 6 months (around 09/01/2024).  Sanda Linger, MD

## 2024-03-02 NOTE — Patient Instructions (Signed)
Coronary Artery Disease, Male Coronary artery disease (CAD) is a condition in which the arteries that lead to the heart (coronary arteries) become narrow or blocked. The narrowing or blockage can lead to decreased blood flow to the heart. Prolonged reduced blood flow can cause a heart attack (myocardial infarction, or MI). This condition may also be called coronary heart disease. CAD is the most common type of heart disease, and heart disease is the leading cause of death in men. It is important to understand what causes CAD and how it is treated. What are the causes? CAD is most often caused by atherosclerosis. This is the buildup of fat and cholesterol (plaque) on the inside of the arteries. Over time, the plaque may narrow or block the artery, reducing blood flow to the heart. Plaque can also become weak and break off within a coronary artery and cause a sudden blockage. Other less common causes of CAD include: A blood clot or a piece of another substance that blocks the flow of blood in a coronary artery (embolism). A tearing of the artery (spontaneous coronary artery dissection). An enlargement of an artery (aneurysm). Inflammation (vasculitis) in the artery wall. What increases the risk? The following factors may make you more likely to develop this condition: Age. Men older than 59 years are at a greater risk of CAD. Family history of CAD. High blood pressure (hypertension). Diabetes. High cholesterol levels. Obesity. Other risk factors include: Tobacco use. Excessive alcohol use. Lack of exercise. A diet high in saturated and trans fats, such as fried food and processed meat. What are the signs or symptoms? Many people do not have any symptoms during the early stages of CAD. As the condition progresses, symptoms may include: Chest pain (angina). The pain can: Feel like crushing or squeezing, or like a tightness, pressure, fullness, or heaviness in the chest. Last more than a few  minutes or can stop and recur. The pain tends to get worse with exercise or stress and to fade with rest. Pain in the arms, neck, jaw, ear, or back. Unexplained heartburn or indigestion. Shortness of breath. Nausea or vomiting. Sudden light-headedness. Sudden cold sweats. Fluttering or fast heartbeat (palpitations). How is this diagnosed? This condition is diagnosed based on: Your family and medical history. A physical exam. Tests. These may include: A test to check the electrical signals in your heart (electrocardiogram). Exercise stress test. This looks for signs of blockage when the heart is stressed with exercise, such as running on a treadmill. Pharmacologic stress test. This test looks for signs of blockage when the heart is being stressed with a medicine. Blood tests to check levels of cardiac enzymes such as troponin and creatine kinase. Coronary angiogram. This is a procedure to look at the coronary arteries to see if there is any blockage. During this test, a dye is injected into your arteries so they appear on an X-ray. Coronary artery CT scan. This scan helps detect calcium deposits in your coronary arteries. Calcium deposits are an indicator of CAD. A test that uses sound waves to take a picture of your heart (echocardiogram). How is this treated? This condition may be treated by: Healthy lifestyle changes to reduce risk factors. Medicines such as: Antiplatelet medicines such as clopidogrel or aspirin. These help to prevent blood clots. Nitroglycerin. Blood pressure medicines. Cholesterol-lowering medicine. Coronary angioplasty and stenting. During this procedure, a thin, flexible tube is inserted through a blood vessel and into a blocked artery. A balloon or similar device on the  end of the tube is inflated to open up the artery. In some cases, a small, mesh tube (stent) is inserted into the artery to keep it open. Coronary artery bypass surgery. During this surgery, veins  or arteries from other parts of the body are used to create a bypass around the blockage and allow blood to reach your heart. Follow these instructions at home: Medicines Take over-the-counter and prescription medicines only as told by your health care provider. Do not take the following medicines unless your health care provider approves: NSAIDs, such as ibuprofen, naproxen, or celecoxib. Vitamin supplements that contain vitamin A, vitamin E, or both. Lifestyle Follow an exercise program approved by your health care provider. Ask your health care provider if cardiac rehab is appropriate. Maintain a healthy weight or lose weight as approved by your health care provider. Learn to manage stress or try to limit your stress. Ask your health care provider for suggestions if you need help. Get screened for depression and seek treatment, if needed. Do not use any products that contain nicotine or tobacco. These products include cigarettes, chewing tobacco, and vaping devices, such as e-cigarettes. If you need help quitting, ask your health care provider. Eating and drinking  Follow a heart-healthy diet. A dietitian can help educate you about healthy food options and changes. In general, eat plenty of fruits and vegetables, lean meats, and whole grains. Avoid foods high in: Sugar. Salt (sodium). Saturated fat, such as processed or fatty meat. Trans fat, such as fried foods. Use healthy cooking methods such as roasting, grilling, broiling, baking, poaching, steaming, or stir-frying. Do not drink alcohol if your health care provider tells you not to drink. If you drink alcohol: Limit how much you have to 0-2 drinks a day. Know how much alcohol is in your drink. In the [place] one drink equals one 12 oz bottle of beer (355 mL), one 5 oz glass of wine (148 mL), or one 1 oz glass of hard liquor (44 mL). General instructions Manage any other health conditions, such as high cholesterol, hypertension, and  diabetes. These conditions affect your heart. Your health care provider may ask you to monitor your blood pressure. Keep all follow-up visits. This is important. Get help right away if: You have pain in your chest, neck, ear, arm, jaw, stomach, or back that: Lasts more than a few minutes. Is recurring. Is not relieved by taking medicine under your tongue (sublingual nitroglycerin). You have profuse sweating without cause. You have unexplained: Heartburn or indigestion. Shortness of breath or difficulty breathing. Fluttering or fast heartbeat (palpitations). Nausea or vomiting. Fatigue or weakness. Feelings of nervousness or anxiety. You have sudden light-headedness or dizziness. You faint. These symptoms may be an emergency. Get help right away. Call 911. Do not wait to see if the symptoms will go away. Do not drive yourself to the hospital. Summary Coronary artery disease (CAD) is a condition in which the arteries that lead to the heart (coronary arteries) become narrow or blocked. Prolonged reduced blood flow can cause a heart attack. CAD can be treated with lifestyle changes, medicines, coronary angioplasty or stents, coronary artery bypass surgery, or a combination of these treatments. Keep all follow-up visits. This is important. This information is not intended to replace advice given to you by your health care provider. Make sure you discuss any questions you have with your health care provider. Document Revised: 10/17/2021 Document Reviewed: 10/17/2021 Elsevier Patient Education  2024 [employer].

## 2024-05-03 ENCOUNTER — Other Ambulatory Visit: Payer: Self-pay | Admitting: Internal Medicine

## 2024-05-03 DIAGNOSIS — F3342 Major depressive disorder, recurrent, in full remission: Secondary | ICD-10-CM

## 2024-05-18 ENCOUNTER — Ambulatory Visit: Payer: BLUE CROSS/BLUE SHIELD | Attending: Cardiovascular Disease | Admitting: Cardiovascular Disease

## 2024-05-18 ENCOUNTER — Encounter: Payer: Self-pay | Admitting: Cardiovascular Disease

## 2024-05-18 VITALS — BP 130/86 | HR 64 | Ht 72.0 in | Wt 206.0 lb

## 2024-05-18 DIAGNOSIS — I255 Ischemic cardiomyopathy: Secondary | ICD-10-CM | POA: Insufficient documentation

## 2024-05-18 DIAGNOSIS — E785 Hyperlipidemia, unspecified: Secondary | ICD-10-CM | POA: Insufficient documentation

## 2024-05-18 DIAGNOSIS — Z9861 Coronary angioplasty status: Secondary | ICD-10-CM | POA: Insufficient documentation

## 2024-05-18 DIAGNOSIS — I2510 Atherosclerotic heart disease of native coronary artery without angina pectoris: Secondary | ICD-10-CM | POA: Diagnosis not present

## 2024-05-18 NOTE — Progress Notes (Signed)
 05/18/2024 John Mccarthy John Mccarthy   [birth year]  [id number]  Primary Physician Arcadio Knuckles, MD Primary Cardiologist: Avanell Leigh MD Bennye Bravo, [place]  HPI:  John Mccarthy is a 73 y.o.  mildly overweight married Caucasian male  father of 2 grandfather of 4 grandchildren who is retired as a Environmental consultant for heavy Social worker. I last saw him in the office 09/15/2023.    He had anterior STEMI  on 02/11/17. He is brought to the cath lab emergently and radial cath by myself revealing an ostial LAD occlusion which I stented with a synergy (3 mm x 12 mm) drug-eluting stent. He did have a 60% proximal to mid dominant RCA stenosis which I elected to treat medically. EF was 50% with anteroapical wall motion abnormality.   His door to balloon time was 22 minutes.  Follow-up 2-D echocardiogram performed 06/24/17 revealed normalization of LV function.  He was on dual antiplatelet therapy including aspirin  and Plavix  until recently when he discontinued the Plavix  which is appropriate.  He is asymptomatic back playing tennis several times a week including singles tennis 1-1/2 hours at a time without limitation.    I last saw him in the hospital this past July when he presented with an anterior STEMI with occlusion of the previously placed stent.  I intervened on him 06/28/2019 and placed 3 additional stents in his proximal LAD.  His durable in time was 46 minutes, delayed because of hypotension and reperfusion arrhythmias.  He did have a 99% ostial first diagonal branch stenosis which was jailed vessel and up in the previously placed stents as well as a 70% mid dominant RCA stenosis that was stable.  His EF was 45 to 50% with anteroapical hypokinesia.  He is asymptomatic.  He is out walking every day, and is currently participating in cardiac rehab. He remains on dual antiplatelet therapy as well as high-dose statin therapy.  He checks his blood pressure at home religiously which runs in the  110/70 range.   He was admitted to the hospital 08/25/2022 with a upper GI bleed.  He underwent endoscopy revealing ulcer.  He bled down to hemoglobin of 6.  His hemoglobin currently is 12.9.  Aspirin  discontinued.  He did describe dyspnea at that time.  He remains on clopidogrel  and denies chest pain.   He had an episode of syncope on 09/12/2023 and was evaluated in the emergency room.  Apparently he was orthostatic.  His workup was unremarkable.  His symptoms did respond to IV hydration.  Event monitoring showed no evidence of arrhythmias that would have been causative.  He said no recurrent symptoms.  Since I saw him 8 months ago history stable.  He is active and completely asymptomatic.   Current Meds  Medication Sig   atorvastatin  (LIPITOR ) 80 MG tablet TAKE 1 TABLET BY MOUTH EVERY DAY AT 6 PM   carvedilol  (COREG ) 6.25 MG tablet TAKE 1 TABLET BY MOUTH 2 TIMES DAILY WITH A MEAL.   clopidogrel  (PLAVIX ) 75 MG tablet TAKE 1 TABLET BY MOUTH EVERY DAY   nitroGLYCERIN  (NITROSTAT ) 0.4 MG SL tablet Place 1 tablet (0.4 mg total) under the tongue every 5 (five) minutes as needed for chest pain.   NON FORMULARY Take 1 Scoop by mouth every other day.   NON FORMULARY Take 1 Scoop by mouth 2 (two) times daily.   OVER THE COUNTER MEDICATION Take 1 capsule by mouth 2 (two) times daily.   sertraline  (ZOLOFT ) 50  MG tablet TAKE 1 TABLET BY MOUTH EVERY DAY     Allergies  Allergen Reactions   Crestor  [Rosuvastatin  Calcium ] Other (See Comments)    Muscle aches   Lactose Intolerance (Gi) Diarrhea    Social History   Socioeconomic History   Marital status: Married    Spouse name: Dina Francisco   Number of children: Not on file   Years of education: Not on file   Highest education level: Master's degree (e.g., MA, MS, MEng, MEd, MSW, MBA)  Occupational History   Occupation: retired  Tobacco Use   Smoking status: Some Days    Types: Cigars    Passive exposure: Current   Smokeless tobacco: Never    Tobacco comments:    Smokes cigars - rarely  Vaping Use   Vaping status: Never Used  Substance and Sexual Activity   Alcohol use: Yes    Alcohol/week: 5.0 standard drinks of alcohol    Types: 5 Shots of liquor per week    Comment: occassional   Drug use: No   Sexual activity: Yes  Other Topics Concern   Not on file  Social History Narrative   Lives in 2 story home with wife   Children live nearby   Social Drivers of Health   Financial Resource Strain: Low Risk  (02/25/2024)   Overall Financial Resource Strain (CARDIA)    Difficulty of Paying Living Expenses: Not very hard  Food Insecurity: No Food Insecurity (02/25/2024)   Hunger Vital Sign    Worried About Running Out of Food in the Last Year: Never true    Ran Out of Food in the Last Year: Never true  Transportation Needs: No Transportation Needs (02/25/2024)   PRAPARE - Administrator, Civil Service (Medical): No    Lack of Transportation (Non-Medical): No  Physical Activity: Sufficiently Active (02/25/2024)   Exercise Vital Sign    Days of Exercise per Week: 3 days    Minutes of Exercise per Session: 50 min  Stress: No Stress Concern Present (02/25/2024)   [employer] of Occupational Health - Occupational Stress Questionnaire    Feeling of Stress : Not at all  Social Connections: Moderately Integrated (02/25/2024)   Social Connection and Isolation Panel    Frequency of Communication with Friends and Family: More than three times a week    Frequency of Social Gatherings with Friends and Family: Once a week    Attends Religious Services: 1 to 4 times per year    Active Member of [employer] or Organizations: No    Attends Engineer, structural: Not on file    Marital Status: Married  Catering manager Violence: Not At Risk (02/25/2024)   Humiliation, Afraid, Rape, and Kick questionnaire    Fear of Current or Ex-Partner: No    Emotionally Abused: No    Physically Abused: No    Sexually Abused: No      Review of Systems: General: negative for chills, fever, night sweats or weight changes.  Cardiovascular: negative for chest pain, dyspnea on exertion, edema, orthopnea, palpitations, paroxysmal nocturnal dyspnea or shortness of breath Dermatological: negative for rash Respiratory: negative for cough or wheezing Urologic: negative for hematuria Abdominal: negative for nausea, vomiting, diarrhea, bright red blood per rectum, melena, or hematemesis Neurologic: negative for visual changes, syncope, or dizziness All other systems reviewed and are otherwise negative except as noted above.    Blood pressure 130/86, pulse 64, height 6' (1.829 m), weight 206 lb (93.4 kg), SpO2 95%.  General appearance: alert and no distress Neck: no adenopathy, no carotid bruit, no JVD, supple, symmetrical, trachea midline, and thyroid  not enlarged, symmetric, no tenderness/mass/nodules Lungs: clear to auscultation bilaterally Heart: regular rate and rhythm, S1, S2 normal, no murmur, click, rub or gallop Extremities: extremities normal, atraumatic, no cyanosis or edema Pulses: 2+ and symmetric Skin: Skin color, texture, turgor normal. No rashes or lesions Neurologic: Grossly normal  EKG not performed today      ASSESSMENT AND PLAN:   Dyslipidemia, goal LDL below 70 History of hyperlipidemia on high-dose statin therapy with lipid profile performed 09/02/2023 revealing total cholesterol 115, LDL 46 and HDL 43.  CAD S/P percutaneous coronary angioplasty History of CAD status post anterior STEMI 02/11/2017.  Refer him to the Cath Lab ostial LAD occlusion which I stented with a 3 mm x 12 mm long Synergy drug-eluting stent.  He did have a 60% proximal to mid dominant RCA stenosis which I elected to treat medically.  He returned with another anterior STEMI 06/28/2019 with occlusion of his previously placed stent.  Placed 3 additional stents in his proximal LAD.  1 time during his prior STEMI was 22 minutes and during  his second was 46 minutes.  He has remained on clopidogrel .  Aspirin  was discontinued because of GI bleed 08/25/2022.  He is completely asymptomatic.  Ischemic cardiomyopathy History of ischemic cardiomyopathy with EF that had normalized by his last echo 12/08/2019.  Did have grade 2 diastolic dysfunction with no evidence of valvular abnormalities.  His EF and wall motion had improved since his prior echo.     Avanell Leigh MD FACP,FACC,FAHA, [facility] 05/18/2024 8:39 AM

## 2024-05-18 NOTE — Assessment & Plan Note (Signed)
 History of CAD status post anterior STEMI 02/11/2017.  Refer him to the Cath Lab ostial LAD occlusion which I stented with a 3 mm x 12 mm long Synergy drug-eluting stent.  He did have a 60% proximal to mid dominant RCA stenosis which I elected to treat medically.  He returned with another anterior STEMI 06/28/2019 with occlusion of his previously placed stent.  Placed 3 additional stents in his proximal LAD.  1 time during his prior STEMI was 22 minutes and during his second was 46 minutes.  He has remained on clopidogrel .  Aspirin  was discontinued because of GI bleed 08/25/2022.  He is completely asymptomatic.

## 2024-05-18 NOTE — Assessment & Plan Note (Signed)
 History of hyperlipidemia on high-dose statin therapy with lipid profile performed 09/02/2023 revealing total cholesterol 115, LDL 46 and HDL 43.

## 2024-05-18 NOTE — Patient Instructions (Signed)

## 2024-05-18 NOTE — Assessment & Plan Note (Signed)
 History of ischemic cardiomyopathy with EF that had normalized by his last echo 12/08/2019.  Did have grade 2 diastolic dysfunction with no evidence of valvular abnormalities.  His EF and wall motion had improved since his prior echo.

## 2024-07-21 DIAGNOSIS — L570 Actinic keratosis: Secondary | ICD-10-CM | POA: Diagnosis not present

## 2024-07-21 DIAGNOSIS — D225 Melanocytic nevi of trunk: Secondary | ICD-10-CM | POA: Diagnosis not present

## 2024-07-21 DIAGNOSIS — L820 Inflamed seborrheic keratosis: Secondary | ICD-10-CM | POA: Diagnosis not present

## 2024-07-21 DIAGNOSIS — Z85828 Personal history of other malignant neoplasm of skin: Secondary | ICD-10-CM | POA: Diagnosis not present

## 2024-07-21 DIAGNOSIS — D2272 Melanocytic nevi of left lower limb, including hip: Secondary | ICD-10-CM | POA: Diagnosis not present

## 2024-07-21 DIAGNOSIS — L814 Other melanin hyperpigmentation: Secondary | ICD-10-CM | POA: Diagnosis not present

## 2024-08-04 DIAGNOSIS — H353132 Nonexudative age-related macular degeneration, bilateral, intermediate dry stage: Secondary | ICD-10-CM | POA: Diagnosis not present

## 2024-08-04 DIAGNOSIS — H52203 Unspecified astigmatism, bilateral: Secondary | ICD-10-CM | POA: Diagnosis not present

## 2024-08-04 DIAGNOSIS — H2513 Age-related nuclear cataract, bilateral: Secondary | ICD-10-CM | POA: Diagnosis not present

## 2024-08-04 DIAGNOSIS — H43813 Vitreous degeneration, bilateral: Secondary | ICD-10-CM | POA: Diagnosis not present

## 2024-09-04 ENCOUNTER — Other Ambulatory Visit: Payer: Self-pay | Admitting: Cardiovascular Disease

## 2024-09-12 DIAGNOSIS — Z23 Encounter for immunization: Secondary | ICD-10-CM | POA: Diagnosis not present

## 2024-09-21 ENCOUNTER — Telehealth: Payer: Self-pay | Admitting: Cardiovascular Disease

## 2024-09-21 NOTE — Telephone Encounter (Signed)
 RX sent in

## 2024-09-21 NOTE — Telephone Encounter (Signed)
*  STAT* If patient is at the pharmacy, call can be transferred to refill team.   1. Which medications need to be refilled? (please list name of each medication and dose if known) atorvastatin  (LIPITOR ) 80 MG tablet    2. Would you like to learn more about the convenience, safety, & potential cost savings by using the [facility]?     3. Are you open to using the [facility] (Type [facility].   ).   4. Which pharmacy/location (including street and city if local pharmacy) is medication to be sent to? [id number] - [place], [place] - [address] AT [address] OF [address]    5. Do they need a 30 day or 90 day supply? 90 day

## 2024-10-27 ENCOUNTER — Other Ambulatory Visit: Payer: Self-pay | Admitting: Internal Medicine

## 2024-10-27 DIAGNOSIS — F3342 Major depressive disorder, recurrent, in full remission: Secondary | ICD-10-CM

## 2025-03-03 ENCOUNTER — Encounter: Admitting: Internal Medicine

## 2025-03-03 ENCOUNTER — Ambulatory Visit
